# Patient Record
Sex: Male | Born: 1950
Health system: Southern US, Community
[De-identification: ages and names within clinical notes are randomized; demographics above are authoritative.]

## PROBLEM LIST (undated history)

## (undated) DIAGNOSIS — I639 Cerebral infarction, unspecified: Secondary | ICD-10-CM

## (undated) DIAGNOSIS — I4892 Unspecified atrial flutter: Secondary | ICD-10-CM

## (undated) DIAGNOSIS — I5042 Chronic combined systolic (congestive) and diastolic (congestive) heart failure: Secondary | ICD-10-CM

## (undated) DIAGNOSIS — I428 Other cardiomyopathies: Secondary | ICD-10-CM

## (undated) DIAGNOSIS — I10 Essential (primary) hypertension: Secondary | ICD-10-CM

## (undated) DIAGNOSIS — I480 Paroxysmal atrial fibrillation: Secondary | ICD-10-CM

## (undated) DIAGNOSIS — Z9114 Patient's other noncompliance with medication regimen: Secondary | ICD-10-CM

## (undated) DIAGNOSIS — Z91148 Patient's other noncompliance with medication regimen for other reason: Secondary | ICD-10-CM

## (undated) DIAGNOSIS — I272 Pulmonary hypertension, unspecified: Secondary | ICD-10-CM

## (undated) DIAGNOSIS — K922 Gastrointestinal hemorrhage, unspecified: Secondary | ICD-10-CM

## (undated) DIAGNOSIS — N183 Chronic kidney disease, stage 3 unspecified: Secondary | ICD-10-CM

## (undated) HISTORY — DX: Gastrointestinal hemorrhage, unspecified: K92.2

## (undated) HISTORY — PX: CORONARY ANGIOPLASTY: SHX604

## (undated) HISTORY — PX: CARDIAC CATHETERIZATION: SHX172

## (undated) HISTORY — DX: Chronic kidney disease, stage 3 unspecified: N18.30

---

## 2017-04-29 ENCOUNTER — Encounter: Payer: Self-pay | Admitting: Emergency Medicine

## 2017-04-29 ENCOUNTER — Inpatient Hospital Stay
Admission: EM | Admit: 2017-04-29 | Discharge: 2017-05-01 | DRG: 308 | Disposition: A | Discharge status: Home / Self Care | Payer: Medicare HMO | Attending: Internal Medicine | Admitting: Internal Medicine

## 2017-04-29 ENCOUNTER — Other Ambulatory Visit: Payer: Self-pay

## 2017-04-29 ENCOUNTER — Emergency Department: Payer: Medicare HMO

## 2017-04-29 DIAGNOSIS — R946 Abnormal results of thyroid function studies: Secondary | ICD-10-CM | POA: Diagnosis present

## 2017-04-29 DIAGNOSIS — R7989 Other specified abnormal findings of blood chemistry: Secondary | ICD-10-CM | POA: Diagnosis present

## 2017-04-29 DIAGNOSIS — I2510 Atherosclerotic heart disease of native coronary artery without angina pectoris: Secondary | ICD-10-CM | POA: Diagnosis present

## 2017-04-29 DIAGNOSIS — I110 Hypertensive heart disease with heart failure: Secondary | ICD-10-CM | POA: Diagnosis not present

## 2017-04-29 DIAGNOSIS — R7303 Prediabetes: Secondary | ICD-10-CM | POA: Diagnosis present

## 2017-04-29 DIAGNOSIS — I428 Other cardiomyopathies: Secondary | ICD-10-CM | POA: Diagnosis present

## 2017-04-29 DIAGNOSIS — I440 Atrioventricular block, first degree: Secondary | ICD-10-CM | POA: Diagnosis present

## 2017-04-29 DIAGNOSIS — R69 Illness, unspecified: Secondary | ICD-10-CM | POA: Diagnosis not present

## 2017-04-29 DIAGNOSIS — I4892 Unspecified atrial flutter: Principal | ICD-10-CM | POA: Diagnosis present

## 2017-04-29 DIAGNOSIS — I272 Pulmonary hypertension, unspecified: Secondary | ICD-10-CM | POA: Diagnosis not present

## 2017-04-29 DIAGNOSIS — Z87891 Personal history of nicotine dependence: Secondary | ICD-10-CM

## 2017-04-29 DIAGNOSIS — I420 Dilated cardiomyopathy: Secondary | ICD-10-CM | POA: Diagnosis not present

## 2017-04-29 DIAGNOSIS — R0602 Shortness of breath: Secondary | ICD-10-CM | POA: Diagnosis not present

## 2017-04-29 DIAGNOSIS — I5021 Acute systolic (congestive) heart failure: Secondary | ICD-10-CM | POA: Diagnosis present

## 2017-04-29 DIAGNOSIS — N179 Acute kidney failure, unspecified: Secondary | ICD-10-CM | POA: Diagnosis not present

## 2017-04-29 DIAGNOSIS — I361 Nonrheumatic tricuspid (valve) insufficiency: Secondary | ICD-10-CM | POA: Diagnosis not present

## 2017-04-29 DIAGNOSIS — I248 Other forms of acute ischemic heart disease: Secondary | ICD-10-CM | POA: Diagnosis present

## 2017-04-29 DIAGNOSIS — R778 Other specified abnormalities of plasma proteins: Secondary | ICD-10-CM | POA: Diagnosis present

## 2017-04-29 DIAGNOSIS — F17200 Nicotine dependence, unspecified, uncomplicated: Secondary | ICD-10-CM | POA: Diagnosis not present

## 2017-04-29 DIAGNOSIS — R748 Abnormal levels of other serum enzymes: Secondary | ICD-10-CM | POA: Diagnosis not present

## 2017-04-29 DIAGNOSIS — N189 Chronic kidney disease, unspecified: Secondary | ICD-10-CM | POA: Diagnosis not present

## 2017-04-29 DIAGNOSIS — R0603 Acute respiratory distress: Secondary | ICD-10-CM | POA: Diagnosis not present

## 2017-04-29 DIAGNOSIS — R079 Chest pain, unspecified: Secondary | ICD-10-CM | POA: Diagnosis not present

## 2017-04-29 DIAGNOSIS — Z720 Tobacco use: Secondary | ICD-10-CM | POA: Diagnosis not present

## 2017-04-29 HISTORY — DX: Unspecified atrial flutter: I48.92

## 2017-04-29 LAB — GLUCOSE, CAPILLARY: Glucose-Capillary: 158 mg/dL — ABNORMAL HIGH (ref 65–99)

## 2017-04-29 LAB — COMPREHENSIVE METABOLIC PANEL
ALBUMIN: 3.5 g/dL (ref 3.5–5.0)
ALT: 61 U/L (ref 17–63)
AST: 45 U/L — AB (ref 15–41)
Alkaline Phosphatase: 87 U/L (ref 38–126)
Anion gap: 12 (ref 5–15)
BUN: 28 mg/dL — AB (ref 6–20)
CHLORIDE: 106 mmol/L (ref 101–111)
CO2: 19 mmol/L — AB (ref 22–32)
Calcium: 9.2 mg/dL (ref 8.9–10.3)
Creatinine, Ser: 1.4 mg/dL — ABNORMAL HIGH (ref 0.61–1.24)
GFR calc Af Amer: 59 mL/min — ABNORMAL LOW (ref >60)
GFR calc non Af Amer: 51 mL/min — ABNORMAL LOW (ref >60)
Glucose, Bld: 107 mg/dL — ABNORMAL HIGH (ref 65–99)
POTASSIUM: 4.2 mmol/L (ref 3.5–5.1)
SODIUM: 137 mmol/L (ref 135–145)
Total Bilirubin: 0.8 mg/dL (ref 0.3–1.2)
Total Protein: 7.4 g/dL (ref 6.5–8.1)

## 2017-04-29 LAB — CBC WITH DIFFERENTIAL/PLATELET
BASOS PCT: 1 %
Basophils Absolute: 0.1 10*3/uL (ref 0–0.1)
EOS ABS: 0.1 10*3/uL (ref 0–0.7)
EOS PCT: 1 %
HCT: 46.3 % (ref 40.0–52.0)
Hemoglobin: 15.8 g/dL (ref 13.0–18.0)
LYMPHS ABS: 1.7 10*3/uL (ref 1.0–3.6)
Lymphocytes Relative: 35 %
MCH: 30.8 pg (ref 26.0–34.0)
MCHC: 34.1 g/dL (ref 32.0–36.0)
MCV: 90.4 fL (ref 80.0–100.0)
MONO ABS: 0.8 10*3/uL (ref 0.2–1.0)
MONOS PCT: 17 %
Neutro Abs: 2.3 10*3/uL (ref 1.4–6.5)
Neutrophils Relative %: 46 %
Platelets: 257 10*3/uL (ref 150–440)
RBC: 5.12 MIL/uL (ref 4.40–5.90)
RDW: 15.2 % — ABNORMAL HIGH (ref 11.5–14.5)
WBC: 4.9 10*3/uL (ref 3.8–10.6)

## 2017-04-29 LAB — BRAIN NATRIURETIC PEPTIDE: B NATRIURETIC PEPTIDE 5: 1558 pg/mL — AB (ref 0.0–100.0)

## 2017-04-29 LAB — TROPONIN I: TROPONIN I: 1.5 ng/mL — AB (ref <0.03)

## 2017-04-29 MED ORDER — DEXTROSE 5 % IV SOLN
5.0000 mg/h | INTRAVENOUS | Status: DC
  Administered 2017-04-30: 15 mg/h via INTRAVENOUS
  Filled 2017-04-29: qty 100

## 2017-04-29 MED ORDER — DILTIAZEM HCL 25 MG/5ML IV SOLN
5.0000 mg | Freq: Once | INTRAVENOUS | Status: AC
  Administered 2017-04-29: 5 mg via INTRAVENOUS

## 2017-04-29 MED ORDER — HEPARIN (PORCINE) IN NACL 100-0.45 UNIT/ML-% IJ SOLN
1200.0000 [IU]/h | INTRAMUSCULAR | Status: DC
  Administered 2017-04-30: 1200 [IU]/h via INTRAVENOUS
  Filled 2017-04-29 (×2): qty 250

## 2017-04-29 MED ORDER — ENOXAPARIN SODIUM 40 MG/0.4ML ~~LOC~~ SOLN: 40.0000 mg | SUBCUTANEOUS | Status: DC

## 2017-04-29 MED ORDER — ADENOSINE 6 MG/2ML IV SOLN
6.0000 mg | Freq: Once | INTRAVENOUS | Status: AC
  Administered 2017-04-29: 6 mg via INTRAVENOUS
  Filled 2017-04-29: qty 2

## 2017-04-29 MED ORDER — FUROSEMIDE 10 MG/ML IJ SOLN: 40.0000 mg | Freq: Once | INTRAMUSCULAR | Status: DC

## 2017-04-29 MED ORDER — AMIODARONE HCL IN DEXTROSE 360-4.14 MG/200ML-% IV SOLN
30.0000 mg/h | INTRAVENOUS | Status: DC
  Administered 2017-04-30: 30 mg/h via INTRAVENOUS

## 2017-04-29 MED ORDER — HEPARIN BOLUS VIA INFUSION
4000.0000 [IU] | Freq: Once | INTRAVENOUS | Status: DC
  Filled 2017-04-29: qty 4000

## 2017-04-29 MED ORDER — ACETAMINOPHEN 650 MG RE SUPP: 650.0000 mg | Freq: Four times a day (QID) | RECTAL | Status: DC | PRN

## 2017-04-29 MED ORDER — HEPARIN BOLUS VIA INFUSION
2000.0000 [IU] | Freq: Once | INTRAVENOUS | Status: DC
  Filled 2017-04-29: qty 2000

## 2017-04-29 MED ORDER — AMIODARONE HCL IN DEXTROSE 360-4.14 MG/200ML-% IV SOLN
60.0000 mg/h | INTRAVENOUS | Status: DC
  Administered 2017-04-29 – 2017-04-30 (×2): 60 mg/h via INTRAVENOUS
  Filled 2017-04-29 (×2): qty 200

## 2017-04-29 MED ORDER — AMIODARONE LOAD VIA INFUSION
150.0000 mg | Freq: Once | INTRAVENOUS | Status: AC
  Administered 2017-04-29: 150 mg via INTRAVENOUS
  Filled 2017-04-29: qty 83.34

## 2017-04-29 MED ORDER — ACETAMINOPHEN 325 MG PO TABS: 650.0000 mg | ORAL_TABLET | Freq: Four times a day (QID) | ORAL | Status: DC | PRN

## 2017-04-29 MED ORDER — ONDANSETRON HCL 4 MG/2ML IJ SOLN: 4.0000 mg | Freq: Four times a day (QID) | INTRAMUSCULAR | Status: DC | PRN

## 2017-04-29 MED ORDER — DILTIAZEM HCL 25 MG/5ML IV SOLN
INTRAVENOUS | Status: AC
  Filled 2017-04-29: qty 5

## 2017-04-29 MED ORDER — DILTIAZEM HCL 100 MG IV SOLR
5.0000 mg/h | Freq: Once | INTRAVENOUS | Status: AC
  Administered 2017-04-29: 5 mg/h via INTRAVENOUS
  Filled 2017-04-29: qty 100

## 2017-04-29 MED ORDER — ONDANSETRON HCL 4 MG PO TABS: 4.0000 mg | ORAL_TABLET | Freq: Four times a day (QID) | ORAL | Status: DC | PRN

## 2017-04-29 NOTE — ED Notes (Signed)
amiodarone given to floor RN to administer

## 2017-04-29 NOTE — ED Notes (Signed)
Date and time results received: 04/29/17 Test:Troponin Critical Value:1.50  Name of Provider Notified: Jannifer Franklin  Orders Received? Or Actions Taken?: MD notified

## 2017-04-29 NOTE — ED Notes (Addendum)
Lab called to state redraw necessary for troponin

## 2017-04-29 NOTE — ED Notes (Signed)
Lab contacted due to time delay regarding troponin resulting, lab states need another redraw, RN requested lab collect redraw

## 2017-04-29 NOTE — ED Notes (Signed)
MD requested increase of diltiazem to 15 mg/hr

## 2017-04-29 NOTE — Consult Note (Signed)
Name: Angelica Wix. MRN: [id number] DOB: 1950-09-07    ADMISSION DATE:  04/29/2017 CONSULTATION DATE: 04/29/2017  REFERRING MD : Dr. Jannifer Franklin   CHIEF COMPLAINT: Shortness of Breath   BRIEF PATIENT DESCRIPTION:  67 yo male admitted with elevated troponin and new onset atrial flutter requiring amiodarone, cardizem, and heparin gtts  SIGNIFICANT EVENTS  02/12-Pt admitted to stepdown unit   STUDIES:  None   HISTORY OF PRESENT ILLNESS:   This is a 67 yo male with PMH of ETOH Abuse (drinks 1-3 forty ounces of beer daily) and Former Smoker he stopped smoking 1 week prior to presentation to the ER.  According to pt he does not have a PCP, his last examination was during childhood.   He presented to [facility] ER 02/12 with c/o worsening shortness of breath with mild anterior chest soreness onset of symptoms 1 week ago.  Per ER notes the pt states while ambulating short distances he developed shortness of breath.  In the ER EKG revealed atrial flutter with hr 144 and slight left axis nonspecific ST-T wave changes, CXR revealed possible mild pulmonary venous congestion, and BNP 1,558. He received 5 mg iv diltiazem x3 doses and 6 mg adenosine x1 dose without improvement of heart rate, therefore cardizem gtt initiated.  However, despite interventions heart rate remained elevated and amiodarone gtt added. Lab results revealed creatinine 1.40, troponin 1.50, and BNP 1,558 therefore heparin gtt ordered. He was subsequently admitted to the stepdown unit by hospitalist team for further workup and treatment.    PAST MEDICAL HISTORY :   has a past medical history of Atrial flutter ([place]).  has no past surgical history on file. Prior to Admission medications   Not on File   No Known Allergies  FAMILY HISTORY:  family history is not on file. SOCIAL HISTORY:  reports that he has quit smoking. he has never used smokeless tobacco. He reports that he does not drink alcohol.  REVIEW OF SYSTEMS: Positives in BOLD   Constitutional: Negative for fever, chills, weight loss, malaise/fatigue and diaphoresis.  HENT: Negative for hearing loss, ear pain, nosebleeds, congestion, sore throat, neck pain, tinnitus and ear discharge.   Eyes: Negative for blurred vision, double vision, photophobia, pain, discharge and redness.  Respiratory: cough, hemoptysis, sputum production, exertional shortness of breath, wheezing and stridor.   Cardiovascular: intermittent chest pain, palpitations, orthopnea, claudication, leg swelling and PND.  Gastrointestinal: Negative for heartburn, nausea, vomiting, abdominal pain, diarrhea, constipation, blood in stool and melena.  Genitourinary: Negative for dysuria, urgency, frequency, hematuria and flank pain.  Musculoskeletal: Negative for myalgias, back pain, joint pain and falls.  Skin: Negative for itching and rash.  Neurological: dizziness, tingling, tremors, sensory change, speech change, focal weakness, seizures, loss of consciousness, weakness and headaches.  Endo/Heme/Allergies: Negative for environmental allergies and polydipsia. Does not bruise/bleed easily.  SUBJECTIVE:  No complaints at this time   VITAL SIGNS: Temp:  [97.8 F (36.6 C)] 97.8 F (36.6 C) (02/12 1814) Pulse Rate:  [131-144] 132 (02/12 2110) Resp:  [17-28] 17 (02/12 2140) BP: (138-171)/(104-149) 153/104 (02/12 2140) SpO2:  [95 %-99 %] 98 % (02/12 2110) Weight:  [106.6 kg (235 lb)] 106.6 kg (235 lb) (02/12 1812)  PHYSICAL EXAMINATION: General: well developed, well nourished male NAD  Neuro: alert and oriented, follows commands  HEENT: supple, no JVD  Cardiovascular: atrial flutter, no M/R/G Lungs: clear throughout, even, non labored  Abdomen: +BS x4, soft, non tender, non distended  Musculoskeletal: normal bulk and tone, no edema  Skin: intact no rashes or lesions   No results for input(s): NA, K, CL, CO2, BUN, CREATININE, GLUCOSE in the last 168 hours. Recent Labs  Lab 04/29/17 1837  HGB 15.8    HCT 46.3  WBC 4.9  PLT 257   Dg Chest Portable 1 View  Result Date: 04/29/2017 CLINICAL DATA:  Exertional shortness of breath with chest pain. EXAM: PORTABLE CHEST 1 VIEW COMPARISON:  None. FINDINGS: Multiple leads, wires, and external pacer/defibrillator projecting over the chest. Patient rotated minimally right. Midline trachea. Moderate cardiomegaly. No pleural effusion or pneumothorax. Low lung volumes with resultant pulmonary interstitial prominence. Suspect concurrent mild pulmonary venous congestion. No overt congestive failure or pulmonary consolidation. IMPRESSION: Cardiomegaly and low lung volumes. Suspect mild pulmonary venous congestion. Decreased sensitivity and specificity exam due to technique related factors, as described above. Electronically Signed   By: Abigail Miyamoto M.D.   On: 04/29/2017 18:51    ASSESSMENT / PLAN: New Onset Atrial Flutter  Elevated troponin's secondary to demand ischemia vs. NSTEMI  Acute respiratory failure likely in the setting of atrial flutter Acute renal failure  Hypertension  Hx: ETOH Abuse and Former Smoker  P: Supplemental O2 for hypoxia and/or dyspnea  Prn CXR  Prn bronchodilator therapy  Continuous telemetry monitoring  Trend troponin's Echo pending  Continue heparin, amiodarone, and cardizem gtts  Cardiology consulted appreciate input Prn hydralazine for bp management  Trend CBC  Monitor for s/sx of bleeding and transfuse for hgb <7 Trend BMP  Replace electrolytes as indicated  Monitor UOP CIWA protocol  Continue folic acid, mvi, and thiamine   Marda Stalker, [place] Pager [phone] (please enter 7 digits) PCCM Consult Pager [phone] (please enter 7 digits)

## 2017-04-29 NOTE — ED Triage Notes (Signed)
States he stopped smoking about 1 week ago  Since has had exertional SOB with occasional  chest pain  No fever or cough

## 2017-04-29 NOTE — ED Provider Notes (Signed)
[facility] Emergency Department Provider Note   ____________________________________________   First MD Initiated Contact with Patient 04/29/17 1831     (approximate)  I have reviewed the triage vital signs and the nursing notes.   HISTORY  Chief Complaint Shortness of Breath and Pleurisy    HPI Nathan [name]. is a 67 y.o. male Patient reports she stopped smoking a week ago and since then has been having increasing amounts of shortness of breath. He has occasional sore episodes one or 2 seconds worth of minimal chest discomfort in his chest anteriorly but that said. He says he can't walk very far without getting very short of breath however. He has no fever no cough no achiness   History reviewed. No pertinent past medical history.  There are no active problems to display for this patient.   History reviewed. No pertinent surgical history.  Prior to Admission medications   Not on File    Allergies Patient has no known allergies.  History reviewed. No pertinent family history.  Social History Social History   Tobacco Use  . Smoking status: Former Research scientist (life sciences)  . Smokeless tobacco: Never Used  Substance Use Topics  . Alcohol use: No    Frequency: Never  . Drug use: Not on file    Review of Systems  Constitutional: No fever/chills Eyes: No visual changes. ENT: No sore throat. Cardiovascular: Dsee history of present illness Respiratory: see history of present illness Gastrointestinal: No abdominal pain.  No nausea, no vomiting.  No diarrhea.  No constipation. Genitourinary: Negative for dysuria. Musculoskeletal: Negative for back pain. Skin: Negative for rash. Neurological: Negative for headaches, focal weakness  ____________________________________________   PHYSICAL EXAM:  VITAL SIGNS: ED Triage Vitals  Enc Vitals Group     BP 04/29/17 1814 (!) 156/109     Pulse Rate 04/29/17 1814 (!) 144     Resp 04/29/17 1814 20     Temp  04/29/17 1814 97.8 F (36.6 C)     Temp src --      SpO2 04/29/17 1814 98 %     Weight 04/29/17 1812 235 lb (106.6 kg)     Height 04/29/17 1812 6\' 5"  (1.956 m)     Head Circumference --      Peak Flow --      Pain Score --      Pain Loc --      Pain Edu? --      Excl. in [place]? --     Constitutional: Alert and oriented. Well appearing and in no acute distress. Eyes: Conjunctivae are normalI. Head: Atraumatic. Nose: No congestion/rhinnorhea. Mouth/Throat: Mucous membranes are moist.  Oropharynx non-erythematous. Neck: No stridor.   Cardiovascular: rapid rate, regular rhythm. Grossly normal heart sounds.  Good peripheral circulation. Respiratory: Normal respiratory effort.  No retractions. Lungs CTAB. Gastrointestinal: Soft and nontender. No distention. No abdominal bruits. No CVA tenderness. Musculoskeletal: No lower extremity tenderness nor edema.  No joint effusions. Neurologic:  Normal speech and language. No gross focal neurologic deficits are appreciated. No gait instability. Skin:  Skin is warm, dry and intact. No rash noted. Psychiatric: Mood and affect are normal. Speech and behavior are normal.  ____________________________________________   LABS (all labs ordered are listed, but only abnormal results are displayed)  Labs Reviewed  CBC WITH DIFFERENTIAL/PLATELET - Abnormal; Notable for the following components:      Result Value   RDW 15.2 (*)    All other components within normal limits  LACTIC  ACID, PLASMA  LACTIC ACID, PLASMA  BRAIN NATRIURETIC PEPTIDE   ____________________________________________  EKG  EKG read and interpreted by me shows what appears to be a flutter at a rate of 144 slight left axis nonspecific ST-T wave changes ____________________________________________  RADIOLOGY  ED MD interpretation: chest x-ray possible CHF   Official radiology report(s): Dg Chest Portable 1 View  Result Date: 04/29/2017 CLINICAL DATA:  Exertional shortness of  breath with chest pain. EXAM: PORTABLE CHEST 1 VIEW COMPARISON:  None. FINDINGS: Multiple leads, wires, and external pacer/defibrillator projecting over the chest. Patient rotated minimally right. Midline trachea. Moderate cardiomegaly. No pleural effusion or pneumothorax. Low lung volumes with resultant pulmonary interstitial prominence. Suspect concurrent mild pulmonary venous congestion. No overt congestive failure or pulmonary consolidation. IMPRESSION: Cardiomegaly and low lung volumes. Suspect mild pulmonary venous congestion. Decreased sensitivity and specificity exam due to technique related factors, as described above. Electronically Signed   By: Abigail Miyamoto M.D.   On: 04/29/2017 18:51    ____________________________________________   PROCEDURES  Procedure(s) performed:   Procedures  Critical Care performed:   ____________________________________________   INITIAL IMPRESSION / ASSESSMENT AND PLAN / ED COURSE   patient has had 4 doses of 5 mg diltiazem and rapid succession with really no marked changes heart rate. We'll put him on a drip. Lab work is still pending at this point.        ____________________________________________   FINAL CLINICAL IMPRESSION(S) / ED DIAGNOSES  Final diagnoses:  Atrial flutter, unspecified type [facility])     ED Discharge Orders    None       Note:  This document was prepared using Dragon voice recognition software and may include unintentional dictation errors.    Nena Polio, MD 04/29/17 2046

## 2017-04-29 NOTE — H&P (Signed)
[facility] [place] NAME: Nathan [name]    MR#:  [id number]  DATE OF BIRTH:  1950/12/25  DATE OF ADMISSION:  04/29/2017  PRIMARY CARE PHYSICIAN: Patient, [name]   REQUESTING/REFERRING PHYSICIAN: Cinda Quest, MD  CHIEF COMPLAINT:   Chief Complaint  Patient presents with  . Shortness of Breath  . Pleurisy    HISTORY OF PRESENT ILLNESS:  Nathan [name]  is a 67 y.o. male who presents with 1 week of dyspnea on exertion and palpitations.  Here in the ED he was found to be in a flutter with RVR.  He was given several doses of Cardizem and put on a Cardizem drip.  Hospitalist were called for admission  PAST MEDICAL HISTORY:   Past Medical History:  Diagnosis Date  . Atrial flutter ([place])     PAST SURGICAL HISTORY:  History reviewed. No pertinent surgical history.  SOCIAL HISTORY:   Social History   Tobacco Use  . Smoking status: Former Research scientist (life sciences)  . Smokeless tobacco: Never Used  Substance Use Topics  . Alcohol use: No    Frequency: Never    FAMILY HISTORY:  History reviewed. No pertinent family history.  DRUG ALLERGIES:  No Known Allergies  MEDICATIONS AT HOME:   Prior to Admission medications   Not on File    REVIEW OF SYSTEMS:  Review of Systems  Constitutional: Negative for chills, fever, malaise/fatigue and weight loss.  HENT: Negative for ear pain, hearing loss and tinnitus.   Eyes: Negative for blurred vision, double vision, pain and redness.  Respiratory: Positive for shortness of breath. Negative for cough and hemoptysis.   Cardiovascular: Positive for palpitations. Negative for chest pain, orthopnea and leg swelling.  Gastrointestinal: Negative for abdominal pain, constipation, diarrhea, nausea and vomiting.  Genitourinary: Negative for dysuria, frequency and hematuria.  Musculoskeletal: Negative for back pain, joint pain and neck pain.  Skin:       No acne, rash, or lesions  Neurological: Negative for  dizziness, tremors, focal weakness and weakness.  Endo/Heme/Allergies: Negative for polydipsia. Does not bruise/bleed easily.  Psychiatric/Behavioral: Negative for depression. The patient is not nervous/anxious and does not have insomnia.      VITAL SIGNS:   Vitals:   04/29/17 2020 04/29/17 2035 04/29/17 2040 04/29/17 2050  BP: (!) 148/119 (!) 171/128 (!) 164/132 (!) 165/120  Pulse: (!) 133     Resp: (!) 21 (!) 22 (!) 25 20  Temp:      SpO2: 99%     Weight:      Height:       Wt Readings from Last 3 Encounters:  04/29/17 106.6 kg (235 lb)    PHYSICAL EXAMINATION:  Physical Exam  Vitals reviewed. Constitutional: He is oriented to person, place, and time. He appears well-developed and well-nourished. No distress.  HENT:  Head: Normocephalic and atraumatic.  Mouth/Throat: Oropharynx is clear and moist.  Eyes: Conjunctivae and EOM are normal. Pupils are equal, round, and reactive to light. No scleral icterus.  Neck: Normal range of motion. Neck supple. No JVD present. No thyromegaly present.  Cardiovascular: Intact distal pulses. Exam reveals no gallop and no friction rub.  No murmur heard. Tachycardic, irregular  Respiratory: Effort normal and breath sounds normal. No respiratory distress. He has no wheezes. He has no rales.  GI: Soft. Bowel sounds are normal. He exhibits no distension. There is no tenderness.  Musculoskeletal: Normal range of motion. He exhibits no edema.  No arthritis, no  gout  Lymphadenopathy:    He has no cervical adenopathy.  Neurological: He is alert and oriented to person, place, and time. No cranial nerve deficit.  No dysarthria, no aphasia  Skin: Skin is warm and dry. No rash noted. No erythema.  Psychiatric: He has a normal mood and affect. His behavior is normal. Judgment and thought content normal.    LABORATORY PANEL:   CBC Recent Labs  Lab 04/29/17 1837  WBC 4.9  HGB 15.8  HCT 46.3  PLT 257    ------------------------------------------------------------------------------------------------------------------  Chemistries  No results for input(s): NA, K, CL, CO2, GLUCOSE, BUN, CREATININE, CALCIUM, MG, AST, ALT, ALKPHOS, BILITOT in the last 168 hours.  Invalid input(s): GFRCGP ------------------------------------------------------------------------------------------------------------------  Cardiac Enzymes No results for input(s): TROPONINI in the last 168 hours. ------------------------------------------------------------------------------------------------------------------  RADIOLOGY:  Dg Chest Portable 1 View  Result Date: 04/29/2017 CLINICAL DATA:  Exertional shortness of breath with chest pain. EXAM: PORTABLE CHEST 1 VIEW COMPARISON:  None. FINDINGS: Multiple leads, wires, and external pacer/defibrillator projecting over the chest. Patient rotated minimally right. Midline trachea. Moderate cardiomegaly. No pleural effusion or pneumothorax. Low lung volumes with resultant pulmonary interstitial prominence. Suspect concurrent mild pulmonary venous congestion. No overt congestive failure or pulmonary consolidation. IMPRESSION: Cardiomegaly and low lung volumes. Suspect mild pulmonary venous congestion. Decreased sensitivity and specificity exam due to technique related factors, as described above. Electronically Signed   By: Abigail Miyamoto M.D.   On: 04/29/2017 18:51    EKG:   Orders placed or performed during the hospital encounter of 04/29/17  . ED EKG  . ED EKG  . ED EKG  . ED EKG    IMPRESSION AND PLAN:  Principal Problem:   Atrial flutter with rapid ventricular response (HCC) -patient was given several doses of IV Cardizem, then started on a Cardizem drip.  His heart rate still did not control sufficiently, remaining consistently in the 130s on the highest dose of IV Cardizem.  Amiodarone drip was added.  We will also trend his cardiac enzymes, get cardiology consult and  echocardiogram Active Problems:   Elevated troponin -initial troponin was elevated greater than 1.  Is possibly due to persistent a flutter over an unknown number of days, however we will start him on a heparin drip tonight until we can trend his enzymes and get a cardiology consult and echocardiogram   AKI (acute kidney injury) ([place]) -unclear baseline, creatinine is elevated.  Patient reports no prior history of any significant medical problems.  Gentle IV fluids, avoid nephrotoxins, monitor for improvement.  All the records are reviewed and case discussed with ED provider. Management plans discussed with the patient and/or family.  DVT PROPHYLAXIS: SubQ lovenox  GI PROPHYLAXIS: None  ADMISSION STATUS: Inpatient  CODE STATUS: Full Code Status History    This patient does not have a recorded code status. Please follow your organizational policy for patients in this situation.      TOTAL TIME TAKING CARE OF THIS PATIENT: 45 minutes.   Nathan [name] 04/29/2017, 9:16 PM  [employer]  [phone]  CC: Primary care physician; Patient, [name]  Note:  This document was prepared using Dragon voice recognition software and may include unintentional dictation errors.

## 2017-04-29 NOTE — ED Notes (Signed)
Amiodarone given to floor RN to administer

## 2017-04-29 NOTE — ED Notes (Signed)
Attempted to call report, nurse not ready, states receiving RN will call back

## 2017-04-29 NOTE — ED Notes (Signed)
zole pads placed on pt

## 2017-04-30 ENCOUNTER — Other Ambulatory Visit: Payer: Self-pay

## 2017-04-30 ENCOUNTER — Inpatient Hospital Stay (HOSPITAL_COMMUNITY)
Admit: 2017-04-30 | Discharge: 2017-04-30 | Disposition: A | Discharge status: Home / Self Care | Payer: Medicare HMO | Attending: Physician Assistant | Admitting: Physician Assistant

## 2017-04-30 ENCOUNTER — Encounter: Payer: Self-pay | Admitting: Physician Assistant

## 2017-04-30 DIAGNOSIS — R748 Abnormal levels of other serum enzymes: Secondary | ICD-10-CM

## 2017-04-30 DIAGNOSIS — I248 Other forms of acute ischemic heart disease: Secondary | ICD-10-CM

## 2017-04-30 DIAGNOSIS — F17200 Nicotine dependence, unspecified, uncomplicated: Secondary | ICD-10-CM

## 2017-04-30 DIAGNOSIS — I361 Nonrheumatic tricuspid (valve) insufficiency: Secondary | ICD-10-CM

## 2017-04-30 DIAGNOSIS — R0603 Acute respiratory distress: Secondary | ICD-10-CM

## 2017-04-30 DIAGNOSIS — N179 Acute kidney failure, unspecified: Secondary | ICD-10-CM

## 2017-04-30 DIAGNOSIS — I4892 Unspecified atrial flutter: Principal | ICD-10-CM

## 2017-04-30 LAB — CBC
HCT: 42.1 % (ref 40.0–52.0)
HCT: 42.2 % (ref 40.0–52.0)
HEMOGLOBIN: 14.3 g/dL (ref 13.0–18.0)
HEMOGLOBIN: 14.5 g/dL (ref 13.0–18.0)
MCH: 31 pg (ref 26.0–34.0)
MCH: 31 pg (ref 26.0–34.0)
MCHC: 33.9 g/dL (ref 32.0–36.0)
MCHC: 34.3 g/dL (ref 32.0–36.0)
MCV: 90.3 fL (ref 80.0–100.0)
MCV: 91.3 fL (ref 80.0–100.0)
Platelets: 220 10*3/uL (ref 150–440)
Platelets: 232 10*3/uL (ref 150–440)
RBC: 4.63 MIL/uL (ref 4.40–5.90)
RBC: 4.67 MIL/uL (ref 4.40–5.90)
RDW: 15.5 % — AB (ref 11.5–14.5)
RDW: 15.5 % — ABNORMAL HIGH (ref 11.5–14.5)
WBC: 4.7 10*3/uL (ref 3.8–10.6)
WBC: 4.9 10*3/uL (ref 3.8–10.6)

## 2017-04-30 LAB — BASIC METABOLIC PANEL
ANION GAP: 9 (ref 5–15)
BUN: 25 mg/dL — ABNORMAL HIGH (ref 6–20)
CALCIUM: 9.2 mg/dL (ref 8.9–10.3)
CO2: 21 mmol/L — ABNORMAL LOW (ref 22–32)
Chloride: 110 mmol/L (ref 101–111)
Creatinine, Ser: 1.23 mg/dL (ref 0.61–1.24)
GFR, EST NON AFRICAN AMERICAN: 59 mL/min — AB (ref >60)
GLUCOSE: 105 mg/dL — AB (ref 65–99)
Potassium: 4.1 mmol/L (ref 3.5–5.1)
SODIUM: 140 mmol/L (ref 135–145)

## 2017-04-30 LAB — ECHOCARDIOGRAM COMPLETE
Height: 77 in
Weight: 3760.17 oz

## 2017-04-30 LAB — TROPONIN I
TROPONIN I: 1.22 ng/mL — AB (ref <0.03)
Troponin I: 1.12 ng/mL (ref <0.03)
Troponin I: 1.06 ng/mL (ref <0.03)

## 2017-04-30 LAB — APTT: aPTT: 28 seconds (ref 24–36)

## 2017-04-30 LAB — HEMOGLOBIN A1C
Hgb A1c MFr Bld: 5.7 % — ABNORMAL HIGH (ref 4.8–5.6)
MEAN PLASMA GLUCOSE: 116.89 mg/dL

## 2017-04-30 LAB — HEPARIN LEVEL (UNFRACTIONATED)
HEPARIN UNFRACTIONATED: 0.32 [IU]/mL (ref 0.30–0.70)
HEPARIN UNFRACTIONATED: 0.34 [IU]/mL (ref 0.30–0.70)

## 2017-04-30 LAB — TSH: TSH: 7.441 u[IU]/mL — AB (ref 0.350–4.500)

## 2017-04-30 LAB — PROTIME-INR
INR: 1.13
Prothrombin Time: 14.4 seconds (ref 11.4–15.2)

## 2017-04-30 LAB — CREATININE, SERUM
Creatinine, Ser: 1.26 mg/dL — ABNORMAL HIGH (ref 0.61–1.24)
GFR, EST NON AFRICAN AMERICAN: 58 mL/min — AB (ref >60)

## 2017-04-30 LAB — MRSA PCR SCREENING: MRSA BY PCR: NEGATIVE

## 2017-04-30 LAB — MAGNESIUM: Magnesium: 2 mg/dL (ref 1.7–2.4)

## 2017-04-30 MED ORDER — METOPROLOL TARTRATE 25 MG PO TABS: 25.0000 mg | ORAL_TABLET | Freq: Four times a day (QID) | ORAL | Status: DC

## 2017-04-30 MED ORDER — APIXABAN 5 MG PO TABS
5.0000 mg | ORAL_TABLET | Freq: Two times a day (BID) | ORAL | Status: DC
  Administered 2017-04-30 – 2017-05-01 (×3): 5 mg via ORAL
  Filled 2017-04-30 (×3): qty 1

## 2017-04-30 MED ORDER — ADULT MULTIVITAMIN W/MINERALS CH
1.0000 | ORAL_TABLET | Freq: Every day | ORAL | Status: DC
  Administered 2017-04-30 – 2017-05-01 (×2): 1 via ORAL
  Filled 2017-04-30 (×2): qty 1

## 2017-04-30 MED ORDER — LORAZEPAM 2 MG/ML IJ SOLN: 2.0000 mg | INTRAMUSCULAR | Status: DC | PRN

## 2017-04-30 MED ORDER — DIGOXIN 0.25 MG/ML IJ SOLN
0.5000 mg | Freq: Once | INTRAMUSCULAR | Status: AC
  Administered 2017-04-30: 0.5 mg via INTRAVENOUS
  Filled 2017-04-30: qty 2

## 2017-04-30 MED ORDER — HYDRALAZINE HCL 20 MG/ML IJ SOLN
10.0000 mg | INTRAMUSCULAR | Status: DC | PRN
  Administered 2017-04-30 (×3): 10 mg via INTRAVENOUS
  Filled 2017-04-30 (×2): qty 1

## 2017-04-30 MED ORDER — DILTIAZEM HCL 30 MG PO TABS
60.0000 mg | ORAL_TABLET | Freq: Three times a day (TID) | ORAL | Status: DC
  Administered 2017-04-30 – 2017-05-01 (×3): 60 mg via ORAL
  Filled 2017-04-30: qty 2
  Filled 2017-04-30: qty 1
  Filled 2017-04-30: qty 2

## 2017-04-30 MED ORDER — METOPROLOL TARTRATE 50 MG PO TABS
50.0000 mg | ORAL_TABLET | Freq: Four times a day (QID) | ORAL | Status: DC
  Administered 2017-04-30 – 2017-05-01 (×3): 50 mg via ORAL
  Filled 2017-04-30 (×4): qty 1

## 2017-04-30 MED ORDER — FOLIC ACID 1 MG PO TABS
1.0000 mg | ORAL_TABLET | Freq: Every day | ORAL | Status: DC
  Administered 2017-04-30 – 2017-05-01 (×2): 1 mg via ORAL
  Filled 2017-04-30 (×2): qty 1

## 2017-04-30 MED ORDER — IPRATROPIUM-ALBUTEROL 0.5-2.5 (3) MG/3ML IN SOLN: 3.0000 mL | Freq: Four times a day (QID) | RESPIRATORY_TRACT | Status: DC | PRN

## 2017-04-30 MED ORDER — HEPARIN BOLUS VIA INFUSION
4000.0000 [IU] | Freq: Once | INTRAVENOUS | Status: AC
  Administered 2017-04-30: 4000 [IU] via INTRAVENOUS
  Filled 2017-04-30: qty 4000

## 2017-04-30 MED ORDER — VITAMIN B-1 100 MG PO TABS
100.0000 mg | ORAL_TABLET | Freq: Every day | ORAL | Status: DC
  Administered 2017-04-30 – 2017-05-01 (×2): 100 mg via ORAL
  Filled 2017-04-30 (×2): qty 1

## 2017-04-30 NOTE — Progress Notes (Signed)
ANTICOAGULATION CONSULT NOTE - Initial Consult  Pharmacy Consult for eliquis  Indication: atrial flutter with rvr  No Known Allergies  Patient Measurements: Height: 6\' 5"  (195.6 cm) Weight: 235 lb 0.2 oz (106.6 kg) IBW/kg (Calculated) : 89.1 Heparin Dosing Weight:   Vital Signs: Temp: 98.2 F (36.8 C) (02/13 0800) Temp Source: Oral (02/13 0800) BP: 139/83 (02/13 1412) Pulse Rate: 80 (02/13 1412)  Labs: Recent Labs    04/29/17 1837 04/29/17 1941 04/30/17 0003 04/30/17 0503 04/30/17 1100  HGB 15.8  --  14.5 14.3  --   HCT 46.3  --  42.1 42.2  --   PLT 257  --  232 220  --   APTT  --   --  28  --   --   LABPROT  --   --  14.4  --   --   INR  --   --  1.13  --   --   HEPARINUNFRC  --   --   --  0.32 0.34  CREATININE  --  1.40* 1.26* 1.23  --   TROPONINI  --  1.50* 1.22* 1.06*  1.12*  --     Estimated Creatinine Clearance: 74.5 mL/min (by C-G formula based on SCr of 1.23 mg/dL).   Medical History: Past Medical History:  Diagnosis Date  . Atrial flutter (HCC)     Medications:  No medications prior to admission.   Scheduled:  . apixaban  5 mg Oral BID  . diltiazem  60 mg Oral B3P  . folic acid  1 mg Oral Daily  . metoprolol tartrate  50 mg Oral Q6H  . multivitamin with minerals  1 tablet Oral Daily  . thiamine  100 mg Oral Daily    Assessment: Pharmacy consulted to dose and monitor apixaban for this 67 year old male being treated for atrial flutter. Patient previously on heparin gtt however MD Bridgett Larsson would like to transition to Eliquis  Goal of Therapy:   Monitor platelets by anticoagulation protocol: Yes   Plan:  Will discontinue Heparin gtt and will start apixaban 5 mg PO BID.   Deona Novitski D 04/30/2017,3:24 PM

## 2017-04-30 NOTE — Progress Notes (Signed)
[place] Progress Note Patient Name: Nathan [name]. DOB: [birth year] MRN: [id number]   Date of Service  04/30/2017  HPI/Events of Note  58 M presenting to ED with progressive SOB and DOE.  Recently stopped smoking.  Found to be in AF/RVR.  Placed initially on dilt gtt but continued to have AF and placed on Amio gtt.  Cards has been consulted.  Initial trop elevated at 1.5 and BNP elevated with some vascular congestion on CXR.  Currently the paitent is alert on [place] O2.  His HR is 128 with BP of 142/113.  He is in no resp distress  eICU Interventions  Plan of care per primary admitting team Continue to cycle trop Cont with Dilt/Amio gtt Now on heparin Continue to monitor via [facility]     Intervention Category Evaluation Type: New Patient Evaluation  DETERDING,ELIZABETH 04/30/2017, 12:18 AM

## 2017-04-30 NOTE — Progress Notes (Signed)
ANTICOAGULATION CONSULT NOTE - Initial Consult  Pharmacy Consult for heparin Indication: atrial fibrillation/ACS  No Known Allergies  Patient Measurements: Height: 6\' 5"  (195.6 cm) Weight: 235 lb 0.2 oz (106.6 kg) IBW/kg (Calculated) : 89.1 Heparin Dosing Weight: 106 kg  Vital Signs: Temp: 97.5 F (36.4 C) (02/13 0430) Temp Source: Oral (02/13 0430) BP: 144/112 (02/13 0600) Pulse Rate: 122 (02/13 0600)  Labs: Recent Labs    04/29/17 1837 04/29/17 1941 04/30/17 0003 04/30/17 0503  HGB 15.8  --  14.5 14.3  HCT 46.3  --  42.1 42.2  PLT 257  --  232 220  APTT  --   --  28  --   LABPROT  --   --  14.4  --   INR  --   --  1.13  --   HEPARINUNFRC  --   --   --  0.32  CREATININE  --  1.40* 1.26* 1.23  TROPONINI  --  1.50* 1.22* 1.12*    Estimated Creatinine Clearance: 74.5 mL/min (by C-G formula based on SCr of 1.23 mg/dL).   Medical History: Past Medical History:  Diagnosis Date  . Atrial flutter (HCC)     Medications:  Scheduled:  . folic acid  1 mg Oral Daily  . multivitamin with minerals  1 tablet Oral Daily  . thiamine  100 mg Oral Daily    Assessment: Patient admitted w/ CP found trops up to 1.50 being started on heparin drip. No PTA anticoagul  Goal of Therapy:  Heparin level 0.3-0.7 units/ml Monitor platelets by anticoagulation protocol: Yes   Plan:  Will bolus w/ heparin 4000 units IV x 1 Will start rate at 1200 units/hr  Baseline labs drawn Will check HL w/ am labs. Will monitor daily CBC's and adjust per HL's  02/13 @ 0500 HL 0.32 therapeutic. Will continue current rate and will recheck HL @ 1100. CBC stable.  Tobie Lords, PharmD, BCPS Clinical Pharmacist 04/30/2017

## 2017-04-30 NOTE — Progress Notes (Signed)
*  PRELIMINARY RESULTS* Echocardiogram 2D Echocardiogram has been performed.  Sherrie Sport 04/30/2017, 3:14 PM

## 2017-04-30 NOTE — Progress Notes (Signed)
ANTICOAGULATION CONSULT NOTE - Initial Consult  Pharmacy Consult for heparin Indication: atrial fibrillation/ACS  No Known Allergies  Patient Measurements: Height: 6\' 5"  (195.6 cm) Weight: 235 lb 0.2 oz (106.6 kg) IBW/kg (Calculated) : 89.1 Heparin Dosing Weight: 106 kg  Vital Signs: Temp: 97.6 F (36.4 C) (02/12 2337) Temp Source: Oral (02/12 2337) BP: 141/116 (02/13 0000) Pulse Rate: 126 (02/13 0000)  Labs: Recent Labs    04/29/17 1837 04/29/17 1941  HGB 15.8  --   HCT 46.3  --   PLT 257  --   CREATININE  --  1.40*  TROPONINI  --  1.50*    Estimated Creatinine Clearance: 65.4 mL/min (A) (by C-G formula based on SCr of 1.4 mg/dL (H)).   Medical History: Past Medical History:  Diagnosis Date  . Atrial flutter (HCC)     Medications:  Scheduled:  . folic acid  1 mg Oral Daily  . heparin  4,000 Units Intravenous Once  . multivitamin with minerals  1 tablet Oral Daily  . thiamine  100 mg Oral Daily    Assessment: Patient admitted w/ CP found trops up to 1.50 being started on heparin drip. No PTA anticoagul  Goal of Therapy:  Heparin level 0.3-0.7 units/ml Monitor platelets by anticoagulation protocol: Yes   Plan:  Will bolus w/ heparin 4000 units IV x 1 Will start rate at 1200 units/hr  Baseline labs drawn Will check HL w/ am labs. Will monitor daily CBC's and adjust per HL's  Tobie Lords, PharmD, BCPS Clinical Pharmacist 04/30/2017

## 2017-04-30 NOTE — Progress Notes (Signed)
ANTICOAGULATION CONSULT NOTE - Initial Consult  Pharmacy Consult for heparin Indication: atrial fibrillation/ACS  No Known Allergies  Patient Measurements: Height: 6\' 5"  (195.6 cm) Weight: 235 lb 0.2 oz (106.6 kg) IBW/kg (Calculated) : 89.1 Heparin Dosing Weight: 106 kg  Vital Signs: Temp: 98.2 F (36.8 C) (02/13 0800) Temp Source: Oral (02/13 0800) BP: 126/87 (02/13 1100) Pulse Rate: 85 (02/13 1100)  Labs: Recent Labs    04/29/17 1837 04/29/17 1941 04/30/17 0003 04/30/17 0503 04/30/17 1100  HGB 15.8  --  14.5 14.3  --   HCT 46.3  --  42.1 42.2  --   PLT 257  --  232 220  --   APTT  --   --  28  --   --   LABPROT  --   --  14.4  --   --   INR  --   --  1.13  --   --   HEPARINUNFRC  --   --   --  0.32 0.34  CREATININE  --  1.40* 1.26* 1.23  --   TROPONINI  --  1.50* 1.22* 1.06*  1.12*  --     Estimated Creatinine Clearance: 74.5 mL/min (by C-G formula based on SCr of 1.23 mg/dL).   Medical History: Past Medical History:  Diagnosis Date  . Atrial flutter (HCC)     Medications:  Scheduled:  . diltiazem  60 mg Oral J5K  . folic acid  1 mg Oral Daily  . metoprolol tartrate  50 mg Oral Q6H  . multivitamin with minerals  1 tablet Oral Daily  . thiamine  100 mg Oral Daily    Assessment: Patient admitted w/ CP found trops up to 1.50 being started on heparin drip. No PTA anticoagul  Goal of Therapy:  Heparin level 0.3-0.7 units/ml Monitor platelets by anticoagulation protocol: Yes   Plan:  Will bolus w/ heparin 4000 units IV x 1 Will start rate at 1200 units/hr  Baseline labs drawn Will check HL w/ am labs. Will monitor daily CBC's and adjust per HL's  02/13 @ 0500 HL 0.32 therapeutic. Will continue current rate and will recheck HL @ 1100. CBC stable.  2/13: Heparin level 0.34 which is therapeutic. Will recheck in am.  Tobie Lords, PharmD, BCPS Clinical Pharmacist 04/30/2017

## 2017-04-30 NOTE — Progress Notes (Signed)
[facility] [place] NAME: Nathan [name]    MR#:  [id number]  DATE OF BIRTH:  March 30, 1950  SUBJECTIVE:  CHIEF COMPLAINT:   Chief Complaint  Patient presents with  . Shortness of Breath  . Pleurisy   Patient feels better.  No palpitation no shortness of breath.  Off Cardizem and amiodarone drip. REVIEW OF SYSTEMS:  Review of Systems  Constitutional: Negative for chills, fever and malaise/fatigue.  HENT: Negative for sore throat.   Eyes: Negative for blurred vision and double vision.  Respiratory: Negative for cough, hemoptysis, shortness of breath, wheezing and stridor.   Cardiovascular: Negative for chest pain, palpitations, orthopnea and leg swelling.  Gastrointestinal: Negative for abdominal pain, blood in stool, diarrhea, melena, nausea and vomiting.  Genitourinary: Negative for dysuria, flank pain and hematuria.  Musculoskeletal: Negative for back pain and joint pain.  Skin: Negative for rash.  Neurological: Negative for dizziness, sensory change, focal weakness, seizures, loss of consciousness, weakness and headaches.  Endo/Heme/Allergies: Negative for polydipsia.  Psychiatric/Behavioral: Negative for depression. The patient is not nervous/anxious.     DRUG ALLERGIES:  No Known Allergies VITALS:  Blood pressure 139/83, pulse 80, temperature 98.2 F (36.8 C), temperature source Oral, resp. rate 14, height 6\' 5"  (1.956 m), weight 235 lb 0.2 oz (106.6 kg), SpO2 92 %. PHYSICAL EXAMINATION:  Physical Exam  Constitutional: He is oriented to person, place, and time and well-developed, well-nourished, and in no distress.  HENT:  Head: Normocephalic.  Mouth/Throat: Oropharynx is clear and moist.  Eyes: Conjunctivae and EOM are normal. Pupils are equal, round, and reactive to light. No scleral icterus.  Neck: Normal range of motion. Neck supple. No JVD present. No tracheal deviation present.  Cardiovascular: Normal rate, regular rhythm  and normal heart sounds. Exam reveals no gallop.  No murmur heard. Pulmonary/Chest: Effort normal and breath sounds normal. No respiratory distress. He has no wheezes. He has no rales.  Abdominal: Soft. Bowel sounds are normal. He exhibits no distension. There is no tenderness. There is no rebound.  Musculoskeletal: Normal range of motion. He exhibits no edema or tenderness.  Neurological: He is alert and oriented to person, place, and time. No cranial nerve deficit.  Skin: No rash noted. No erythema.  Psychiatric: Affect normal.   LABORATORY PANEL:  Male CBC Recent Labs  Lab 04/30/17 0503  WBC 4.9  HGB 14.3  HCT 42.2  PLT 220   ------------------------------------------------------------------------------------------------------------------ Chemistries  Recent Labs  Lab 04/29/17 1941  04/30/17 0503  NA 137  --  140  K 4.2  --  4.1  CL 106  --  110  CO2 19*  --  21*  GLUCOSE 107*  --  105*  BUN 28*  --  25*  CREATININE 1.40*   < > 1.23  CALCIUM 9.2  --  9.2  MG  --   --  2.0  AST 45*  --   --   ALT 61  --   --   ALKPHOS 87  --   --   BILITOT 0.8  --   --    < > = values in this interval not displayed.   RADIOLOGY:  Dg Chest Portable 1 View  Result Date: 04/29/2017 CLINICAL DATA:  Exertional shortness of breath with chest pain. EXAM: PORTABLE CHEST 1 VIEW COMPARISON:  None. FINDINGS: Multiple leads, wires, and external pacer/defibrillator projecting over the chest. Patient rotated minimally right. Midline trachea. Moderate cardiomegaly. No pleural effusion or  pneumothorax. Low lung volumes with resultant pulmonary interstitial prominence. Suspect concurrent mild pulmonary venous congestion. No overt congestive failure or pulmonary consolidation. IMPRESSION: Cardiomegaly and low lung volumes. Suspect mild pulmonary venous congestion. Decreased sensitivity and specificity exam due to technique related factors, as described above. Electronically Signed   By: Abigail Miyamoto M.D.    On: 04/29/2017 18:51   ASSESSMENT AND PLAN:     Atrial flutter with rapid ventricular response ([place]) -patient was given several doses of IV Cardizem, then started on a Cardizem drip.   He is off Cardizem and miodarone drip.  Heart rate is controlled. Cardiologist started p.o. Cardizem and lopressor, on heparin drip. could give bolus of digoxin for rate control. Eliquis 5 twice daily, follow-up echocardiogram. Plan on DC cardioversion in 1 month if able to rate control this hospitalization per Dr. Rockey Situ.    Elevated troponin -initial troponin was elevated greater than 1.  Is possibly due to persistent a flutter over an unknown number of days. Lexiscan Myoview prior to discharge, am.    AKI (acute kidney injury) ([place]) -unclear baseline, creatinine is elevated.  Patient reports no prior history of any significant medical problems.   Improved with IV fluid support.  Pulmonary edema.  Likely due to a flutter with RVR. Continue Lasix daily.  Tobacco abuse.  Smoking cessation was counseled for 3-4 minutes.  Nicotine patch.  All the records are reviewed and case discussed with Care Management/Social Worker. Management plans discussed with the patient, family and they are in agreement.  CODE STATUS: Full Code  TOTAL TIME TAKING CARE OF THIS PATIENT: 35 minutes.   More than 50% of the time was spent in counseling/coordination of care: YES  POSSIBLE D/C IN 1-2 DAYS, DEPENDING ON CLINICAL CONDITION.   Demetrios Loll M.D on 04/30/2017 at 2:27 PM  Between 7am to 6pm - Pager - [phone]  After 6pm go to [email or web address] - [facility]

## 2017-04-30 NOTE — Consult Note (Signed)
Cardiology Consultation:   Patient ID: Nathan [name].; [id number]; 1950/10/13   Admit date: 04/29/2017 Date of Consult: 04/30/2017  Primary Care Provider: Patient, [name] Primary Cardiologist: new to [facility] - consult by [place]   Patient Profile:   Nathan [name]. is a 67 y.o. male with a hx of tobacco abuse quitting 04/28/17 who is being seen today for the evaluation of new onset atrial flutter with RVR, elevated troponin, and acute CHF at the request of Dr. Jannifer Franklin.  History of Present Illness:   Mr. Weatherholtz has no known previously known cardiac history. He has not seen an MD as an outpatient in many years. Over the past 7-10 days, he has noted increased fatigue with associated SOB. No chest pain or palpitations. No LE swelling. He was assuming this was related to him attempting to quit tobacco over this time span. However, due to his persistent fatigue and SOB he presented to [facility].   Upon the patient's arrival to [facility] they were found to have BP 156/109, HR 144 bpm, temp afebrile, oxygen saturation 98% on room air, weight 235 pounds. EKG showed atrial flutter with RVR as detailed below, CXR showed cardiomegaly with mild congestion. Labs showed troponin 1.50-->1.22-->1.12, BNP 1,558, SCr 1.40-->1.23, K+ 4.2-->4.1, glucose 107, WBC 4.9, HGB 15.8, PLT 257. In the ED he was given adenosine, amiodarone infusion with bolus, Cardizem injections totaling 15 mg followed by diltiazem gtt, and hydralazine. He was also started on a heparin infusion. He has remained in atrial flutter with RVR with heart rates in the 120s bpm. Cardiology was asked to evaluate.     Past Medical History:  Diagnosis Date  . Atrial flutter ([place])     History reviewed. No pertinent surgical history.   Home Meds: Prior to Admission medications   Not on File    Inpatient Medications: Scheduled Meds: . folic acid  1 mg Oral Daily  . multivitamin with minerals  1 tablet Oral Daily  . thiamine  100 mg Oral Daily    Continuous Infusions: . amiodarone 30 mg/hr (04/30/17 0511)  . diltiazem (CARDIZEM) infusion 15 mg/hr (04/30/17 0601)  . heparin 1,200 Units/hr (04/30/17 0009)   PRN Meds: acetaminophen **OR** acetaminophen, hydrALAZINE, ipratropium-albuterol, LORazepam, ondansetron **OR** ondansetron (ZOFRAN) IV  Allergies:  No Known Allergies  Social History:   Social History   Socioeconomic History  . Marital status: Single    Spouse name: Not on file  . Number of children: Not on file  . Years of education: Not on file  . Highest education level: Not on file  Social Needs  . Financial resource strain: Not on file  . Food insecurity - worry: Not on file  . Food insecurity - inability: Not on file  . Transportation needs - medical: Not on file  . Transportation needs - non-medical: Not on file  Occupational History  . Not on file  Tobacco Use  . Smoking status: Former Research scientist (life sciences)  . Smokeless tobacco: Never Used  Substance and Sexual Activity  . Alcohol use: No    Frequency: Never  . Drug use: Not on file  . Sexual activity: Not on file  Other Topics Concern  . Not on file  Social History Narrative  . Not on file     Family History:   Family History  Problem Relation Age of Onset  . Alzheimer's disease Mother   . Alzheimer's disease Father     ROS:  Review of Systems  Constitutional: Positive  for malaise/fatigue. Negative for chills, diaphoresis, fever and weight loss.  HENT: Negative for congestion.   Eyes: Negative for discharge and redness.  Respiratory: Positive for shortness of breath. Negative for cough, hemoptysis, sputum production and wheezing.   Cardiovascular: Negative for chest pain, palpitations, orthopnea, claudication, leg swelling and PND.  Gastrointestinal: Negative for abdominal pain, blood in stool, heartburn, melena, nausea and vomiting.  Genitourinary: Negative for hematuria.  Musculoskeletal: Negative for falls and myalgias.  Skin: Negative for rash.   Neurological: Positive for weakness. Negative for dizziness, tingling, tremors, sensory change, speech change, focal weakness and loss of consciousness.  Endo/Heme/Allergies: Does not bruise/bleed easily.  Psychiatric/Behavioral: Negative for substance abuse. The patient is not nervous/anxious.   All other systems reviewed and are negative.     Physical Exam/Data:   Vitals:   04/30/17 0530 04/30/17 0600 04/30/17 0630 04/30/17 0700  BP: (!) 138/107 (!) 144/112 (!) 141/106 (!) 147/109  Pulse: (!) 124 (!) 122 (!) 124 (!) 122  Resp: (!) 29 13 (!) 29 18  Temp:      TempSrc:      SpO2: 97% 95% 94% 94%  Weight:      Height:        Intake/Output Summary (Last 24 hours) at 04/30/2017 0804 Last data filed at 04/30/2017 0600 Gross per 24 hour  Intake 414.95 ml  Output 825 ml  Net -410.05 ml   Filed Weights   04/29/17 1812 04/29/17 2337  Weight: 235 lb (106.6 kg) 235 lb 0.2 oz (106.6 kg)   Body mass index is 27.87 kg/m.   Physical Exam: General: Well developed, well nourished, in no acute distress. Head: Normocephalic, atraumatic, sclera non-icteric, no xanthomas, nares without discharge. Neck: Negative for carotid bruits. JVD not elevated. Lungs: Clear bilaterally to auscultation without wheezes, rales, or rhonchi. Breathing is unlabored. Heart: Tachycardic with S1 S2. No murmurs, rubs, or gallops appreciated. Abdomen: Soft, non-tender, non-distended with normoactive bowel sounds. No hepatomegaly. No rebound/guarding. No obvious abdominal masses. Msk:  Strength and tone appear normal for age. Extremities: No clubbing or cyanosis. No edema. Distal pedal pulses are 2+ and equal bilaterally. Neuro: Alert and oriented X 3. No facial asymmetry. No focal deficit. Moves all extremities spontaneously. Psych:  Responds to questions appropriately with a normal affect.   EKG:  The EKG was personally reviewed and demonstrates: typical atrial flutter with RVR, 144 bpm, 2:1 AV block,  nonspecific st/t changes Telemetry:  Telemetry was personally reviewed and demonstrates: atrial flutter with RVR, 120s bpm, mostly 2:1 AV block with rare variable AV block   Weights: Filed Weights   04/29/17 1812 04/29/17 2337  Weight: 235 lb (106.6 kg) 235 lb 0.2 oz (106.6 kg)    Relevant CV Studies: TTE pending  Laboratory Data:  Chemistry Recent Labs  Lab 04/29/17 1941 04/30/17 0003 04/30/17 0503  NA 137  --  140  K 4.2  --  4.1  CL 106  --  110  CO2 19*  --  21*  GLUCOSE 107*  --  105*  BUN 28*  --  25*  CREATININE 1.40* 1.26* 1.23  CALCIUM 9.2  --  9.2  GFRNONAA 51* 58* 59*  GFRAA 59* >60 >60  ANIONGAP 12  --  9    Recent Labs  Lab 04/29/17 1941  PROT 7.4  ALBUMIN 3.5  AST 45*  ALT 61  ALKPHOS 87  BILITOT 0.8   Hematology Recent Labs  Lab 04/29/17 1837 04/30/17 0003 04/30/17 0503  WBC 4.9 4.7  4.9  RBC 5.12 4.67 4.63  HGB 15.8 14.5 14.3  HCT 46.3 42.1 42.2  MCV 90.4 90.3 91.3  MCH 30.8 31.0 31.0  MCHC 34.1 34.3 33.9  RDW 15.2* 15.5* 15.5*  PLT 257 232 220   Cardiac Enzymes Recent Labs  Lab 04/29/17 1941 04/30/17 0003 04/30/17 0503  TROPONINI 1.50* 1.22* 1.12*   No results for input(s): TROPIPOC in the last 168 hours.  BNP Recent Labs  Lab 04/29/17 2023  BNP 1,558.0*    DDimer No results for input(s): DDIMER in the last 168 hours.  Radiology/Studies:  Dg Chest Portable 1 View  Result Date: 04/29/2017 IMPRESSION: Cardiomegaly and low lung volumes. Suspect mild pulmonary venous congestion. Decreased sensitivity and specificity exam due to technique related factors, as described above. Electronically Signed   By: Abigail Miyamoto M.D.   On: 04/29/2017 18:51    Assessment and Plan:   1. New onset atrial flutter with RVR: -Of uncertain chronicity -Would ideally like to avoid/discontinue amiodarone given we do not know how long he has been in this rhythm. Stop amiodarone  -Will add metoprolol 50 mg q 6 hours for added rate control -Taper  diltiazem gtt -Start short acting diltiazem 60 mg q 8 hours with hold parameters  -Stop hydralazine to allow for more BP room to rate control -If needed, give a one-time IV digoxin load of 0.5 mg followed by 0.25 PO on 2/14 -Continue heparin gtt -If heart rates remain difficult to control he will require TEE/DCCV prior to discharge -If we can adequate control his heart rates, even with ambulation, he could possibly undergo outpatient DCCV after he has been adequately anticoagulated for at least 3 weeks without interruption  -Check TSH and magnesium with recommendation to replete magnesium to goal > 2.0 as indicated -Potassium at goal -CHADS2VASc at least 2 (CHF, age x 1) -Given his elevated CHADS2VASc, he will require long term, full-dose anticoagulation with DOAC  2. Elevated troponin: -Initial troponin 1.50, down trending -Heparin gtt -Echo pending -Possibly supply demand ischemia in the setting of atrial flutter with RVR -Will need ischemic evaluation once heart rate is better controlled, potentially 2/14 if ventricular rates are better controlled  -Lopressor as above  3. Acute CHF, type unknown: -CXR showed vascular congestion with BNP 1558 -IV Lasix 20 mg daily with KCl repletion -Possibly tachy-mediated in the setting of atrial flutter with RVR -Await echo, would ideally like to defer until ventricular rate is better controlled, if EF is reduced would likely plan for rate control followed by repeat limited echo in ~ 1 month to evaluate for improvement. If EF remains reduced at that time, he will need ischemic evaluation  -Add metoprolol as above -Escalate evidence-based heart failure medications as dictated by echo -CHF education -Daily weights, strict Is and Os  4. AKI: -Improved   5. Hyperglycemia: -Check A1c -Check lipid panel for further risk stratification  6. Tobacco abuse: -Patient reports quitting smoking on 2/11   For questions or updates, please contact [place] Please consult [email or web address] for contact info under Cardiology/STEMI.   Signed, Christell Faith, PA-C [place] Pager: [phone] 04/30/2017, 8:04 AM

## 2017-05-01 ENCOUNTER — Inpatient Hospital Stay: Payer: Medicare HMO | Admitting: Registered Nurse

## 2017-05-01 ENCOUNTER — Inpatient Hospital Stay (HOSPITAL_COMMUNITY)
Admit: 2017-05-01 | Discharge: 2017-05-01 | Disposition: A | Discharge status: Home / Self Care | Payer: Medicare HMO | Attending: Physician Assistant | Admitting: Physician Assistant

## 2017-05-01 ENCOUNTER — Encounter: Payer: Self-pay | Admitting: Anesthesiology

## 2017-05-01 ENCOUNTER — Encounter
Admission: EM | Disposition: A | Discharge status: Home / Self Care | Payer: Self-pay | Source: Home / Self Care | Attending: Internal Medicine

## 2017-05-01 ENCOUNTER — Telehealth: Payer: Self-pay | Admitting: Cardiovascular Disease

## 2017-05-01 ENCOUNTER — Other Ambulatory Visit: Payer: Medicare HMO

## 2017-05-01 DIAGNOSIS — I420 Dilated cardiomyopathy: Secondary | ICD-10-CM

## 2017-05-01 DIAGNOSIS — I4892 Unspecified atrial flutter: Secondary | ICD-10-CM

## 2017-05-01 HISTORY — PX: TEE WITHOUT CARDIOVERSION: SHX5443

## 2017-05-01 HISTORY — PX: CARDIOVERSION: EP1203

## 2017-05-01 LAB — CBC
HCT: 46 % (ref 40.0–52.0)
Hemoglobin: 15.2 g/dL (ref 13.0–18.0)
MCH: 30.5 pg (ref 26.0–34.0)
MCHC: 33 g/dL (ref 32.0–36.0)
MCV: 92.5 fL (ref 80.0–100.0)
PLATELETS: 233 10*3/uL (ref 150–440)
RBC: 4.98 MIL/uL (ref 4.40–5.90)
RDW: 15.7 % — AB (ref 11.5–14.5)
WBC: 4.9 10*3/uL (ref 3.8–10.6)

## 2017-05-01 LAB — LIPID PANEL
CHOLESTEROL: 157 mg/dL (ref 0–200)
HDL: 44 mg/dL (ref >40)
LDL Cholesterol: 85 mg/dL (ref 0–99)
TRIGLYCERIDES: 138 mg/dL (ref <150)
Total CHOL/HDL Ratio: 3.6 RATIO
VLDL: 28 mg/dL (ref 0–40)

## 2017-05-01 SURGERY — ECHOCARDIOGRAM, TRANSESOPHAGEAL
Anesthesia: Monitor Anesthesia Care

## 2017-05-01 MED ORDER — MIDAZOLAM HCL 2 MG/2ML IJ SOLN
INTRAMUSCULAR | Status: AC
  Filled 2017-05-01: qty 2

## 2017-05-01 MED ORDER — PROPOFOL 10 MG/ML IV BOLUS
INTRAVENOUS | Status: AC
  Filled 2017-05-01: qty 40

## 2017-05-01 MED ORDER — METOPROLOL SUCCINATE ER 50 MG PO TB24: 50.0000 mg | ORAL_TABLET | Freq: Every day | ORAL | 1 refills | Status: DC

## 2017-05-01 MED ORDER — PROPOFOL 500 MG/50ML IV EMUL
INTRAVENOUS | Status: AC
  Filled 2017-05-01: qty 50

## 2017-05-01 MED ORDER — LOSARTAN POTASSIUM 50 MG PO TABS: 50.0000 mg | ORAL_TABLET | Freq: Every day | ORAL | 1 refills | Status: DC

## 2017-05-01 MED ORDER — BUTAMBEN-TETRACAINE-BENZOCAINE 2-2-14 % EX AERO
INHALATION_SPRAY | CUTANEOUS | Status: AC
  Filled 2017-05-01: qty 5

## 2017-05-01 MED ORDER — POTASSIUM CHLORIDE CRYS ER 10 MEQ PO TBCR
10.0000 meq | EXTENDED_RELEASE_TABLET | Freq: Once | ORAL | Status: AC
  Administered 2017-05-01: 10 meq via ORAL
  Filled 2017-05-01: qty 1

## 2017-05-01 MED ORDER — METOPROLOL SUCCINATE ER 50 MG PO TB24: 50.0000 mg | ORAL_TABLET | Freq: Every day | ORAL | Status: DC

## 2017-05-01 MED ORDER — DIGOXIN 0.25 MG/ML IJ SOLN
0.5000 mg | Freq: Once | INTRAMUSCULAR | Status: DC
  Filled 2017-05-01: qty 2

## 2017-05-01 MED ORDER — SODIUM CHLORIDE 0.9 % IV SOLN: 250.0000 mL | INTRAVENOUS | Status: DC

## 2017-05-01 MED ORDER — FUROSEMIDE 40 MG PO TABS: 40.0000 mg | ORAL_TABLET | Freq: Every day | ORAL | 1 refills | Status: DC

## 2017-05-01 MED ORDER — LIDOCAINE VISCOUS 2 % MT SOLN
OROMUCOSAL | Status: DC
Start: 2017-05-01 — End: 2017-05-01
  Filled 2017-05-01: qty 15

## 2017-05-01 MED ORDER — FUROSEMIDE 40 MG PO TABS
40.0000 mg | ORAL_TABLET | Freq: Every day | ORAL | Status: DC
  Administered 2017-05-01: 40 mg via ORAL
  Filled 2017-05-01: qty 1

## 2017-05-01 MED ORDER — PROPOFOL 10 MG/ML IV BOLUS
INTRAVENOUS | Status: DC | PRN
  Administered 2017-05-01 (×2): 20 mg via INTRAVENOUS
  Administered 2017-05-01: 50 mg via INTRAVENOUS
  Administered 2017-05-01: 20 mg via INTRAVENOUS

## 2017-05-01 MED ORDER — SODIUM CHLORIDE 0.9% FLUSH: 3.0000 mL | INTRAVENOUS | Status: DC | PRN

## 2017-05-01 MED ORDER — RIVAROXABAN 20 MG PO TABS: 20.0000 mg | ORAL_TABLET | Freq: Every day | ORAL | Status: DC

## 2017-05-01 MED ORDER — ATORVASTATIN CALCIUM 40 MG PO TABS: 40.0000 mg | ORAL_TABLET | Freq: Every day | ORAL | 1 refills | Status: DC

## 2017-05-01 MED ORDER — MIDAZOLAM HCL 2 MG/2ML IJ SOLN
INTRAMUSCULAR | Status: DC | PRN
  Administered 2017-05-01: 1 mg via INTRAVENOUS

## 2017-05-01 MED ORDER — RIVAROXABAN 20 MG PO TABS: 20.0000 mg | ORAL_TABLET | Freq: Every day | ORAL | 0 refills | Status: DC

## 2017-05-01 MED ORDER — ATORVASTATIN CALCIUM 20 MG PO TABS: 40.0000 mg | ORAL_TABLET | Freq: Every day | ORAL | Status: DC

## 2017-05-01 MED ORDER — LOSARTAN POTASSIUM 50 MG PO TABS
50.0000 mg | ORAL_TABLET | Freq: Every day | ORAL | Status: DC
  Administered 2017-05-01: 50 mg via ORAL
  Filled 2017-05-01: qty 1

## 2017-05-01 MED ORDER — SODIUM CHLORIDE 0.9 % IV SOLN
INTRAVENOUS | Status: DC
  Administered 2017-05-01: 10:00:00 via INTRAVENOUS

## 2017-05-01 MED ORDER — SODIUM CHLORIDE 0.9% FLUSH
3.0000 mL | Freq: Two times a day (BID) | INTRAVENOUS | Status: DC
  Administered 2017-05-01: 3 mL via INTRAVENOUS

## 2017-05-01 NOTE — Procedures (Signed)
Transesophageal Echocardiogram :  Indication: atrial flutter, cardiomyopathy Requesting/ordering  physician:   Procedure: Benzocaine spray x2 and 2 mls x 2 of viscous lidocaine were given orally to provide local anesthesia to the oropharynx. The patient was positioned supine on the left side, bite block provided. The patient was moderately sedated with the doses of versed and fentanyl as detailed below.  Using digital technique an omniplane probe was advanced into the distal esophagus without incident.   Moderate sedation: Provided by anesthesia  I was face to face during this time  See report in EPIC  for complete details: In brief, transgastric imaging revealed severely depressed EF, EF <20% Global hypokinesis no mural apical thrombus. Spontaneous contrast noted in the left atrium, appendage and LV Right sided cardiac chambers were dilated with elevated pressures   Imaging of the septum showed no ASD or VSD 2D and color flow confirmed no PFO  The LA was well visualized in orthogonal views.  There wasspontaneous contrast and no thrombus in the LA and LA appendage   The descending thoracic aorta had  no evidence of aneurysmal dilation or disection There was moderate aortic athero in the arch and descending aorta  Ida Rogue 05/01/2017 10:56 AM

## 2017-05-01 NOTE — Anesthesia Postprocedure Evaluation (Signed)
Anesthesia Post Note  Patient: Nathan [name].  Procedure(s) Performed: TRANSESOPHAGEAL ECHOCARDIOGRAM (TEE) (N/A ) CARDIOVERSION (N/A )  Patient location during evaluation: Cath Lab Anesthesia Type: MAC Level of consciousness: awake and alert Pain management: pain level controlled Vital Signs Assessment: post-procedure vital signs reviewed and stable Respiratory status: spontaneous breathing, nonlabored ventilation, respiratory function stable and patient connected to nasal cannula oxygen Cardiovascular status: stable and blood pressure returned to baseline Postop Assessment: no apparent nausea or vomiting Anesthetic complications: no     Last Vitals:  Vitals:   05/01/17 1129 05/01/17 1139  BP: (!) 140/98 (!) 127/92  Pulse: (!) 59 62  Resp: 16 18  Temp:    SpO2: 100% 100%    Last Pain:  Vitals:   05/01/17 1000  TempSrc: Oral  PainSc: 0-No pain                 Alphonsus Sias

## 2017-05-01 NOTE — Progress Notes (Signed)
Patient discharged via wheelchair and private vehicle all dc instructions given and verbalized understanding. Daughter at bedside and verbalized she will be with the patient for the next 48 hrs. Stable at time of discharge no complaints or concerns expressed.

## 2017-05-01 NOTE — Progress Notes (Signed)
Provided patient with "Living Better with Heart Failure" packet. Briefly reviewed definition of heart failure and signs and symptoms of an exacerbation. Reviewed importance of and reason behind checking weight daily in the AM, after using the bathroom, but before getting dressed. Discussed when to call the Dr= weight gain of >2lb overnight of 5lb in a week,  Discussed yellow zone= call MD: weight gain of >2lb overnight of 5lb in a week, increased swelling, increased SOB when lying down, chest discomfort, dizziness, increased fatigue Red Zone= call 911: struggle to breath, fainting or near fainting, significant chest pain Reviewed low sodium diet <2g/day-provided handout of recommended and not recommended foods  Fluid restriction <2L/day Reviewed how to read nutrition label Reviewed medication changes: Explained briefly why pt is on the medications (either make you feel better, live longer or keep you out of the hospital) and discussed monitoring and side effects  Discussed tobacco cessation: Patient recently quit. Spoke to patient about risk of recidivism and spoke about resources.  Discussed exercise: Patient walks for exercise. Encouraged patient to continue as tolerate.   Ulice Dash, PharmD Clinical Pharmacist

## 2017-05-01 NOTE — Progress Notes (Signed)
*  PRELIMINARY RESULTS* Echocardiogram Echocardiogram Transesophageal has been performed.  Nathan [name] 05/01/2017, 10:51 AM

## 2017-05-01 NOTE — Progress Notes (Signed)
Progress Note  Patient Name: Nathan [name]. Date of Encounter: 05/01/2017  Primary Cardiologist: new to [facility] - consult by [place]  Subjective   Remains in atrial flutter with RVR with heart rates in the 120s bpm. Echo showed EF 20-25% (done while tachycardic), left atrium 54 mm. Asymptomatic. Reports he needs to go home today to "tend to business."  Inpatient Medications    Scheduled Meds: . apixaban  5 mg Oral BID  . digoxin  0.5 mg Intravenous Once  . diltiazem  60 mg Oral M0N  . folic acid  1 mg Oral Daily  . metoprolol tartrate  50 mg Oral Q6H  . multivitamin with minerals  1 tablet Oral Daily  . thiamine  100 mg Oral Daily   Continuous Infusions:  PRN Meds: acetaminophen **OR** acetaminophen, hydrALAZINE, ipratropium-albuterol, LORazepam, ondansetron **OR** ondansetron (ZOFRAN) IV   Vital Signs    Vitals:   04/30/17 1914 05/01/17 0327 05/01/17 0348 05/01/17 0716  BP: (!) 128/93 (!) 142/105  (!) 153/117  Pulse: 80 (!) 52 91 (!) 120  Resp: 17 17  16   Temp:  98.3 F (36.8 C)  98.2 F (36.8 C)  TempSrc:      SpO2: 100% 100%    Weight:      Height:        Intake/Output Summary (Last 24 hours) at 05/01/2017 0719 Last data filed at 05/01/2017 0300 Gross per 24 hour  Intake 671.02 ml  Output 1000 ml  Net -328.98 ml   Filed Weights   04/29/17 1812 04/29/17 2337  Weight: 235 lb (106.6 kg) 235 lb 0.2 oz (106.6 kg)    Telemetry    Atrial flutter with RVR, 120s bpm - Personally Reviewed  ECG    n/a - Personally Reviewed  Physical Exam   GEN: No acute distress.   Neck: No JVD. Cardiac: Tachycardic, no murmurs, rubs, or gallops.  Respiratory: Clear to auscultation bilaterally.  GI: Soft, nontender, non-distended.   MS: No edema; No deformity. Neuro:  Alert and oriented x 3; Nonfocal.  Psych: Normal affect.  Labs    Chemistry Recent Labs  Lab 04/29/17 1941 04/30/17 0003 04/30/17 0503  NA 137  --  140  K 4.2  --  4.1  CL 106  --  110  CO2  19*  --  21*  GLUCOSE 107*  --  105*  BUN 28*  --  25*  CREATININE 1.40* 1.26* 1.23  CALCIUM 9.2  --  9.2  PROT 7.4  --   --   ALBUMIN 3.5  --   --   AST 45*  --   --   ALT 61  --   --   ALKPHOS 87  --   --   BILITOT 0.8  --   --   GFRNONAA 51* 58* 59*  GFRAA 59* >60 >60  ANIONGAP 12  --  9     Hematology Recent Labs  Lab 04/30/17 0003 04/30/17 0503 05/01/17 0623  WBC 4.7 4.9 4.9  RBC 4.67 4.63 4.98  HGB 14.5 14.3 15.2  HCT 42.1 42.2 46.0  MCV 90.3 91.3 92.5  MCH 31.0 31.0 30.5  MCHC 34.3 33.9 33.0  RDW 15.5* 15.5* 15.7*  PLT 232 220 233    Cardiac Enzymes Recent Labs  Lab 04/29/17 1941 04/30/17 0003 04/30/17 0503  TROPONINI 1.50* 1.22* 1.06*  1.12*   No results for input(s): TROPIPOC in the last 168 hours.   BNP Recent Labs  Lab  04/29/17 2023  BNP 1,558.0*     DDimer No results for input(s): DDIMER in the last 168 hours.   Radiology    Dg Chest Portable 1 View  Result Date: 04/29/2017 IMPRESSION: Cardiomegaly and low lung volumes. Suspect mild pulmonary venous congestion. Decreased sensitivity and specificity exam due to technique related factors, as described above. Electronically Signed   By: Abigail Miyamoto M.D.   On: 04/29/2017 18:51    Cardiac Studies   TTE 04/30/17: Study Conclusions  - Left ventricle: The cavity size was normal. Systolic function was   severely reduced. The estimated ejection fraction was in the   range of 20% to 25%. Diffuse hypokinesis. Regional wall motion   abnormalities cannot be excluded. The study is not technically   sufficient to allow evaluation of LV diastolic function. - Left atrium: The atrium was moderately dilated. - Right ventricle: Systolic function was mildly reduced. - Pulmonary arteries: Systolic pressure was mildly elevated. PA   peak pressure: 42 mm Hg (S).  Impressions:  - Rhythm is atrial flutter.  Patient Profile     67 y.o. male with history of tobacco abuse quitting 04/28/17 who is being  seen today for the evaluation of new onset atrial flutter with RVR, elevated troponin, and acute combined CHF.  Assessment & Plan    1. New onset atrial flutter with RVR: -Remains in atrial flutter with RVR with 2:1 AV block with heart rates in the 120s bpm -Refused metoprolol overnight -Echo showed EF 20-25% (done while tachycardic), left atrium 54 mm -May need to consider adding amiodarone if TEE/DCCV is unsuccessful today -Will plan for TEE/DCCV today -Hold IV digoxin this morning in an effort to limit post DCCV bradycardia -Continue metoprolol and diltiazem, consolidation will be completed following DCCV, if successful -Eliquis 5 mg bid -CHADS2VASc at least 2 (CHF, age x 1)  2. Elevated troponin: -No chest pain -Initial troponin 1.50, down trending -Echo as above with EF 20-25% with diffuse HK -Will need ischemic evaluation given troponin bump, timing to to be determined on his rate control -Lopressor as above  3. Acute systolic CHF/pulmonary hypertension: -He does not appear grossly volume overloaded -Gentle diuresis as needed -Lopressor as above -Would look to stop diltiazem when able given his cardiomyopathy -Will need repeat limited echo once heart rate is better controlled to evaluate for improvement in EF, if EF remains reduced at that time, will need LHC -CHF education  4. Abnormal TSH: -Add on free T4, total T3  5. Prediabetes: -Needs outpatient follow up     For questions or updates, please contact [place] Please consult [email or web address] for contact info under Cardiology/STEMI.    Signed, Christell Faith, PA-C [facility] Pager: [phone] 05/01/2017, 7:19 AM

## 2017-05-01 NOTE — Anesthesia Procedure Notes (Signed)
Date/Time: 05/01/2017 10:30 AM Performed by: Allean Found, CRNA Pre-anesthesia Checklist: Patient identified, Emergency Drugs available, Suction available, Patient being monitored and Timeout performed Patient Re-evaluated:Patient Re-evaluated prior to induction Oxygen Delivery Method: Nasal cannula Placement Confirmation: positive ETCO2 Dental Injury: Teeth and Oropharynx as per pre-operative assessment

## 2017-05-01 NOTE — Anesthesia Post-op Follow-up Note (Signed)
Anesthesia QCDR form completed.        

## 2017-05-01 NOTE — Discharge Summary (Signed)
[place] [facility] [place] NAME: Nathan [name]    MR#:  [id number]  DATE OF BIRTH:  04-Nov-1950  DATE OF ADMISSION:  04/29/2017   ADMITTING PHYSICIAN: Lance Coon, MD  DATE OF DISCHARGE:  05/01/2017  PRIMARY CARE PHYSICIAN: Patient, [name]   ADMISSION DIAGNOSIS:  Atrial flutter, unspecified type ([place]) [I48.92] Atrial flutter with rapid ventricular response ([facility]) [I48.92] DISCHARGE DIAGNOSIS:  Principal Problem:   Atrial flutter with rapid ventricular response ([facility]) Active Problems:   Elevated troponin   AKI (acute kidney injury) ([place])  SECONDARY DIAGNOSIS:   Past Medical History:  Diagnosis Date  . Atrial flutter [facility])    HOSPITAL COURSE:   Atrial flutter with rapid ventricular response ([place]) -patient was given several doses of IV Cardizem, then started on a Cardizem drip.  He is off Cardizem and miodarone drip.  Heart rate is controlled. Cardiologist started p.o. Cardizem and lopressor, he was on heparin drip. Echocardiogram: Ejection fraction 40-98% with systolic dysfunction.. S/p successful TEE cardioversion today.  Xarelto 20 mg daily and would benefit from cardiac catheterization right and left heart in one month per Dr. Rockey Situ.  Elevated troponin -initial troponin was elevated greater than 1. Xarelto 20 mg daily and would benefit from cardiac catheterization right and left heart in one month per Dr. Rockey Situ.  AKI (acute kidney injury) ([place]) -unclear baseline, creatinine is elevated. Patient reports no prior history of any significant medical problems.  Improved with IV fluid support.  Acute systolic CHF/pulmonary hypertension: Continue Lasix 40 mg po daily. metoprolol succinate Losartan -CHF education Left heart catheterization in 1 month Per Dr. Rockey Situ.  Tobacco abuse.  Smoking cessation was counseled for 3-4 minutes.  Nicotine patch.  DISCHARGE CONDITIONS:  Stable, discharge to home today. CONSULTS OBTAINED:   Treatment Team:  [name], MD DRUG ALLERGIES:  No Known Allergies DISCHARGE MEDICATIONS:   Allergies as of 05/01/2017   No Known Allergies     Medication List    TAKE these medications   atorvastatin 40 MG tablet Commonly known as:  LIPITOR Take 1 tablet (40 mg total) by mouth daily at 6 PM.   furosemide 40 MG tablet Commonly known as:  LASIX Take 1 tablet (40 mg total) by mouth daily.   losartan 50 MG tablet Commonly known as:  COZAAR Take 1 tablet (50 mg total) by mouth daily.   metoprolol succinate 50 MG 24 hr tablet Commonly known as:  TOPROL-XL Take 1 tablet (50 mg total) by mouth daily. Take with or immediately following a meal.   rivaroxaban 20 MG Tabs tablet Commonly known as:  XARELTO Take 1 tablet (20 mg total) by mouth daily.        DISCHARGE INSTRUCTIONS:  See AVS.  If you experience worsening of your admission symptoms, develop shortness of breath, life threatening emergency, suicidal or homicidal thoughts you must seek medical attention immediately by calling 911 or calling your MD immediately  if symptoms less severe.  You Must read complete instructions/literature along with all the possible adverse reactions/side effects for all the Medicines you take and that have been prescribed to you. Take any new Medicines after you have completely understood and accpet all the possible adverse reactions/side effects.   Please note  You were cared for by a hospitalist during your hospital stay. If you have any questions about your discharge medications or the care you received while you were in the hospital after you are discharged, you can call  the unit and asked to speak with the hospitalist on call if the hospitalist that took care of you is not available. Once you are discharged, your primary care physician will handle any further medical issues. Please note that NO REFILLS for any discharge medications will be authorized once you are discharged, as it  is imperative that you return to your primary care physician (or establish a relationship with a primary care physician if you do not have one) for your aftercare needs so that they can reassess your need for medications and monitor your lab values.    On the day of Discharge:  VITAL SIGNS:  Blood pressure (!) 127/92, pulse 62, temperature 97.6 F (36.4 C), temperature source Oral, resp. rate 18, height 6\' 5"  (1.956 m), weight 235 lb (106.6 kg), SpO2 100 %. PHYSICAL EXAMINATION:  GENERAL:  67 y.o.-year-old patient lying in the bed with no acute distress.  EYES: Pupils equal, round, reactive to light and accommodation. No scleral icterus. Extraocular muscles intact.  HEENT: Head atraumatic, normocephalic. Oropharynx and nasopharynx clear.  NECK:  Supple, no jugular venous distention. No thyroid enlargement, no tenderness.  LUNGS: Normal breath sounds bilaterally, mild wheezing and rales. No use of accessory muscles of respiration.  CARDIOVASCULAR: S1, S2 normal. No murmurs, rubs, or gallops.  ABDOMEN: Soft, non-tender, non-distended. Bowel sounds present. No organomegaly or mass.  EXTREMITIES: No pedal edema, cyanosis, or clubbing.  NEUROLOGIC: Cranial nerves II through XII are intact. Muscle strength 5/5 in all extremities. Sensation intact. Gait not checked.  PSYCHIATRIC: The patient is alert and oriented x 3.  SKIN: No obvious rash, lesion, or ulcer.  DATA REVIEW:   CBC Recent Labs  Lab 05/01/17 0623  WBC 4.9  HGB 15.2  HCT 46.0  PLT 233    Chemistries  Recent Labs  Lab 04/29/17 1941  04/30/17 0503  NA 137  --  140  K 4.2  --  4.1  CL 106  --  110  CO2 19*  --  21*  GLUCOSE 107*  --  105*  BUN 28*  --  25*  CREATININE 1.40*   < > 1.23  CALCIUM 9.2  --  9.2  MG  --   --  2.0  AST 45*  --   --   ALT 61  --   --   ALKPHOS 87  --   --   BILITOT 0.8  --   --    < > = values in this interval not displayed.     Microbiology Results  Results for orders placed or  performed during the hospital encounter of 04/29/17  MRSA PCR Screening     Status: None   Collection Time: 04/29/17 11:36 PM  Result Value Ref Range Status   MRSA by PCR NEGATIVE NEGATIVE Final    Comment:        The GeneXpert MRSA Assay (FDA approved for NASAL specimens only), is one component of a comprehensive MRSA colonization surveillance program. It is not intended to diagnose MRSA infection nor to guide or monitor treatment for MRSA infections. Performed at [facility], [address], [place], [place] [address]     RADIOLOGY:  No results found.   Management plans discussed with the patient, his daughter and they are in agreement.  CODE STATUS: Full Code   TOTAL TIME TAKING CARE OF THIS PATIENT: 38 minutes.    Demetrios Loll M.D on 05/01/2017 at 2:03 PM  Between 7am to 6pm - Pager -  [phone]  After 6pm go to [email or web address] - Proofreader  [facility]  Office  [phone]  CC: Primary care physician; Patient, [name]   Note: This dictation was prepared with Dragon dictation along with smaller phrase technology. Any transcriptional errors that result from this process are unintentional.

## 2017-05-01 NOTE — Anesthesia Preprocedure Evaluation (Addendum)
Anesthesia Evaluation  Patient identified by MRN, date of birth, ID band Patient awake    Reviewed: Allergy & Precautions, H&P , NPO status , Patient's Chart, lab work & pertinent test results  History of Anesthesia Complications Negative for: history of anesthetic complications  Airway Mallampati: III  TM Distance: <3 FB Neck ROM: limited    Dental  (+) Chipped, Poor Dentition, Missing, Loose   Pulmonary neg shortness of breath, former smoker,           Cardiovascular Exercise Tolerance: Good (-) angina(-) DOE + dysrhythmias Atrial Fibrillation      Neuro/Psych negative neurological ROS  negative psych ROS   GI/Hepatic negative GI ROS, Neg liver ROS, neg GERD  ,  Endo/Other  negative endocrine ROS  Renal/GU CRFRenal disease  negative genitourinary   Musculoskeletal   Abdominal   Peds  Hematology negative hematology ROS (+)   Anesthesia Other Findings Signs and symptoms suggestive of sleep apnea    Past Medical History: No date: Atrial flutter ([place])  History reviewed. No pertinent surgical history.  BMI    Body Mass Index:  27.87 kg/m      Reproductive/Obstetrics negative OB ROS                            Anesthesia Physical Anesthesia Plan  ASA: III  Anesthesia Plan: General and MAC   Post-op Pain Management:    Induction: Intravenous  PONV Risk Score and Plan:   Airway Management Planned: Natural Airway and Nasal Cannula  Additional Equipment:   Intra-op Plan:   Post-operative Plan:   Informed Consent: I have reviewed the patients History and Physical, chart, labs and discussed the procedure including the risks, benefits and alternatives for the proposed anesthesia with the patient or authorized representative who has indicated his/her understanding and acceptance.   Dental Advisory Given  Plan Discussed with: Anesthesiologist, CRNA and Surgeon  Anesthesia  Plan Comments: (Patient consented for risks of anesthesia including but not limited to:  - adverse reactions to medications - risk of intubation if required - damage to teeth, lips or other oral mucosa - sore throat or hoarseness - Damage to heart, brain, lungs or loss of life  Patient voiced understanding.)        Anesthesia Quick Evaluation

## 2017-05-01 NOTE — Telephone Encounter (Signed)
TCM....  Patient is being discharged later today   They saw Dr Rockey Situ   They are scheduled to see Dr Rockey Situ  on 05/09/17   They were seen for cardioversion   They need to be seen within 1 weeks  Pt is not on wait list   Please call

## 2017-05-01 NOTE — Care Management (Signed)
Spoke with cardiology regarding need for zoll life vest for low EF and not pursuing a referral at present time.  Patient to discharge home on Xarelto.  Spoke with patient and found he does have medication coverage with his [employer].  All of patient's discharge meds with exception of  Xarelto is on the [employer] 4 dollar list. Provided patient with Xarelto 30 day trial coupon. There is discussion regarding whether patient is able to purchase scales.  CM will speak with patient's daughter when she arrives and if unable to purchase, CM will provide.  Had unit secretary to make patient an appointment with [facility] to get established with pcp.

## 2017-05-01 NOTE — Transfer of Care (Signed)
Immediate Anesthesia Transfer of Care Note  Patient: Nathan [name].  Procedure(s) Performed: TRANSESOPHAGEAL ECHOCARDIOGRAM (TEE) (N/A ) CARDIOVERSION (N/A )  Patient Location: PACU  Anesthesia Type:General  Level of Consciousness: sedated  Airway & Oxygen Therapy: Patient Spontanous Breathing and Patient connected to nasal cannula oxygen  Post-op Assessment: Report given to RN and Post -op Vital signs reviewed and stable  Post vital signs: Reviewed and stable  Last Vitals:  Vitals:   05/01/17 0716 05/01/17 1000  BP: (!) 153/117 (!) 149/115  Pulse: (!) 120 92  Resp: 16 18  Temp: 36.8 C 36.4 C  SpO2:  100%    Last Pain:  Vitals:   05/01/17 1000  TempSrc: Oral  PainSc: 0-No pain         Complications: No apparent anesthesia complications

## 2017-05-01 NOTE — Progress Notes (Signed)
Post cardioversion: SB/SR with occ. PAC's. Pt. Small yellow hoop earring put back in pt. Left ear now. Pt. Drowsy; responds appropriately.

## 2017-05-01 NOTE — CV Procedure (Signed)
Cardioversion procedure note For atrial flutter, typical, persistent  Procedure Details:  Consent: Risks of procedure as well as the alternatives and risks of each were explained to the (patient/caregiver). Consent for procedure obtained.  Time Out: Verified patient identification, verified procedure, site/side was marked, verified correct patient position, special equipment/implants available, medications/allergies/relevent history reviewed, required imaging and test results available. Performed  Patient placed on cardiac monitor, pulse oximetry, supplemental oxygen as necessary.  Sedation given: propofol IV, Dr. Lavone Neri Pacer pads placed anterior and posterior chest.   Cardioverted 1 time(s).  Cardioverted at  150J. Synchronized biphasic Converted to NSR   Evaluation: Findings: Post procedure EKG shows: NSR Complications: None Patient did tolerate procedure well.  Time Spent Directly with the Patient:  36 minutes   Esmond Plants, M.D., Ph.D.

## 2017-05-01 NOTE — Telephone Encounter (Signed)
Currently admitted at this time. 

## 2017-05-01 NOTE — Progress Notes (Signed)
Progress Note  Patient Name: Nathan [name]. Date of Encounter: 05/01/2017  Primary Cardiologist: New to [facility]  Subjective   Shortness of breath overnight, Amiodarone held, diltiazem infusion held, started on diltiazem and metoprolol pill Heart rate continues to be 120 bpm at rest Threatening to go home, "has dizziness a take care of" Echocardiogram results discussed with him in detail showing cardiomyopathy severely depressed global LV dysfunction Etiology of cardiomyopathy unclear  Inpatient Medications    Scheduled Meds: . [MAR Hold] apixaban  5 mg Oral BID  . butamben-tetracaine-benzocaine      . [MAR Hold] diltiazem  60 mg Oral Q8H  . [MAR Hold] folic acid  1 mg Oral Daily  . lidocaine      . [MAR Hold] metoprolol tartrate  50 mg Oral Q6H  . [MAR Hold] multivitamin with minerals  1 tablet Oral Daily  . sodium chloride flush  3 mL Intravenous Q12H  . [MAR Hold] thiamine  100 mg Oral Daily   Continuous Infusions: . sodium chloride    . sodium chloride     PRN Meds: [MAR Hold] acetaminophen **OR** [MAR Hold] acetaminophen, [MAR Hold] hydrALAZINE, [MAR Hold] ipratropium-albuterol, [MAR Hold] LORazepam, [MAR Hold] ondansetron **OR** [MAR Hold] ondansetron (ZOFRAN) IV, sodium chloride flush   Vital Signs    Vitals:   05/01/17 0327 05/01/17 0348 05/01/17 0716 05/01/17 1000  BP: (!) 142/105  (!) 153/117 (!) 149/115  Pulse: (!) 52 91 (!) 120 92  Resp: 17  16 18   Temp: 98.3 F (36.8 C)  98.2 F (36.8 C) 97.6 F (36.4 C)  TempSrc:    Oral  SpO2: 100%   100%  Weight:    235 lb (106.6 kg)  Height:    6\' 5"  (1.956 m)    Intake/Output Summary (Last 24 hours) at 05/01/2017 1102 Last data filed at 05/01/2017 1051 Gross per 24 hour  Intake 440 ml  Output 300 ml  Net 140 ml   Filed Weights   04/29/17 1812 04/29/17 2337 05/01/17 1000  Weight: 235 lb (106.6 kg) 235 lb 0.2 oz (106.6 kg) 235 lb (106.6 kg)    Telemetry    Atrial flutter with rate 120 bpm up to 130 bpm-  Personally Reviewed  ECG      Physical Exam   GEN: No acute distress.   Neck:  12+ JVP Cardiac:  regular, rapid no murmurs, rubs, or gallops.  Respiratory:  moderately decreased breath sounds, also the bases bilaterally GI: Soft, nontender, non-distended  MS: No edema; No deformity. Neuro:  Nonfocal  Psych: anxious to go home  Labs    Chemistry Recent Labs  Lab 04/29/17 1941 04/30/17 0003 04/30/17 0503  NA 137  --  140  K 4.2  --  4.1  CL 106  --  110  CO2 19*  --  21*  GLUCOSE 107*  --  105*  BUN 28*  --  25*  CREATININE 1.40* 1.26* 1.23  CALCIUM 9.2  --  9.2  PROT 7.4  --   --   ALBUMIN 3.5  --   --   AST 45*  --   --   ALT 61  --   --   ALKPHOS 87  --   --   BILITOT 0.8  --   --   GFRNONAA 51* 58* 59*  GFRAA 59* >60 >60  ANIONGAP 12  --  9     Hematology Recent Labs  Lab 04/30/17 0003 04/30/17 0503 05/01/17 0630  WBC 4.7 4.9 4.9  RBC 4.67 4.63 4.98  HGB 14.5 14.3 15.2  HCT 42.1 42.2 46.0  MCV 90.3 91.3 92.5  MCH 31.0 31.0 30.5  MCHC 34.3 33.9 33.0  RDW 15.5* 15.5* 15.7*  PLT 232 220 233    Cardiac Enzymes Recent Labs  Lab 04/29/17 1941 04/30/17 0003 04/30/17 0503  TROPONINI 1.50* 1.22* 1.06*  1.12*   No results for input(s): TROPIPOC in the last 168 hours.   BNP Recent Labs  Lab 04/29/17 2023  BNP 1,558.0*     DDimer No results for input(s): DDIMER in the last 168 hours.   Radiology    Dg Chest Portable 1 View  Result Date: 04/29/2017 CLINICAL DATA:  Exertional shortness of breath with chest pain. EXAM: PORTABLE CHEST 1 VIEW COMPARISON:  None. FINDINGS: Multiple leads, wires, and external pacer/defibrillator projecting over the chest. Patient rotated minimally right. Midline trachea. Moderate cardiomegaly. No pleural effusion or pneumothorax. Low lung volumes with resultant pulmonary interstitial prominence. Suspect concurrent mild pulmonary venous congestion. No overt congestive failure or pulmonary consolidation. IMPRESSION:  Cardiomegaly and low lung volumes. Suspect mild pulmonary venous congestion. Decreased sensitivity and specificity exam due to technique related factors, as described above. Electronically Signed   By: Abigail Miyamoto M.D.   On: 04/29/2017 18:51    Cardiac Studies   Echocardiogram performed yesterday showing severely depressed ejection fraction 20% global hypokinesis  Patient Profile      67 y.o. male with history of tobacco abuse quitting 2/11/19who is being seen today for the evaluation of new onset atrial flutter with RVR, elevated troponin, and acute combined CHF.    Assessment & Plan     1. New onset atrial flutter with RVR: -Remains in atrial flutter with RVR with 2:1 AV block with heart rates in the 120s bpm -Refused metoprolol overnight -Echo showed EF 20-25% (done while tachycardic), left atrium 54 mm -CHADS2VASc at least 2 (CHF, age x 1)-- We will plan on successful TEE cardioversion performed this morning Very important that he stays on anticoagulation at least for the next month if not indefinitely following cardioversion If compliance is an issue which I suspect it will be,   would consider Xarelto 20 mg daily Ideally would recommend he stay in the hospital but he is insisting on going home  2. Elevated troponin: Likely has underlying coronary artery disease Rate elevated in the setting of tachycardia, likely demand ischemia -Echo as above with EF 20-25% with diffuse HK Unable to exclude ischemic cardiomyopathy versus tachycardia mediated, alcohol mediated He would benefit from cardiac catheterization right and left heart in one month  3. Acute systolic CHF/pulmonary hypertension: Elevated pressures obtained through echocardiogram today, TEE Would send home on Lasix 40 daily metoprolol succinate Losartan -CHF education Left heart catheterization in 1 month  4. Abnormal TSH: -Add on free T4, total T3  5. Prediabetes: -Needs outpatient follow up   Total  encounter time more than 35 minutes  Greater than 50% was spent in counseling and coordination of care with the patient  For questions or updates, please contact [place] Please consult [email or web address] for contact info under Cardiology/STEMI.      Signed, Ida Rogue, MD  05/01/2017, 11:02 AM

## 2017-05-01 NOTE — Care Management (Signed)
Spoke with patient's daughter and says patient does not have financial resources to purchase scales. Provided

## 2017-05-02 ENCOUNTER — Encounter: Payer: Self-pay | Admitting: Cardiovascular Disease

## 2017-05-02 SURGERY — ECHOCARDIOGRAM, TRANSESOPHAGEAL
Anesthesia: General

## 2017-05-05 NOTE — Telephone Encounter (Signed)
Patient contacted regarding discharge from [facility] on 05/01/17.   Patient understands to follow up with provider ? On 05/09/17 at [place] [facility] [place].  Patient understands discharge instructions? Yes  Patient understands medications and regiment? Yes  Patient understands to bring all medications to this visit? Yes

## 2017-05-07 NOTE — Progress Notes (Signed)
Cardiology Office Note  Date:  05/09/2017   ID:  Nathan [name]., DOB [birth year], MRN [id number]  PCP:  Patient, [name]   Chief Complaint  Patient presents with  . Other    Hospital follow up. Patient c/o Atrovastation keeoing him up at night. Patient denies chest pain and SOB at this time. Meds reviewed verbally with patient.     HPI:  67 y.o.malewith history of  tobacco abuse quit 04/28/17 Admission to the hospital 04/2017 with new onset atrial flutter with RVR,  elevated troponin,  acutecombinedsystolic and diastolic CHF S/p TEE and cardioversion 05/01/2017 Who presents for follow up of his cardiomyopathy, atrial flutter after d/c  In the hospital  atrial flutter with RVR with 2:1 AV block with heart rates in the 120s bpm Despite advnacing meds He was refusing  metoprolol  -Echo showed EF 20-25% (done while tachycardic), left atrium 54 mm -CHADS2VASC at least least 2 (CHF, age x 1)  Successful TEE cardioversion  D/c on anticoagulation Wanted to leave AMA He did not want to stay in the hospital for ischemic workup  Elevated pressures obtained through echocardiogram, TEE D/c  on Lasix 40 daily  In follow-up today he reports that he feels well with no complaints Feels at his baseline The Lipitor makes him wide awake, does not like taking this in the evening Reports the metoprolol makes him tired, does not like taking this in the morning Denies significant leg swelling, no tachycardia or palpitations or chest pain  EKG personally reviewed by myself on todays visit Shows normal sinus rhythm with rate 70 bpm no significant ST or T wave changes     PMH:   has a past medical history of Atrial flutter ([place]).  PSH:    Past Surgical History:  Procedure Laterality Date  . CARDIOVERSION N/A 05/01/2017   Procedure: CARDIOVERSION;  Surgeon: [name], MD;  Location: [facility] ORS;  Service: Cardiovascular;  Laterality: N/A;  . TEE WITHOUT CARDIOVERSION N/A  05/01/2017   Procedure: TRANSESOPHAGEAL ECHOCARDIOGRAM (TEE);  Surgeon: [name], MD;  Location: [facility] ORS;  Service: Cardiovascular;  Laterality: N/A;    Current Outpatient Medications  Medication Sig Dispense Refill  . atorvastatin (LIPITOR) 40 MG tablet Take 1 tablet (40 mg total) by mouth daily at 6 PM. 30 tablet 1  . furosemide (LASIX) 40 MG tablet Take 1 tablet (40 mg total) by mouth daily. 30 tablet 1  . losartan (COZAAR) 50 MG tablet Take 1 tablet (50 mg total) by mouth daily. 30 tablet 1  . metoprolol succinate (TOPROL-XL) 50 MG 24 hr tablet Take 1 tablet (50 mg total) by mouth daily. Take with or immediately following a meal. 30 tablet 1  . rivaroxaban (XARELTO) 20 MG TABS tablet Take 1 tablet (20 mg total) by mouth daily. 30 tablet 0   No current facility-administered medications for this visit.      Allergies:   Patient has no known allergies.   Social History:  The patient  reports that he has quit smoking. he has never used smokeless tobacco. He reports that he does not drink alcohol.   Family History:   family history includes Alzheimer's disease in his father and mother.    Review of Systems: Review of Systems  Constitutional: Negative.   Respiratory: Negative.   Cardiovascular: Negative.   Gastrointestinal: Negative.   Musculoskeletal: Negative.   Neurological: Negative.   Psychiatric/Behavioral: Negative.   All other systems reviewed and are negative.    PHYSICAL  EXAM: VS:  BP (!) 150/78 (BP Location: Left Arm, Patient Position: Sitting, Cuff Size: Normal)   Pulse 70   Ht 6\' 5"  (1.956 m)   Wt 204 lb (92.5 kg)   BMI 24.19 kg/m  , BMI Body mass index is 24.19 kg/m. GEN: Well nourished, well developed, in no acute distress, obese  HEENT: normal  Neck: no JVD, carotid bruits, or masses Cardiac: RRR; no murmurs, rubs, or gallops,no edema  Respiratory:  clear to auscultation bilaterally, normal work of breathing GI: soft, nontender, nondistended, +  BS MS: no deformity or atrophy  Skin: warm and dry, no rash Neuro:  Strength and sensation are intact Psych: euthymic mood, full affect    Recent Labs: 04/29/2017: ALT 61; B Natriuretic Peptide 1,558.0 04/30/2017: BUN 25; Creatinine, Ser 1.23; Magnesium 2.0; Potassium 4.1; Sodium 140; TSH 7.441 05/01/2017: Hemoglobin 15.2; Platelets 233    Lipid Panel Lab Results  Component Value Date   CHOL 157 05/01/2017   HDL 44 05/01/2017   LDLCALC 85 05/01/2017   TRIG 138 05/01/2017      Wt Readings from Last 3 Encounters:  05/09/17 204 lb (92.5 kg)  05/01/17 235 lb (106.6 kg)       ASSESSMENT AND PLAN:  Atrial flutter with rapid ventricular response (HCC) - Plan: EKG 12-Lead Maintaining normal sinus rhythm Recommended he stay on his anticoagulation, Xarelto 1 a day Metoprolol for rate and rhythm control  Ischemic cardiomyopathy - Plan: EKG 12-Lead Limited echo for ejection fraction has been ordered If ejection fraction continues to run low now in normal sinus rhythm we will need ischemia workup.  Certainly possible he had tachycardia mediated cardiomyopathy  Pulmonary HTN ([place]) - Plan: EKG 12-Lead Suggested he stay on his Lasix 40 mg daily Repeat echocardiogram has been ordered  Acute on chronic combined systolic and diastolic CHF (congestive heart failure) ([place]) - Plan: EKG 12-Lead Improved shortness of breath, weight is dramatically improved since discharge Fluid retention previously secondary to atrial flutter, causing acute systolic and diastolic CHF   Total encounter time more than 45 minutes  Greater than 50% was spent in counseling and coordination of care with the patient   Disposition:   F/U  3 months   Orders Placed This Encounter  Procedures  . EKG 12-Lead     Signed, Esmond Plants, M.D., Ph.D. 05/09/2017  [place], [place]

## 2017-05-09 ENCOUNTER — Ambulatory Visit (INDEPENDENT_AMBULATORY_CARE_PROVIDER_SITE_OTHER): Payer: Medicare HMO | Admitting: Cardiovascular Disease

## 2017-05-09 ENCOUNTER — Encounter: Payer: Self-pay | Admitting: Cardiovascular Disease

## 2017-05-09 VITALS — BP 150/78 | HR 70 | Ht 77.0 in | Wt 204.0 lb

## 2017-05-09 DIAGNOSIS — N179 Acute kidney failure, unspecified: Secondary | ICD-10-CM

## 2017-05-09 DIAGNOSIS — I4892 Unspecified atrial flutter: Secondary | ICD-10-CM

## 2017-05-09 DIAGNOSIS — I5043 Acute on chronic combined systolic (congestive) and diastolic (congestive) heart failure: Secondary | ICD-10-CM

## 2017-05-09 DIAGNOSIS — I255 Ischemic cardiomyopathy: Secondary | ICD-10-CM | POA: Diagnosis not present

## 2017-05-09 DIAGNOSIS — I272 Pulmonary hypertension, unspecified: Secondary | ICD-10-CM

## 2017-05-09 NOTE — Patient Instructions (Addendum)
Medication Instructions:   Stay on the same meds  Labwork:  No new labs needed  Testing/Procedures:  We will order a limited echo for EF , Hx of cardiomyopathy, atrial flutter   Follow-Up: It was a pleasure seeing you in the office today. Please call us if you have new issues that need to be addressed before your next appt.  [phone]  Your physician wants you to follow-up in: 3 months.  You will receive a reminder letter in the mail two months in advance. If you don't receive a letter, please call our office to schedule the follow-up appointment.  If you need a refill on your cardiac medications before your next appointment, please call your pharmacy.  For educational health videos Log in to : [email or web address] Or : [email or web address], password : [email or web address] Echocardiogram An echocardiogram, or echocardiography, uses sound waves (ultrasound) to produce an image of your heart. The echocardiogram is simple, painless, obtained within a short period of time, and offers valuable information to your health care provider. The images from an echocardiogram can provide information such as:  Evidence of coronary artery disease (CAD).  Heart size.  Heart muscle function.  Heart valve function.  Aneurysm detection.  Evidence of a past heart attack.  Fluid buildup around the heart.  Heart muscle thickening.  Assess heart valve function.  Tell a health care provider about:  Any allergies you have.  All medicines you are taking, including vitamins, herbs, eye drops, creams, and over-the-counter medicines.  Any problems you or family members have had with anesthetic medicines.  Any blood disorders you have.  Any surgeries you have had.  Any medical conditions you have.  Whether you are pregnant or may be pregnant. What happens before the procedure? No special preparation is needed. Eat and drink normally. What happens during the procedure?  In order to produce an image of  your heart, gel will be applied to your chest and a wand-like tool (transducer) will be moved over your chest. The gel will help transmit the sound waves from the transducer. The sound waves will harmlessly bounce off your heart to allow the heart images to be captured in real-time motion. These images will then be recorded.  You may need an IV to receive a medicine that improves the quality of the pictures. What happens after the procedure? You may return to your normal schedule including diet, activities, and medicines, unless your health care provider tells you otherwise. This information is not intended to replace advice given to you by your health care provider. Make sure you discuss any questions you have with your health care provider. Document Released: 03/01/2000 Document Revised: 10/21/2015 Document Reviewed: 11/09/2012 Elsevier Interactive Patient Education  2017 [employer].

## 2017-05-13 ENCOUNTER — Other Ambulatory Visit: Payer: Self-pay | Admitting: Cardiovascular Disease

## 2017-05-13 DIAGNOSIS — I4892 Unspecified atrial flutter: Secondary | ICD-10-CM

## 2017-05-13 DIAGNOSIS — I255 Ischemic cardiomyopathy: Secondary | ICD-10-CM

## 2017-05-13 DIAGNOSIS — I272 Pulmonary hypertension, unspecified: Secondary | ICD-10-CM

## 2017-05-13 DIAGNOSIS — I5043 Acute on chronic combined systolic (congestive) and diastolic (congestive) heart failure: Secondary | ICD-10-CM

## 2017-05-21 ENCOUNTER — Ambulatory Visit (INDEPENDENT_AMBULATORY_CARE_PROVIDER_SITE_OTHER): Payer: Medicare HMO

## 2017-05-21 ENCOUNTER — Other Ambulatory Visit: Payer: Self-pay

## 2017-05-21 DIAGNOSIS — I255 Ischemic cardiomyopathy: Secondary | ICD-10-CM

## 2017-05-21 DIAGNOSIS — I4892 Unspecified atrial flutter: Secondary | ICD-10-CM

## 2017-05-21 DIAGNOSIS — I5043 Acute on chronic combined systolic (congestive) and diastolic (congestive) heart failure: Secondary | ICD-10-CM

## 2017-05-21 DIAGNOSIS — I272 Pulmonary hypertension, unspecified: Secondary | ICD-10-CM | POA: Diagnosis not present

## 2017-05-23 ENCOUNTER — Telehealth: Payer: Self-pay | Admitting: Cardiovascular Disease

## 2017-05-23 NOTE — Telephone Encounter (Signed)
Dr. Rockey Situ was made aware of my conversation with the patient.

## 2017-05-23 NOTE — Telephone Encounter (Signed)
Dr. Rockey Situ spoke with me directly about the patient's images- he reports that patient was recently [place] and as of his echo, he was back in a-trial flutter with a rate of 130 bpm. He advised the patient go to [facility] ER for further evaluation/ treatment as his EF is now 20-25%- if he refuses [facility], then [facility] ER for further evaluation. He will need an EP consult as well.  I called and spoke with the patient regarding his echo results. He is aware of Dr. Donivan Scull recommendations to go to the ER at [facility] in [place] so our EP team can evaluate him there. The patient declined stating that he cannot make it to [place]. I advised him to go to [facility] ER for further evaluation- he stated that he could not make it there today as he is at work- I advised him to go on [place] Sunday and he refused stating he has an appt with his PCP on Monday and that "I could go an a Tuesday/ Wednesday." I advised him if he will not go to the ER, then I am going to schedule him to come in to see Dr. [name] here on Tuesday 05/27/17 at 9:15 am- he states "I'll try to make that." I have made him aware, that if over the weekend, he becomes SOB/ or has edema/ swelling that develops, he should report immediately to the ER. He just said "ok." He currently does not appear to be in distress over the phone and states he cannot tell when he goes in/ out of rhythm.  Appt has been scheduled for 05/27/17 with Dr. [name].

## 2017-05-26 ENCOUNTER — Ambulatory Visit: Payer: Self-pay | Admitting: Family Medicine

## 2017-05-27 ENCOUNTER — Institutional Professional Consult (permissible substitution): Payer: Medicare HMO | Admitting: Internal Medicine

## 2017-06-02 ENCOUNTER — Other Ambulatory Visit: Payer: Self-pay

## 2017-06-02 ENCOUNTER — Telehealth: Payer: Self-pay | Admitting: Cardiovascular Disease

## 2017-06-02 MED ORDER — LOSARTAN POTASSIUM 50 MG PO TABS: 50.0000 mg | ORAL_TABLET | Freq: Every day | ORAL | 3 refills | Status: DC

## 2017-06-02 MED ORDER — ATORVASTATIN CALCIUM 40 MG PO TABS: 40.0000 mg | ORAL_TABLET | Freq: Every day | ORAL | 3 refills | Status: DC

## 2017-06-02 MED ORDER — LISINOPRIL 20 MG PO TABS: 20.0000 mg | ORAL_TABLET | Freq: Every day | ORAL | 3 refills | Status: DC

## 2017-06-02 MED ORDER — FUROSEMIDE 40 MG PO TABS: 40.0000 mg | ORAL_TABLET | Freq: Every day | ORAL | 3 refills | Status: DC

## 2017-06-02 NOTE — Telephone Encounter (Signed)
Spoke with patient and he states that they would not give him refill on his losartan due to recall. Reviewed information with patient and advised that I would send in lisinopril 20 mg once daily. Advised him to monitor blood pressures and to please give [place] a call if they are 140/90 or higher. He verbalized understanding with no further questions at this time.

## 2017-06-02 NOTE — Telephone Encounter (Signed)
*  STAT* If patient is at the pharmacy, call can be transferred to refill team.   1. Which medications need to be refilled? (please list name of each medication and dose if known) Atorvastatin 40 mg, Furosemide 40 mg, Losartan 50 mg,  2. Which pharmacy/location (including street and city if local pharmacy) is medication to be sent to? [place]  3. Do they need a 30 day or 90 day supply? 30 day

## 2017-06-02 NOTE — Telephone Encounter (Signed)
Pt calling stating he received a letter about a recall on Losartan   Would like to know what he needs to do about this  Please advise

## 2017-06-11 MED ORDER — RIVAROXABAN 20 MG PO TABS: 20.0000 mg | ORAL_TABLET | Freq: Every day | ORAL | 11 refills | Status: DC

## 2017-06-11 MED ORDER — METOPROLOL SUCCINATE ER 50 MG PO TB24: 50.0000 mg | ORAL_TABLET | Freq: Every day | ORAL | 11 refills | Status: DC

## 2017-06-11 NOTE — Telephone Encounter (Signed)
Pt calling stating the Lisinopril is not doing well for him  He states he was on the Losartan before and that was doing well for him But now that he is on Lisinopril he states in the mornings he will have dizzy spells along with weakness.  It's been like this the past few days  Would like advise on this  Please call back

## 2017-06-11 NOTE — Telephone Encounter (Signed)
Pt calling stating the xarelto we sent in is costing him $142 He states he can't afford this. He would like a call back with advise on how we may help him with this

## 2017-06-11 NOTE — Addendum Note (Signed)
Addended by: Valora Corporal on: 06/11/2017 09:23 AM   Modules accepted: Orders

## 2017-06-11 NOTE — Telephone Encounter (Signed)
Pt returning our call  ° °

## 2017-06-11 NOTE — Telephone Encounter (Addendum)
Spoke with patient and he reports that he has not been feeling well. Reviewed all medications and he does not have the metoprolol or xarelto. Refilled all prescriptions for him and advised to call back if his symptoms persist or worsen. He verbalized understanding with no further questions at this time.

## 2017-06-11 NOTE — Telephone Encounter (Signed)
Left voicemail message to call back  

## 2017-06-12 ENCOUNTER — Other Ambulatory Visit: Payer: Self-pay

## 2017-06-12 ENCOUNTER — Emergency Department: Payer: Medicare HMO

## 2017-06-12 ENCOUNTER — Inpatient Hospital Stay
Admission: EM | Admit: 2017-06-12 | Discharge: 2017-06-15 | DRG: 308 | Disposition: A | Discharge status: Other Acute Inpatient Hospital | Payer: Medicare HMO | Attending: Internal Medicine | Admitting: Internal Medicine

## 2017-06-12 DIAGNOSIS — N179 Acute kidney failure, unspecified: Secondary | ICD-10-CM | POA: Diagnosis present

## 2017-06-12 DIAGNOSIS — N289 Disorder of kidney and ureter, unspecified: Secondary | ICD-10-CM

## 2017-06-12 DIAGNOSIS — I5043 Acute on chronic combined systolic (congestive) and diastolic (congestive) heart failure: Secondary | ICD-10-CM | POA: Diagnosis present

## 2017-06-12 DIAGNOSIS — R0609 Other forms of dyspnea: Secondary | ICD-10-CM | POA: Diagnosis not present

## 2017-06-12 DIAGNOSIS — R0600 Dyspnea, unspecified: Secondary | ICD-10-CM | POA: Diagnosis not present

## 2017-06-12 DIAGNOSIS — R0602 Shortness of breath: Secondary | ICD-10-CM | POA: Diagnosis not present

## 2017-06-12 DIAGNOSIS — Z79899 Other long term (current) drug therapy: Secondary | ICD-10-CM

## 2017-06-12 DIAGNOSIS — I5023 Acute on chronic systolic (congestive) heart failure: Secondary | ICD-10-CM | POA: Diagnosis not present

## 2017-06-12 DIAGNOSIS — I272 Pulmonary hypertension, unspecified: Secondary | ICD-10-CM | POA: Diagnosis present

## 2017-06-12 DIAGNOSIS — R000 Tachycardia, unspecified: Secondary | ICD-10-CM

## 2017-06-12 DIAGNOSIS — Z87891 Personal history of nicotine dependence: Secondary | ICD-10-CM

## 2017-06-12 DIAGNOSIS — R778 Other specified abnormalities of plasma proteins: Secondary | ICD-10-CM

## 2017-06-12 DIAGNOSIS — R7989 Other specified abnormal findings of blood chemistry: Secondary | ICD-10-CM

## 2017-06-12 DIAGNOSIS — J9601 Acute respiratory failure with hypoxia: Secondary | ICD-10-CM | POA: Diagnosis present

## 2017-06-12 DIAGNOSIS — Z9114 Patient's other noncompliance with medication regimen: Secondary | ICD-10-CM | POA: Diagnosis not present

## 2017-06-12 DIAGNOSIS — I110 Hypertensive heart disease with heart failure: Secondary | ICD-10-CM | POA: Diagnosis present

## 2017-06-12 DIAGNOSIS — I248 Other forms of acute ischemic heart disease: Secondary | ICD-10-CM | POA: Diagnosis present

## 2017-06-12 DIAGNOSIS — Z9119 Patient's noncompliance with other medical treatment and regimen: Secondary | ICD-10-CM | POA: Diagnosis not present

## 2017-06-12 DIAGNOSIS — Z7901 Long term (current) use of anticoagulants: Secondary | ICD-10-CM | POA: Diagnosis not present

## 2017-06-12 DIAGNOSIS — I501 Left ventricular failure: Secondary | ICD-10-CM | POA: Diagnosis not present

## 2017-06-12 DIAGNOSIS — E876 Hypokalemia: Secondary | ICD-10-CM | POA: Diagnosis present

## 2017-06-12 DIAGNOSIS — I429 Cardiomyopathy, unspecified: Secondary | ICD-10-CM | POA: Diagnosis present

## 2017-06-12 DIAGNOSIS — I4892 Unspecified atrial flutter: Secondary | ICD-10-CM | POA: Diagnosis present

## 2017-06-12 DIAGNOSIS — T502X5A Adverse effect of carbonic-anhydrase inhibitors, benzothiadiazides and other diuretics, initial encounter: Secondary | ICD-10-CM | POA: Diagnosis present

## 2017-06-12 DIAGNOSIS — I5021 Acute systolic (congestive) heart failure: Secondary | ICD-10-CM | POA: Diagnosis not present

## 2017-06-12 DIAGNOSIS — I4891 Unspecified atrial fibrillation: Secondary | ICD-10-CM | POA: Diagnosis present

## 2017-06-12 DIAGNOSIS — R748 Abnormal levels of other serum enzymes: Secondary | ICD-10-CM | POA: Diagnosis not present

## 2017-06-12 HISTORY — DX: Pulmonary hypertension, unspecified: I27.20

## 2017-06-12 HISTORY — DX: Essential (primary) hypertension: I10

## 2017-06-12 HISTORY — DX: Chronic combined systolic (congestive) and diastolic (congestive) heart failure: I50.42

## 2017-06-12 LAB — BASIC METABOLIC PANEL
ANION GAP: 17 — AB (ref 5–15)
BUN: 29 mg/dL — ABNORMAL HIGH (ref 6–20)
CO2: 22 mmol/L (ref 22–32)
Calcium: 9.3 mg/dL (ref 8.9–10.3)
Chloride: 102 mmol/L (ref 101–111)
Creatinine, Ser: 1.78 mg/dL — ABNORMAL HIGH (ref 0.61–1.24)
GFR calc Af Amer: 44 mL/min — ABNORMAL LOW (ref >60)
GFR calc non Af Amer: 38 mL/min — ABNORMAL LOW (ref >60)
GLUCOSE: 129 mg/dL — AB (ref 65–99)
POTASSIUM: 3.4 mmol/L — AB (ref 3.5–5.1)
Sodium: 141 mmol/L (ref 135–145)

## 2017-06-12 LAB — CBC
HEMATOCRIT: 44 % (ref 40.0–52.0)
HEMOGLOBIN: 14.5 g/dL (ref 13.0–18.0)
MCH: 29.8 pg (ref 26.0–34.0)
MCHC: 33 g/dL (ref 32.0–36.0)
MCV: 90.3 fL (ref 80.0–100.0)
Platelets: 310 10*3/uL (ref 150–440)
RBC: 4.87 MIL/uL (ref 4.40–5.90)
RDW: 15.5 % — ABNORMAL HIGH (ref 11.5–14.5)
WBC: 6.7 10*3/uL (ref 3.8–10.6)

## 2017-06-12 LAB — PROTIME-INR
INR: 1.28
Prothrombin Time: 15.9 seconds — ABNORMAL HIGH (ref 11.4–15.2)

## 2017-06-12 LAB — BRAIN NATRIURETIC PEPTIDE: B NATRIURETIC PEPTIDE 5: 1839 pg/mL — AB (ref 0.0–100.0)

## 2017-06-12 LAB — TROPONIN I: Troponin I: 0.04 ng/mL (ref <0.03)

## 2017-06-12 LAB — APTT: aPTT: 28 seconds (ref 24–36)

## 2017-06-12 MED ORDER — SODIUM CHLORIDE 0.9 % IV SOLN: 250.0000 mL | INTRAVENOUS | Status: DC | PRN

## 2017-06-12 MED ORDER — ASPIRIN 81 MG PO CHEW: 324.0000 mg | CHEWABLE_TABLET | Freq: Once | ORAL | Status: DC

## 2017-06-12 MED ORDER — ATORVASTATIN CALCIUM 20 MG PO TABS
40.0000 mg | ORAL_TABLET | Freq: Every day | ORAL | Status: DC
  Administered 2017-06-13 – 2017-06-14 (×2): 40 mg via ORAL
  Filled 2017-06-12 (×2): qty 2

## 2017-06-12 MED ORDER — ALBUTEROL SULFATE (2.5 MG/3ML) 0.083% IN NEBU: 2.5000 mg | INHALATION_SOLUTION | RESPIRATORY_TRACT | Status: DC | PRN

## 2017-06-12 MED ORDER — HEPARIN (PORCINE) IN NACL 100-0.45 UNIT/ML-% IJ SOLN
1500.0000 [IU]/h | INTRAMUSCULAR | Status: DC
  Administered 2017-06-12: 1150 [IU]/h via INTRAVENOUS
  Administered 2017-06-13: 1300 [IU]/h via INTRAVENOUS
  Filled 2017-06-12 (×4): qty 250

## 2017-06-12 MED ORDER — FUROSEMIDE 10 MG/ML IJ SOLN
40.0000 mg | Freq: Two times a day (BID) | INTRAMUSCULAR | Status: DC
  Administered 2017-06-12 – 2017-06-15 (×6): 40 mg via INTRAVENOUS
  Filled 2017-06-12 (×6): qty 4

## 2017-06-12 MED ORDER — LISINOPRIL 20 MG PO TABS
20.0000 mg | ORAL_TABLET | Freq: Every day | ORAL | Status: DC
  Administered 2017-06-13: 20 mg via ORAL
  Filled 2017-06-12: qty 1

## 2017-06-12 MED ORDER — ACETAMINOPHEN 650 MG RE SUPP: 650.0000 mg | Freq: Four times a day (QID) | RECTAL | Status: DC | PRN

## 2017-06-12 MED ORDER — RIVAROXABAN 20 MG PO TABS: 20.0000 mg | ORAL_TABLET | Freq: Every day | ORAL | Status: DC

## 2017-06-12 MED ORDER — ONDANSETRON HCL 4 MG/2ML IJ SOLN: 4.0000 mg | Freq: Four times a day (QID) | INTRAMUSCULAR | Status: DC | PRN

## 2017-06-12 MED ORDER — SODIUM CHLORIDE 0.9% FLUSH
3.0000 mL | Freq: Two times a day (BID) | INTRAVENOUS | Status: DC
  Administered 2017-06-12 – 2017-06-15 (×5): 3 mL via INTRAVENOUS

## 2017-06-12 MED ORDER — SENNOSIDES-DOCUSATE SODIUM 8.6-50 MG PO TABS: 1.0000 | ORAL_TABLET | Freq: Every evening | ORAL | Status: DC | PRN

## 2017-06-12 MED ORDER — SODIUM CHLORIDE 0.9% FLUSH: 3.0000 mL | INTRAVENOUS | Status: DC | PRN

## 2017-06-12 MED ORDER — HYDROCODONE-ACETAMINOPHEN 5-325 MG PO TABS: 1.0000 | ORAL_TABLET | ORAL | Status: DC | PRN

## 2017-06-12 MED ORDER — METOPROLOL SUCCINATE ER 50 MG PO TB24
50.0000 mg | ORAL_TABLET | Freq: Every day | ORAL | Status: DC
  Administered 2017-06-13: 50 mg via ORAL
  Filled 2017-06-12: qty 1

## 2017-06-12 MED ORDER — HEPARIN BOLUS VIA INFUSION
4000.0000 [IU] | Freq: Once | INTRAVENOUS | Status: AC
  Administered 2017-06-12: 4000 [IU] via INTRAVENOUS
  Filled 2017-06-12: qty 4000

## 2017-06-12 MED ORDER — ONDANSETRON HCL 4 MG PO TABS: 4.0000 mg | ORAL_TABLET | Freq: Four times a day (QID) | ORAL | Status: DC | PRN

## 2017-06-12 MED ORDER — ACETAMINOPHEN 325 MG PO TABS: 650.0000 mg | ORAL_TABLET | Freq: Four times a day (QID) | ORAL | Status: DC | PRN

## 2017-06-12 MED ORDER — BISACODYL 5 MG PO TBEC: 5.0000 mg | DELAYED_RELEASE_TABLET | Freq: Every day | ORAL | Status: DC | PRN

## 2017-06-12 MED ORDER — METOPROLOL TARTRATE 5 MG/5ML IV SOLN
10.0000 mg | Freq: Once | INTRAVENOUS | Status: AC
  Administered 2017-06-12: 10 mg via INTRAVENOUS
  Filled 2017-06-12: qty 10

## 2017-06-12 NOTE — ED Provider Notes (Addendum)
[facility] Emergency Department Provider Note  ____________________________________________  Time seen: Approximately 7:46 PM  I have reviewed the triage vital signs and the nursing notes.   HISTORY  Chief Complaint Shortness of Breath    HPI Nathan [name]. is a 67 y.o. male with a history of atrial flutter presenting for exertional shortness of breath.  The patient reports that in February, there was a problem with his metoprolol, "it was recalled, and "so he got a new prescription for the same medication but feels that this medication does not work as well.  He states that since Monday, he has had exertional shortness of breath with decreased exercise tolerance.  Today, he developed a coughing spasm with associated central chest pain, which resolved when he stopped coughing.  He did not have any associated diaphoresis, lightheadedness or syncope.  He has not had any recent illness including cough or cold symptoms, nausea vomiting or diarrhea.  The patient was admitted to the hospital and discharged 05/01/17 after admission for a flutter with rapid ventricular response, positive troponin.  He was discharged on Xarelto and there were plans to undergo cardiac catheterization as an outpatient.  Past Medical History:  Diagnosis Date  . Atrial flutter [facility])     Patient Active Problem List   Diagnosis Date Noted  . Atrial flutter with rapid ventricular response ([place]) 04/29/2017  . Elevated troponin 04/29/2017  . AKI (acute kidney injury) ([place]) 04/29/2017    Past Surgical History:  Procedure Laterality Date  . CARDIOVERSION N/A 05/01/2017   Procedure: CARDIOVERSION;  Surgeon: [name], MD;  Location: [facility] ORS;  Service: Cardiovascular;  Laterality: N/A;  . TEE WITHOUT CARDIOVERSION N/A 05/01/2017   Procedure: TRANSESOPHAGEAL ECHOCARDIOGRAM (TEE);  Surgeon: [name], MD;  Location: [facility] ORS;  Service: Cardiovascular;  Laterality: N/A;     Current Outpatient Rx  . Order #: [id number] Class: Normal  . Order #: [id number] Class: Normal  . Order #: [id number] Class: Normal  . Order #: [id number] Class: Normal  . Order #: [id number] Class: Normal    Allergies Patient has no known allergies.  Family History  Problem Relation Age of Onset  . Alzheimer's disease Mother   . Alzheimer's disease Father     Social History Social History   Tobacco Use  . Smoking status: Former Research scientist (life sciences)  . Smokeless tobacco: Never Used  Substance Use Topics  . Alcohol use: Yes    Frequency: Never  . Drug use: Not on file    Review of Systems Constitutional: No fever/chills.  No lightheadedness or syncope. Eyes: No visual changes. ENT: No sore throat. No congestion or rhinorrhea. Cardiovascular: Denies chest pain. Denies palpitations. Respiratory: Positive exertional shortness of breath.  No cough.  Positive decreased exercise tolerance. Gastrointestinal: No abdominal pain.  No nausea, no vomiting.  No diarrhea.  No constipation. Genitourinary: Negative for dysuria. Musculoskeletal: Negative for back pain.  No lower extremity swelling or calf pain. Skin: Negative for rash. Neurological: Negative for headaches. No focal numbness, tingling or weakness.     ____________________________________________   PHYSICAL EXAM:  VITAL SIGNS: ED Triage Vitals  Enc Vitals Group     BP 06/12/17 1831 (!) 162/118     Pulse Rate 06/12/17 1831 87     Resp 06/12/17 1831 18     Temp 06/12/17 1831 97.6 F (36.4 C)     Temp Source 06/12/17 1831 Oral     SpO2 06/12/17 1831 94 %     Weight 06/12/17 1833 210 lb (95.3  kg)     Height 06/12/17 1833 6\' 5"  (1.956 m)     Head Circumference --      Peak Flow --      Pain Score 06/12/17 1833 0     Pain Loc --      Pain Edu? --      Excl. in [place]? --     Constitutional: Alert and oriented. Well appearing and in no acute distress. Answers questions appropriately. Eyes: Conjunctivae are normal.  EOMI. No  scleral icterus. Head: Atraumatic. Nose: No congestion/rhinnorhea. Mouth/Throat: Mucous membranes are moist.  Neck: No stridor.  Supple.  JVD.  No meningismus. Cardiovascular: Fast rate, regular rhythm. No murmurs, rubs or gallops.  She is hypertensive on my examination. Respiratory: Normal respiratory effort.  No accessory muscle use or retractions. Lungs CTAB.  No wheezes, rales or ronchi. Gastrointestinal: Soft, nontender and nondistended.  No guarding or rebound.  No peritoneal signs. Musculoskeletal: No LE edema. No ttp in the calves or palpable cords.  Negative Homan's sign. Neurologic:  A&Ox3.  Speech is clear.  Face and smile are symmetric.  EOMI.  Moves all extremities well. Skin:  Skin is warm, dry and intact. No rash noted. Psychiatric: Mood and affect are normal. Speech and behavior are normal.  Normal judgement.  ____________________________________________   LABS (all labs ordered are listed, but only abnormal results are displayed)  Labs Reviewed  BASIC METABOLIC PANEL - Abnormal; Notable for the following components:      Result Value   Potassium 3.4 (*)    Glucose, Bld 129 (*)    BUN 29 (*)    Creatinine, Ser 1.78 (*)    GFR calc non Af Amer 38 (*)    GFR calc Af Amer 44 (*)    Anion gap 17 (*)    All other components within normal limits  CBC - Abnormal; Notable for the following components:   RDW 15.5 (*)    All other components within normal limits  TROPONIN I - Abnormal; Notable for the following components:   Troponin I 0.04 (*)    All other components within normal limits  PROTIME-INR  APTT  BRAIN NATRIURETIC PEPTIDE   ____________________________________________  EKG  ED ECG REPORT I, Eula Listen, the attending physician, personally viewed and interpreted this ECG.   Date: 06/12/2017  EKG Time: 1833  Rate: 175  Rhythm: sinus tachycardia  Axis: normal  Intervals:none  ST&T Change: no  STEMI  ____________________________________________  RADIOLOGY  Dg Chest 2 View  Result Date: 06/12/2017 CLINICAL DATA:  Shortness of breath and fatigue EXAM: CHEST - 2 VIEW COMPARISON:  April 29, 2017 FINDINGS: There is no appreciable edema or consolidation. There is central interstitial prominence. Heart is slightly enlarged with pulmonary vascularity within normal limits. No adenopathy. No bone lesions. IMPRESSION: Central bronchitis. No edema or consolidation. Mild cardiac enlargement. Electronically Signed   By: Lowella Grip III M.D.   On: 06/12/2017 19:05    ____________________________________________   PROCEDURES  Procedure(s) performed: None  Procedures  Critical Care performed: No ____________________________________________   INITIAL IMPRESSION / ASSESSMENT AND PLAN / ED COURSE  Pertinent labs & imaging results that were available during my care of the patient were reviewed by me and considered in my medical decision making (see chart for details).  67 y.o. male with a history of a flutter who has had a recent change in his metoprolol and is presenting with exertional shortness of breath and decreased exercise tolerance.  On arrival to  the emergency department, he has a heart rate between 150s and 170s.  EKG shows a regular rhythm which is tachycardic, and I will treat him with IV metoprolol for sinus tachycardia versus atrial flutter with rapid ventricular rate.  The patient does have a troponin of 0.04 which will need to be trended and may be due to heart strain rather than ACS or MI.  The patient will be admitted to the hospital for further evaluation and treatment.  ________________________  FINAL CLINICAL IMPRESSION(S) / ED DIAGNOSES  Final diagnoses:  Elevated troponin  Sinus tachycardia  Exertional dyspnea         NEW MEDICATIONS STARTED DURING THIS VISIT:  New Prescriptions   No medications on file      Eula Listen, MD 06/12/17  1955    Eula Listen, MD 06/12/17 Lona Kettle    Eula Listen, MD 06/12/17 2001

## 2017-06-12 NOTE — Telephone Encounter (Signed)
Called patient back. He started saying he did not think "this new medication was working." He said, "I can't even get up and walk across the room without getting short of breath." He said he was planning right now to call 911. I advised patient that is what he should go ahead and do and we will sort out his medications later. He was agreeable and "calling rescue squad" now.

## 2017-06-12 NOTE — Telephone Encounter (Signed)
Patient returning call.

## 2017-06-12 NOTE — H&P (Signed)
[place] [facility] [place] NAME: Nathan [name]    MR#:  [id number]  DATE OF BIRTH:  [birth year]  DATE OF ADMISSION:  06/12/2017  PRIMARY CARE PHYSICIAN: Patient, [name]   REQUESTING/REFERRING PHYSICIAN: Dr. Mariea Clonts.  CHIEF COMPLAINT:   Chief Complaint  Patient presents with  . Shortness of Breath   Shortness of breath for 3 days. HISTORY OF PRESENT ILLNESS:  Nathan [name]  is a 67 y.o. male with a known history of A flutter.  He came to the ED due to worsening exertional shortness of breath for the past 3 days.  He has a history of a flutter, given Lopressor in February, which works well.  But the Lopressor was really cold and he got new type of Lopressor, which he think does not work well.  He also has orthopnea and some coughing spasms associated with chest tightness.  He denies any fever or chills.  He was found A. fib with RVR at 170s in the ED.  He was given 1 dose of IV Lopressor.  BMP is elevated at 1,839. chest x-ray showed congestion.  His  echocardiogram this month show ejection fraction of only 25%.  He is supposed to take Xarelto but he cannot afford it.  PAST MEDICAL HISTORY:   Past Medical History:  Diagnosis Date  . Atrial flutter ([place])     PAST SURGICAL HISTORY:   Past Surgical History:  Procedure Laterality Date  . CARDIOVERSION N/A 05/01/2017   Procedure: CARDIOVERSION;  Surgeon: [name], MD;  Location: [facility] ORS;  Service: Cardiovascular;  Laterality: N/A;  . TEE WITHOUT CARDIOVERSION N/A 05/01/2017   Procedure: TRANSESOPHAGEAL ECHOCARDIOGRAM (TEE);  Surgeon: [name], MD;  Location: [facility] ORS;  Service: Cardiovascular;  Laterality: N/A;    SOCIAL HISTORY:   Social History   Tobacco Use  . Smoking status: Former Research scientist (life sciences)  . Smokeless tobacco: Never Used  Substance Use Topics  . Alcohol use: Yes    Frequency: Never    FAMILY HISTORY:   Family History  Problem Relation Age of Onset  . Alzheimer's  disease Mother   . Alzheimer's disease Father     DRUG ALLERGIES:  No Known Allergies  REVIEW OF SYSTEMS:   Review of Systems  Constitutional: Positive for malaise/fatigue. Negative for chills and fever.  HENT: Negative for sore throat.   Eyes: Negative for blurred vision and double vision.  Respiratory: Positive for cough and shortness of breath. Negative for hemoptysis, sputum production, wheezing and stridor.   Cardiovascular: Negative for chest pain, palpitations, orthopnea and leg swelling.  Gastrointestinal: Negative for abdominal pain, blood in stool, diarrhea, melena, nausea and vomiting.  Genitourinary: Negative for dysuria, flank pain and hematuria.  Musculoskeletal: Negative for back pain and joint pain.  Skin: Negative for rash.  Neurological: Negative for dizziness, sensory change, focal weakness, seizures, loss of consciousness, weakness and headaches.  Endo/Heme/Allergies: Negative for polydipsia.  Psychiatric/Behavioral: Negative for depression. The patient is not nervous/anxious.     MEDICATIONS AT HOME:   Prior to Admission medications   Medication Sig Start Date End Date Taking? Authorizing Provider  atorvastatin (LIPITOR) 40 MG tablet Take 1 tablet (40 mg total) by mouth daily at 6 PM. 06/02/17  Yes Gollan, Kathlene November, MD  furosemide (LASIX) 40 MG tablet Take 1 tablet (40 mg total) by mouth daily. 06/02/17  Yes Gollan, Kathlene November, MD  lisinopril (PRINIVIL,ZESTRIL) 20 MG tablet Take 1 tablet (20 mg total) by  mouth daily. 06/02/17 08/31/17 Yes Gollan, Kathlene November, MD  metoprolol succinate (TOPROL-XL) 50 MG 24 hr tablet Take 1 tablet (50 mg total) by mouth daily. Take with or immediately following a meal. 06/11/17  Yes Gollan, Kathlene November, MD  rivaroxaban (XARELTO) 20 MG TABS tablet Take 1 tablet (20 mg total) by mouth daily. Patient not taking: Reported on 06/12/2017 06/11/17   [name], MD      VITAL SIGNS:  Blood pressure (!) 152/134, pulse (!) 119, temperature  97.6 F (36.4 C), temperature source Oral, resp. rate (!) 35, height 6\' 5"  (1.956 m), weight 210 lb (95.3 kg), SpO2 (!) 80 %.  PHYSICAL EXAMINATION:  Physical Exam  GENERAL:  67 y.o.-year-old patient lying in the bed with no acute distress.  EYES: Pupils equal, round, reactive to light and accommodation. No scleral icterus. Extraocular muscles intact.  HEENT: Head atraumatic, normocephalic. Oropharynx and nasopharynx clear.  NECK:  Supple, no jugular venous distention. No thyroid enlargement, no tenderness.  LUNGS: Normal breath sounds bilaterally, no wheezing, mild basilar rales, no rhonchi or crepitation. No use of accessory muscles of respiration.  CARDIOVASCULAR: S1, S2 normal. No murmurs, rubs, or gallops.  ABDOMEN: Soft, nontender, nondistended. Bowel sounds present. No organomegaly or mass.  EXTREMITIES: No cyanosis, or clubbing.  Trace leg edema. NEUROLOGIC: Cranial nerves II through XII are intact. Muscle strength 5/5 in all extremities. Sensation intact. Gait not checked.  PSYCHIATRIC: The patient is alert and oriented x 3.  SKIN: No obvious rash, lesion, or ulcer.   LABORATORY PANEL:   CBC Recent Labs  Lab 06/12/17 1839  WBC 6.7  HGB 14.5  HCT 44.0  PLT 310   ------------------------------------------------------------------------------------------------------------------  Chemistries  Recent Labs  Lab 06/12/17 1839  NA 141  K 3.4*  CL 102  CO2 22  GLUCOSE 129*  BUN 29*  CREATININE 1.78*  CALCIUM 9.3   ------------------------------------------------------------------------------------------------------------------  Cardiac Enzymes Recent Labs  Lab 06/12/17 1839  TROPONINI 0.04*   ------------------------------------------------------------------------------------------------------------------  RADIOLOGY:  Dg Chest 2 View  Result Date: 06/12/2017 CLINICAL DATA:  Shortness of breath and fatigue EXAM: CHEST - 2 VIEW COMPARISON:  April 29, 2017  FINDINGS: There is no appreciable edema or consolidation. There is central interstitial prominence. Heart is slightly enlarged with pulmonary vascularity within normal limits. No adenopathy. No bone lesions. IMPRESSION: Central bronchitis. No edema or consolidation. Mild cardiac enlargement. Electronically Signed   By: Lowella Grip III M.D.   On: 06/12/2017 19:05      IMPRESSION AND PLAN:   A. fib with RVR. The patient will be admitted to telemetry floor. He was treated with IV Lopressor 1 dose, continue p.o. Lopressor and start-drip.  Follow-up cardiology consult.  Acute respiratory failure with hypoxia due to acute systolic CHF. Oxygen by nasal cannula, NEB PRN. Start Lasix IV every 12 hours, CHF protocol, continue lisinopril and Lopressor.  Elevated troponin due to demanding ischemia.  Follow-up troponin and continue heparin drip.  Acute renal failure due to above. Follow-up BMP while on Lasix.  Hypokalemia.  Give potassium supplement, follow-up potassium and magnesium.  All the records are reviewed and case discussed with ED provider. Management plans discussed with the patient, family and they are in agreement.  CODE STATUS: Full code.  TOTAL TIME TAKING CARE OF THIS PATIENT: 56 minutes.    Demetrios Loll M.D on 06/12/2017 at 9:59 PM  Between 7am to 6pm - Pager - [phone]  After 6pm go to [email or web address] - Patent attorney  Hospitalists  Office  [phone]  CC: Primary care physician; Patient, [name]   Note: This dictation was prepared with Dragon dictation along with smaller phrase technology. Any transcriptional errors that result from this process are unin

## 2017-06-12 NOTE — Progress Notes (Signed)
Advanced Care Plan.  Purpose of Encounter: CODE STATUS. Parties in Attendance: The patient and me. Patient's Decisional Capacity: Yes. Medical Story: Nathan [name]  is a 67 y.o. male with a known history of A flutter.  He came to the ED due to worsening exertional shortness of breath for the past 3 days.  He is found A. fib with RVR and acute respiratory failure with hypoxia due to systolic CHF with ejection fraction of only 25%.  I discussed the patient's condition, high risk for cardiac arrest due to low ejection fraction systolic CHF and A Fib, poor prognosis.  He voiced understanding and wants full code. Plan:  Code Status: Full code. Time spent discussing advance care planning: 18 minutes.

## 2017-06-12 NOTE — ED Notes (Signed)
Attempted IV x2, able to get flash, but unable to thread either IV at this time.

## 2017-06-12 NOTE — ED Triage Notes (Signed)
FIRST NURSE NOTE-here for difficulty breathing. No acute distress at check in time. sats 96% RA. Alert.

## 2017-06-12 NOTE — Telephone Encounter (Signed)
Pt is calling back regarding the status of his Xarelto. Please call to advise

## 2017-06-12 NOTE — Telephone Encounter (Signed)
No answer. Left message to call back.   

## 2017-06-12 NOTE — ED Triage Notes (Addendum)
Pt states last month doctor DC'd lasix and maybe lisinopril (pt doesn't know). Pt states he has been SOB. States walking to bathroom makes him SOB. Denies SOB when lying flat. Denies pain. Pt denies any weight gain or extremity swelling.

## 2017-06-12 NOTE — ED Notes (Signed)
Charge nurse notified of troponin 0.04; compared past results and will take pt to next available exam room

## 2017-06-12 NOTE — Progress Notes (Signed)
ANTICOAGULATION CONSULT NOTE - Initial Consult  Pharmacy Consult for heparin Indication: atrial fibrillation  No Known Allergies  Patient Measurements: Height: 6\' 5"  (195.6 cm) Weight: 210 lb (95.3 kg) IBW/kg (Calculated) : 89.1 Heparin Dosing Weight: 95.3 kg  Vital Signs: Temp: 97.6 F (36.4 C) (03/28 1831) Temp Source: Oral (03/28 1831) BP: 152/134 (03/28 2148) Pulse Rate: 119 (03/28 2148)  Labs: Recent Labs    06/12/17 1839 06/12/17 2002  HGB 14.5  --   HCT 44.0  --   PLT 310  --   APTT  --  28  LABPROT  --  15.9*  INR  --  1.28  CREATININE 1.78*  --   TROPONINI 0.04*  --     Estimated Creatinine Clearance: 51.4 mL/min (A) (by C-G formula based on SCr of 1.78 mg/dL (H)).   Medical History: Past Medical History:  Diagnosis Date  . Atrial flutter ([facility])     Medications:  Scheduled:  . [START ON 06/13/2017] atorvastatin  40 mg Oral q1800  . furosemide  40 mg Intravenous Q12H  . heparin  4,000 Units Intravenous Once  . [START ON 06/13/2017] lisinopril  20 mg Oral Daily  . [START ON 06/13/2017] metoprolol succinate  50 mg Oral Daily  . sodium chloride flush  3 mL Intravenous Q12H    Assessment: Patient admitted for SOB w/ h/o aflutter; is suppose to be anticoagulated w/ xarelto PTA; however, patient cannot afford medication and has not been taking it. Baseline labs confirm patient is not anticoagulated, will be started on heparin drip in-house for anticoagulation for afib.  Goal of Therapy:  Heparin level 0.3-0.7 units/ml Monitor platelets by anticoagulation protocol: Yes   Plan:  Will bolus w/ heparin 4000 units IV x 1 Will start heparin drip @ 1150 units/hr  Will draw heparin level w/ am labs 6 hours post-start Baseline labs WNL Will monitor daily CBC's and adjust per anti-Xa's  Tobie Lords, PharmD, BCPS Clinical Pharmacist 06/12/2017

## 2017-06-13 ENCOUNTER — Encounter: Payer: Self-pay | Admitting: Physician Assistant

## 2017-06-13 DIAGNOSIS — R0609 Other forms of dyspnea: Secondary | ICD-10-CM

## 2017-06-13 DIAGNOSIS — N289 Disorder of kidney and ureter, unspecified: Secondary | ICD-10-CM

## 2017-06-13 DIAGNOSIS — I501 Left ventricular failure: Secondary | ICD-10-CM

## 2017-06-13 DIAGNOSIS — I4892 Unspecified atrial flutter: Secondary | ICD-10-CM

## 2017-06-13 DIAGNOSIS — R748 Abnormal levels of other serum enzymes: Secondary | ICD-10-CM

## 2017-06-13 LAB — BASIC METABOLIC PANEL
ANION GAP: 17 — AB (ref 5–15)
BUN: 34 mg/dL — ABNORMAL HIGH (ref 6–20)
CHLORIDE: 103 mmol/L (ref 101–111)
CO2: 20 mmol/L — AB (ref 22–32)
Calcium: 9.1 mg/dL (ref 8.9–10.3)
Creatinine, Ser: 1.83 mg/dL — ABNORMAL HIGH (ref 0.61–1.24)
GFR calc non Af Amer: 37 mL/min — ABNORMAL LOW (ref >60)
GFR, EST AFRICAN AMERICAN: 43 mL/min — AB (ref >60)
Glucose, Bld: 124 mg/dL — ABNORMAL HIGH (ref 65–99)
POTASSIUM: 3.9 mmol/L (ref 3.5–5.1)
Sodium: 140 mmol/L (ref 135–145)

## 2017-06-13 LAB — CBC
HCT: 42.9 % (ref 40.0–52.0)
HEMOGLOBIN: 13.9 g/dL (ref 13.0–18.0)
MCH: 29.5 pg (ref 26.0–34.0)
MCHC: 32.4 g/dL (ref 32.0–36.0)
MCV: 90.9 fL (ref 80.0–100.0)
Platelets: 307 10*3/uL (ref 150–440)
RBC: 4.71 MIL/uL (ref 4.40–5.90)
RDW: 15.7 % — ABNORMAL HIGH (ref 11.5–14.5)
WBC: 7.6 10*3/uL (ref 3.8–10.6)

## 2017-06-13 LAB — MAGNESIUM: MAGNESIUM: 2 mg/dL (ref 1.7–2.4)

## 2017-06-13 LAB — HEPARIN LEVEL (UNFRACTIONATED)
HEPARIN UNFRACTIONATED: 0.43 [IU]/mL (ref 0.30–0.70)
HEPARIN UNFRACTIONATED: 0.41 [IU]/mL (ref 0.30–0.70)
Heparin Unfractionated: 0.23 IU/mL — ABNORMAL LOW (ref 0.30–0.70)

## 2017-06-13 LAB — TROPONIN I: Troponin I: 0.06 ng/mL (ref <0.03)

## 2017-06-13 MED ORDER — HEPARIN BOLUS VIA INFUSION
1400.0000 [IU] | Freq: Once | INTRAVENOUS | Status: AC
  Administered 2017-06-13: 1400 [IU] via INTRAVENOUS
  Filled 2017-06-13: qty 1400

## 2017-06-13 MED ORDER — METOPROLOL TARTRATE 25 MG PO TABS
25.0000 mg | ORAL_TABLET | Freq: Four times a day (QID) | ORAL | Status: DC
  Administered 2017-06-13 – 2017-06-14 (×5): 25 mg via ORAL
  Filled 2017-06-13 (×5): qty 1

## 2017-06-13 MED ORDER — METOPROLOL TARTRATE 5 MG/5ML IV SOLN
5.0000 mg | Freq: Four times a day (QID) | INTRAVENOUS | Status: DC | PRN
  Administered 2017-06-13: 5 mg via INTRAVENOUS
  Filled 2017-06-13: qty 5

## 2017-06-13 MED ORDER — DILTIAZEM HCL 30 MG PO TABS
30.0000 mg | ORAL_TABLET | Freq: Three times a day (TID) | ORAL | Status: DC
  Administered 2017-06-13 – 2017-06-14 (×3): 30 mg via ORAL
  Filled 2017-06-13 (×3): qty 1

## 2017-06-13 MED ORDER — DILTIAZEM HCL 30 MG PO TABS
30.0000 mg | ORAL_TABLET | Freq: Four times a day (QID) | ORAL | Status: DC
  Administered 2017-06-13: 30 mg via ORAL
  Filled 2017-06-13: qty 1

## 2017-06-13 NOTE — Progress Notes (Signed)
ANTICOAGULATION CONSULT NOTE - Initial Consult  Pharmacy Consult for heparin Indication: atrial fibrillation  No Known Allergies  Patient Measurements: Height: 6\' 5"  (195.6 cm) Weight: 237 lb (107.5 kg) IBW/kg (Calculated) : 89.1 Heparin Dosing Weight: 95.3 kg  Vital Signs: Temp: 97.6 F (36.4 C) (03/29 0420) Temp Source: Oral (03/28 1831) BP: 144/108 (03/29 0420) Pulse Rate: 128 (03/29 0420)  Labs: Recent Labs    06/12/17 1839 06/12/17 2002 06/12/17 2223 06/13/17 0542  HGB 14.5  --   --  13.9  HCT 44.0  --   --  42.9  PLT 310  --   --  307  APTT  --  28  --   --   LABPROT  --  15.9*  --   --   INR  --  1.28  --   --   HEPARINUNFRC  --   --   --  0.23*  CREATININE 1.78*  --   --  1.83*  TROPONINI 0.04*  --  0.06*  --     Estimated Creatinine Clearance: 54.2 mL/min (A) (by C-G formula based on SCr of 1.83 mg/dL (H)).   Medical History: Past Medical History:  Diagnosis Date  . Atrial flutter ([facility])     Medications:  Scheduled:  . atorvastatin  40 mg Oral q1800  . furosemide  40 mg Intravenous Q12H  . heparin  1,400 Units Intravenous Once  . lisinopril  20 mg Oral Daily  . metoprolol succinate  50 mg Oral Daily  . sodium chloride flush  3 mL Intravenous Q12H    Assessment: Patient admitted for SOB w/ h/o aflutter; is suppose to be anticoagulated w/ xarelto PTA; however, patient cannot afford medication and has not been taking it. Baseline labs confirm patient is not anticoagulated, will be started on heparin drip in-house for anticoagulation for afib.  Goal of Therapy:  Heparin level 0.3-0.7 units/ml Monitor platelets by anticoagulation protocol: Yes   Plan:  Will bolus w/ heparin 4000 units IV x 1 Will start heparin drip @ 1150 units/hr  Will draw heparin level w/ am labs 6 hours post-start Baseline labs WNL Will monitor daily CBC's and adjust per anti-Xa's  03/29 @ 0600 HL 0.23 subtherapeutic. Will rebolus w/ heparin 1400 units IV x 1 and increase  rate to 1300 units/hr and will recheck anti-Xa @ 1200, CBC stable.  Tobie Lords, PharmD, BCPS Clinical Pharmacist 06/13/2017

## 2017-06-13 NOTE — Progress Notes (Signed)
[facility] at [facility]                                                                                                                                                                                  Patient Demographics   Nathan [name], is a 67 y.o. male, DOB - 01/02/1951, LKT:[id number]  Admit date - 06/12/2017   Admitting Physician Demetrios Loll, MD  Outpatient Primary MD for the patient is Patient, [name]   LOS - 1  Subjective: Patient seen and evaluated by me today Has shortness of breath on exertion Currently on oxygen via nasal cannula No complaints of any chest pain Has palpitations on and off  Review of Systems:   CONSTITUTIONAL: No documented fever. No fatigue, weakness. No weight gain, no weight loss.  EYES: No blurry or double vision.  ENT: No tinnitus. No postnasal drip. No redness of the oropharynx.  RESPIRATORY: No cough, no wheeze, no hemoptysis. No dyspnea.  CARDIOVASCULAR: No chest pain. Has orthopnea. Has palpitations. No syncope.  GASTROINTESTINAL: No nausea, no vomiting or diarrhea. No abdominal pain. No melena or hematochezia.  GENITOURINARY: No dysuria or hematuria.  ENDOCRINE: No polyuria or nocturia. No heat or cold intolerance.  HEMATOLOGY: No anemia. No bruising. No bleeding.  INTEGUMENTARY: No rashes. No lesions.  MUSCULOSKELETAL: No arthritis. No swelling. No gout.  NEUROLOGIC: No numbness, tingling, or ataxia. No seizure-type activity.  PSYCHIATRIC: No anxiety. No insomnia. No ADD.    Vitals:   Vitals:   06/13/17 0951 06/13/17 1137 06/13/17 1140 06/13/17 1241  BP: (!) 136/104 (!) 141/122 (!) 126/112 (!) 138/116  Pulse: (!) 124 (!) 125 (!) 123 (!) 125  Resp:      Temp:      TempSrc:      SpO2:    100%  Weight:      Height:        Wt Readings from Last 3 Encounters:  06/13/17 107.5 kg (237 lb)  05/09/17 92.5 kg (204 lb)  05/01/17 106.6 kg (235 lb)     Intake/Output Summary (Last 24 hours) at 06/13/2017  1316 Last data filed at 06/13/2017 1221 Gross per 24 hour  Intake 240 ml  Output 375 ml  Net -135 ml    Physical Exam:   GENERAL: Pleasant-appearing in no apparent distress.  HEAD, EYES, EARS, NOSE AND THROAT: Atraumatic, normocephalic. Extraocular muscles are intact. Pupils equal and reactive to light. Sclerae anicteric. No conjunctival injection. No oro-pharyngeal erythema.  NECK: Supple. There is no jugular venous distention. No bruits, no lymphadenopathy, no thyromegaly.  HEART: s1s2 irregular,. No murmurs, no rubs, no clicks.  LUNGS: Bilateral decreased airflow, bibasilar crepitations heard.  No wheezes.  ABDOMEN: Soft, flat, nontender, nondistended. Has good bowel sounds. No hepatosplenomegaly appreciated.  EXTREMITIES: No evidence of any cyanosis, clubbing, or peripheral edema.  +2 pedal and radial pulses bilaterally.  Has 1+ pedal edema NEUROLOGIC: The patient is alert, awake, and oriented x3 with no focal motor or sensory deficits appreciated bilaterally.  SKIN: Moist and warm with no rashes appreciated.  Psych: Not anxious, depressed LN: No inguinal LN enlargement    Antibiotics   Anti-infectives (From admission, onward)   None      Medications   Scheduled Meds: . atorvastatin  40 mg Oral q1800  . diltiazem  30 mg Oral Q8H  . furosemide  40 mg Intravenous Q12H  . metoprolol tartrate  25 mg Oral Q6H  . sodium chloride flush  3 mL Intravenous Q12H   Continuous Infusions: . sodium chloride    . heparin 1,300 Units/hr (06/13/17 0652)   PRN Meds:.sodium chloride, acetaminophen **OR** acetaminophen, albuterol, bisacodyl, HYDROcodone-acetaminophen, metoprolol tartrate, ondansetron **OR** ondansetron (ZOFRAN) IV, senna-docusate, sodium chloride flush   Data Review:   Micro Results No results found for this or any previous visit (from the past 240 hour(s)).  Radiology Reports Dg Chest 2 View  Result Date: 06/12/2017 CLINICAL DATA:  Shortness of breath and fatigue  EXAM: CHEST - 2 VIEW COMPARISON:  April 29, 2017 FINDINGS: There is no appreciable edema or consolidation. There is central interstitial prominence. Heart is slightly enlarged with pulmonary vascularity within normal limits. No adenopathy. No bone lesions. IMPRESSION: Central bronchitis. No edema or consolidation. Mild cardiac enlargement. Electronically Signed   By: Lowella Grip III M.D.   On: 06/12/2017 19:05     CBC Recent Labs  Lab 06/12/17 1839 06/13/17 0542  WBC 6.7 7.6  HGB 14.5 13.9  HCT 44.0 42.9  PLT 310 307  MCV 90.3 90.9  MCH 29.8 29.5  MCHC 33.0 32.4  RDW 15.5* 15.7*    Chemistries  Recent Labs  Lab 06/12/17 1839 06/12/17 2223 06/13/17 0542  NA 141  --  140  K 3.4*  --  3.9  CL 102  --  103  CO2 22  --  20*  GLUCOSE 129*  --  124*  BUN 29*  --  34*  CREATININE 1.78*  --  1.83*  CALCIUM 9.3  --  9.1  MG  --  2.0  --    ------------------------------------------------------------------------------------------------------------------ estimated creatinine clearance is 54.2 mL/min (A) (by C-G formula based on SCr of 1.83 mg/dL (H)). ------------------------------------------------------------------------------------------------------------------ No results for input(s): HGBA1C in the last 72 hours. ------------------------------------------------------------------------------------------------------------------ No results for input(s): CHOL, HDL, LDLCALC, TRIG, CHOLHDL, LDLDIRECT in the last 72 hours. ------------------------------------------------------------------------------------------------------------------ No results for input(s): TSH, T4TOTAL, T3FREE, THYROIDAB in the last 72 hours.  Invalid input(s): FREET3 ------------------------------------------------------------------------------------------------------------------ No results for input(s): VITAMINB12, FOLATE, FERRITIN, TIBC, IRON, RETICCTPCT in the last 72 hours.  Coagulation  profile Recent Labs  Lab 06/12/17 2002  INR 1.28    No results for input(s): DDIMER in the last 72 hours.  Cardiac Enzymes Recent Labs  Lab 06/12/17 1839 06/12/17 2223  TROPONINI 0.04* 0.06*   ------------------------------------------------------------------------------------------------------------------ Invalid input(s): POCBNP    Assessment & Plan  67 year old male patient with history of atrial flutter currently under hospitalist service for shortness of breath and atrial fibrillation with rapid rate.  1.  Atrial fibrillation with rapid rate Oral Cardizem will be started for rate control PRN IV metoprolol Discussed case with the cardiology attending Dr. Rockey Situ EP studies recommended by cardiology in the past  but patient was noncompliant.  2.  Acute respiratory distress with hypoxia Continue oxygen via nasal cannula  3.  Systolic heart failure Continue Lasix for diuresis Further recommendations by cardiology  4.  Elevated troponin secondary to demand ischemia  5.  DVT prophylaxis Currently on anticoagulation with IV heparin drip      Code Status Orders  (From admission, onward)        Start     Ordered   06/12/17 2143  Full code  Continuous     06/12/17 2142    Code Status History    Date Active Date Inactive Code Status Order ID Comments User Context   04/29/2017 2333 05/01/2017 1920 Full Code [id number]  Lance Coon, MD Inpatient      Time Spent in minutes   35  Greater than 50% of time spent in care coordination and counseling patient regarding the condition and plan of care.   Saundra Shelling M.D on 06/13/2017 at 1:16 PM  Between 7am to 6pm - Pager - [phone]  After 6pm go to [email or web address] - Proofreader  [employer]   Office  [phone]

## 2017-06-13 NOTE — Progress Notes (Addendum)
67 year old male who presented to the ED with worsening SOB x 3 days. Patient has a known hx of aflutter.  He came to the ED due to worsening exertional shortness of breath for the past 3 days. Patient also c/o  orthopnea and some coughing spasms associated with chest tightness.  In the ED he was found to have A. fib with RVR at 170s and was given 1 dose of IV Lopressor.  BNP 1,839 and chest x-ray showed congestion.  Recent echo revealed EF of  25%.   Current active problem list:  1. A-fib with RVR 2. Acute respiratory distress with hypoxia 3. Systolic heart failure 4. Elevated troponin secondary to demand ischemia  CHF Education:  Educational session with patient completed.?? Provided patient with "Living Better with Heart Failure" packet. Briefly reviewed definition of heart failure and signs and symptoms of an exacerbation. Discussed the meaning of EF with patient.  Explained to patient that HF is a chronic illness that must be self assessed / self managed along with help from physician.    *Reviewed importance of and reason behind checking weight daily in the AM, after using the bathroom, but before getting dressed. Patient has scales that we supplied to him during previous admission.  Patient reporting scales do not work, but he has not investigated as to why that is.  As a result he has not been weighing himself. This RN informed patient that scales come on once you step on them.  Patient assured this RN that he would check this out when he returned home.    Reviewed the following information with patient:  *Discussed when to call the Dr= weight gain of >2lb overnight of 5lb in a week,  *Discussed yellow zone= call MD: weight gain of >2lb overnight of 5lb in a week, increased swelling, increased SOB when lying down, chest discomfort, dizziness, increased fatigue. ? *Red Zone= call 911: struggle to breath, fainting or near fainting, significant chest pain.   *Reviewed low sodium diet-provided  handout of recommended and not recommended foods. Reviewed reading labels with patient. Discussed fluid intake with patient as well. Patient not currently on a fluid restriction, but advised no more than 8-8 ounces glass of fluids per day.?Patient works at [employer] -  a [employer].  Patient has worked there for years.  Patient works on Mondays; off Tues, [place], and Thursday; works Fridays 7 a.m. - 11 a.m. And works all day on Saturdays.  Dietitian Consultation for Diet Education entered.    *Instructed patient to take medications as prescribed for heart failure. Explained briefly why pt is on the medications (either make you feel better, live longer or keep you out of the hospital) and discussed monitoring and side effects.   *Discussed exercise. Patient informed this RN that he is active and does not just sit around, as he cannot just sit.  Instructed patient to be as active as possible.   ? *Smoking Cessation -  patient former smoker.  Patient reports that he quit smoking in January 2019.   ? *[facility] patient appointment scheduled for patient in the [facility] for Tuesday, June 24, 2017 at 11 a.m. Patient reported that he uses [facility] for transportation.  This RN contacted [facility] at [phone].  [facility] informed this RN that hey have never transported this patient.   Note:  Patient needs assistance with transportation to medical appointments.    Upon returning to patient's room and informing patient that  [facility] indicated that they have never transported him to a medical appointment, patient admitted that he had contacted them at one point, but they had seemed "wishy washy".  Patient also stated that he did not understand what he needed to do.  This RN contacted [facility] and was informed that patient must be over 19 years of age in order to use [facility] and that [facility] was for transportation to medical appointments only.  Patient qualifies for [employer]ide which costs $3.00 each way  - $6 round trip.  Sharyn Lull at [facility] will fax application to [facility] @ [phone] today or Monday morning.  This RN will follow-up with Sharyn Lull @ [facility] on Monday morning.  If patient is discharged by that time, then application will have to be mailed to patient's home address.  Home address and emergency contact provided to [facility] [place].     Roanna Epley, RN, BSN, [facility]? [facility]  Cardiovascular &?Pulmonary Nurse Navigator  Direct Line: [phone]  Department Phone #: [phone] Fax: [phone]? Email Address: [email or web address]

## 2017-06-13 NOTE — Progress Notes (Addendum)
  0850:Patient's bp was 149/125  administered metoprolol and lisinopril dose.    0950: bp 136/104. HR 124. Dr. Estanislado Pandy on the floor and made aware. Orders for diltiazem 30 mg. Will continue to monitor.

## 2017-06-13 NOTE — Progress Notes (Signed)
Patient's heart rate remains elevated in the 120s bpm. Will add back diltiazem 30 mg q 8 hours. Would look to taper off diltiazem when able given his CM.

## 2017-06-13 NOTE — Consult Note (Signed)
Cardiology Consultation:   Patient ID: Nathan [name].; [id number]; November 10, 1950   Admit date: 06/12/2017 Date of Consult: 06/13/2017  Primary Care Provider: Patient, [name] Primary Cardiologist: Rockey Situ   Patient Profile:   Nathan [name]. is a 67 y.o. male with a hx of chronic combined systolic and diastolic CHF diagnosed in 04/2017, atrial flutter s/p TEE/DCCV in 04/2017 with redevelopment of atrial flutter by 05/21/2017, pulmonary hypertension, and tobacco abuse who is being seen today for the evaluation of acute on chronic systolic CHF and Aflutter with RVR at the request of Dr. Bridgett Larsson.  History of Present Illness:   [name] was recently admitted to [facility] in 04/2017 with new onset atrial flutter with RVR and acute combined CHF with pulmonary HTN. Echo on 04/30/17 showed an EF of 202-5%, diffuse hypokinesis, moderately dilated left atrium, mildly reduced RV systolic function, PASP 42 mmHg. He was adequately diuresed. He underwent TEE/DCCV on 05/01/17 that was successful. He was placed on evidence-based heart failure medications. In follow up on 2/22 he was noted to have a weight 204 pounds and was maintaining sinus rhythm. He underwent repeat echo on 3/6 to evaluate for improvement in his EF following restoration of sinus rhythm that showed he had redeveloped atrial flutter with RVR with an EF of 20-25%, diffuse hypokinesis. He was referred to EP with an appointment scheduled on 3/12 at 9:15 AM. He was a no-show for that appointment. He has since ran out of his Xarelto and has not taken any for the past 2 weeks. His losartan was changed to lisinopril due to the [facility] recalls.   Over the past several days to weeks he has noticed increased SOB and palpitations. No chest pain. He reports compliance with medications outside of Xarelto as he reports he could not afford it. Due to his continued SOB he presented to [facility] where he was noted to be in atrial flutter with RVR with heart rates in the 170s bpm.  CXR showed mild cardiomegaly without edema or consolidation. BNP 1839, troponin 0.04-->0.06. SCr 1.78-->1.83, K+ 3.4-->3.9. Weight 237 pounds. BP into the 283T to 517O systolic. He was placed on supplemental oxygen via nasal cannula. He has been given IV Lasix 40 mg x 2 and metoprolol Documented UOP of 375 mL for the past 24 hours. Remains in atrial flutter with RVR this morning with heart rates in the 120s bpm.    Past Medical History:  Diagnosis Date  . Atrial flutter ([place])    a. s/p TEE/DCCV 04/2017; b. CHADS2VASc => 3 (CHF, HTN, age x 1); c. not compliant with Xarelto  . Chronic combined systolic and diastolic CHF (congestive heart failure) ([place])    a. TTE 2/19: EF 20-25%, diffuse HK, mod dilated LA, mildly reduced RVSF, PASP 42; b. TTE 3/19: EF 20-25%, diffuse HK, RVSF nl, atrial flutter with RVR  . Essential hypertension   . Pulmonary hypertension ([place])     Past Surgical History:  Procedure Laterality Date  . CARDIOVERSION N/A 05/01/2017   Procedure: CARDIOVERSION;  Surgeon: [name], MD;  Location: [facility] ORS;  Service: Cardiovascular;  Laterality: N/A;  . TEE WITHOUT CARDIOVERSION N/A 05/01/2017   Procedure: TRANSESOPHAGEAL ECHOCARDIOGRAM (TEE);  Surgeon: [name], MD;  Location: [facility] ORS;  Service: Cardiovascular;  Laterality: N/A;     Home Meds: Prior to Admission medications   Medication Sig Start Date End Date Taking? Authorizing Provider  atorvastatin (LIPITOR) 40 MG tablet Take 1 tablet (40 mg total) by  mouth daily at 6 PM. 06/02/17  Yes Gollan, Kathlene November, MD  furosemide (LASIX) 40 MG tablet Take 1 tablet (40 mg total) by mouth daily. 06/02/17  Yes Gollan, Kathlene November, MD  lisinopril (PRINIVIL,ZESTRIL) 20 MG tablet Take 1 tablet (20 mg total) by mouth daily. 06/02/17 08/31/17 Yes Gollan, Kathlene November, MD  metoprolol succinate (TOPROL-XL) 50 MG 24 hr tablet Take 1 tablet (50 mg total) by mouth daily. Take with or immediately following a meal. 06/11/17  Yes Gollan, Kathlene November, MD  rivaroxaban (XARELTO) 20 MG TABS tablet Take 1 tablet (20 mg total) by mouth daily. Patient not taking: Reported on 06/12/2017 06/11/17   [name], MD    Inpatient Medications: Scheduled Meds: . atorvastatin  40 mg Oral q1800  . diltiazem  30 mg Oral Q6H  . furosemide  40 mg Intravenous Q12H  . lisinopril  20 mg Oral Daily  . metoprolol succinate  50 mg Oral Daily  . sodium chloride flush  3 mL Intravenous Q12H   Continuous Infusions: . sodium chloride    . heparin 1,300 Units/hr (06/13/17 7793)   PRN Meds: sodium chloride, acetaminophen **OR** acetaminophen, albuterol, bisacodyl, HYDROcodone-acetaminophen, metoprolol tartrate, ondansetron **OR** ondansetron (ZOFRAN) IV, senna-docusate, sodium chloride flush  Allergies:  No Known Allergies  Social History:   Social History   Socioeconomic History  . Marital status: Single    Spouse name: Not on file  . Number of children: Not on file  . Years of education: Not on file  . Highest education level: Not on file  Occupational History  . Not on file  Social Needs  . Financial resource strain: Not on file  . Food insecurity:    Worry: Not on file    Inability: Not on file  . Transportation needs:    Medical: Not on file    Non-medical: Not on file  Tobacco Use  . Smoking status: Former Research scientist (life sciences)  . Smokeless tobacco: Never Used  Substance and Sexual Activity  . Alcohol use: Yes    Frequency: Never  . Drug use: Not on file  . Sexual activity: Not on file  Lifestyle  . Physical activity:    Days per week: Not on file    Minutes per session: Not on file  . Stress: Not on file  Relationships  . Social connections:    Talks on phone: Not on file    Gets together: Not on file    Attends religious service: Not on file    Active member of club or organization: Not on file    Attends meetings of clubs or organizations: Not on file    Relationship status: Not on file  . Intimate partner violence:    Fear of  current or ex partner: Not on file    Emotionally abused: Not on file    Physically abused: Not on file    Forced sexual activity: Not on file  Other Topics Concern  . Not on file  Social History Narrative  . Not on file     Family History:   Family History  Problem Relation Age of Onset  . Alzheimer's disease Mother   . Alzheimer's disease Father     ROS:  Review of Systems  Constitutional: Positive for malaise/fatigue. Negative for chills, diaphoresis, fever and weight loss.  HENT: Negative for congestion.   Eyes: Negative for discharge and redness.  Respiratory: Positive for cough and shortness of breath. Negative for hemoptysis, sputum production and wheezing.  Cardiovascular: Positive for palpitations, orthopnea and leg swelling. Negative for chest pain, claudication and PND.  Gastrointestinal: Negative for abdominal pain, blood in stool, heartburn, melena, nausea and vomiting.  Genitourinary: Negative for hematuria.  Musculoskeletal: Negative for falls and myalgias.  Skin: Negative for rash.  Neurological: Positive for weakness. Negative for dizziness, tingling, tremors, sensory change, speech change, focal weakness and loss of consciousness.  Endo/Heme/Allergies: Does not bruise/bleed easily.  Psychiatric/Behavioral: Negative for substance abuse. The patient is not nervous/anxious.   All other systems reviewed and are negative.     Physical Exam/Data:   Vitals:   06/13/17 0455 06/13/17 0831 06/13/17 0855 06/13/17 0951  BP:  (!) 155/127 (!) 149/125 (!) 136/104  Pulse:  (!) 126 (!) 127 (!) 124  Resp:  20 18   Temp:  (!) 97.4 F (36.3 C) 97.7 F (36.5 C)   TempSrc:  Oral Oral   SpO2:  100% 100%   Weight: 237 lb (107.5 kg)     Height:        Intake/Output Summary (Last 24 hours) at 06/13/2017 1001 Last data filed at 06/13/2017 0124 Gross per 24 hour  Intake -  Output 375 ml  Net -375 ml   Filed Weights   06/12/17 1833 06/13/17 0455  Weight: 210 lb (95.3  kg) 237 lb (107.5 kg)   Body mass index is 28.1 kg/m.   Physical Exam: General: Well developed, well nourished, in no acute distress. Head: Normocephalic, atraumatic, sclera non-icteric, no xanthomas, nares without discharge.  Neck: Negative for carotid bruits. JVD elevated ~ 10 cm. Lungs: Diminished breath sounds bilaterally with bibasilar crackles. Breathing is unlabored. Heart: Tachycardic, irregular with S1 S2. No murmurs, rubs, or gallops appreciated. Abdomen: Soft, non-tender, non-distended with normoactive bowel sounds. No hepatomegaly. No rebound/guarding. No obvious abdominal masses. Msk:  Strength and tone appear normal for age. Extremities: No clubbing or cyanosis. No edema. Distal pedal pulses are 2+ and equal bilaterally. Neuro: Alert and oriented X 3. No facial asymmetry. No focal deficit. Moves all extremities spontaneously. Psych:  Responds to questions appropriately with a normal affect.   EKG:  The EKG was personally reviewed and demonstrates: Atrial flutter with RVR, 175 bpm, nonspecific inferolateral st/t changes Telemetry:  Telemetry was personally reviewed and demonstrates: Atrial flutter/fib with RVR with heart rates ranging from the 170s to 120s bpm   Weights: Filed Weights   06/12/17 1833 06/13/17 0455  Weight: 210 lb (95.3 kg) 237 lb (107.5 kg)    Relevant CV Studies: TTE 05/21/17: Study Conclusions  - Left ventricle: The cavity size was mildly dilated. Systolic   function was severely reduced. The estimated ejection fraction   was <20% Diffuse hypokinesis. Regional wall motion abnormalities   cannot be excluded. The study is not technically sufficient to   allow evaluation of LV diastolic function. - Mitral valve: There was mild regurgitation. - Left atrium: The atrium was mildly dilated. - Right ventricle: Poorly visualized. Systolic function was normal. - Pulmonary arteries: Systolic pressure could not be accurately    estimated.  Impressions:  - Rhythm concerning for atrial flutter, rate 130 bpm s/p   cardioversion 05/01/2017, now back in atrial flutter.  Laboratory Data:  Chemistry Recent Labs  Lab 06/12/17 1839 06/13/17 0542  NA 141 140  K 3.4* 3.9  CL 102 103  CO2 22 20*  GLUCOSE 129* 124*  BUN 29* 34*  CREATININE 1.78* 1.83*  CALCIUM 9.3 9.1  GFRNONAA 38* 37*  GFRAA 44* 43*  ANIONGAP 17* 17*  No results for input(s): PROT, ALBUMIN, AST, ALT, ALKPHOS, BILITOT in the last 168 hours. Hematology Recent Labs  Lab 06/12/17 1839 06/13/17 0542  WBC 6.7 7.6  RBC 4.87 4.71  HGB 14.5 13.9  HCT 44.0 42.9  MCV 90.3 90.9  MCH 29.8 29.5  MCHC 33.0 32.4  RDW 15.5* 15.7*  PLT 310 307   Cardiac Enzymes Recent Labs  Lab 06/12/17 1839 06/12/17 2223  TROPONINI 0.04* 0.06*   No results for input(s): TROPIPOC in the last 168 hours.  BNP Recent Labs  Lab 06/12/17 1839  BNP 1,839.0*    DDimer No results for input(s): DDIMER in the last 168 hours.  Radiology/Studies:  Dg Chest 2 View  Result Date: 06/12/2017 IMPRESSION: Central bronchitis. No edema or consolidation. Mild cardiac enlargement. Electronically Signed   By: Lowella Grip III M.D.   On: 06/12/2017 19:05    Assessment and Plan:   1. Atrial flutter/fib with RVR: -Ventricular rates remain poorly controlled -Change Toprol XL to Lopressor 25 mg q 6 hours for now for added rate control -Not a good candidate for digoxin given his AKI -May need to use amiodarone infusion for rate control -Would try to avoid Cardizem given his cardiomyopathy -Heparin gtt, will need case manager assistance regarding affordable DOAC -If his ventricular rates remain difficult to control following diuresis he will require repeat TEE/DCCV -Will benefit from EP evaluation for Aflutter ablation   2. Acute on chronic combined CHF/pulmonary HTN: -Weight is up ~ 30-33 pounds from office visit on 2/22 -Renal function has initially worsened with  IV Lasix -Continue IV diuresis with close monitoring of SCr -Recent echo as above, no need to repeat at this time -Hold lisinopril given AKI -Will need to transition from Lopressor back to Toprol given his cardiomyopathy once his ventricular rates are better controlled -Not on spironolactone at this time given his AKI -Consider Entresto once his AKI is improved, will need a washout from ACEi -Needs R/LHC given his cardiomyopathy  3. Elevated troponin: -No chest pain -Likely supply demand ischemia in the setting of AKI, Aflutter with RVR and volume overload -Minimally elevated and flat trending -Heparin gtt as above  4. AKI: -Monitor with diuresis -Possible cardiorenal syndrome with low output -If renal function continues to worsen with diuresis he may require transfer to ICU with inotropic support to augment diuresis   5. HTN: -Escalate Lopressor as above  6. Hypokalemia: -Improved   7. Abnormal TSH: -Per IM   For questions or updates, please contact [place] Please consult [email or web address] for contact info under Cardiology/STEMI.   Signed, Christell Faith, PA-C [place] Pager: [phone] 06/13/2017, 10:01 AM

## 2017-06-13 NOTE — Progress Notes (Signed)
ANTICOAGULATION CONSULT NOTE - Initial Consult  Pharmacy Consult for heparin Indication: atrial fibrillation  No Known Allergies  Patient Measurements: Height: 6\' 5"  (195.6 cm) Weight: 237 lb (107.5 kg) IBW/kg (Calculated) : 89.1 Heparin Dosing Weight: 95.3 kg  Vital Signs: Temp: 97.6 F (36.4 C) (03/29 1432) Temp Source: Oral (03/29 1432) BP: 134/108 (03/29 1750) Pulse Rate: 125 (03/29 1750)  Labs: Recent Labs    06/12/17 1839 06/12/17 2002 06/12/17 2223 06/13/17 0542 06/13/17 1204 06/13/17 1813  HGB 14.5  --   --  13.9  --   --   HCT 44.0  --   --  42.9  --   --   PLT 310  --   --  307  --   --   APTT  --  28  --   --   --   --   LABPROT  --  15.9*  --   --   --   --   INR  --  1.28  --   --   --   --   HEPARINUNFRC  --   --   --  0.23* 0.43 0.41  CREATININE 1.78*  --   --  1.83*  --   --   TROPONINI 0.04*  --  0.06*  --   --   --     Estimated Creatinine Clearance: 54.2 mL/min (A) (by C-G formula based on SCr of 1.83 mg/dL (H)).   Medical History: Past Medical History:  Diagnosis Date  . Atrial flutter ([place])    a. s/p TEE/DCCV 04/2017; b. CHADS2VASc => 3 (CHF, HTN, age x 1); c. not compliant with Xarelto  . Chronic combined systolic and diastolic CHF (congestive heart failure) ([place])    a. TTE 2/19: EF 20-25%, diffuse HK, mod dilated LA, mildly reduced RVSF, PASP 42; b. TTE 3/19: EF 20-25%, diffuse HK, RVSF nl, atrial flutter with RVR  . Essential hypertension   . Pulmonary hypertension ([facility])     Medications:  Scheduled:  . atorvastatin  40 mg Oral q1800  . diltiazem  30 mg Oral Q8H  . furosemide  40 mg Intravenous Q12H  . metoprolol tartrate  25 mg Oral Q6H  . sodium chloride flush  3 mL Intravenous Q12H    Assessment: Patient admitted for SOB w/ h/o aflutter; is suppose to be anticoagulated w/ xarelto PTA; however, patient cannot afford medication and has not been taking it. Baseline labs confirm patient is not anticoagulated, will be started on  heparin drip in-house for anticoagulation for afib.  Goal of Therapy:  Heparin level 0.3-0.7 units/ml Monitor platelets by anticoagulation protocol: Yes   Plan:  Will bolus w/ heparin 4000 units IV x 1 Will start heparin drip @ 1150 units/hr  Will draw heparin level w/ am labs 6 hours post-start Baseline labs WNL Will monitor daily CBC's and adjust per anti-Xa's  03/29 @ 0600 HL 0.23 subtherapeutic. Will rebolus w/ heparin 1400 units IV x 1 and increase rate to 1300 units/hr and will recheck anti-Xa @ 1200, CBC stable.  3/29@1830  HL 0.41, was 0.43 at noon. Two therapeutic levels, will recheck with AM labs.    Thomasenia Sales, PharmD, MBA, [facility]    06/13/2017

## 2017-06-13 NOTE — Care Management Important Message (Signed)
Important Message  Patient Details  Name: Nathan [name]. MRN: [id number] Date of Birth: 08/12/1950   Medicare Important Message Given:  Yes  Signed IM notice given   Katrina Stack, RN 06/13/2017, 3:48 PM

## 2017-06-13 NOTE — Progress Notes (Signed)
ANTICOAGULATION CONSULT NOTE - Initial Consult  Pharmacy Consult for heparin Indication: atrial fibrillation  No Known Allergies  Patient Measurements: Height: 6\' 5"  (195.6 cm) Weight: 237 lb (107.5 kg) IBW/kg (Calculated) : 89.1 Heparin Dosing Weight: 95 kg  Vital Signs: Temp: 97.6 F (36.4 C) (03/29 1432) Temp Source: Oral (03/29 1432) BP: 131/94 (03/29 1433) Pulse Rate: 122 (03/29 1433)  Labs: Recent Labs    06/12/17 1839 06/12/17 2002 06/12/17 2223 06/13/17 0542 06/13/17 1204  HGB 14.5  --   --  13.9  --   HCT 44.0  --   --  42.9  --   PLT 310  --   --  307  --   APTT  --  28  --   --   --   LABPROT  --  15.9*  --   --   --   INR  --  1.28  --   --   --   HEPARINUNFRC  --   --   --  0.23* 0.43  CREATININE 1.78*  --   --  1.83*  --   TROPONINI 0.04*  --  0.06*  --   --     Estimated Creatinine Clearance: 54.2 mL/min (A) (by C-G formula based on SCr of 1.83 mg/dL (H)).   Medical History: Past Medical History:  Diagnosis Date  . Atrial flutter ([place])    a. s/p TEE/DCCV 04/2017; b. CHADS2VASc => 3 (CHF, HTN, age x 1); c. not compliant with Xarelto  . Chronic combined systolic and diastolic CHF (congestive heart failure) ([place])    a. TTE 2/19: EF 20-25%, diffuse HK, mod dilated LA, mildly reduced RVSF, PASP 42; b. TTE 3/19: EF 20-25%, diffuse HK, RVSF nl, atrial flutter with RVR  . Essential hypertension   . Pulmonary hypertension ([facility])     Medications:  Scheduled:  . atorvastatin  40 mg Oral q1800  . diltiazem  30 mg Oral Q8H  . furosemide  40 mg Intravenous Q12H  . metoprolol tartrate  25 mg Oral Q6H  . sodium chloride flush  3 mL Intravenous Q12H    Assessment: Patient admitted for SOB w/ h/o aflutter; is suppose to be anticoagulated w/ xarelto PTA; however, patient cannot afford medication and has not been taking it. Baseline labs confirm patient is not anticoagulated, will be started on heparin drip in-house for anticoagulation for afib.  Goal of  Therapy:  Heparin level 0.3-0.7 units/ml Monitor platelets by anticoagulation protocol: Yes   Plan:  HL = 0.43 is therapeutic. Continue heparin infusion at 1300 units/hr and order confirmatory HL in 6 hours. CBC daily  Lenis Noon, PharmD, BCPS Clinical Pharmacist 06/13/2017

## 2017-06-13 NOTE — Progress Notes (Signed)
Patient's HR and DBP remain elevated HR 126, BP 138/116. Christell Faith, PA notified. Per Thurmond Butts will restart cardizem PO. Will administer and continue to monitor.

## 2017-06-13 NOTE — Care Management (Signed)
It is documented that patient is not taking his Xarelto because he can not afford it.  Per his drug plan, patient has a yearly deductible of 95 dollars that must be met.  His monthly co pay for Xarelto  is 47 dollars.  He was quoted a price of 142 because he has not paid his deducible for this year. Discussed with patient that even if he switch to Eliquis it would be the same issue.  Patient says that he has been told he is going to be placed on coumadin at discharge.  CM stressed that even though this medication costs less, there are additional costs for medical and lab follow up. Also stressed the importance of strict compliance.  CM [provided patient with application to see if he would qualify for additional assistance with medications through [email or web address].

## 2017-06-14 ENCOUNTER — Other Ambulatory Visit: Payer: Self-pay

## 2017-06-14 DIAGNOSIS — I5043 Acute on chronic combined systolic (congestive) and diastolic (congestive) heart failure: Secondary | ICD-10-CM

## 2017-06-14 LAB — CBC
HEMATOCRIT: 39.4 % — AB (ref 40.0–52.0)
Hemoglobin: 13.3 g/dL (ref 13.0–18.0)
MCH: 29.8 pg (ref 26.0–34.0)
MCHC: 33.6 g/dL (ref 32.0–36.0)
MCV: 88.8 fL (ref 80.0–100.0)
Platelets: 257 10*3/uL (ref 150–440)
RBC: 4.44 MIL/uL (ref 4.40–5.90)
RDW: 15.5 % — ABNORMAL HIGH (ref 11.5–14.5)
WBC: 6.2 10*3/uL (ref 3.8–10.6)

## 2017-06-14 LAB — BASIC METABOLIC PANEL
Anion gap: 11 (ref 5–15)
BUN: 33 mg/dL — ABNORMAL HIGH (ref 6–20)
CHLORIDE: 106 mmol/L (ref 101–111)
CO2: 24 mmol/L (ref 22–32)
CREATININE: 1.54 mg/dL — AB (ref 0.61–1.24)
Calcium: 8.7 mg/dL — ABNORMAL LOW (ref 8.9–10.3)
GFR calc Af Amer: 53 mL/min — ABNORMAL LOW (ref >60)
GFR calc non Af Amer: 45 mL/min — ABNORMAL LOW (ref >60)
GLUCOSE: 92 mg/dL (ref 65–99)
POTASSIUM: 3 mmol/L — AB (ref 3.5–5.1)
SODIUM: 141 mmol/L (ref 135–145)

## 2017-06-14 LAB — HEPARIN LEVEL (UNFRACTIONATED)
HEPARIN UNFRACTIONATED: 0.45 [IU]/mL (ref 0.30–0.70)
Heparin Unfractionated: 0.21 IU/mL — ABNORMAL LOW (ref 0.30–0.70)
Heparin Unfractionated: 0.39 IU/mL (ref 0.30–0.70)

## 2017-06-14 MED ORDER — METOPROLOL TARTRATE 50 MG PO TABS: 75.0000 mg | ORAL_TABLET | Freq: Two times a day (BID) | ORAL | Status: DC

## 2017-06-14 MED ORDER — WARFARIN - PHARMACIST DOSING INPATIENT
Freq: Every day | Status: DC
  Administered 2017-06-14: 18:00:00

## 2017-06-14 MED ORDER — DILTIAZEM HCL 30 MG PO TABS
60.0000 mg | ORAL_TABLET | Freq: Two times a day (BID) | ORAL | Status: DC
  Administered 2017-06-14 – 2017-06-15 (×2): 60 mg via ORAL
  Filled 2017-06-14 (×2): qty 2

## 2017-06-14 MED ORDER — METOPROLOL TARTRATE 50 MG PO TABS
50.0000 mg | ORAL_TABLET | Freq: Four times a day (QID) | ORAL | Status: DC
Start: 2017-06-14 — End: 2017-06-15
  Administered 2017-06-14 – 2017-06-15 (×5): 50 mg via ORAL
  Filled 2017-06-14 (×4): qty 1

## 2017-06-14 MED ORDER — DILTIAZEM HCL 30 MG PO TABS: 60.0000 mg | ORAL_TABLET | Freq: Three times a day (TID) | ORAL | Status: DC

## 2017-06-14 MED ORDER — WARFARIN SODIUM 7.5 MG PO TABS
7.5000 mg | ORAL_TABLET | Freq: Once | ORAL | Status: AC
  Administered 2017-06-14: 7.5 mg via ORAL
  Filled 2017-06-14: qty 1

## 2017-06-14 MED ORDER — HEPARIN BOLUS VIA INFUSION
1400.0000 [IU] | Freq: Once | INTRAVENOUS | Status: AC
  Administered 2017-06-14: 1400 [IU] via INTRAVENOUS
  Filled 2017-06-14: qty 1400

## 2017-06-14 NOTE — Progress Notes (Signed)
ANTICOAGULATION CONSULT NOTE - Initial Consult  Pharmacy Consult for heparin and warfarin Indication: atrial fibrillation  No Known Allergies  Patient Measurements: Height: 6\' 5"  (195.6 cm) Weight: 229 lb 1.6 oz (103.9 kg) IBW/kg (Calculated) : 89.1 Heparin Dosing Weight: 95.3 kg  Vital Signs: Temp: 97.7 F (36.5 C) (03/30 0727) Temp Source: Oral (03/30 0727) BP: 111/93 (03/30 0727) Pulse Rate: 124 (03/30 0727)  Labs: Recent Labs    06/12/17 1839 06/12/17 2002 06/12/17 2223 06/13/17 0542  06/13/17 1813 06/14/17 0512 06/14/17 1102  HGB 14.5  --   --  13.9  --   --  13.3  --   HCT 44.0  --   --  42.9  --   --  39.4*  --   PLT 310  --   --  307  --   --  257  --   APTT  --  28  --   --   --   --   --   --   LABPROT  --  15.9*  --   --   --   --   --   --   INR  --  1.28  --   --   --   --   --   --   HEPARINUNFRC  --   --   --  0.23*   < > 0.41 0.21* 0.39  CREATININE 1.78*  --   --  1.83*  --   --  1.54*  --   TROPONINI 0.04*  --  0.06*  --   --   --   --   --    < > = values in this interval not displayed.    Estimated Creatinine Clearance: 59.5 mL/min (A) (by C-G formula based on SCr of 1.54 mg/dL (H)).   Medical History: Past Medical History:  Diagnosis Date  . Atrial flutter ([place])    a. s/p TEE/DCCV 04/2017; b. CHADS2VASc => 3 (CHF, HTN, age x 1); c. not compliant with Xarelto  . Chronic combined systolic and diastolic CHF (congestive heart failure) ([place])    a. TTE 2/19: EF 20-25%, diffuse HK, mod dilated LA, mildly reduced RVSF, PASP 42; b. TTE 3/19: EF 20-25%, diffuse HK, RVSF nl, atrial flutter with RVR  . Essential hypertension   . Pulmonary hypertension ([facility])     Medications:  Scheduled:  . atorvastatin  40 mg Oral q1800  . diltiazem  60 mg Oral Q8H  . furosemide  40 mg Intravenous Q12H  . metoprolol tartrate  25 mg Oral Q6H  . sodium chloride flush  3 mL Intravenous Q12H  . warfarin  7.5 mg Oral ONCE-1800  . Warfarin - Pharmacist Dosing Inpatient    Does not apply q1800    Assessment: Patient admitted for SOB w/ h/o aflutter; is suppose to be anticoagulated w/ xarelto PTA; however, patient cannot afford medication and has not been taking it. Baseline labs confirm patient is not anticoagulated, will be started on heparin drip in-house for anticoagulation for afib.  Goal of Therapy:  Heparin level 0.3-0.7 units/ml Monitor platelets by anticoagulation protocol: Yes   Plan:  Heparin level therapeutic at 0.39. Will recheck level in 6 hours for confirmation   Ramond Dial, Pharm.D, BCPS Clinical Pharmacist   06/14/2017

## 2017-06-14 NOTE — Progress Notes (Signed)
[name] at [facility]                                                                                                                                                                                  Patient Demographics   Nathan [name], is a 67 y.o. male, DOB - 1951-02-02, FYB:[id number]  Admit date - 06/12/2017   Admitting Physician Demetrios Loll, MD  Outpatient Primary MD for the patient is Patient, [name]   LOS - 2  Subjective: Is much better today, no shortness of breath however still has uncontrolled heart rate.  Weight is down to 129 pounds from 134 pounds on admission.  Review of Systems:   CONSTITUTIONAL: No documented fever. No fatigue, weakness. No weight gain, no weight loss.  EYES: No blurry or double vision.  ENT: No tinnitus. No postnasal drip. No redness of the oropharynx.  RESPIRATORY: No cough, no wheeze, no hemoptysis. No dyspnea.  CARDIOVASCULAR: No chest pain. Has orthopnea. Has palpitations. No syncope.  GASTROINTESTINAL: No nausea, no vomiting or diarrhea. No abdominal pain. No melena or hematochezia.  GENITOURINARY: No dysuria or hematuria.  ENDOCRINE: No polyuria or nocturia. No heat or cold intolerance.  HEMATOLOGY: No anemia. No bruising. No bleeding.  INTEGUMENTARY: No rashes. No lesions.  MUSCULOSKELETAL: No arthritis. No swelling. No gout.  NEUROLOGIC: No numbness, tingling, or ataxia. No seizure-type activity.  PSYCHIATRIC: No anxiety. No insomnia. No ADD.    Vitals:   Vitals:   06/13/17 1750 06/13/17 1953 06/14/17 0527 06/14/17 0727  BP: (!) 134/108 (!) 115/102 (!) 115/93 (!) 111/93  Pulse: (!) 125 (!) 126 (!) 124 (!) 124  Resp: 18 19 18 18   Temp:   98.2 F (36.8 C) 97.7 F (36.5 C)  TempSrc:   Oral Oral  SpO2: 100% 100% 98% 100%  Weight:   103.9 kg (229 lb 1.6 oz)   Height:        Wt Readings from Last 3 Encounters:  06/14/17 103.9 kg (229 lb 1.6 oz)  05/09/17 92.5 kg (204 lb)  05/01/17 106.6 kg (235 lb)      Intake/Output Summary (Last 24 hours) at 06/14/2017 1050 Last data filed at 06/14/2017 1006 Gross per 24 hour  Intake 772 ml  Output 1200 ml  Net -428 ml    Physical Exam:   GENERAL: Pleasant-appearing in no apparent distress.  HEAD, EYES, EARS, NOSE AND THROAT: Atraumatic, normocephalic. Extraocular muscles are intact. Pupils equal and reactive to light. Sclerae anicteric. No conjunctival injection. No oro-pharyngeal erythema.  NECK: Supple. There is no jugular venous distention. No bruits, no lymphadenopathy, no thyromegaly.  HEART: s1s2 irregular,. No murmurs, no rubs, no  clicks.  LUNGS: Bilateral decreased airflow, bibasilar crepitations heard. No wheezes.  ABDOMEN: Soft, flat, nontender, nondistended. Has good bowel sounds. No hepatosplenomegaly appreciated.  EXTREMITIES: No evidence of any cyanosis, clubbing, or peripheral edema.  +2 pedal and radial pulses bilaterally.  Has 1+ pedal edema NEUROLOGIC: The patient is alert, awake, and oriented x3 with no focal motor or sensory deficits appreciated bilaterally.  SKIN: Moist and warm with no rashes appreciated.  Psych: Not anxious, depressed LN: No inguinal LN enlargement    Antibiotics   Anti-infectives (From admission, onward)   None      Medications   Scheduled Meds: . atorvastatin  40 mg Oral q1800  . diltiazem  30 mg Oral Q8H  . furosemide  40 mg Intravenous Q12H  . metoprolol tartrate  25 mg Oral Q6H  . sodium chloride flush  3 mL Intravenous Q12H   Continuous Infusions: . sodium chloride    . heparin 1,500 Units/hr (06/14/17 0631)   PRN Meds:.sodium chloride, acetaminophen **OR** acetaminophen, albuterol, bisacodyl, HYDROcodone-acetaminophen, metoprolol tartrate, ondansetron **OR** ondansetron (ZOFRAN) IV, senna-docusate, sodium chloride flush   Data Review:   Micro Results No results found for this or any previous visit (from the past 240 hour(s)).  Radiology Reports Dg Chest 2 View  Result Date:  06/12/2017 CLINICAL DATA:  Shortness of breath and fatigue EXAM: CHEST - 2 VIEW COMPARISON:  April 29, 2017 FINDINGS: There is no appreciable edema or consolidation. There is central interstitial prominence. Heart is slightly enlarged with pulmonary vascularity within normal limits. No adenopathy. No bone lesions. IMPRESSION: Central bronchitis. No edema or consolidation. Mild cardiac enlargement. Electronically Signed   By: Lowella Grip III M.D.   On: 06/12/2017 19:05     CBC Recent Labs  Lab 06/12/17 1839 06/13/17 0542 06/14/17 0512  WBC 6.7 7.6 6.2  HGB 14.5 13.9 13.3  HCT 44.0 42.9 39.4*  PLT 310 307 257  MCV 90.3 90.9 88.8  MCH 29.8 29.5 29.8  MCHC 33.0 32.4 33.6  RDW 15.5* 15.7* 15.5*    Chemistries  Recent Labs  Lab 06/12/17 1839 06/12/17 2223 06/13/17 0542 06/14/17 0512  NA 141  --  140 141  K 3.4*  --  3.9 3.0*  CL 102  --  103 106  CO2 22  --  20* 24  GLUCOSE 129*  --  124* 92  BUN 29*  --  34* 33*  CREATININE 1.78*  --  1.83* 1.54*  CALCIUM 9.3  --  9.1 8.7*  MG  --  2.0  --   --    ------------------------------------------------------------------------------------------------------------------ estimated creatinine clearance is 59.5 mL/min (A) (by C-G formula based on SCr of 1.54 mg/dL (H)). ------------------------------------------------------------------------------------------------------------------ No results for input(s): HGBA1C in the last 72 hours. ------------------------------------------------------------------------------------------------------------------ No results for input(s): CHOL, HDL, LDLCALC, TRIG, CHOLHDL, LDLDIRECT in the last 72 hours. ------------------------------------------------------------------------------------------------------------------ No results for input(s): TSH, T4TOTAL, T3FREE, THYROIDAB in the last 72 hours.  Invalid input(s):  FREET3 ------------------------------------------------------------------------------------------------------------------ No results for input(s): VITAMINB12, FOLATE, FERRITIN, TIBC, IRON, RETICCTPCT in the last 72 hours.  Coagulation profile Recent Labs  Lab 06/12/17 2002  INR 1.28    No results for input(s): DDIMER in the last 72 hours.  Cardiac Enzymes Recent Labs  Lab 06/12/17 1839 06/12/17 2223  TROPONINI 0.04* 0.06*   ------------------------------------------------------------------------------------------------------------------ Invalid input(s): POCBNP    Assessment & Plan  67 year old male patient with history of atrial flutter currently under hospitalist service for shortness of breath and atrial fibrillation with rapid rate.  1.  Atrial fibrillation with rapid rate Patient is on p.o. Cardizem, metoprolol still heart rate is around 125 bpm, appreciate cardiology following, possibly will increase the dose of Cardizem today.  Patient on heparin drip admission, patient supposed to be on Xarelto at home but could not take it due to financial reasons, discussed about Coumadin, frequent checkup for INR also mentioned, patient wants to be on Coumadin.  Start the Coumadin today. EP studies recommended by cardiology in the past but patient was noncompliant.  2.  Acute respiratory distress with hypoxia Continue oxygen via nasal cannula patient not on oxygen anymore, clinically feeling much better.  No shortness of breath.  3.  Systolic heart failure Continue Lasix for diuresis Further recommendations by cardiology  is on IV Lasix, continue for another 24 hours.  Patient wants to go home tomorrow.  Electrolytes, kidney function are acceptable. 4.  Elevated troponin secondary to demand ischemia  5.  DVT prophylaxis Currently on anticoagulation with IV heparin drip Discharge tomorrow home patient wants to go home tomorrow.     Code Status Orders  (From admission, onward)         Start     Ordered   06/12/17 2143  Full code  Continuous     06/12/17 2142    Code Status History    Date Active Date Inactive Code Status Order ID Comments User Context   04/29/2017 2333 05/01/2017 1920 Full Code [id number]  Lance Coon, MD Inpatient      Time Spent in minutes   35  Greater than 50% of time spent in care coordination and counseling patient regarding the condition and plan of care.   Epifanio Lesches M.D on 06/14/2017 at 10:50 AM  Between 7am to 6pm - Pager - [phone]  After 6pm go to [email or web address] - Proofreader  [employer]   Office  [phone]

## 2017-06-14 NOTE — Progress Notes (Addendum)
ANTICOAGULATION CONSULT NOTE - Initial Consult  Pharmacy Consult for heparin and warfarin Indication: atrial fibrillation  No Known Allergies  Patient Measurements: Height: 6\' 5"  (195.6 cm) Weight: 237 lb (107.5 kg) IBW/kg (Calculated) : 89.1 Heparin Dosing Weight: 95.3 kg  Vital Signs: Temp: 98.2 F (36.8 C) (03/30 0527) Temp Source: Oral (03/30 0527) BP: 115/93 (03/30 0527) Pulse Rate: 124 (03/30 0527)  Labs: Recent Labs    06/12/17 1839 06/12/17 2002 06/12/17 2223  06/13/17 0542 06/13/17 1204 06/13/17 1813 06/14/17 0512  HGB 14.5  --   --   --  13.9  --   --  13.3  HCT 44.0  --   --   --  42.9  --   --  39.4*  PLT 310  --   --   --  307  --   --  257  APTT  --  28  --   --   --   --   --   --   LABPROT  --  15.9*  --   --   --   --   --   --   INR  --  1.28  --   --   --   --   --   --   HEPARINUNFRC  --   --   --    < > 0.23* 0.43 0.41 0.21*  CREATININE 1.78*  --   --   --  1.83*  --   --  1.54*  TROPONINI 0.04*  --  0.06*  --   --   --   --   --    < > = values in this interval not displayed.    Estimated Creatinine Clearance: 64.4 mL/min (A) (by C-G formula based on SCr of 1.54 mg/dL (H)).   Medical History: Past Medical History:  Diagnosis Date  . Atrial flutter ([place])    a. s/p TEE/DCCV 04/2017; b. CHADS2VASc => 3 (CHF, HTN, age x 1); c. not compliant with Xarelto  . Chronic combined systolic and diastolic CHF (congestive heart failure) ([place])    a. TTE 2/19: EF 20-25%, diffuse HK, mod dilated LA, mildly reduced RVSF, PASP 42; b. TTE 3/19: EF 20-25%, diffuse HK, RVSF nl, atrial flutter with RVR  . Essential hypertension   . Pulmonary hypertension ([facility])     Medications:  Scheduled:  . atorvastatin  40 mg Oral q1800  . diltiazem  30 mg Oral Q8H  . furosemide  40 mg Intravenous Q12H  . heparin  1,400 Units Intravenous Once  . metoprolol tartrate  25 mg Oral Q6H  . sodium chloride flush  3 mL Intravenous Q12H    Assessment: Patient admitted for SOB  w/ h/o aflutter; is suppose to be anticoagulated w/ xarelto PTA; however, patient cannot afford medication and has not been taking it. Baseline labs confirm patient is not anticoagulated, will be started on heparin drip in-house for anticoagulation for afib.  Goal of Therapy:  Heparin level 0.3-0.7 units/ml Monitor platelets by anticoagulation protocol: Yes   Plan:  Will bolus w/ heparin 4000 units IV x 1 Will start heparin drip @ 1150 units/hr  Will draw heparin level w/ am labs 6 hours post-start Baseline labs WNL Will monitor daily CBC's and adjust per anti-Xa's  03/29 @ 0600 HL 0.23 subtherapeutic. Will rebolus w/ heparin 1400 units IV x 1 and increase rate to 1300 units/hr and will recheck anti-Xa @ 1200, CBC stable.  3/29@1830  HL 0.41, was 0.43  at noon. Two therapeutic levels, will recheck with AM labs.   03/30 @ 0500 HL 0.21 subtherapeutic. Will rebolus w/ heparin 1400 units IV x 1 and increase rate to 1500 units/hr and will recheck @ 1100, CBC stable. 3/30@1100 ; Pharmacy consulted for warfarin dosing in afib. INR goal 2-3. Warfarin 7.5mg  x 1 dose tonight, will follow with tomorrow INR per protocol. INR 1.38 on 3/28     Donna Christen Arieana Somoza, PharmD, MBA, [facility]    06/14/2017

## 2017-06-14 NOTE — Progress Notes (Signed)
ANTICOAGULATION CONSULT NOTE - Initial Consult  Pharmacy Consult for heparin and warfarin Indication: atrial fibrillation  No Known Allergies  Patient Measurements: Height: 6\' 5"  (195.6 cm) Weight: 229 lb 1.6 oz (103.9 kg) IBW/kg (Calculated) : 89.1 Heparin Dosing Weight: 95.3 kg  Vital Signs: Temp: 98 F (36.7 C) (03/30 1947) Temp Source: Oral (03/30 1947) BP: 122/95 (03/30 1947) Pulse Rate: 126 (03/30 1947)  Labs: Recent Labs    06/12/17 1839 06/12/17 2002 06/12/17 2223 06/13/17 0542  06/14/17 0512 06/14/17 1102 06/14/17 1702  HGB 14.5  --   --  13.9  --  13.3  --   --   HCT 44.0  --   --  42.9  --  39.4*  --   --   PLT 310  --   --  307  --  257  --   --   APTT  --  28  --   --   --   --   --   --   LABPROT  --  15.9*  --   --   --   --   --   --   INR  --  1.28  --   --   --   --   --   --   HEPARINUNFRC  --   --   --  0.23*   < > 0.21* 0.39 0.45  CREATININE 1.78*  --   --  1.83*  --  1.54*  --   --   TROPONINI 0.04*  --  0.06*  --   --   --   --   --    < > = values in this interval not displayed.    Estimated Creatinine Clearance: 59.5 mL/min (A) (by C-G formula based on SCr of 1.54 mg/dL (H)).   Medical History: Past Medical History:  Diagnosis Date  . Atrial flutter ([place])    a. s/p TEE/DCCV 04/2017; b. CHADS2VASc => 3 (CHF, HTN, age x 1); c. not compliant with Xarelto  . Chronic combined systolic and diastolic CHF (congestive heart failure) ([place])    a. TTE 2/19: EF 20-25%, diffuse HK, mod dilated LA, mildly reduced RVSF, PASP 42; b. TTE 3/19: EF 20-25%, diffuse HK, RVSF nl, atrial flutter with RVR  . Essential hypertension   . Pulmonary hypertension (HCC)     Medications:  Scheduled:  . atorvastatin  40 mg Oral q1800  . diltiazem  60 mg Oral Q12H  . furosemide  40 mg Intravenous Q12H  . metoprolol tartrate  50 mg Oral QID  . sodium chloride flush  3 mL Intravenous Q12H  . Warfarin - Pharmacist Dosing Inpatient   Does not apply q1800     Assessment: Patient admitted for SOB w/ h/o aflutter; is suppose to be anticoagulated w/ xarelto PTA; however, patient cannot afford medication and has not been taking it. Baseline labs confirm patient is not anticoagulated, will be started on heparin drip in-house for anticoagulation for afib.  Goal of Therapy:  Heparin level 0.3-0.7 units/ml Monitor platelets by anticoagulation protocol: Yes   Plan:  Heparin level therapeutic at 0.39. Will recheck level in 6 hours for confirmation 3/30@2030  HL 0.45, continue current rate will recheck with AM labs per protocol.    Thomasenia Sales, PharmD, MBA, [facility]    06/14/2017

## 2017-06-14 NOTE — Progress Notes (Addendum)
Progress Note  Patient Name: Nathan [name]. Date of Encounter: 06/14/2017  Primary Cardiologist: No primary care provider on file.   Subjective   Patient says his breathing is better   Denies palpitations   No CP     Says he has to be home by Monday   Can come back  Inpatient Medications    Scheduled Meds: . atorvastatin  40 mg Oral q1800  . diltiazem  30 mg Oral Q8H  . furosemide  40 mg Intravenous Q12H  . metoprolol tartrate  25 mg Oral Q6H  . sodium chloride flush  3 mL Intravenous Q12H   Continuous Infusions: . sodium chloride    . heparin 1,500 Units/hr (06/14/17 0631)   PRN Meds: sodium chloride, acetaminophen **OR** acetaminophen, albuterol, bisacodyl, HYDROcodone-acetaminophen, metoprolol tartrate, ondansetron **OR** ondansetron (ZOFRAN) IV, senna-docusate, sodium chloride flush   Vital Signs    Vitals:   06/13/17 1750 06/13/17 1953 06/14/17 0527 06/14/17 0727  BP: (!) 134/108 (!) 115/102 (!) 115/93 (!) 111/93  Pulse: (!) 125 (!) 126 (!) 124 (!) 124  Resp: 18 19 18 18   Temp:   98.2 F (36.8 C) 97.7 F (36.5 C)  TempSrc:   Oral Oral  SpO2: 100% 100% 98% 100%  Weight:   229 lb 1.6 oz (103.9 kg)   Height:        Intake/Output Summary (Last 24 hours) at 06/14/2017 1011 Last data filed at 06/14/2017 1006 Gross per 24 hour  Intake 772 ml  Output 1200 ml  Net -428 ml   Filed Weights   06/12/17 1833 06/13/17 0455 06/14/17 0527  Weight: 210 lb (95.3 kg) 237 lb (107.5 kg) 229 lb 1.6 oz (103.9 kg)    Telemetry    Atrial flutter with RVR  Average HR 120- Personally Reviewed  ECG    New one pending     Physical Exam   GEN: No acute distress.   Neck: No JVD Cardiac:Tachy  S1 S2   no murmurs, rubs, or gallops.  Respiratory: Mild rhonchi   Rales at bases   GI: Soft, nontender, non-distended  MS:  Triv  edema; No deformity. Neuro:  Nonfocal  Psych: Normal affect   Labs    Chemistry Recent Labs  Lab 06/12/17 1839 06/13/17 0542 06/14/17 0512    NA 141 140 141  K 3.4* 3.9 3.0*  CL 102 103 106  CO2 22 20* 24  GLUCOSE 129* 124* 92  BUN 29* 34* 33*  CREATININE 1.78* 1.83* 1.54*  CALCIUM 9.3 9.1 8.7*  GFRNONAA 38* 37* 45*  GFRAA 44* 43* 53*  ANIONGAP 17* 17* 11     Hematology Recent Labs  Lab 06/12/17 1839 06/13/17 0542 06/14/17 0512  WBC 6.7 7.6 6.2  RBC 4.87 4.71 4.44  HGB 14.5 13.9 13.3  HCT 44.0 42.9 39.4*  MCV 90.3 90.9 88.8  MCH 29.8 29.5 29.8  MCHC 33.0 32.4 33.6  RDW 15.5* 15.7* 15.5*  PLT 310 307 257    Cardiac Enzymes Recent Labs  Lab 06/12/17 1839 06/12/17 2223  TROPONINI 0.04* 0.06*   No results for input(s): TROPIPOC in the last 168 hours.   BNP Recent Labs  Lab 06/12/17 1839  BNP 1,839.0*     DDimer No results for input(s): DDIMER in the last 168 hours.   Radiology    Dg Chest 2 View  Result Date: 06/12/2017 CLINICAL DATA:  Shortness of breath and fatigue EXAM: CHEST - 2 VIEW COMPARISON:  April 29, 2017 FINDINGS: There  is no appreciable edema or consolidation. There is central interstitial prominence. Heart is slightly enlarged with pulmonary vascularity within normal limits. No adenopathy. No bone lesions. IMPRESSION: Central bronchitis. No edema or consolidation. Mild cardiac enlargement. Electronically Signed   By: Lowella Grip III M.D.   On: 06/12/2017 19:05    Cardiac Studies   EKG   Atrail flutter 2:1 AV conduction   LVH     Patient Profile     67 y.o. male  Hx of atrial flutter, HTN, CHF   Echo showed LVEF 20 to 25%    Cardioversion in Feb 2019  Hx of noncompliance with anticoagulation and no show for EP appt Presentwed with SOB and palpitations   Found to be in afib with RVR with CHF     Assessment & Plan    1  Atrail flutter   Rates are not controlled    Average is 120    I would up titrate b blocker   Try to use instead of dilt given severe LV dysfunction Note that he is getting coumadin and heparin    I have reviewed echo  Atria are large, esp right atrium    I  will review all of above with EP     2  Acute on chronic systolic / diastolic CHF  LVEF in Feb <20%  Probably tachycardic mediated CM   I have discussed this with the pt    He wants to leave     He failed electrical cardioversion earlier this year and then did not show up for EP evaluation.  Until rhythm/rate is controlled he will continue to have problems wih heart failure   Will revious with EP.     Keep on diuresis.  3  Elevated tropinin    Minimal  Prob due to rapid rates in setting of CHF    4  Thyroid  Pt's TSH was low in Feb   Check panel  May be exacerbating rhythm    For questions or updates, please contact [place] Please consult [email or web address] for contact info under Cardiology/STEMI.      Signed, Dorris Carnes, MD  06/14/2017, 10:11 AM

## 2017-06-15 ENCOUNTER — Inpatient Hospital Stay (HOSPITAL_COMMUNITY)
Admission: AD | Admit: 2017-06-15 | Discharge: 2017-06-17 | DRG: 273 | Disposition: A | Discharge status: Home / Self Care | Payer: Medicare HMO | Source: Ambulatory Visit | Attending: Internal Medicine | Admitting: Internal Medicine

## 2017-06-15 ENCOUNTER — Other Ambulatory Visit: Payer: Self-pay

## 2017-06-15 ENCOUNTER — Encounter (HOSPITAL_COMMUNITY): Payer: Self-pay | Admitting: Nurse Practitioner

## 2017-06-15 DIAGNOSIS — I483 Typical atrial flutter: Secondary | ICD-10-CM | POA: Diagnosis not present

## 2017-06-15 DIAGNOSIS — I5021 Acute systolic (congestive) heart failure: Secondary | ICD-10-CM | POA: Diagnosis not present

## 2017-06-15 DIAGNOSIS — R778 Other specified abnormalities of plasma proteins: Secondary | ICD-10-CM | POA: Diagnosis present

## 2017-06-15 DIAGNOSIS — I4892 Unspecified atrial flutter: Secondary | ICD-10-CM | POA: Diagnosis present

## 2017-06-15 DIAGNOSIS — I081 Rheumatic disorders of both mitral and tricuspid valves: Secondary | ICD-10-CM | POA: Diagnosis not present

## 2017-06-15 DIAGNOSIS — I272 Pulmonary hypertension, unspecified: Secondary | ICD-10-CM | POA: Diagnosis present

## 2017-06-15 DIAGNOSIS — E876 Hypokalemia: Secondary | ICD-10-CM | POA: Diagnosis not present

## 2017-06-15 DIAGNOSIS — I5023 Acute on chronic systolic (congestive) heart failure: Secondary | ICD-10-CM | POA: Diagnosis not present

## 2017-06-15 DIAGNOSIS — Z9119 Patient's noncompliance with other medical treatment and regimen: Secondary | ICD-10-CM

## 2017-06-15 DIAGNOSIS — Z87891 Personal history of nicotine dependence: Secondary | ICD-10-CM

## 2017-06-15 DIAGNOSIS — T502X5A Adverse effect of carbonic-anhydrase inhibitors, benzothiadiazides and other diuretics, initial encounter: Secondary | ICD-10-CM | POA: Diagnosis not present

## 2017-06-15 DIAGNOSIS — N179 Acute kidney failure, unspecified: Secondary | ICD-10-CM | POA: Diagnosis not present

## 2017-06-15 DIAGNOSIS — I4891 Unspecified atrial fibrillation: Secondary | ICD-10-CM | POA: Diagnosis present

## 2017-06-15 DIAGNOSIS — I248 Other forms of acute ischemic heart disease: Secondary | ICD-10-CM | POA: Diagnosis not present

## 2017-06-15 DIAGNOSIS — Z9114 Patient's other noncompliance with medication regimen: Secondary | ICD-10-CM | POA: Diagnosis not present

## 2017-06-15 DIAGNOSIS — R748 Abnormal levels of other serum enzymes: Secondary | ICD-10-CM | POA: Diagnosis not present

## 2017-06-15 DIAGNOSIS — I110 Hypertensive heart disease with heart failure: Secondary | ICD-10-CM | POA: Diagnosis present

## 2017-06-15 DIAGNOSIS — J9601 Acute respiratory failure with hypoxia: Secondary | ICD-10-CM | POA: Diagnosis not present

## 2017-06-15 DIAGNOSIS — I5043 Acute on chronic combined systolic (congestive) and diastolic (congestive) heart failure: Secondary | ICD-10-CM

## 2017-06-15 DIAGNOSIS — I361 Nonrheumatic tricuspid (valve) insufficiency: Secondary | ICD-10-CM | POA: Diagnosis not present

## 2017-06-15 DIAGNOSIS — R7989 Other specified abnormal findings of blood chemistry: Secondary | ICD-10-CM | POA: Diagnosis present

## 2017-06-15 LAB — PROTIME-INR
INR: 1.16
Prothrombin Time: 14.7 seconds (ref 11.4–15.2)

## 2017-06-15 LAB — BASIC METABOLIC PANEL
ANION GAP: 12 (ref 5–15)
BUN: 31 mg/dL — ABNORMAL HIGH (ref 6–20)
CALCIUM: 8.7 mg/dL — AB (ref 8.9–10.3)
CO2: 28 mmol/L (ref 22–32)
Chloride: 101 mmol/L (ref 101–111)
Creatinine, Ser: 1.44 mg/dL — ABNORMAL HIGH (ref 0.61–1.24)
GFR, EST AFRICAN AMERICAN: 57 mL/min — AB (ref >60)
GFR, EST NON AFRICAN AMERICAN: 49 mL/min — AB (ref >60)
GLUCOSE: 100 mg/dL — AB (ref 65–99)
POTASSIUM: 2.9 mmol/L — AB (ref 3.5–5.1)
Sodium: 141 mmol/L (ref 135–145)

## 2017-06-15 LAB — T4, FREE: FREE T4: 1.13 ng/dL — AB (ref 0.61–1.12)

## 2017-06-15 LAB — MAGNESIUM: MAGNESIUM: 1.6 mg/dL — AB (ref 1.7–2.4)

## 2017-06-15 LAB — TSH: TSH: 4.001 u[IU]/mL (ref 0.350–4.500)

## 2017-06-15 LAB — HEPARIN LEVEL (UNFRACTIONATED): HEPARIN UNFRACTIONATED: 0.47 [IU]/mL (ref 0.30–0.70)

## 2017-06-15 MED ORDER — POTASSIUM CHLORIDE CRYS ER 20 MEQ PO TBCR: 40.0000 meq | EXTENDED_RELEASE_TABLET | Freq: Three times a day (TID) | ORAL | Status: DC

## 2017-06-15 MED ORDER — WARFARIN SODIUM 7.5 MG PO TABS
7.5000 mg | ORAL_TABLET | Freq: Once | ORAL | Status: DC
  Filled 2017-06-15: qty 1

## 2017-06-15 MED ORDER — POTASSIUM CHLORIDE CRYS ER 20 MEQ PO TBCR
40.0000 meq | EXTENDED_RELEASE_TABLET | Freq: Once | ORAL | Status: AC
  Administered 2017-06-15: 40 meq via ORAL
  Filled 2017-06-15: qty 2

## 2017-06-15 MED ORDER — METOPROLOL SUCCINATE ER 50 MG PO TB24
50.0000 mg | ORAL_TABLET | Freq: Every day | ORAL | Status: DC
  Administered 2017-06-15 – 2017-06-17 (×3): 50 mg via ORAL
  Filled 2017-06-15 (×3): qty 1

## 2017-06-15 MED ORDER — POTASSIUM CHLORIDE CRYS ER 20 MEQ PO TBCR
40.0000 meq | EXTENDED_RELEASE_TABLET | Freq: Two times a day (BID) | ORAL | Status: DC
  Administered 2017-06-15: 40 meq via ORAL
  Filled 2017-06-15: qty 2

## 2017-06-15 MED ORDER — HEPARIN (PORCINE) IN NACL 100-0.45 UNIT/ML-% IJ SOLN
1500.0000 [IU]/h | INTRAMUSCULAR | Status: DC
  Administered 2017-06-15 – 2017-06-16 (×2): 1500 [IU]/h via INTRAVENOUS
  Filled 2017-06-15 (×2): qty 250

## 2017-06-15 MED ORDER — PREMIER PROTEIN SHAKE
11.0000 [oz_av] | Freq: Two times a day (BID) | ORAL | Status: DC
  Administered 2017-06-15: 11 [oz_av] via ORAL

## 2017-06-15 MED ORDER — POTASSIUM CHLORIDE CRYS ER 20 MEQ PO TBCR
20.0000 meq | EXTENDED_RELEASE_TABLET | Freq: Three times a day (TID) | ORAL | Status: DC
  Administered 2017-06-15 – 2017-06-17 (×5): 20 meq via ORAL
  Filled 2017-06-15 (×5): qty 1

## 2017-06-15 MED ORDER — POTASSIUM CHLORIDE 10 MEQ/100ML IV SOLN
10.0000 meq | INTRAVENOUS | Status: DC
  Administered 2017-06-15: 10 meq via INTRAVENOUS
  Filled 2017-06-15 (×4): qty 100

## 2017-06-15 MED ORDER — ONDANSETRON HCL 4 MG/2ML IJ SOLN: 4.0000 mg | Freq: Four times a day (QID) | INTRAMUSCULAR | Status: DC | PRN

## 2017-06-15 MED ORDER — FUROSEMIDE 10 MG/ML IJ SOLN
40.0000 mg | Freq: Two times a day (BID) | INTRAMUSCULAR | Status: DC
  Administered 2017-06-15 – 2017-06-16 (×3): 40 mg via INTRAVENOUS
  Filled 2017-06-15 (×4): qty 4

## 2017-06-15 MED ORDER — ATORVASTATIN CALCIUM 40 MG PO TABS
40.0000 mg | ORAL_TABLET | Freq: Every day | ORAL | Status: DC
  Administered 2017-06-15: 40 mg via ORAL
  Filled 2017-06-15: qty 1

## 2017-06-15 MED ORDER — ACETAMINOPHEN 325 MG PO TABS: 650.0000 mg | ORAL_TABLET | ORAL | Status: DC | PRN

## 2017-06-15 NOTE — Discharge Instructions (Signed)
Shortness of Breath, Adult Shortness of breath is when a person has trouble breathing enough air, or when a person feels like she or he is having trouble breathing in enough air. Shortness of breath could be a sign of medical problem. Follow these instructions at home: Pay attention to any changes in your symptoms. Take these actions to help with your condition:  Do not smoke. Smoking is a common cause of shortness of breath. If you smoke and you need help quitting, ask your health care provider.  Avoid things that can irritate your airways, such as: ? Mold. ? Dust. ? Air pollution. ? Chemical fumes. ? Things that can cause allergy symptoms (allergens), if you have allergies.  Keep your living space clean and free of mold and dust.  Rest as needed. Slowly return to your usual activities.  Take over-the-counter and prescription medicines, including oxygen and inhaled medicines, only as told by your health care provider.  Keep all follow-up visits as told by your health care provider. This is important.  Contact a health care provider if:  Your condition does not improve as soon as expected.  You have a hard time doing your normal activities, even after you rest.  You have new symptoms. Get help right away if:  Your shortness of breath gets worse.  You have shortness of breath when you are resting.  You feel light-headed or you faint.  You have a cough that is not controlled with medicines.  You cough up blood.  You have pain with breathing.  You have pain in your chest, arms, shoulders, or abdomen.  You have a fever.  You cannot walk up stairs or exercise the way that you normally do. This information is not intended to replace advice given to you by your health care provider. Make sure you discuss any questions you have with your health care provider. Document Released: 11/27/2000 Document Revised: 09/23/2015 Document Reviewed: 08/10/2015 Elsevier Interactive Patient  Education  2018 [employer]  

## 2017-06-15 NOTE — Progress Notes (Signed)
   Pt arrived from [facility] to room, Nathan [name]. He is getting settled into his room. Telemetry shows atrial flutter in the 90's. BP 11/80. Pt is still feeling "a little woozy", no chest discomfort or dyspnea. Admission orders placed and will be NPO after midnight for possible intervention tomorrow. IV heparin is infusing.   Daune Perch, AGNP-C [facility] 06/15/2017  4:44 PM Pager: [phone]

## 2017-06-15 NOTE — Progress Notes (Signed)
Progress Note  Patient Name: Nathan [name]. Date of Encounter: 06/15/2017  Primary Cardiologist: No primary care provider on file.   Subjective   No CP   Breathing is OK at rest  Inpatient Medications    Scheduled Meds: . atorvastatin  40 mg Oral q1800  . diltiazem  60 mg Oral Q12H  . furosemide  40 mg Intravenous Q12H  . metoprolol tartrate  50 mg Oral QID  . sodium chloride flush  3 mL Intravenous Q12H  . Warfarin - Pharmacist Dosing Inpatient   Does not apply q1800   Continuous Infusions: . sodium chloride    . heparin 1,500 Units/hr (06/14/17 0631)   PRN Meds:  Vital Signs    Vitals:   06/14/17 1947 06/15/17 0500 06/15/17 0640 06/15/17 0709  BP: (!) 122/95  (!) 120/98 (!) 131/102  Pulse: (!) 126  (!) 124 (!) 122  Resp: 18  17 18   Temp: 98 F (36.7 C)  97.6 F (36.4 C) 98.1 F (36.7 C)  TempSrc: Oral  Oral   SpO2: 97%  100% 96%  Weight: 227 lb 9.6 oz (103.2 kg) 227 lb 11.2 oz (103.3 kg)    Height:        Intake/Output Summary (Last 24 hours) at 06/15/2017 0906 Last data filed at 06/15/2017 0700 Gross per 24 hour  Intake 1340.97 ml  Output 2475 ml  Net -1134.03 ml   Filed Weights   06/14/17 0527 06/14/17 1947 06/15/17 0500  Weight: 229 lb 1.6 oz (103.9 kg) 227 lb 9.6 oz (103.2 kg) 227 lb 11.2 oz (103.3 kg)    Telemetry    Atrial flutter with RVR  Average HR 120- Personally Reviewed  ECG    New one pending     Physical Exam   GEN: No acute distress.   Neck:  JVP increased Cardiac:Tachy  S1 S2   no murmurs, rubs, or gallops.  Respiratory: Mild rhonchi   Rales at bases   GI: Soft, nontender, non-distended  MS:  Triv  edema; No deformity. Neuro:  Nonfocal  Psych: Normal affect   Labs    Chemistry Recent Labs  Lab 06/13/17 0542 06/14/17 0512 06/15/17 0511  NA 140 141 141  K 3.9 3.0* 2.9*  CL 103 106 101  CO2 20* 24 28  GLUCOSE 124* 92 100*  BUN 34* 33* 31*  CREATININE 1.83* 1.54* 1.44*  CALCIUM 9.1 8.7* 8.7*  GFRNONAA 37* 45*  49*  GFRAA 43* 53* 57*  ANIONGAP 17* 11 12     Hematology Recent Labs  Lab 06/12/17 1839 06/13/17 0542 06/14/17 0512  WBC 6.7 7.6 6.2  RBC 4.87 4.71 4.44  HGB 14.5 13.9 13.3  HCT 44.0 42.9 39.4*  MCV 90.3 90.9 88.8  MCH 29.8 29.5 29.8  MCHC 33.0 32.4 33.6  RDW 15.5* 15.7* 15.5*  PLT 310 307 257    Cardiac Enzymes Recent Labs  Lab 06/12/17 1839 06/12/17 2223  TROPONINI 0.04* 0.06*   No results for input(s): TROPIPOC in the last 168 hours.   BNP Recent Labs  Lab 06/12/17 1839  BNP 1,839.0*     DDimer No results for input(s): DDIMER in the last 168 hours.   Radiology    No results found.  Cardiac Studies   EKG   Atrail flutter 2:1 AV conduction   LVH     Patient Profile     67 y.o. male  Hx of atrial flutter, HTN, CHF   Echo showed LVEF 20 to 25%  Cardioversion in Feb 2019  Hx of noncompliance with anticoagulation and no show for EP appt Presentwed with SOB and palpitations   Found to be in afib with RVR with CHF     Assessment & Plan    1  Atrail flutter   I have reviewed EKGs with J Allred and G Taylor   Would recomm tx to [facility] for ablation on Monday as medical Rx has been unsuccessful Will plan for tx today   In AM will plan on TEE and the if neg EP study    Discussed with pt who agrees with plan    Keep on b blocker  Cut back on dilt   I don think rate affected much Keep on heparin   Hold coumadin.   I would be good for him to take NOAC if compliant to avoid up/down in ranges    2  Acute on chronic systolic / diastolic CHF  LVEF in Feb <20%  Probably tachycardic mediated CM    Pt continues to diurese some on IV lasix   Volume is not bad  Will need to replete KCL  Increase to q 8 hours    3  Elevated tropinin    Minimal  Prob due to rapid rates in setting of CHF    4  Thyroid    Check TSH, free T4, Free T3 today   For questions or updates, please contact [facility] Please consult [email or web address] for contact info under Cardiology/STEMI.       Signed, Dorris Carnes, MD  06/15/2017, 9:06 AM

## 2017-06-15 NOTE — Progress Notes (Signed)
[place] at [facility]                                                                                                                                                                                  Patient Demographics   Nathan [name], is a 67 y.o. male, DOB - [birth year], RSW:[id number]  Admit date - 06/12/2017   Admitting Physician Demetrios Loll, MD  Outpatient Primary MD for the patient is Patient, [name]   LOS - 3  Subjective: Patient is feeling better however has hypokalemia, getting burning with IV potassium so giving oral potassium supplements.  Tachycardic with heart rate up to 122 bpm.  Appreciate cardiology following the patient.  BP also high this morning around 131/102.  Review of Systems:   CONSTITUTIONAL: No documented fever. No fatigue, weakness. No weight gain, no weight loss.  EYES: No blurry or double vision.  ENT: No tinnitus. No postnasal drip. No redness of the oropharynx.  RESPIRATORY: No cough, no wheeze, no hemoptysis. No dyspnea.  CARDIOVASCULAR: No chest pain. Has orthopnea. Has palpitations. No syncope.  GASTROINTESTINAL: No nausea, no vomiting or diarrhea. No abdominal pain. No melena or hematochezia.  GENITOURINARY: No dysuria or hematuria.  ENDOCRINE: No polyuria or nocturia. No heat or cold intolerance.  HEMATOLOGY: No anemia. No bruising. No bleeding.  INTEGUMENTARY: No rashes. No lesions.  MUSCULOSKELETAL: No arthritis. No swelling. No gout.  NEUROLOGIC: No numbness, tingling, or ataxia. No seizure-type activity.  PSYCHIATRIC: No anxiety. No insomnia. No ADD.    Vitals:   Vitals:   06/14/17 1947 06/15/17 0500 06/15/17 0640 06/15/17 0709  BP: (!) 122/95  (!) 120/98 (!) 131/102  Pulse: (!) 126  (!) 124 (!) 122  Resp: 18  17 18   Temp: 98 F (36.7 C)  97.6 F (36.4 C) 98.1 F (36.7 C)  TempSrc: Oral  Oral   SpO2: 97%  100% 96%  Weight: 103.2 kg (227 lb 9.6 oz) 103.3 kg (227 lb 11.2 oz)    Height:        Wt  Readings from Last 3 Encounters:  06/15/17 103.3 kg (227 lb 11.2 oz)  05/09/17 92.5 kg (204 lb)  05/01/17 106.6 kg (235 lb)     Intake/Output Summary (Last 24 hours) at 06/15/2017 1115 Last data filed at 06/15/2017 0956 Gross per 24 hour  Intake 1340.97 ml  Output 1975 ml  Net -634.03 ml    Physical Exam:   GENERAL: Pleasant-appearing in no apparent distress.  HEAD, EYES, EARS, NOSE AND THROAT: Atraumatic, normocephalic. Extraocular muscles are intact. Pupils equal and reactive to light. Sclerae anicteric. No conjunctival injection. No oro-pharyngeal erythema.  NECK: Supple. There  is no jugular venous distention. No bruits, no lymphadenopathy, no thyromegaly.  HEART: s1s2 irregular,. No murmurs, no rubs, no clicks.  LUNGS: Bilateral decreased airflow, bibasilar crepitations heard. No wheezes.  ABDOMEN: Soft, flat, nontender, nondistended. Has good bowel sounds. No hepatosplenomegaly appreciated.  EXTREMITIES: No evidence of any cyanosis, clubbing, or peripheral edema.  +2 pedal and radial pulses bilaterally.  Has 1+ pedal edema NEUROLOGIC: The patient is alert, awake, and oriented x3 with no focal motor or sensory deficits appreciated bilaterally.  SKIN: Moist and warm with no rashes appreciated.  Psych: Not anxious, depressed LN: No inguinal LN enlargement    Antibiotics   Anti-infectives (From admission, onward)   None      Medications   Scheduled Meds: . atorvastatin  40 mg Oral q1800  . diltiazem  60 mg Oral Q12H  . furosemide  40 mg Intravenous Q12H  . metoprolol tartrate  50 mg Oral QID  . potassium chloride  40 mEq Oral BID  . sodium chloride flush  3 mL Intravenous Q12H  . warfarin  7.5 mg Oral ONCE-1800  . Warfarin - Pharmacist Dosing Inpatient   Does not apply q1800   Continuous Infusions: . sodium chloride    . heparin 1,500 Units/hr (06/14/17 0631)   PRN Meds:.sodium chloride, acetaminophen **OR** acetaminophen, albuterol, bisacodyl,  HYDROcodone-acetaminophen, metoprolol tartrate, ondansetron **OR** ondansetron (ZOFRAN) IV, senna-docusate, sodium chloride flush   Data Review:   Micro Results No results found for this or any previous visit (from the past 240 hour(s)).  Radiology Reports Dg Chest 2 View  Result Date: 06/12/2017 CLINICAL DATA:  Shortness of breath and fatigue EXAM: CHEST - 2 VIEW COMPARISON:  April 29, 2017 FINDINGS: There is no appreciable edema or consolidation. There is central interstitial prominence. Heart is slightly enlarged with pulmonary vascularity within normal limits. No adenopathy. No bone lesions. IMPRESSION: Central bronchitis. No edema or consolidation. Mild cardiac enlargement. Electronically Signed   By: Lowella Grip III M.D.   On: 06/12/2017 19:05     CBC Recent Labs  Lab 06/12/17 1839 06/13/17 0542 06/14/17 0512  WBC 6.7 7.6 6.2  HGB 14.5 13.9 13.3  HCT 44.0 42.9 39.4*  PLT 310 307 257  MCV 90.3 90.9 88.8  MCH 29.8 29.5 29.8  MCHC 33.0 32.4 33.6  RDW 15.5* 15.7* 15.5*    Chemistries  Recent Labs  Lab 06/12/17 1839 06/12/17 2223 06/13/17 0542 06/14/17 0512 06/15/17 0511  NA 141  --  140 141 141  K 3.4*  --  3.9 3.0* 2.9*  CL 102  --  103 106 101  CO2 22  --  20* 24 28  GLUCOSE 129*  --  124* 92 100*  BUN 29*  --  34* 33* 31*  CREATININE 1.78*  --  1.83* 1.54* 1.44*  CALCIUM 9.3  --  9.1 8.7* 8.7*  MG  --  2.0  --   --   --    ------------------------------------------------------------------------------------------------------------------ estimated creatinine clearance is 63.6 mL/min (A) (by C-G formula based on SCr of 1.44 mg/dL (H)). ------------------------------------------------------------------------------------------------------------------ No results for input(s): HGBA1C in the last 72 hours. ------------------------------------------------------------------------------------------------------------------ No results for input(s): CHOL, HDL,  LDLCALC, TRIG, CHOLHDL, LDLDIRECT in the last 72 hours. ------------------------------------------------------------------------------------------------------------------ No results for input(s): TSH, T4TOTAL, T3FREE, THYROIDAB in the last 72 hours.  Invalid input(s): FREET3 ------------------------------------------------------------------------------------------------------------------ No results for input(s): VITAMINB12, FOLATE, FERRITIN, TIBC, IRON, RETICCTPCT in the last 72 hours.  Coagulation profile Recent Labs  Lab 06/12/17 2002 06/15/17 0511  INR 1.28 1.16  No results for input(s): DDIMER in the last 72 hours.  Cardiac Enzymes Recent Labs  Lab 06/12/17 1839 06/12/17 2223  TROPONINI 0.04* 0.06*   ------------------------------------------------------------------------------------------------------------------ Invalid input(s): POCBNP    Assessment & Plan  67 year old male patient with history of atrial flutter currently under hospitalist service for shortness of breath and atrial fibrillation with rapid rate.  1.  Atrial fibrillation with rapid rate Patient is on p.o. Cardizem, metoprolol still heart rate is around 125 bpm, appreciate cardiology following, increased  dose of beta-blockers, started on Coumadin.  Patient is on heparin continue heparin until INR is at least more than 1.5. And is on metoprolol 50 mg 4 times daily, Cardizem 60 mg every 12 hours. 2.  Acute respiratory distress with hypoxia'secondary to CHF: Improving.  Patient is off oxygen. .  3.  Systolic heart failure Continue Lasix for diuresis, hypokalemia, hypomagnesemia: Replace the potassium, magnesium. Further recommendations by cardiology  is on IV Lasix, continue for another 24 hours.  Patient wants to go home tomorrow.  Electrolytes, kidney function are acceptable. 4.  Elevated troponin secondary to demand ischemia  5.  DVT prophylaxis Currently on anticoagulation with IV heparin  drip Discharge disposition depends upon cardiology follow-up today.     Code Status Orders  (From admission, onward)        Start     Ordered   06/12/17 2143  Full code  Continuous     06/12/17 2142    Code Status History    Date Active Date Inactive Code Status Order ID Comments User Context   04/29/2017 2333 05/01/2017 1920 Full Code [id number]  Lance Coon, MD Inpatient      Time Spent in minutes   35  Greater than 50% of time spent in care coordination and counseling patient regarding the condition and plan of care.   Epifanio Lesches M.D on 06/15/2017 at 11:15 AM  Between 7am to 6pm - Pager - [phone]  After 6pm go to [email or web address] - Proofreader  [employer]   Office  [phone]

## 2017-06-15 NOTE — Progress Notes (Signed)
ANTICOAGULATION CONSULT NOTE - Initial Consult  Pharmacy Consult for heparin Indication: atrial fibrillation  No Known Allergies  Patient Measurements: Height: 6\' 5"  (195.6 cm) Weight: 228 lb 14.4 oz (103.8 kg) IBW/kg (Calculated) : 89.1 Heparin Dosing Weight: 95.3 kg  Vital Signs: Temp: 97.5 F (36.4 C) (03/31 1527) Temp Source: Axillary (03/31 1527) BP: 111/80 (03/31 1527) Pulse Rate: 97 (03/31 1527)  Labs: Recent Labs    06/12/17 1839 06/12/17 2002 06/12/17 2223 06/13/17 0542  06/14/17 0512 06/14/17 1102 06/14/17 1702 06/15/17 0511  HGB 14.5  --   --  13.9  --  13.3  --   --   --   HCT 44.0  --   --  42.9  --  39.4*  --   --   --   PLT 310  --   --  307  --  257  --   --   --   APTT  --  28  --   --   --   --   --   --   --   LABPROT  --  15.9*  --   --   --   --   --   --  14.7  INR  --  1.28  --   --   --   --   --   --  1.16  HEPARINUNFRC  --   --   --  0.23*   < > 0.21* 0.39 0.45 0.47  CREATININE 1.78*  --   --  1.83*  --  1.54*  --   --  1.44*  TROPONINI 0.04*  --  0.06*  --   --   --   --   --   --    < > = values in this interval not displayed.    Estimated Creatinine Clearance: 63.6 mL/min (A) (by C-G formula based on SCr of 1.44 mg/dL (H)).   Medical History: Past Medical History:  Diagnosis Date  . Atrial flutter ([place])    a. s/p TEE/DCCV 04/2017; b. CHADS2VASc => 3 (CHF, HTN, age x 1); c. not compliant with Xarelto  . Chronic combined systolic and diastolic CHF (congestive heart failure) ([place])    a. TTE 2/19: EF 20-25%, diffuse HK, mod dilated LA, mildly reduced RVSF, PASP 42; b. TTE 3/19: EF 20-25%, diffuse HK, RVSF nl, atrial flutter with RVR  . Essential hypertension   . Pulmonary hypertension (HCC)     Medications:  Medications Prior to Admission  Medication Sig Dispense Refill Last Dose  . atorvastatin (LIPITOR) 40 MG tablet Take 1 tablet (40 mg total) by mouth daily at 6 PM. 30 tablet 3 Past Week at Unknown time  . furosemide (LASIX) 40  MG tablet Take 1 tablet (40 mg total) by mouth daily. 30 tablet 3 06/12/2017 at am  . lisinopril (PRINIVIL,ZESTRIL) 20 MG tablet Take 1 tablet (20 mg total) by mouth daily. 90 tablet 3 06/12/2017 at am  . metoprolol succinate (TOPROL-XL) 50 MG 24 hr tablet Take 1 tablet (50 mg total) by mouth daily. Take with or immediately following a meal. 30 tablet 11 06/12/2017 at 1000  . rivaroxaban (XARELTO) 20 MG TABS tablet Take 1 tablet (20 mg total) by mouth daily. (Patient not taking: Reported on 06/12/2017) 30 tablet 11 Not Taking at Unknown time    Assessment: Patient admitted to [facility] for SOB w/ h/o aflutter; is supposed to be anticoagulated w/ xarelto PTA; however, patient cannot afford medication  and has not been taking it. Now transferred to [facility]. Patient has been therapeutic on heparin 1500 units/hr. Heparin bag was running at above rate per RN.   Goal of Therapy:  Heparin level 0.3-0.7 units/ml Monitor platelets by anticoagulation protocol: Yes   Plan:  -Continue IV heparin at 1500 units/hr -Monitor daily HL, CBC and s/s of bleeding   Albertina Parr, PharmD., BCPS Clinical Pharmacist If after 3:30pm, please call main pharmacy at: [phone]

## 2017-06-15 NOTE — H&P (View-Only) (Signed)
   Pt arrived from [facility] to room, [place]. He is getting settled into his room. Telemetry shows atrial flutter in the 90's. BP 11/80. Pt is still feeling "a little woozy", no chest discomfort or dyspnea. Admission orders placed and will be NPO after midnight for possible intervention tomorrow. IV heparin is infusing.   Daune Perch, AGNP-C [facility] 06/15/2017  4:44 PM Pager: [phone]

## 2017-06-15 NOTE — Progress Notes (Signed)
ANTICOAGULATION CONSULT NOTE - Initial Consult  Pharmacy Consult for heparin and warfarin Indication: atrial fibrillation  No Known Allergies  Patient Measurements: Height: 6\' 5"  (195.6 cm) Weight: 227 lb 9.6 oz (103.2 kg) IBW/kg (Calculated) : 89.1 Heparin Dosing Weight: 95.3 kg  Vital Signs: Temp: 98 F (36.7 C) (03/30 1947) Temp Source: Oral (03/30 1947) BP: 122/95 (03/30 1947) Pulse Rate: 126 (03/30 1947)  Labs: Recent Labs    06/12/17 1839 06/12/17 2002 06/12/17 2223 06/13/17 0542  06/14/17 0512 06/14/17 1102 06/14/17 1702 06/15/17 0511  HGB 14.5  --   --  13.9  --  13.3  --   --   --   HCT 44.0  --   --  42.9  --  39.4*  --   --   --   PLT 310  --   --  307  --  257  --   --   --   APTT  --  28  --   --   --   --   --   --   --   LABPROT  --  15.9*  --   --   --   --   --   --  14.7  INR  --  1.28  --   --   --   --   --   --  1.16  HEPARINUNFRC  --   --   --  0.23*   < > 0.21* 0.39 0.45 0.47  CREATININE 1.78*  --   --  1.83*  --  1.54*  --   --  1.44*  TROPONINI 0.04*  --  0.06*  --   --   --   --   --   --    < > = values in this interval not displayed.    Estimated Creatinine Clearance: 63.6 mL/min (A) (by C-G formula based on SCr of 1.44 mg/dL (H)).   Medical History: Past Medical History:  Diagnosis Date  . Atrial flutter ([place])    a. s/p TEE/DCCV 04/2017; b. CHADS2VASc => 3 (CHF, HTN, age x 1); c. not compliant with Xarelto  . Chronic combined systolic and diastolic CHF (congestive heart failure) ([place])    a. TTE 2/19: EF 20-25%, diffuse HK, mod dilated LA, mildly reduced RVSF, PASP 42; b. TTE 3/19: EF 20-25%, diffuse HK, RVSF nl, atrial flutter with RVR  . Essential hypertension   . Pulmonary hypertension (HCC)     Medications:  Scheduled:  . atorvastatin  40 mg Oral q1800  . diltiazem  60 mg Oral Q12H  . furosemide  40 mg Intravenous Q12H  . metoprolol tartrate  50 mg Oral QID  . sodium chloride flush  3 mL Intravenous Q12H  . Warfarin -  Pharmacist Dosing Inpatient   Does not apply q1800    Assessment: Patient admitted for SOB w/ h/o aflutter; is suppose to be anticoagulated w/ xarelto PTA; however, patient cannot afford medication and has not been taking it. Baseline labs confirm patient is not anticoagulated, will be started on heparin drip in-house for anticoagulation for afib.  Goal of Therapy:  Heparin level 0.3-0.7 units/ml Monitor platelets by anticoagulation protocol: Yes   Plan:  Heparin level therapeutic at 0.39. Will recheck level in 6 hours for confirmation 3/30@2030  HL 0.45, continue current rate will recheck with AM labs per protocol.   03/31 @ 0500 HL 0.47 therapeutic. Will continue current rate and will recheck w/ am labs.  Shanon Brow  Estil Daft, PharmD, BCPS Clinical Pharmacist 06/15/2017

## 2017-06-15 NOTE — Progress Notes (Signed)
Nutrition Education Note  RD consulted for nutrition education regarding new onset CHF.  RD provided "Low Sodium Nutrition Therapy" handout from the Academy of [employer]. Reviewed patient's dietary recall. Provided examples on ways to decrease sodium intake in diet. Discouraged intake of processed foods and use of salt shaker. Encouraged fresh fruits and vegetables as well as whole grain sources of carbohydrates to maximize fiber intake.   RD discussed why it is important for patient to adhere to diet recommendations, and emphasized the role of fluids, foods to avoid, and importance of weighing self daily. Teach back method used.  Expect good compliance.  Body mass index is 27 kg/m. Pt meets criteria for overweight based on current BMI.  Current diet order is HH, patient is consuming approximately 100% of meals at this time. Labs and medications reviewed. No further nutrition interventions warranted at this time. RD contact information provided. If additional nutrition issues arise, please re-consult RD.   Nathan Distance MS, RD, LDN Pager #- [phone] After Hours Pager: [phone]

## 2017-06-15 NOTE — Progress Notes (Signed)
ANTICOAGULATION CONSULT NOTE - Initial Consult  Pharmacy Consult for warfarin Indication: atrial fibrillation  No Known Allergies  Patient Measurements: Height: 6\' 5"  (195.6 cm) Weight: 227 lb 11.2 oz (103.3 kg) IBW/kg (Calculated) : 89.1 Heparin Dosing Weight: 95.3 kg  Vital Signs: Temp: 98.1 F (36.7 C) (03/31 0709) Temp Source: Oral (03/31 0640) BP: 131/102 (03/31 0709) Pulse Rate: 122 (03/31 0709)  Labs: Recent Labs    06/12/17 1839 06/12/17 2002 06/12/17 2223 06/13/17 0542  06/14/17 0512 06/14/17 1102 06/14/17 1702 06/15/17 0511  HGB 14.5  --   --  13.9  --  13.3  --   --   --   HCT 44.0  --   --  42.9  --  39.4*  --   --   --   PLT 310  --   --  307  --  257  --   --   --   APTT  --  28  --   --   --   --   --   --   --   LABPROT  --  15.9*  --   --   --   --   --   --  14.7  INR  --  1.28  --   --   --   --   --   --  1.16  HEPARINUNFRC  --   --   --  0.23*   < > 0.21* 0.39 0.45 0.47  CREATININE 1.78*  --   --  1.83*  --  1.54*  --   --  1.44*  TROPONINI 0.04*  --  0.06*  --   --   --   --   --   --    < > = values in this interval not displayed.    Estimated Creatinine Clearance: 63.6 mL/min (A) (by C-G formula based on SCr of 1.44 mg/dL (H)).   Medical History: Past Medical History:  Diagnosis Date  . Atrial flutter ([place])    a. s/p TEE/DCCV 04/2017; b. CHADS2VASc => 3 (CHF, HTN, age x 1); c. not compliant with Xarelto  . Chronic combined systolic and diastolic CHF (congestive heart failure) ([place])    a. TTE 2/19: EF 20-25%, diffuse HK, mod dilated LA, mildly reduced RVSF, PASP 42; b. TTE 3/19: EF 20-25%, diffuse HK, RVSF nl, atrial flutter with RVR  . Essential hypertension   . Pulmonary hypertension (HCC)     Medications:  Scheduled:  . atorvastatin  40 mg Oral q1800  . diltiazem  60 mg Oral Q12H  . furosemide  40 mg Intravenous Q12H  . metoprolol tartrate  50 mg Oral QID  . potassium chloride  40 mEq Oral BID  . sodium chloride flush  3 mL  Intravenous Q12H  . warfarin  7.5 mg Oral ONCE-1800  . Warfarin - Pharmacist Dosing Inpatient   Does not apply q1800    Assessment: Patient admitted for SOB w/ h/o aflutter; is suppose to be anticoagulated w/ xarelto PTA; however, patient cannot afford medication and has not been taking it. Baseline labs confirm patient is not anticoagulated, will be started on heparin drip in-house for anticoagulation for afib.  Goal of Therapy:  Heparin level 0.3-0.7 units/ml Monitor platelets by anticoagulation protocol: Yes   Plan:  INR 1.16 after first dose of warfarin given last night. Will give another dose of 7.5mg  tonight. Follow up INR in the AM  Abdulwahab Demelo D Tarnesha Ulloa, Pharm.D, BCPS Clinical Pharmacist  06/15/2017  

## 2017-06-15 NOTE — Progress Notes (Signed)
Completed as much as possible on the EMTALA form at this time. Patient informed of transfer. Called and gave report to [facility] at this time. Patient to transfer in the hospital gown. NT to help collect belongings. Will leave w/ IV in and a Heparin drip infusing. No report to [facility]. needed, since patient will be direct-admitted there. Will continue to monitor up and through transfer. Wenda Low [facility]

## 2017-06-16 ENCOUNTER — Encounter (HOSPITAL_COMMUNITY)
Admission: AD | Disposition: A | Discharge status: Home / Self Care | Payer: Self-pay | Source: Ambulatory Visit | Attending: Internal Medicine

## 2017-06-16 ENCOUNTER — Encounter (HOSPITAL_COMMUNITY): Payer: Self-pay | Admitting: *Deleted

## 2017-06-16 ENCOUNTER — Inpatient Hospital Stay (HOSPITAL_COMMUNITY): Payer: Medicare HMO

## 2017-06-16 DIAGNOSIS — I4892 Unspecified atrial flutter: Principal | ICD-10-CM

## 2017-06-16 DIAGNOSIS — I483 Typical atrial flutter: Secondary | ICD-10-CM

## 2017-06-16 DIAGNOSIS — I361 Nonrheumatic tricuspid (valve) insufficiency: Secondary | ICD-10-CM

## 2017-06-16 HISTORY — PX: A-FLUTTER ABLATION: EP1230

## 2017-06-16 HISTORY — PX: TEE WITHOUT CARDIOVERSION: SHX5443

## 2017-06-16 LAB — BASIC METABOLIC PANEL
ANION GAP: 13 (ref 5–15)
BUN: 35 mg/dL — ABNORMAL HIGH (ref 6–20)
CALCIUM: 9.3 mg/dL (ref 8.9–10.3)
CHLORIDE: 103 mmol/L (ref 101–111)
CO2: 26 mmol/L (ref 22–32)
Creatinine, Ser: 1.67 mg/dL — ABNORMAL HIGH (ref 0.61–1.24)
GFR calc non Af Amer: 41 mL/min — ABNORMAL LOW (ref >60)
GFR, EST AFRICAN AMERICAN: 48 mL/min — AB (ref >60)
Glucose, Bld: 85 mg/dL (ref 65–99)
Potassium: 3.4 mmol/L — ABNORMAL LOW (ref 3.5–5.1)
SODIUM: 142 mmol/L (ref 135–145)

## 2017-06-16 LAB — CBC
HCT: 41.3 % (ref 39.0–52.0)
Hemoglobin: 13.8 g/dL (ref 13.0–17.0)
MCH: 29.8 pg (ref 26.0–34.0)
MCHC: 33.4 g/dL (ref 30.0–36.0)
MCV: 89.2 fL (ref 78.0–100.0)
Platelets: 311 10*3/uL (ref 150–400)
RBC: 4.63 MIL/uL (ref 4.22–5.81)
RDW: 15.6 % — AB (ref 11.5–15.5)
WBC: 5.7 10*3/uL (ref 4.0–10.5)

## 2017-06-16 LAB — HEPARIN LEVEL (UNFRACTIONATED): Heparin Unfractionated: <0.1 IU/mL — ABNORMAL LOW (ref 0.30–0.70)

## 2017-06-16 SURGERY — A-FLUTTER ABLATION

## 2017-06-16 SURGERY — ECHOCARDIOGRAM, TRANSESOPHAGEAL
Anesthesia: Moderate Sedation

## 2017-06-16 MED ORDER — METOPROLOL TARTRATE 5 MG/5ML IV SOLN
5.0000 mg | Freq: Once | INTRAVENOUS | Status: AC
  Administered 2017-06-16: 5 mg via INTRAVENOUS
  Filled 2017-06-16: qty 5

## 2017-06-16 MED ORDER — HYDRALAZINE HCL 20 MG/ML IJ SOLN
INTRAMUSCULAR | Status: AC
  Filled 2017-06-16: qty 1

## 2017-06-16 MED ORDER — METOPROLOL TARTRATE 5 MG/5ML IV SOLN
INTRAVENOUS | Status: AC
  Filled 2017-06-16: qty 5

## 2017-06-16 MED ORDER — ONDANSETRON HCL 4 MG/2ML IJ SOLN: 4.0000 mg | Freq: Four times a day (QID) | INTRAMUSCULAR | Status: DC | PRN

## 2017-06-16 MED ORDER — METOPROLOL TARTRATE 5 MG/5ML IV SOLN
INTRAVENOUS | Status: DC | PRN
  Administered 2017-06-16: 5 mg via INTRAVENOUS

## 2017-06-16 MED ORDER — BUPIVACAINE HCL (PF) 0.25 % IJ SOLN
INTRAMUSCULAR | Status: DC | PRN
  Administered 2017-06-16: 45 mL

## 2017-06-16 MED ORDER — MIDAZOLAM HCL 5 MG/5ML IJ SOLN
INTRAMUSCULAR | Status: DC | PRN
  Administered 2017-06-16 (×5): 1 mg via INTRAVENOUS

## 2017-06-16 MED ORDER — MIDAZOLAM HCL 5 MG/5ML IJ SOLN
INTRAMUSCULAR | Status: AC
  Filled 2017-06-16: qty 5

## 2017-06-16 MED ORDER — FENTANYL CITRATE (PF) 100 MCG/2ML IJ SOLN
INTRAMUSCULAR | Status: AC
  Filled 2017-06-16: qty 2

## 2017-06-16 MED ORDER — MIDAZOLAM HCL 10 MG/2ML IJ SOLN
INTRAMUSCULAR | Status: DC | PRN
  Administered 2017-06-16: 1 mg via INTRAVENOUS
  Administered 2017-06-16 (×4): 2 mg via INTRAVENOUS

## 2017-06-16 MED ORDER — HYDRALAZINE HCL 20 MG/ML IJ SOLN
INTRAMUSCULAR | Status: DC | PRN
  Administered 2017-06-16: 10 mg via INTRAVENOUS

## 2017-06-16 MED ORDER — FENTANYL CITRATE (PF) 100 MCG/2ML IJ SOLN
INTRAMUSCULAR | Status: DC | PRN
  Administered 2017-06-16: 25 ug via INTRAVENOUS
  Administered 2017-06-16 (×3): 12.5 ug via INTRAVENOUS

## 2017-06-16 MED ORDER — HEPARIN (PORCINE) IN NACL 2-0.9 UNIT/ML-% IJ SOLN
INTRAMUSCULAR | Status: AC | PRN
  Administered 2017-06-16: 500 mL

## 2017-06-16 MED ORDER — SODIUM CHLORIDE 0.9 % IV SOLN
INTRAVENOUS | Status: DC
  Administered 2017-06-16: 13:00:00 via INTRAVENOUS

## 2017-06-16 MED ORDER — SODIUM CHLORIDE 0.9% FLUSH: 3.0000 mL | Freq: Two times a day (BID) | INTRAVENOUS | Status: DC

## 2017-06-16 MED ORDER — HEPARIN (PORCINE) IN NACL 2-0.9 UNIT/ML-% IJ SOLN
INTRAMUSCULAR | Status: AC
  Filled 2017-06-16: qty 500

## 2017-06-16 MED ORDER — FENTANYL CITRATE (PF) 100 MCG/2ML IJ SOLN
INTRAMUSCULAR | Status: DC | PRN
  Administered 2017-06-16 (×3): 25 ug via INTRAVENOUS

## 2017-06-16 MED ORDER — HEPARIN BOLUS VIA INFUSION
3000.0000 [IU] | Freq: Once | INTRAVENOUS | Status: AC
  Administered 2017-06-16: 3000 [IU] via INTRAVENOUS
  Filled 2017-06-16: qty 3000

## 2017-06-16 MED ORDER — SODIUM CHLORIDE 0.9% FLUSH: 3.0000 mL | INTRAVENOUS | Status: DC | PRN

## 2017-06-16 MED ORDER — BUTAMBEN-TETRACAINE-BENZOCAINE 2-2-14 % EX AERO
INHALATION_SPRAY | CUTANEOUS | Status: DC | PRN
  Administered 2017-06-16: 2 via TOPICAL

## 2017-06-16 MED ORDER — ACETAMINOPHEN 325 MG PO TABS: 650.0000 mg | ORAL_TABLET | ORAL | Status: DC | PRN

## 2017-06-16 MED ORDER — MIDAZOLAM HCL 5 MG/ML IJ SOLN
INTRAMUSCULAR | Status: AC
  Filled 2017-06-16: qty 2

## 2017-06-16 MED ORDER — RIVAROXABAN 20 MG PO TABS
20.0000 mg | ORAL_TABLET | Freq: Every day | ORAL | Status: DC
  Administered 2017-06-16: 20 mg via ORAL
  Filled 2017-06-16: qty 1

## 2017-06-16 MED ORDER — BUPIVACAINE HCL (PF) 0.25 % IJ SOLN
INTRAMUSCULAR | Status: AC
  Filled 2017-06-16: qty 60

## 2017-06-16 MED ORDER — METOPROLOL SUCCINATE ER 50 MG PO TB24: 50.0000 mg | ORAL_TABLET | Freq: Every day | ORAL | 0 refills | Status: DC

## 2017-06-16 MED ORDER — SODIUM CHLORIDE 0.9 % IV SOLN: 250.0000 mL | INTRAVENOUS | Status: DC | PRN

## 2017-06-16 SURGICAL SUPPLY — 10 items
BAG SNAP BAND KOVER 36X36 (MISCELLANEOUS) ×2 IMPLANT
CATH BLAZERPRIME XP (ABLATOR) ×2 IMPLANT
CATH JOSEPHSON QUAD-ALLRED 6FR (CATHETERS) ×2 IMPLANT
CATH POLARIS X 2.5/5/2.5 DECAP (CATHETERS) ×2 IMPLANT
PACK EP LATEX FREE (CUSTOM PROCEDURE TRAY) ×1
PACK EP LF (CUSTOM PROCEDURE TRAY) ×1 IMPLANT
PAD DEFIB LIFELINK (PAD) ×2 IMPLANT
SHEATH AVANTI 11CM 6FR (SHEATH) ×2 IMPLANT
SHEATH AVANTI 11CM 8FR (SHEATH) ×4 IMPLANT
SHIELD RADPAD SCOOP 12X17 (MISCELLANEOUS) ×2 IMPLANT

## 2017-06-16 NOTE — Progress Notes (Signed)
Spoke with Dr Kenton Kingfisher on call for Cardiology in regards to IV Heparin clarification post AFib ablation this evening. Orders received to DC IV Heparin and restart home Dose Xarelto at 2100.

## 2017-06-16 NOTE — Progress Notes (Signed)
ANTICOAGULATION CONSULT NOTE - Follow Up Consult  Pharmacy Consult for Heparin Indication: atrial fibrillation  No Known Allergies  Patient Measurements: Height: 6\' 5"  (195.6 cm) Weight: 226 lb 4.8 oz (102.6 kg) IBW/kg (Calculated) : 89.1 Heparin Dosing Weight: 95 kg  Vital Signs: Temp: 97.7 F (36.5 C) (04/01 0453) Temp Source: Oral (04/01 0453) BP: 116/92 (04/01 0453) Pulse Rate: 123 (04/01 0453)  Labs: Recent Labs    06/14/17 0512  06/14/17 1702 06/15/17 0511 06/16/17 0441  HGB 13.3  --   --   --  13.8  HCT 39.4*  --   --   --  41.3  PLT 257  --   --   --  311  LABPROT  --   --   --  14.7  --   INR  --   --   --  1.16  --   HEPARINUNFRC 0.21*   < > 0.45 0.47 <0.10*  CREATININE 1.54*  --   --  1.44* 1.67*   < > = values in this interval not displayed.    Estimated Creatinine Clearance: 54.8 mL/min (A) (by C-G formula based on SCr of 1.67 mg/dL (H)).  Assessment:  67 yr old male on IV heparin for atrial fibrillation.  Transferred from [facility] on 3/31 for TEE then ablation later today.  Was supposed to be on Xarelto prior to admission, but had not been taking due to cost.  Coumadin was begun on 3/30 then dc'd on 3/31.  Received Coumadin 7.5 mg x 1 on 3/30.  Noted preference for NOAC if able.     Heparin levels had been therapeutic on 1500 units/hr, then undetectable (<0.1) this morning. Lost IV access overnight, thought exact timing unknown. Heparin level drawn at 441am, so off at that time. IV access re-established ~6am and drip resumed.  Goal of Therapy:  Heparin level 0.3-0.7 units/ml Monitor platelets by anticoagulation protocol: Yes   Plan:   Heparin 3000 units IV x 1 bolus given ~9am.  Continue heparin drip at 1500 units/hr.  Daily heparin level and CBC while on heparin.  Heparin to be held for procedures later today.  Will follow up post-procedures for anticoagulation plans.  Arty Baumgartner, [place] Pager: [phone] 06/16/2017,9:53 AM

## 2017-06-16 NOTE — Progress Notes (Signed)
Patient consented for TEE and EP Study/Ablation.  Bilateral groins clipped.

## 2017-06-16 NOTE — Progress Notes (Signed)
Provider on call paged via amion for Heparin clarification post ablation. Jessie Foot, RN

## 2017-06-16 NOTE — Interval H&P Note (Signed)
History and Physical Interval Note:  06/16/2017 11:49 AM  Nathan [name].  has presented today for surgery, with the diagnosis of a flutter, pre ablation  The various methods of treatment have been discussed with the patient and family. After consideration of risks, benefits and other options for treatment, the patient has consented to  Procedure(s): TRANSESOPHAGEAL ECHOCARDIOGRAM (TEE) (N/A) as a surgical intervention .  The patient's history has been reviewed, patient examined, no change in status, stable for surgery.  I have reviewed the patient's chart and labs.  Questions were answered to the patient's satisfaction.     Ena Dawley

## 2017-06-16 NOTE — CV Procedure (Signed)
   Transesophageal Echocardiogram Note  Nathan [name] [id number] [birth year]  Procedure: Transesophageal Echocardiogram Indications: atrial flutter with RVR  Procedure Details Consent: Obtained Time Out: Verified patient identification, verified procedure, site/side was marked, verified correct patient position, special equipment/implants available, Radiology Safety Procedures followed,  medications/allergies/relevent history reviewed, required imaging and test results available.  Performed  Medications: During this procedure the patient is administered a total of Versed 9 mg and Fentanyl 75 mcg to achieve and maintain moderate conscious sedation.  The patient's heart rate, blood pressure, and oxygen saturation are monitored continuously during the procedure. The period of conscious sedation is 30 minutes, of which I was present face-to-face 100% of this time.  LVEF 20-25% with diffuse hypokinesis, trivial MR, moderately decreased RV systolic function, moderate to severe TR, RVSP 45 mmHg, large left atrium, smoke but no thrombus in the left atrium or left atrial appendage  Complications: No apparent complications Patient did tolerate procedure well.  Ena Dawley, MD, [facility] 06/16/2017, 2:05 PM

## 2017-06-16 NOTE — Progress Notes (Addendum)
Site area: RFV x 3 Site Prior to Removal:  Level 0 Pressure Applied For:15 min Manual:  yes  Patient Status During Pull:  stable Post Pull Site:  Level 0 Post Pull Instructions Given:  yes Post Pull Pulses Present:  Dressing Applied:  tegaderm Bedrest begins @ 9050 till 1145 Comments:

## 2017-06-16 NOTE — Progress Notes (Signed)
Dr Kenton Kingfisher notified pt having burst of SVT  Non sustained w/rates up to 150's-160's then back to NSR 60's. No new orders at this time will continue to monitor. Jessie Foot, RN

## 2017-06-16 NOTE — Plan of Care (Signed)
Pt sleeping comfortably.

## 2017-06-16 NOTE — Discharge Summary (Signed)
Nathan [name]., is a 67 y.o. male  DOB Jul 19, 1950  MRN [id number].  Admission date:  06/15/2017  Admitting Physician  Fay Records, MD  Discharge Date:  06/16/2017   Primary MD  Patient, [name]  Recommendations for primary care physician for things to follow:  Being transferred to [facility] foratrial ablation for uncontrolled atrial flutter   Admission Diagnosis  SOB   Discharge Diagnosis  SOB    Principal Problem:   Atrial flutter with rapid ventricular response ([place]) Active Problems:   Elevated troponin   AKI (acute kidney injury) ([place])   Atrial flutter ([place])   Acute on chronic systolic heart failure ([place])      Past Medical History:  Diagnosis Date  . Atrial flutter ([place])    a. s/p TEE/DCCV 04/2017; b. CHADS2VASc => 3 (CHF, HTN, age x 1); c. not compliant with Xarelto  . Chronic combined systolic and diastolic CHF (congestive heart failure) ([place])    a. TTE 2/19: EF 20-25%, diffuse HK, mod dilated LA, mildly reduced RVSF, PASP 42; b. TTE 3/19: EF 20-25%, diffuse HK, RVSF nl, atrial flutter with RVR  . Essential hypertension   . Pulmonary hypertension ([place])     Past Surgical History:  Procedure Laterality Date  . CARDIOVERSION N/A 05/01/2017   Procedure: CARDIOVERSION;  Surgeon: [name], MD;  Location: [facility] ORS;  Service: Cardiovascular;  Laterality: N/A;  . TEE WITHOUT CARDIOVERSION N/A 05/01/2017   Procedure: TRANSESOPHAGEAL ECHOCARDIOGRAM (TEE);  Surgeon: [name], MD;  Location: [facility] ORS;  Service: Cardiovascular;  Laterality: N/A;       History of present illness and  Hospital Course:     Kindly see H&P for history of present illness and admission details, please review complete Labs, Consult reports and Test reports for all details in brief  HPI  from the history and physical  done on the day of admission 67 year old male patient with history of atrial flutter, hypertension, systolic heart failure with EF of 20-25%, history of cardioversion February 2019, noncompliance with anticoagulation, noncompliant with appointments with PCP physician came in because of shortness of breath, palpitation and found to have atrial fibrillation with RVR, congestive heart failure flareup.   Hospital Course  #1 acute on chronic systolic heart failure with EF of 20%, patient admitted to telemetry, continue to receive IV Lasix, patient shortness of breath improving, because of uncontrolled atrial flutter patient is being transferred to [facility] today ablation. 2.  Severe hypokalemia secondary to diuretics: Potassium supplements. 3.  Atrial flutter with uncontrolled heart rate: Patient is being transferred to [facility] for ablation on Monday.  Patient is on heparin drip.  In the hospital.  Patient received a high-dose beta-blocker, cardiac calcium blocker but because of persistent tachycardia with heart rate up to 120 bpm patient being transferred to [facility] for atrial ablation.     Discharge Condition: Stable   Follow UP      Discharge Instructions  and  Discharge Medications      Allergies as of 06/16/2017   No Known Allergies     Medication List    STOP taking these medications   atorvastatin 40 MG tablet Commonly known as:  LIPITOR   furosemide 40 MG tablet Commonly known as:  LASIX   lisinopril 20 MG tablet Commonly known as:  PRINIVIL,ZESTRIL   rivaroxaban 20 MG Tabs tablet Commonly known as:  XARELTO     TAKE these medications   metoprolol succinate 50  MG 24 hr tablet Commonly known as:  TOPROL-XL Take 1 tablet (50 mg total) by mouth daily. Take with or immediately following a meal.         Continue Lasix as per [facility] and continue heparin drip as per MAR   diet and Activity recommendation: See Discharge Instructions above   Consults obtained  -cardiology   Major procedures and Radiology Reports - PLEASE review detailed and final reports for all details, in brief -      Dg Chest 2 View  Result Date: 06/12/2017 CLINICAL DATA:  Shortness of breath and fatigue EXAM: CHEST - 2 VIEW COMPARISON:  April 29, 2017 FINDINGS: There is no appreciable edema or consolidation. There is central interstitial prominence. Heart is slightly enlarged with pulmonary vascularity within normal limits. No adenopathy. No bone lesions. IMPRESSION: Central bronchitis. No edema or consolidation. Mild cardiac enlargement. Electronically Signed   By: Lowella Grip III M.D.   On: 06/12/2017 19:05    Micro Results     No results found for this or any previous visit (from the past 240 hour(s)).     Today   Subjective:   [name] today for transfer to [facility].  Objective:   Blood pressure (!) 116/92, pulse (!) 123, temperature 97.7 F (36.5 C), temperature source Oral, resp. rate 16, height 6\' 5"  (1.956 m), weight 102.6 kg (226 lb 4.8 oz), SpO2 94 %.   Intake/Output Summary (Last 24 hours) at 06/16/2017 0905 Last data filed at 06/16/2017 0811 Gross per 24 hour  Intake 12.75 ml  Output 850 ml  Net -837.25 ml    Exam Awake Alert, Oriented x 3, No new F.N deficits, Normal affect [place].AT,PERRAL Supple Neck,No JVD, No cervical lymphadenopathy appriciated.  Symmetrical Chest wall movement, Good air movement bilaterally, CTAB RRR,No Gallops,Rubs or new Murmurs, No Parasternal Heave +ve B.Sounds, Abd Soft, Non tender, No organomegaly appriciated, No rebound -guarding or rigidity. No Cyanosis, Clubbing or edema, No new Rash or bruise  Data Review   CBC w Diff:  Lab Results  Component Value Date   WBC 5.7 06/16/2017   HGB 13.8 06/16/2017   HCT 41.3 06/16/2017   PLT 311 06/16/2017   LYMPHOPCT 35 04/29/2017   MONOPCT 17 04/29/2017   EOSPCT 1 04/29/2017   BASOPCT 1 04/29/2017    CMP:  Lab Results  Component Value Date   NA 142  06/16/2017   K 3.4 (L) 06/16/2017   CL 103 06/16/2017   CO2 26 06/16/2017   BUN 35 (H) 06/16/2017   CREATININE 1.67 (H) 06/16/2017   PROT 7.4 04/29/2017   ALBUMIN 3.5 04/29/2017   BILITOT 0.8 04/29/2017   ALKPHOS 87 04/29/2017   AST 45 (H) 04/29/2017   ALT 61 04/29/2017  .   Total Time in preparing paper work, data evaluation and todays exam - 35 minutes  Epifanio Lesches M.D on 06/15/2017 at 9:05 AM    Note: This dictation was prepared with Dragon dictation along with smaller phrase technology. Any transcriptional errors that result from this process are unintentional.

## 2017-06-16 NOTE — Consult Note (Addendum)
Cardiology Consultation:   Patient ID: Nathan [name].; [id number]; 1950-09-30   Admit date: 06/15/2017 Date of Consult: 06/16/2017  Primary Care Provider: Patient, [name] Primary Cardiologist: Dr. Rockey Situ    Patient Profile:   Nathan Westbrooks. is a 67 y.o. male with a hx of fairly new finding of CHF (mixed systolic/diastolic) in Feb 8295 and AFlutter at the same hospitalization, HTN, and p.HTN, + smoker who is being seen today for the evaluation of AFlutter at the request of Dr. Harrington Challenger.  History of Present Illness:   Mr. Grandison was first consulted by cardiology in Feb during a hospital stay with new on set AFlutter/RVR without prior cardiac history known for him.  He was started on a/c and rate control, treated for acute CHF/diuresis, echo noted LVEF 20-25% (done while in rapid flutter), had TEE/DCCV >> SR.  It was felt he would need [facility] though given post DCCV planned after a month of uninterrupted a/c.  Noted during the stay the patient decllined medicines on occasion and persistently wanted to leave the hospital prior to recommended discharge timing.  At his f/u out patient with Dr. Karl Bales only a week or 2 afterwards remained in SR, referred to EP though was a no-show to that appt.  He was re-admitted to [facility] 06/13/17, with increasing DOE/SOB and palpitations, was found again in AFlutter RVR.  He reported taking all meds EXCEPT the xarelto that was too expensive.  He was admitted, started on IV lasix for fluid OL, heparin gtt, and BB adjusted, in effort to avoid CCB given CHF.  He was transferred to [facility] last night for EP evaluation, TEE/possible EPS/ablation of his AFlutter.  BP has been stable.  He is feeling well currently, no rest SOB, no CP, is aware of fast heart rate.  No reports of syncope or near syncope.  He tells me he was in-fact taking xarelto, though unable to get refill of any of his medicines for about 5 days prior to coming in  LABS: K+ 3.4 (being replaced) BUN/Creat  35/1.67 WBC 5.7 H/H 13/41 Plts 311 TSH 4.001    Past Medical History:  Diagnosis Date  . Atrial flutter ([place])    a. s/p TEE/DCCV 04/2017; b. CHADS2VASc => 3 (CHF, HTN, age x 1); c. not compliant with Xarelto  . Chronic combined systolic and diastolic CHF (congestive heart failure) ([place])    a. TTE 2/19: EF 20-25%, diffuse HK, mod dilated LA, mildly reduced RVSF, PASP 42; b. TTE 3/19: EF 20-25%, diffuse HK, RVSF nl, atrial flutter with RVR  . Essential hypertension   . Pulmonary hypertension ([place])     Past Surgical History:  Procedure Laterality Date  . CARDIOVERSION N/A 05/01/2017   Procedure: CARDIOVERSION;  Surgeon: [name], MD;  Location: [facility] ORS;  Service: Cardiovascular;  Laterality: N/A;  . TEE WITHOUT CARDIOVERSION N/A 05/01/2017   Procedure: TRANSESOPHAGEAL ECHOCARDIOGRAM (TEE);  Surgeon: [name], MD;  Location: [facility] ORS;  Service: Cardiovascular;  Laterality: N/A;       Inpatient Medications: Scheduled Meds: . atorvastatin  40 mg Oral q1800  . furosemide  40 mg Intravenous Q12H  . metoprolol succinate  50 mg Oral Daily  . potassium chloride  20 mEq Oral TID   Continuous Infusions: . sodium chloride    . heparin 1,500 Units/hr (06/16/17 0855)   PRN Meds: acetaminophen, ondansetron (ZOFRAN) IV  Allergies:   No Known Allergies  Social History:   Social History   Socioeconomic History  .  Marital status: Single    Spouse name: Not on file  . Number of children: 1  . Years of education: Not on file  . Highest education level: Not on file  Occupational History  . Occupation: [employer]    Comment: Theatre manager  Social Needs  . Financial resource strain: Not hard at all  . Food insecurity:    Worry: Never true    Inability: Never true  . Transportation needs:    Medical: Yes    Non-medical: Yes  Tobacco Use  . Smoking status: Former Research scientist (life sciences)  . Smokeless tobacco: Never Used  Substance and Sexual Activity  . Alcohol use: Yes     Alcohol/week: 1.8 oz    Types: 3 Cans of beer per week    Frequency: Never    Comment: Drink a half of a 40oz beer every day, drank heavily in the past  . Drug use: Never  . Sexual activity: Yes    Partners: Female  Lifestyle  . Physical activity:    Days per week: 7 days    Minutes per session: 20 min  . Stress: Not at all  Relationships  . Social connections:    Talks on phone: More than three times a week    Gets together: Once a week    Attends religious service: 1 to 4 times per year    Active member of club or organization: No    Attends meetings of clubs or organizations: Never    Relationship status: Divorced  . Intimate partner violence:    Fear of current or ex partner: No    Emotionally abused: No    Physically abused: No    Forced sexual activity: No  Other Topics Concern  . Not on file  Social History Narrative  . Not on file    Family History:    Family History  Problem Relation Age of Onset  . Alzheimer's disease Mother   . Alzheimer's disease Father      ROS:  Please see the history of present illness.  All other ROS reviewed and negative.     Physical Exam/Data:   Vitals:   06/15/17 1527 06/15/17 1956 06/16/17 0453  BP: 111/80 (!) 120/97 (!) 116/92  Pulse: 97 (!) 114 (!) 123  Resp: 18 16 16   Temp: (!) 97.5 F (36.4 C) (!) 97.5 F (36.4 C) 97.7 F (36.5 C)  TempSrc: Axillary Oral Oral  SpO2: 100% 100% 94%  Weight: 228 lb 14.4 oz (103.8 kg)  226 lb 4.8 oz (102.6 kg)  Height: 6\' 5"  (1.956 m)      Intake/Output Summary (Last 24 hours) at 06/16/2017 0910 Last data filed at 06/16/2017 0811 Gross per 24 hour  Intake 12.75 ml  Output 850 ml  Net -837.25 ml   Filed Weights   06/15/17 1527 06/16/17 0453  Weight: 228 lb 14.4 oz (103.8 kg) 226 lb 4.8 oz (102.6 kg)   Body mass index is 26.84 kg/m.  General:  Well nourished, well developed, in no acute distress HEENT: normal Lymph: no adenopathy Neck: no JVD Endocrine:  No  thryomegaly Vascular: No carotid bruits  Cardiac:  RRR/tachycardic; no murmurs, gallops or rubs Lungs:  CTA b/l, no wheezing, rhonchi or rales  Abd: soft, nontender  Ext: no edema Musculoskeletal:  No deformities, BUE and BLE strength normal and equal Skin: warm and dry  Neuro: no gross focal abnormalities noted Psych:  Normal affect   EKG:  The EKG was personally reviewed  and demonstrates:   #1 has an alternating pattern, unclear underlying rhythm, 175bpm #2 is AFlutter 2:1, 126bpm 05/01/17: SB 57bpm, PR is 12oms, QRS 40ms, QTc 424ms Telemetry:  Telemetry was personally reviewed and demonstrates:   AFlutter 120's  Relevant CV Studies:  05/21/17: TTE Study Conclusions - Left ventricle: The cavity size was mildly dilated. Systolic   function was severely reduced. The estimated ejection fraction   was <20% Diffuse hypokinesis. Regional wall motion abnormalities   cannot be excluded. The study is not technically sufficient to   allow evaluation of LV diastolic function. - Mitral valve: There was mild regurgitation. - Left atrium: The atrium was mildly dilated. - Right ventricle: Poorly visualized. Systolic function was normal. - Pulmonary arteries: Systolic pressure could not be accurately   estimated. Impressions: - Rhythm concerning for atrial flutter, rate 130 bpm s/p   cardioversion 05/01/2017, now back in atrial flutter.  TTE 04/30/17: Study Conclusions - Left ventricle: The cavity size was normal. Systolic function was severely reduced. The estimated ejection fraction was in the range of 20% to 25%. Diffuse hypokinesis. Regional wall motion abnormalities cannot be excluded. The study is not technically sufficient to allow evaluation of LV diastolic function. - Left atrium: The atrium was moderately dilated. - Right ventricle: Systolic function was mildly reduced. - Pulmonary arteries: Systolic pressure was mildly elevated. PA peak pressure: 42 mm Hg  (S). Impressions - Rhythm is atrial flutter.    Laboratory Data:  Chemistry Recent Labs  Lab 06/14/17 0512 06/15/17 0511 06/16/17 0441  NA 141 141 142  K 3.0* 2.9* 3.4*  CL 106 101 103  CO2 24 28 26   GLUCOSE 92 100* 85  BUN 33* 31* 35*  CREATININE 1.54* 1.44* 1.67*  CALCIUM 8.7* 8.7* 9.3  GFRNONAA 45* 49* 41*  GFRAA 53* 57* 48*  ANIONGAP 11 12 13     No results for input(s): PROT, ALBUMIN, AST, ALT, ALKPHOS, BILITOT in the last 168 hours. Hematology Recent Labs  Lab 06/13/17 0542 06/14/17 0512 06/16/17 0441  WBC 7.6 6.2 5.7  RBC 4.71 4.44 4.63  HGB 13.9 13.3 13.8  HCT 42.9 39.4* 41.3  MCV 90.9 88.8 89.2  MCH 29.5 29.8 29.8  MCHC 32.4 33.6 33.4  RDW 15.7* 15.5* 15.6*  PLT 307 257 311   Cardiac Enzymes Recent Labs  Lab 06/12/17 1839 06/12/17 2223  TROPONINI 0.04* 0.06*   No results for input(s): TROPIPOC in the last 168 hours.  BNP Recent Labs  Lab 06/12/17 1839  BNP 1,839.0*    DDimer No results for input(s): DDIMER in the last 168 hours.  Radiology/Studies:   Dg Chest 2 View Result Date: 06/12/2017 CLINICAL DATA:  Shortness of breath and fatigue EXAM: CHEST - 2 VIEW COMPARISON:  April 29, 2017 FINDINGS: There is no appreciable edema or consolidation. There is central interstitial prominence. Heart is slightly enlarged with pulmonary vascularity within normal limits. No adenopathy. No bone lesions. IMPRESSION: Central bronchitis. No edema or consolidation. Mild cardiac enlargement. Electronically Signed   By: Lowella Grip III M.D.   On: 06/12/2017 19:05    Assessment and Plan:   1. AFlutter w/RVR  Dr. Lovena Le will see later this morning Planned for TEE and possible EPS/ablation this afternoon, discussed with the patient procedure, risks and benefits, he reports Dr. Harrington Challenger discussed TEE with him, no f/u questions regarding this procedure (has had before), he would like to proceed with both  2. Acute/chronic CHF     Feeling much better  Weight is down 11lbs  3. Mild Trop elevation     Felt by cardiology team to be demand w/RVR and AKI     No anginal sounding complaints    For questions or updates, please contact [facility] Please consult [email or web address] for contact info under Cardiology/STEMI.   Signed, Baldwin Jamaica, PA-C  06/16/2017 9:10 AM  EP attending  Patient seen and examined.  Agree with the findings as noted above.  The patient is a 67 year old man with a history of chronic as well as acute systolic heart failure who presented to the hospital decompensated, and underwent TEE demonstrating no left atrial appendage thrombus.  The patient also has had atrial fibrillation.  His atrial flutter appears to be dominant at this time.  I discussed the treatment options with the patient.  Because he has had persistent atrial flutter, I have recommended proceeding with catheter ablation.  He may also require additional antiarrhythmic therapy.  The risks, goals, benefits, and expectations of catheter ablation were reviewed with the patient and he wishes to proceed.  Crissie Sickles, MD

## 2017-06-16 NOTE — Progress Notes (Signed)
To Cath Lab via bed.  Heparin stopped OC and disconnected from the patient.  IV flushed with NS and clamped.

## 2017-06-17 ENCOUNTER — Other Ambulatory Visit: Payer: Self-pay | Admitting: Physician Assistant

## 2017-06-17 ENCOUNTER — Encounter (HOSPITAL_COMMUNITY): Payer: Self-pay | Admitting: Internal Medicine

## 2017-06-17 ENCOUNTER — Telehealth: Payer: Self-pay

## 2017-06-17 DIAGNOSIS — I5043 Acute on chronic combined systolic (congestive) and diastolic (congestive) heart failure: Secondary | ICD-10-CM

## 2017-06-17 DIAGNOSIS — Z79899 Other long term (current) drug therapy: Secondary | ICD-10-CM

## 2017-06-17 LAB — CBC
HCT: 45.5 % (ref 39.0–52.0)
HEMOGLOBIN: 14.9 g/dL (ref 13.0–17.0)
MCH: 29.4 pg (ref 26.0–34.0)
MCHC: 32.7 g/dL (ref 30.0–36.0)
MCV: 89.9 fL (ref 78.0–100.0)
PLATELETS: 289 10*3/uL (ref 150–400)
RBC: 5.06 MIL/uL (ref 4.22–5.81)
RDW: 15.7 % — ABNORMAL HIGH (ref 11.5–15.5)
WBC: 6.9 10*3/uL (ref 4.0–10.5)

## 2017-06-17 LAB — T3, FREE: T3 FREE: 2.6 pg/mL (ref 2.0–4.4)

## 2017-06-17 MED ORDER — RIVAROXABAN 20 MG PO TABS: 20.0000 mg | ORAL_TABLET | Freq: Every day | ORAL | 6 refills | Status: DC

## 2017-06-17 MED ORDER — LISINOPRIL 20 MG PO TABS: 20.0000 mg | ORAL_TABLET | Freq: Every day | ORAL | 6 refills | Status: DC

## 2017-06-17 MED ORDER — FUROSEMIDE 40 MG PO TABS: 40.0000 mg | ORAL_TABLET | Freq: Every day | ORAL | 5 refills | Status: DC

## 2017-06-17 MED ORDER — FUROSEMIDE 40 MG PO TABS: 40.0000 mg | ORAL_TABLET | Freq: Every day | ORAL | 11 refills | Status: DC

## 2017-06-17 MED ORDER — RIVAROXABAN 20 MG PO TABS: 20.0000 mg | ORAL_TABLET | Freq: Every day | ORAL | Status: DC

## 2017-06-17 MED ORDER — METOPROLOL SUCCINATE ER 50 MG PO TB24: 50.0000 mg | ORAL_TABLET | Freq: Every day | ORAL | 6 refills | Status: DC

## 2017-06-17 MED ORDER — AMIODARONE HCL 200 MG PO TABS: 200.0000 mg | ORAL_TABLET | Freq: Every day | ORAL | 6 refills | Status: DC

## 2017-06-17 MED ORDER — ATORVASTATIN CALCIUM 40 MG PO TABS: 40.0000 mg | ORAL_TABLET | Freq: Every day | ORAL | 6 refills | Status: DC

## 2017-06-17 MED FILL — Midazolam HCl Inj 5 MG/5ML (Base Equivalent): INTRAMUSCULAR | Qty: 5 | Status: AC

## 2017-06-17 NOTE — Telephone Encounter (Signed)
Per Tommye Standard, PA-c this pt is being released from the hospital today and that he needs financial help with his Xarelto. I have advised Renee that Arlester Marker, RN and myself  make most all of the TCM calls from this office so we will be contact him anyway and that we can discuss at that time.  Renee verbalized understanding and thanked me for my help with this matter.

## 2017-06-17 NOTE — Clinical Social Work Note (Signed)
Clinical Social Work Assessment  Patient Details  Name: Nathan [name]. MRN: [id number] Date of Birth: [birth year]  Date of referral:  06/17/17               Reason for consult:  Transportation                Permission sought to share information with:    Permission granted to share information::     Name::        Agency::     Relationship::     Contact Information:     Housing/Transportation Living arrangements for the past 2 months:  Single Family Home Source of Information:  Patient Patient Interpreter Needed:  None Criminal Activity/Legal Involvement Pertinent to Current Situation/Hospitalization:  No - Comment as needed Significant Relationships:  Adult Children Lives with:  Self Do you feel safe going back to the place where you live?  Yes Need for family participation in patient care:  No (Coment)  Care giving concerns: Patient from home independently with fiance in [place]. Transferred to [employer] [facility] from [facility]. CSW consulted for transportation.   Social Worker assessment / plan: Patient is discharged today and does not have transportation home. CSW met with patient at bedside. Patient reports he lives with his fiance who does not drive. Patient and fiance do not have money to pay for a cab back home. Patient does not have any friends or family who can pick him up. CSW supervisor approved cab voucher for patient back home to [place]. CSW provided voucher and informed RN. Signing off as no additional needs identified.  Employment status:  Retired Astronomer) PT Recommendations:  Not assessed at this time Information / Referral to community resources:  Other (Comment Required)(taxi)  Patient/Family's Response to care: Not discussed.  Patient/Family's Understanding of and Emotional Response to Diagnosis, Current Treatment, and Prognosis: Not discussed.  Emotional Assessment Appearance:  Appears stated age Attitude/Demeanor/Rapport:   Engaged Affect (typically observed):  Calm, Pleasant Orientation:  Oriented to Self, Oriented to Place, Oriented to  Time, Oriented to Situation Alcohol / Substance use:  Not Applicable Psych involvement (Current and /or in the community):  No (Comment)  Discharge Needs  Concerns to be addressed:  Discharge Planning Concerns Readmission within the last 30 days:  No Current discharge risk:  Other(no transportation) Barriers to Discharge:  No Barriers Identified   Estanislado Emms, LCSW 06/17/2017, 11:23 AM

## 2017-06-17 NOTE — Discharge Instructions (Signed)
Post procedure care instructions No driving for 4 days. No lifting over 5 lbs for 1 week. No vigorous or sexual activity for 1 week. You may return to work on 06/23/17. Keep procedure site clean & dry. If you notice increased pain, swelling, bleeding or pus, call/return!  You may shower, but no soaking baths/hot tubs/pools for 1 week.   Call Cardiology Office around April 20th to see if you can get samples of Xarelto (Blood Thinner).  [phone]

## 2017-06-17 NOTE — Care Management Note (Addendum)
Case Management Note  Patient Details  Name: Nathan [name]. MRN: [id number] Date of Birth: [birth year]  Subjective/Objective:  Pt presented as a transfer from [facility]. Pt is on Xarelto and co pay came back at $142.00. CM did call [employer] to provide them with the 30 day free card- pt has utilized in the past and will not be able to use again. CM will make pt aware that she will not be able to assist with Medications at this time. Pharmacy called wife with cost of medications.                  Action/Plan: CSW did provide pt with transportation home via CAB. No further needs needs from CM at this time.   Expected Discharge Date:  06/17/17               Expected Discharge Plan:  Home/Self Care  In-House Referral:  Clinical Social Work  Discharge planning Services  CM Consult, Medication Assistance  Post Acute Care Choice:  NA Choice offered to:  NA  DME Arranged:  N/A DME Agency:  NA  HH Arranged:  NA HH Agency:  NA  Status of Service:  Completed, signed off  If discussed at [employer] of Stay Meetings, dates discussed:    Additional Comments: 1226 06-17-17 Jacqlyn Krauss, RN,BSN [phone]  CM did speak to PA- plan for home on Xarelto- PA to get samples of medication from office and bring to office. Office to assist with Patient Assistance after hospitalization. No further needs from CM at this time.  Bethena Roys, RN 06/17/2017, 11:44 AM

## 2017-06-17 NOTE — Telephone Encounter (Signed)
**Note De-Identified Kahla Risdon Obfuscation** I have done a Xarelto PA through covermymeds. Awaiting response.

## 2017-06-17 NOTE — Discharge Summary (Addendum)
DISCHARGE SUMMARY    Patient ID: Nathan [name].,  MRN: [id number], DOB/AGE: 10/13/1950 67 y.o.  Admit date: 06/15/2017 Discharge date: 06/17/2017  Primary Care Physician: Patient, [name]  Primary Cardiologist: Dr. Rockey Situ Electrophysiologist: new this admission to Dr. Lovena Le  Primary Discharge Diagnosis:  1. AFlutter w/RVR 2. Acute/chronic CHF  Secondary Discharge Diagnosis:  1. CM (mixed systolic/diastolic) 2. HTN 3. Smoker      counseled 4. Medical non-compliance     counseled  No Known Allergies   Procedures This Admission:  1. 06/16/17, TEE, Dr. Meda Coffee 2. 06/16/17: EPS/ablation, Dr. Lovena Le      CONCLUSIONS:  1. Isthmus-dependent right atrial flutter upon presentation.  2. Successful radiofrequency ablation of atrial flutter along the cavotricuspid isthmus with complete bidirectional isthmus block achieved.  3. No inducible arrhythmias following ablation except for spontaneous non-sustained atrial tachycardia..  4. No early apparent complications.   Brief HPI: Nathan Swindell. is a 67 y.o. male was initially admitted to [facility] with palpitations, SOB, found in AFlutter w/RVR, and acute/chronic CHF.  Hospital Course:  The patient was admitted 06/13/17, with increasing DOE/SOB and palpitations, was found again in AFlutter RVR.  He reported taking all meds EXCEPT the xarelto that was too expensive.  He was admitted, started on IV lasix for fluid OL, heparin gtt, and BB adjusted, in effort to avoid CCB given CHF.  He was transferred to [facility] for EP evaluation, TEE/possible EPS/ablation of his AFlutter.  He had hx of prior to this admission, his first consulted by cardiology in Feb during a hospital stay with new on set AFlutter/RVR without prior cardiac history known for him.  He was started on a/c and rate control, treated for acute CHF/diuresis, echo noted LVEF 20-25% (done while in rapid flutter), had TEE/DCCV >> SR.  It was felt he would need [facility] though given post DCCV  planned after a month of uninterrupted a/c.  Noted during the stay the patient decllined medicines on occasion and persistently wanted to leave the hospital prior to recommended discharge timing.  At his f/u out patient with Dr. Karl Bales only a week or 2 afterwards remained in SR, referred to EP though was a no-show to that appt.  He underwent 06/16/17 TEE that noted LVEF 20-25%, diffuse hypokinesis, mod-severe TR, RVSP 45, no LA/LAA thrombus, followed by EPS/ablation procedure.  He was monitored on telemetry noting SR 60's with intermittent PATs of short/self limited duration.  He will be started on amiodarone.  We discussed at length the importance of his medicines and taking them as prescribed.  We have asked case management to aid with Xarelto to get him through this 1st month at least, he will be contacted after discharge by one of our staff members for any financial assistance options for him as well.  We will resume his home meds., plan BMET in one week.  The patient diuresed well, 11 pounds, the day of discharge he is without any ongoing symptoms, SOB/DOE.  No CP, no site discomfort.  We will resume his BB/ACE, prior home meds.  Post procedure follow up has been arranged.  Ste care and activity instructions were reviewed with the patient.   The patient was examined by Dr. Lovena Le and considered stable for discharge to home.   Physical Exam: Vitals:   06/16/17 1840 06/16/17 1845 06/16/17 2001 06/17/17 0626  BP: 116/75 114/82 112/82 112/86  Pulse: (!) 58 63    Resp: (!) 25 14    Temp:  97.8 F (36.6 C) 97.6 F (36.4 C)  TempSrc:   Oral Oral  SpO2: 100% 99% 99% 97%  Weight:    224 lb 4.8 oz (101.7 kg)  Height:        GEN- The patient is well appearing, alert and oriented x 3 today.   HEENT: normocephalic, atraumatic; sclera clear, conjunctiva pink; hearing intact; oropharynx clear R IJ site is stable, no bleeding or hematoma Lungs- CTA b/l, normal work of breathing.  No wheezes, rales,  rhonchi Heart- RRR, no murmurs, rubs or gallops, PMI not laterally displaced GI- soft, non-tender, non-distended Extremities- no clubbing, cyanosis, or edema; DP/PTpulses 2+ bilaterally, R groin site is stable without bleeding or hematoma MS- no significant deformity or atrophy Skin- warm and dry, no rash or lesion Psych- euthymic mood, full affect Neuro- no gross defecits  Labs:   Lab Results  Component Value Date   WBC 6.9 06/17/2017   HGB 14.9 06/17/2017   HCT 45.5 06/17/2017   MCV 89.9 06/17/2017   PLT 289 06/17/2017    Recent Labs  Lab 06/16/17 0441  NA 142  K 3.4*  CL 103  CO2 26  BUN 35*  CREATININE 1.67*  CALCIUM 9.3  GLUCOSE 85    Discharge Medications:  Allergies as of 06/17/2017   No Known Allergies     Medication List    TAKE these medications   amiodarone 200 MG tablet Commonly known as:  PACERONE Take 1 tablet (200 mg total) by mouth daily.   atorvastatin 40 MG tablet Commonly known as:  LIPITOR Take 1 tablet (40 mg total) by mouth daily at 6 PM.   furosemide 40 MG tablet Commonly known as:  LASIX Take 1 tablet (40 mg total) by mouth daily.   lisinopril 20 MG tablet Commonly known as:  PRINIVIL,ZESTRIL Take 1 tablet (20 mg total) by mouth daily.   metoprolol succinate 50 MG 24 hr tablet Commonly known as:  TOPROL-XL Take 1 tablet (50 mg total) by mouth daily. Take with or immediately following a meal.   rivaroxaban 20 MG Tabs tablet Commonly known as:  XARELTO Take 1 tablet (20 mg total) by mouth daily with supper. What changed:  when to take this       Disposition:  Discharge Instructions    Diet - low sodium heart healthy   Complete by:  As directed    Increase activity slowly   Complete by:  As directed      Follow-up Information    [name], MD Follow up on 07/14/2017.   Specialty:  Cardiology Why:  4:30PM Contact information: [address] [place] [address] [phone]        [name], MD Follow up on 08/26/2017.   Specialty:  Cardiology Why:  3:40PM Contact information: [place] [address] [phone]        [place] Follow up on 06/24/2017.   Specialty:  Cardiology Why:  9:00AM Contact information: [address] [place] [phone]          Duration of Discharge Encounter: Greater than 30 minutes including physician time.  Venetia Night, PA-C 06/17/2017 12:48 PM  EP Attending  Patient seen and examined. Agree with the findings as noted above. The patient is doing well after EP study and ablation of atrial flutter. He still has some non--sustained atrial tachy which is asymptomatic. I have recommended he be  discharged home on low dose amiodarone. His thyroid function has recently been stable but will need to be followed. I would anticipate about 3 months of amiodarone and then stopping it. He will need to be discharged on appropriate CHF medical therapy as above.  Mikle Bosworth.D.

## 2017-06-23 NOTE — Telephone Encounter (Addendum)
I received a denial letter on the pts Xarelto tier exception (I did a PA but a PA was not needed so it reverted to a tier exception). Reason: Under medicare Part D brand name Xarelto is already at the lowest possible co-pay level for a brand name drug, Therefore, we cannot cover your medication at a lower cost.  I called the pts pharmacy and was advised that the pt still owes $95 towards his deductible. His Xarelto will cost him $142 ($95 for remaining deductible and $47 for RX) one time only for the year then will reduce to $47 for a 30 day supply.   I called the pt and he states that even if he pays the deductible he still cannot afford $47 a month for Xarelto. He is advised that I am going to appeal this denial and that it is unlikely that we will get an approval.   He is advised that I am sending this message to Dr Hettie Holstein, PA-c and their nurses for advisement.  Pt request that he is called back after 3 pm as he is working.

## 2017-06-24 ENCOUNTER — Other Ambulatory Visit: Payer: Medicare HMO

## 2017-06-24 ENCOUNTER — Ambulatory Visit: Payer: Medicare HMO | Admitting: Family

## 2017-06-24 NOTE — Telephone Encounter (Signed)
Can we provide him with Xarelto 20 mg samples Need to get him through at least one month  he just had ablation

## 2017-06-24 NOTE — Telephone Encounter (Signed)
We received a denial on this appeal. Will forward to Dr Lovena Le, Tommye Standard, PA-c and their nurses for advisement as the pt states that he cannot afford Xarelto.

## 2017-06-25 ENCOUNTER — Telehealth: Payer: Self-pay | Admitting: Internal Medicine

## 2017-06-25 NOTE — Telephone Encounter (Signed)
[facility]

## 2017-06-25 NOTE — Telephone Encounter (Signed)
Attempted to reach patient to discuss one month of samples that I have for him to pick up and also regarding orders for lab work to be done which he wants to be done at [facility]. Left voicemail message for patient to call back so that we can discuss this information.  Medication Samples have been provided to the patient.  Drug name: Xarelto       Strength: 20 mg        Qty: 4 bottles  LOT: [id number]  Exp.Date: 3/21  Samples placed in bag and up front for patient to pick up at his convenience. Order also entered for patient to have labs done at [facility] Entrance of the hospital as directed by provider.

## 2017-06-25 NOTE — Telephone Encounter (Signed)
See other telephone note regarding samples and lab testing.

## 2017-06-26 NOTE — Telephone Encounter (Signed)
I called and spoke with the patient. He is aware that Dr. Donivan Scull nurse has pulled Xarelto samples for him to pick up at our front desk.  He is aware of his follow up appointments with Darylene Price, NP in the CHF clinic. He had an appointment with Dr. Lovena Le on 4/29, but does not have transportation to [place]. He is aware I have rescheduled his EP follow up to Dr. [name] in the [place] office on 07/10/17 at 11:30am. He has to arrange transportation through [place]. He will try to call them tomorrow to confirm.  He is aware he needs to start Xarelto ASAP. He states he will try to come by on Monday or Tuesday next week. He is aware we will recheck his BMP at his follow up with Dr. [name]. He is agreeable.

## 2017-07-01 ENCOUNTER — Telehealth: Payer: Self-pay | Admitting: Cardiovascular Disease

## 2017-07-01 NOTE — Telephone Encounter (Signed)
° °  Pt c/o swelling: STAT is pt has developed SOB within 24 hours  1) How much weight have you gained and in what time span? unknown  2) If swelling, where is the swelling located? Right Leg below knee to ankle Intermittent   3) Are you currently taking a fluid pill? Unknown   4) Are you currently SOB? Denies   5) Do you have a log of your daily weights (if so, list)? Unknown   6) Have you gained 3 pounds in a day or 5 pounds in a week? umknown  7) Have you traveled recently? No   Patient had cath x 2 weeks ago denies other symptoms

## 2017-07-01 NOTE — Telephone Encounter (Signed)
Called patient. He's been having intermittent swelling in his right leg from the knee to ankle. It gets better at night with elevation. Denies redness, warmth, or pain. Denies any weight gain saying he weighs 230 lb today which is his normal. States he takes all his medications everyday as prescribed including furosemide 40 mg daily. Advised patient to elevate and wear compression socks and he verbalized understanding.  Next appointment is 4/24 with Darylene Price and 4/25 with Dr [name]. Routing to Dr Rockey Situ to review.

## 2017-07-01 NOTE — Telephone Encounter (Signed)
Error

## 2017-07-01 NOTE — Discharge Summary (Signed)
Ahmod Gillespie., is a 67 y.o. male  DOB August 21, 1950  MRN [id number].  Admission date:  06/12/2017  Admitting Physician  Demetrios Loll, MD  Discharge Date:  07/01/2017   Primary MD  Patient, [name]  Recommendations for primary care physician for things to follow:  Being transferred to [facility] for[facility]rial ablation for uncontrolled atrial flutter   Admission Diagnosis  Sinus tachycardia [R00.0] Exertional dyspnea [R06.09] Acute renal insufficiency [N28.9] Elevated troponin [R74.8]   Discharge Diagnosis  Sinus tachycardia [R00.0] Exertional dyspnea [R06.09] Acute renal insufficiency [N28.9] Elevated troponin [R74.8]    Active Problems:   A-fib (HCC)   Atrial flutter (HCC)      Past Medical History:  Diagnosis Date  . Atrial flutter ([place])    a. s/p TEE/DCCV 04/2017; b. CHADS2VASc => 3 (CHF, HTN, age x 1); c. not compliant with Xarelto  . Chronic combined systolic and diastolic CHF (congestive heart failure) ([place])    a. TTE 2/19: EF 20-25%, diffuse HK, mod dilated LA, mildly reduced RVSF, PASP 42; b. TTE 3/19: EF 20-25%, diffuse HK, RVSF nl, atrial flutter with RVR  . Essential hypertension   . Pulmonary hypertension ([place])     Past Surgical History:  Procedure Laterality Date  . A-FLUTTER ABLATION N/A 06/16/2017   Procedure: A-FLUTTER ABLATION;  Surgeon: [name], MD;  Location: [facility];  Service: Cardiovascular;  Laterality: N/A;  . CARDIOVERSION N/A 05/01/2017   Procedure: CARDIOVERSION;  Surgeon: [name], MD;  Location: [facility] ORS;  Service: Cardiovascular;  Laterality: N/A;  . TEE WITHOUT CARDIOVERSION N/A 05/01/2017   Procedure: TRANSESOPHAGEAL ECHOCARDIOGRAM (TEE);  Surgeon: [name], MD;  Location: [facility] ORS;  Service: Cardiovascular;  Laterality: N/A;  . TEE WITHOUT CARDIOVERSION  N/A 06/16/2017   Procedure: TRANSESOPHAGEAL ECHOCARDIOGRAM (TEE);  Surgeon: [name], MD;  Location: [facility] ENDOSCOPY;  Service: Cardiovascular;  Laterality: N/A;       History of present illness and  Hospital Course:     Kindly see H&P for history of present illness and admission details, please review complete Labs, Consult reports and Test reports for all details in brief  HPI  from the history and physical done on the day of admission 67 year old male patient with history of atrial flutter, hypertension, systolic heart failure with EF of 20-25%, history of cardioversion February 2019, noncompliance with anticoagulation, noncompliant with appointments with PCP physician came in because of shortness of breath, palpitation and found to have atrial fibrillation with RVR, congestive heart failure flareup.   Hospital Course  #1 acute on chronic systolic heart failure with EF of 20%, patient admitted to telemetry, continue to receive IV Lasix, patient shortness of breath improving, because of uncontrolled atrial flutter patient is being transferred to [facility] today ablation. 2.  Severe hypokalemia secondary to diuretics: Potassium supplements. 3.  Atrial flutter with uncontrolled heart rate: Patient is being transferred to [facility] for ablation on Monday.  Patient is on heparin drip.  In the hospital.  Patient received a high-dose beta-blocker, cardiac calcium blocker but because of persistent tachycardia with heart rate up to 120 bpm patient being transferred to [facility] for atrial ablation.     Discharge Condition: Stable   Follow UP      Discharge Instructions  and  Discharge Medications      Allergies as of 06/15/2017   No Known Allergies     Medication List    You have not been prescribed any medications.  Continue Lasix as per [facility] and continue heparin drip as per MAR   diet and Activity recommendation: See Discharge Instructions above   Consults  obtained -cardiology   Major procedures and Radiology Reports - PLEASE review detailed and final reports for all details, in brief -      Dg Chest 2 View  Result Date: 06/12/2017 CLINICAL DATA:  Shortness of breath and fatigue EXAM: CHEST - 2 VIEW COMPARISON:  April 29, 2017 FINDINGS: There is no appreciable edema or consolidation. There is central interstitial prominence. Heart is slightly enlarged with pulmonary vascularity within normal limits. No adenopathy. No bone lesions. IMPRESSION: Central bronchitis. No edema or consolidation. Mild cardiac enlargement. Electronically Signed   By: Lowella Grip III M.D.   On: 06/12/2017 19:05    Micro Results     No results found for this or any previous visit (from the past 240 hour(s)).     Today   Subjective:   Woody Kronberg today for transfer to [facility].  Objective:   Blood pressure (!) 131/102, pulse (!) 122, temperature 98.1 F (36.7 C), resp. rate 18, height 6\' 5"  (1.956 m), weight 103.3 kg (227 lb 11.2 oz), SpO2 96 %.  No intake or output data in the 24 hours ending 07/01/17 1925  Exam Awake Alert, Oriented x 3, No new F.N deficits, Normal affect [place].AT,PERRAL Supple Neck,No JVD, No cervical lymphadenopathy appriciated.  Symmetrical Chest wall movement, Good air movement bilaterally, CTAB RRR,No Gallops,Rubs or new Murmurs, No Parasternal Heave +ve B.Sounds, Abd Soft, Non tender, No organomegaly appriciated, No rebound -guarding or rigidity. No Cyanosis, Clubbing or edema, No new Rash or bruise  Data Review   CBC w Diff:  Lab Results  Component Value Date   WBC 6.9 06/17/2017   HGB 14.9 06/17/2017   HCT 45.5 06/17/2017   PLT 289 06/17/2017   LYMPHOPCT 35 04/29/2017   MONOPCT 17 04/29/2017   EOSPCT 1 04/29/2017   BASOPCT 1 04/29/2017    CMP:  Lab Results  Component Value Date   NA 142 06/16/2017   K 3.4 (L) 06/16/2017   CL 103 06/16/2017   CO2 26 06/16/2017   BUN 35 (H) 06/16/2017   CREATININE  1.67 (H) 06/16/2017   PROT 7.4 04/29/2017   ALBUMIN 3.5 04/29/2017   BILITOT 0.8 04/29/2017   ALKPHOS 87 04/29/2017   AST 45 (H) 04/29/2017   ALT 61 04/29/2017  .   Total Time in preparing paper work, data evaluation and todays exam - 35 minutes  Epifanio Lesches M.D on 06/15/2017 at 7:25 PM    Note: This dictation was prepared with Dragon dictation along with smaller phrase technology. Any transcriptional errors that result from this process are unintentional.

## 2017-07-02 NOTE — Telephone Encounter (Signed)
Patient has transportation issues and has to give the service plenty of notice for an appointment. Patient cannot come today or tomorrow. He is going to see if he can come on Friday, 4/19 at 11:40 am to see Dr Rockey Situ. He's going to call them and then call us back to let us know for sure.

## 2017-07-02 NOTE — Telephone Encounter (Signed)
S/w patient again. Due to transportation issues, patient cannot make appointment on Friday. He is already set up with transport for 07/09/17 with [facility]. Patient states there's no way he can get to an earlier appointment. He does not have anyone else who can bring him either. Will route to Otila Kluver to make her aware since patient will be seeing her.

## 2017-07-02 NOTE — Telephone Encounter (Signed)
Etiology unclear, He could come in for nurse visit, If significant swelling, might need venous doppler to rule out DVT If very mild, could be secondary to CHF in setting of atrial flutter If unable to come in today for nurse visit, would add on my schedule tomorrow, thursday

## 2017-07-02 NOTE — Telephone Encounter (Signed)
Patient says he cannot make this appt His transportation will be closed on 4/19 Will need to be rescheduled  Please advise

## 2017-07-04 ENCOUNTER — Ambulatory Visit: Payer: Medicare HMO | Admitting: Cardiovascular Disease

## 2017-07-07 ENCOUNTER — Telehealth: Payer: Self-pay | Admitting: Cardiovascular Disease

## 2017-07-07 NOTE — Telephone Encounter (Signed)
°*  STAT* If patient is at the pharmacy, call can be transferred to refill team.   1. Which medications need to be refilled? (please list name of each medication and dose if known) atorvastatin (LIPITOR)  2. Which pharmacy/location (including street and city if local pharmacy) is medication to be sent to? [facility] on [address]  3. Do they need a 30 day or 90 day supply? 30 day

## 2017-07-07 NOTE — Telephone Encounter (Signed)
Please advise if ok to refill Atorvastatin tablet.

## 2017-07-07 NOTE — Telephone Encounter (Signed)
Atorvastatin 40mg  QD #30 R: 6 sent to [place], [place] on 06/17/17 by Tommye Standard, PA-C. Notified patient that he has refills and should contact the pharmacy. Patient verbalized understanding.

## 2017-07-08 NOTE — Telephone Encounter (Signed)
I called and spoke with the pharmacy regarding the patient's lipitor RX.  Per the pharmacist, the patient's RX had a "hold" on it for some reason, but the hold has been taken off and the RX has been filled. Confirmed the patient can pick up this today.  I have notified the patient that his lipitor RX has been filled and is ready for pick up.  He states his daughter works at [employer] and will pick up his RX when her shift is over today.

## 2017-07-08 NOTE — Progress Notes (Signed)
Patient ID: Nathan Pelc., male    DOB: 1950-12-04, 67 y.o.   MRN: [id number]  HPI  Nathan [name] is a 67 y/o male with a history of HTN, current tobacco use and chronic heart failure.   Echo report from 06/16/17 reviewed and showed an EF of 15-20% along with mod-severe TR and an elevated PA pressure of 45 mm Hg.  Admitted 06/15/17 due to a. Flutter with RVR along with HF exacerbation. Had ablation done 06/16/17. Initially given IV lasix along with heparin drip and then transitioned to oral medications. Discharged after 2 days. Admitted 06/12/17 due to HF and a. Flutter with RVR. Given IV lasix and transferred to [facility] after 3 days.   He presents today for his initial visit with a chief complaint of minimal fatigue upon moderate exertion. Says that he's been tired for a few months and was feeling better but now feels like he's getting more tired again. Has associated shortness of breath and chest tightness along with this. He denies any difficulty sleeping, abdominal distention, edema, chest pain, palpitations, dizziness or weight gain. Says that he's taking 4 medications but doesn't know the names of the medicine. Called [facility] and they say that he has 5 active prescriptions with a hold on the metoprolol succinate.   Past Medical History:  Diagnosis Date  . Atrial flutter ([place])    a. s/p TEE/DCCV 04/2017; b. CHADS2VASc => 3 (CHF, HTN, age x 1); c. not compliant with Xarelto  . Chronic combined systolic and diastolic CHF (congestive heart failure) ([place])    a. TTE 2/19: EF 20-25%, diffuse HK, mod dilated LA, mildly reduced RVSF, PASP 42; b. TTE 3/19: EF 20-25%, diffuse HK, RVSF nl, atrial flutter with RVR  . Essential hypertension   . Pulmonary hypertension ([place])    Past Surgical History:  Procedure Laterality Date  . A-FLUTTER ABLATION N/A 06/16/2017   Procedure: A-FLUTTER ABLATION;  Surgeon: [name], MD;  Location: [place] [facility];  Service: Cardiovascular;  Laterality: N/A;   . CARDIOVERSION N/A 05/01/2017   Procedure: CARDIOVERSION;  Surgeon: [name], MD;  Location: [facility] ORS;  Service: Cardiovascular;  Laterality: N/A;  . TEE WITHOUT CARDIOVERSION N/A 05/01/2017   Procedure: TRANSESOPHAGEAL ECHOCARDIOGRAM (TEE);  Surgeon: [name], MD;  Location: [facility] ORS;  Service: Cardiovascular;  Laterality: N/A;  . TEE WITHOUT CARDIOVERSION N/A 06/16/2017   Procedure: TRANSESOPHAGEAL ECHOCARDIOGRAM (TEE);  Surgeon: [name], MD;  Location: [facility] ENDOSCOPY;  Service: Cardiovascular;  Laterality: N/A;   Family History  Problem Relation Age of Onset  . Alzheimer's disease Mother   . Alzheimer's disease Father    Social History   Tobacco Use  . Smoking status: Former Research scientist (life sciences)  . Smokeless tobacco: Never Used  Substance Use Topics  . Alcohol use: Yes    Alcohol/week: 1.8 oz    Types: 3 Cans of beer per week    Frequency: Never    Comment: Drink a half of a 40oz beer every day, drank heavily in the past   No Known Allergies Prior to Admission medications   Medication Sig Start Date End Date Taking? Authorizing Provider  amiodarone (PACERONE) 200 MG tablet Take 1 tablet (200 mg total) by mouth daily. 06/17/17  Yes Baldwin Jamaica, PA-C  atorvastatin (LIPITOR) 40 MG tablet Take 1 tablet (40 mg total) by mouth daily at 6 PM. 06/17/17  Yes Baldwin Jamaica, PA-C  furosemide (LASIX) 40 MG tablet Take 1 tablet (40 mg  total) by mouth daily. 06/17/17 12/14/17 Yes Baldwin Jamaica, PA-C  rivaroxaban (XARELTO) 20 MG TABS tablet Take 1 tablet (20 mg total) by mouth daily with supper. 06/17/17  Yes Baldwin Jamaica, PA-C  lisinopril (PRINIVIL,ZESTRIL) 20 MG tablet Take 1 tablet (20 mg total) by mouth daily. Patient not taking: Reported on 07/09/2017 06/17/17 12/14/17  Baldwin Jamaica, PA-C  metoprolol succinate (TOPROL-XL) 50 MG 24 hr tablet Take 1 tablet (50 mg total) by mouth daily. Take with or immediately following a meal. Patient not taking: Reported on 07/09/2017  06/17/17   Baldwin Jamaica, PA-C    Review of Systems  Constitutional: Positive for fatigue. Negative for appetite change.  HENT: Negative for congestion, postnasal drip and sore throat.   Eyes: Negative.   Respiratory: Positive for chest tightness and shortness of breath.   Cardiovascular: Negative for chest pain, palpitations and leg swelling.  Gastrointestinal: Negative for abdominal distention and abdominal pain.  Endocrine: Negative.   Genitourinary: Negative.   Musculoskeletal: Negative for back pain and neck pain.  Skin: Negative.   Allergic/Immunologic: Negative.   Neurological: Negative for dizziness and light-headedness.  Hematological: Negative for adenopathy. Does not bruise/bleed easily.  Psychiatric/Behavioral: Negative for dysphoric mood and sleep disturbance (sleeping on 2 pillows). The patient is not nervous/anxious.    Vitals:   07/09/17 0850  BP: (!) 162/128  Pulse: 97  Resp: 18  SpO2: 99%  Weight: 234 lb 6 oz (106.3 kg)  Height: 6\' 5"  (1.956 m)   Wt Readings from Last 3 Encounters:  07/09/17 234 lb 6 oz (106.3 kg)  06/17/17 224 lb 4.8 oz (101.7 kg)  06/15/17 227 lb 11.2 oz (103.3 kg)   Lab Results  Component Value Date   CREATININE 1.67 (H) 06/16/2017   CREATININE 1.44 (H) 06/15/2017   CREATININE 1.54 (H) 06/14/2017   Physical Exam  Constitutional: He is oriented to person, place, and time. He appears well-developed and well-nourished.  HENT:  Head: Normocephalic and atraumatic.  Neck: Normal range of motion. Neck supple. No JVD present.  Cardiovascular: An irregular rhythm present. Tachycardia present.  Pulmonary/Chest: Effort normal. No respiratory distress. He has wheezes in the right lower field and the left lower field. He has no rales.  Abdominal: Soft. He exhibits no distension. There is no tenderness.  Musculoskeletal: He exhibits no edema or tenderness.  Neurological: He is alert and oriented to person, place, and time.  Skin: Skin is warm  and dry.  Psychiatric: He has a normal mood and affect. His behavior is normal. Thought content normal.  Nursing note and vitals reviewed.   Assessment & Plan:  1: Chronic heart failure with reduced ejection fraction- - NYHA class II - euvolemic today - weighing daily and says that his weight has been stable. Discussed the importance of calling for any overnight weight gain of >2 pounds or a weekly weight gain of >5 pounds - not adding salt to his food and tries to eat low sodium foods. Reviewed the importance of closely following a 2000mg  sodium diet and written dietary information was given to him about this - continues to work 3 days a week at [employer] but says that he usually eats a salad or tomato sandwich - consider changing his lisinopril (if he's taking it) to entresto - consider titrating up his metoprolol succinate (if he's taking it) - saw cardiology Rockey Situ) 05/09/17 & returns 08/26/17 - BNP on 06/12/17 was 1839.0  2: HTN- - BP elevated today but he's  not sure what medications he's taking and what he's out of  - BMP from 06/16/17 reviewed and showed sodium 142, potassium 3.4 and GFR 48  3: Tobacco use-  - quit smoking 04/21/17 - continue cessation discussed for 3 minutes with him  4: Atrial flutter- - had an ablation done 06/16/17 - he thinks he's taking xarelto but isn't sure about amiodarone - sees EP [name]) 07/10/17  Patient did not bring his medications nor a list. Each medication was verbally reviewed with the patient and he was encouraged to bring the bottles to every visit to confirm accuracy of list.  Patient instructed to call me back when he gets home to read medication bottles to me so that we can adjust medications accordingly.  Return in 1 month or sooner for any questions/problems before then.

## 2017-07-08 NOTE — Telephone Encounter (Signed)
Patient calling back to say that the pharmacy says the medication is on hold He would like to speak with nurse  Please call to discuss

## 2017-07-09 ENCOUNTER — Ambulatory Visit: Payer: Medicare HMO | Attending: Family | Admitting: Family

## 2017-07-09 ENCOUNTER — Encounter: Payer: Self-pay | Admitting: Family

## 2017-07-09 ENCOUNTER — Telehealth: Payer: Self-pay | Admitting: Family

## 2017-07-09 ENCOUNTER — Other Ambulatory Visit: Payer: Self-pay | Admitting: Family

## 2017-07-09 VITALS — BP 162/128 | HR 97 | Resp 18 | Ht 77.0 in | Wt 234.4 lb

## 2017-07-09 DIAGNOSIS — Z7901 Long term (current) use of anticoagulants: Secondary | ICD-10-CM | POA: Diagnosis not present

## 2017-07-09 DIAGNOSIS — I110 Hypertensive heart disease with heart failure: Secondary | ICD-10-CM | POA: Diagnosis not present

## 2017-07-09 DIAGNOSIS — I272 Pulmonary hypertension, unspecified: Secondary | ICD-10-CM | POA: Diagnosis not present

## 2017-07-09 DIAGNOSIS — I5022 Chronic systolic (congestive) heart failure: Secondary | ICD-10-CM | POA: Diagnosis not present

## 2017-07-09 DIAGNOSIS — Z720 Tobacco use: Secondary | ICD-10-CM | POA: Insufficient documentation

## 2017-07-09 DIAGNOSIS — Z9889 Other specified postprocedural states: Secondary | ICD-10-CM | POA: Insufficient documentation

## 2017-07-09 DIAGNOSIS — I4892 Unspecified atrial flutter: Secondary | ICD-10-CM

## 2017-07-09 DIAGNOSIS — Z820 Family history of epilepsy and other diseases of the nervous system: Secondary | ICD-10-CM | POA: Insufficient documentation

## 2017-07-09 DIAGNOSIS — I10 Essential (primary) hypertension: Secondary | ICD-10-CM | POA: Insufficient documentation

## 2017-07-09 DIAGNOSIS — Z87891 Personal history of nicotine dependence: Secondary | ICD-10-CM | POA: Insufficient documentation

## 2017-07-09 DIAGNOSIS — Z79899 Other long term (current) drug therapy: Secondary | ICD-10-CM | POA: Diagnosis not present

## 2017-07-09 MED ORDER — METOPROLOL SUCCINATE ER 100 MG PO TB24: 100.0000 mg | ORAL_TABLET | Freq: Every day | ORAL | 3 refills | Status: DC

## 2017-07-09 NOTE — Patient Instructions (Addendum)
Continue weighing daily and call for an overnight weight gain of > 2 pounds or a weekly weight gain of >5 pounds.  Call me when you get home to let me know exactly what you are taking.

## 2017-07-09 NOTE — Telephone Encounter (Signed)
Spoke with patient about his medications. He says that he's currently taking atorvastatin, furosemide, lisinopril and xarelto.  Will add metoprolol succinate 100mg  daily. He sees Dr. [name] tomorrow so will defer the re-initiation of amiodarone to Dr. [name].

## 2017-07-10 ENCOUNTER — Ambulatory Visit: Payer: Medicare HMO | Admitting: Internal Medicine

## 2017-07-10 ENCOUNTER — Other Ambulatory Visit
Admission: RE | Admit: 2017-07-10 | Discharge: 2017-07-10 | Disposition: A | Discharge status: Home / Self Care | Payer: Medicare HMO | Source: Ambulatory Visit | Attending: Internal Medicine | Admitting: Internal Medicine

## 2017-07-10 ENCOUNTER — Encounter: Payer: Self-pay | Admitting: Internal Medicine

## 2017-07-10 VITALS — BP 140/100 | HR 174 | Ht 77.0 in | Wt 233.5 lb

## 2017-07-10 DIAGNOSIS — I272 Pulmonary hypertension, unspecified: Secondary | ICD-10-CM

## 2017-07-10 DIAGNOSIS — I5022 Chronic systolic (congestive) heart failure: Secondary | ICD-10-CM | POA: Diagnosis not present

## 2017-07-10 DIAGNOSIS — I255 Ischemic cardiomyopathy: Secondary | ICD-10-CM

## 2017-07-10 DIAGNOSIS — I4892 Unspecified atrial flutter: Secondary | ICD-10-CM

## 2017-07-10 DIAGNOSIS — Z79899 Other long term (current) drug therapy: Secondary | ICD-10-CM

## 2017-07-10 LAB — BASIC METABOLIC PANEL
Anion gap: 12 (ref 5–15)
BUN: 17 mg/dL (ref 6–20)
CHLORIDE: 103 mmol/L (ref 101–111)
CO2: 25 mmol/L (ref 22–32)
Calcium: 9 mg/dL (ref 8.9–10.3)
Creatinine, Ser: 1.26 mg/dL — ABNORMAL HIGH (ref 0.61–1.24)
GFR calc Af Amer: >60 mL/min (ref >60)
GFR calc non Af Amer: 58 mL/min — ABNORMAL LOW (ref >60)
GLUCOSE: 114 mg/dL — AB (ref 65–99)
Potassium: 3.2 mmol/L — ABNORMAL LOW (ref 3.5–5.1)
Sodium: 140 mmol/L (ref 135–145)

## 2017-07-10 LAB — MAGNESIUM: Magnesium: 1.6 mg/dL — ABNORMAL LOW (ref 1.7–2.4)

## 2017-07-10 MED ORDER — AMIODARONE HCL 200 MG PO TABS: ORAL_TABLET | ORAL | 0 refills | Status: DC

## 2017-07-10 MED ORDER — AMIODARONE HCL 200 MG PO TABS: 200.0000 mg | ORAL_TABLET | Freq: Every day | ORAL | 10 refills | Status: DC

## 2017-07-10 NOTE — Patient Instructions (Addendum)
Medication Instructions: - Your physician has recommended you make the following change in your medication:   1) START amiodarone 200 mg: - take 2 tablets (400 mg) by mouth TWICE daily x 2 weeks, then - take 2 tablets (400 mg) by mouth ONCE daily x 2 weeks, then - take 1 tablet (200 mg) by mouth ONCE daily  (please pick this up at the pharmacy today/ ASAP)  - take your metoprolol today  Labwork: - Your physician recommends that you have lab work today: BMP/ Magnesium  Procedures/Testing: - Your physician has recommended that you have a Cardioversion (DCCV). Electrical Cardioversion uses a jolt of electricity to your heart either through paddles or wired patches attached to your chest. This is a controlled, usually prescheduled, procedure. Defibrillation is done under light anesthesia in the hospital, and you usually go home the day of the procedure. This is done to get your heart back into a normal rhythm. You are not awake for the procedure.  - we will schedule this for Thursday 07/24/17- Dr. Olin Pia nurse, Nira Conn, will call you with the details of this procedure  Follow-Up: - Your physician recommends that you schedule a follow-up appointment in: 2 weeks- on Tuesday 07/22/17 at 11:40 am with Dr. Rockey Situ.   Any Additional Special Instructions Will Be Listed Below (If Applicable).     If you need a refill on your cardiac medications before your next appointment, please call your pharmacy.

## 2017-07-10 NOTE — Progress Notes (Signed)
Patient Care Team: Patient, [name] as PCP - General (General Practice)   HPI  Nathan [name]. is a 67 y.o. male Seen in follow-up for ablation for atrial flutter.  This was identified 3/19 when he presented with a rapid rate and congestive heart failure.  He was found to have an ejection fraction of 20-25%.  He underwent catheter ablation 4/19 (GT) and was discharged in sinus rhythm.  He comes back in today with increasing symptoms of shortness of breath and palpitations.  He is in atrial fibrillation with a rapid rate. He has had no edema    He is not on rate controlling meds   Date Cr K Mg  3//19 1.67 3.4 1.6  4/19 Today 1.26 3.2 1.6     Records and Results Reviewed   Past Medical History:  Diagnosis Date  . Atrial flutter ([place])    a. s/p TEE/DCCV 04/2017; b. CHADS2VASc => 3 (CHF, HTN, age x 1); c. not compliant with Xarelto  . Chronic combined systolic and diastolic CHF (congestive heart failure) ([place])    a. TTE 2/19: EF 20-25%, diffuse HK, mod dilated LA, mildly reduced RVSF, PASP 42; b. TTE 3/19: EF 20-25%, diffuse HK, RVSF nl, atrial flutter with RVR  . Essential hypertension   . Pulmonary hypertension ([place])     Past Surgical History:  Procedure Laterality Date  . A-FLUTTER ABLATION N/A 06/16/2017   Procedure: A-FLUTTER ABLATION;  Surgeon: [name], MD;  Location: [place] [facility];  Service: Cardiovascular;  Laterality: N/A;  . CARDIOVERSION N/A 05/01/2017   Procedure: CARDIOVERSION;  Surgeon: [name], MD;  Location: [facility] ORS;  Service: Cardiovascular;  Laterality: N/A;  . TEE WITHOUT CARDIOVERSION N/A 05/01/2017   Procedure: TRANSESOPHAGEAL ECHOCARDIOGRAM (TEE);  Surgeon: [name], MD;  Location: [facility] ORS;  Service: Cardiovascular;  Laterality: N/A;  . TEE WITHOUT CARDIOVERSION N/A 06/16/2017   Procedure: TRANSESOPHAGEAL ECHOCARDIOGRAM (TEE);  Surgeon: [name], MD;  Location: [facility] ENDOSCOPY;  Service: Cardiovascular;   Laterality: N/A;    Current Meds  Medication Sig  . atorvastatin (LIPITOR) 40 MG tablet Take 1 tablet (40 mg total) by mouth daily at 6 PM.  . furosemide (LASIX) 40 MG tablet Take 1 tablet (40 mg total) by mouth daily.  Marland Kitchen lisinopril (PRINIVIL,ZESTRIL) 20 MG tablet Take 1 tablet (20 mg total) by mouth daily.  . rivaroxaban (XARELTO) 20 MG TABS tablet Take 1 tablet (20 mg total) by mouth daily with supper.    No Known Allergies    Review of Systems negative except from HPI and PMH  Physical Exam BP (!) 140/100 (BP Location: Left Arm, Patient Position: Sitting, Cuff Size: Normal)   Pulse (!) 174   Ht 6\' 5"  (1.956 m)   Wt 233 lb 8 oz (105.9 kg)   BMI 27.69 kg/m  Well developed and nourished in no acute distress HENT normall Neck supple with JVP-8 Carotids brisk and full without bruits Clear  Irregularly irregular rate and rhythm with rapid ventricular response, no murmurs or gallops Abd-soft with active BS without hepatomegaly No Clubbing cyanosis tr edema Skin-warm and dry A & Oriented  Grossly normal sensory and motor function  ECG personally reviewed afib 176 17/09/30 degree  Assessment and  Plan  Atrial fib  NICM  CHF chronic systolic  Renal insuff Gr 3  Hypokalemia   The patient has persistent atrial fibrillation with a rapid rate.  In the context of his cardiomyopathy, the  latter is presumed to be rate related, control of rate and/or rhythm is essential.  We will resume his metoprolol 100 mg.  We will anticipate cardioversion in a couple of weeks.  Given the rapid reversion to atrial arrhythmia however, in the absence of antiarrhythmic therapy, we will use amiodarone concomitantly.  We will begin 400 mg twice daily for 2 weeks and then drop it down to 40 mg a day.  We will tentatively schedule cardioversion in about 2-1/2 weeks.     He is also been noted to be hypokalemic on a diuretic.  He is hypertensive.  We will recheck blood work today.  If hypokalemia  persists, will add Aldactone potentially beneficial also in the context of his cardiomyopathy.   I would have a low threshold for referring for catheter ablation.  We spent more than 50% of our >40 min visit in face to face counseling regarding the above    Current medicines are reviewed at length with the patient today .  The patient does not  have concerns regarding medicines.

## 2017-07-11 ENCOUNTER — Telehealth: Payer: Self-pay | Admitting: Cardiovascular Disease

## 2017-07-11 MED ORDER — SPIRONOLACTONE 25 MG PO TABS: 25.0000 mg | ORAL_TABLET | Freq: Every day | ORAL | 6 refills | Status: DC

## 2017-07-11 NOTE — Telephone Encounter (Signed)
I spoke with Dr. [name] regarding the patient's symptoms and lab results. Per Dr. [name], unlikely that the patient would have vomiting and diarrhea after 1-2 doses of amiodarone. The patient's renal function has improved, but his potassium is low at 3.2 and his magnesium is a little low at 1.6.   Per Dr. [name]-  - have the patient continue lasix for now - start aldactone 25 mg- 1 tablet once daily (since patient has had historically low potassium levels) - hold amiodarone over the weekend - if vomiting and diarrhea persists then he needs to go to the ER for further evaluation - if vomiting and diarrhea resolve, then will re-attempt amiodarone on Monday 4/29.   I spoke with the patient. He states that he left our office yesterday afternoon (around 1:45 or so) and ate lunch. He took metoprolol succ 100 mg x 1 dose around 4 pm yesterday. He started to have vomiting and diarrhea after that. He took amiodarone last night about 9 pm and had vomiting and diarrhea after that and during the night.   I advised the patient that his vomiting and diarrhea are probably not related to the two medications, but he is aware that per Dr. [name], he may: - hold amiodarone over the weekend - I have advised him that we need to try to slow his HR down and he does report that he felt like the metoprolol helped with that yesterday. I have asked that he take metoprolol succinate 100 mg- 1/2 tablet (50 mg) around lunch time today and see how he tolerates this. - he I aware of his lab results and that his potassium is low - he is agreeable with starting spironolactone 25 mg once daily - he is aware Dr. [name] will plan to stop lasix at some point, but would like to get spironolactone on board - he is aware to report to the ER if vomiting/ diarrhea worsens or persists or if he develops a fever along with this as he will need hydration  The patient is also aware that I have scheduled him for his DCCV on Thursday 07/24/17 at  7:30 am with Dr. Rockey Situ. He is aware he will need to arrive at 6:30 am. The patient will talk with [facility] about this and make sure they can bring him that early. He will call back if this is a problem. He is aware to keep his follow up on 07/22/17 with Dr. Rockey Situ.  Medication instructions were reviewed with the patient multiple times and he verbalized understanding.   I advised the patient we will call him back on Monday to follow up on how he is doing. He is agreeable.

## 2017-07-11 NOTE — Telephone Encounter (Signed)
Spoke with patient who advised me that he has spoken to a nurse this AM.  I see that Nira Conn has addressed the matter.

## 2017-07-11 NOTE — Telephone Encounter (Signed)
Message sent to Dr. [name] to please call me to discuss patient symptoms and review lab results.

## 2017-07-11 NOTE — Telephone Encounter (Signed)
Pt states  Amiodarone Has made him vomit and diarrhea. Please call.

## 2017-07-14 ENCOUNTER — Ambulatory Visit: Payer: Medicare HMO | Admitting: Internal Medicine

## 2017-07-14 DIAGNOSIS — I480 Paroxysmal atrial fibrillation: Secondary | ICD-10-CM | POA: Diagnosis not present

## 2017-07-14 DIAGNOSIS — I5023 Acute on chronic systolic (congestive) heart failure: Secondary | ICD-10-CM | POA: Diagnosis not present

## 2017-07-14 DIAGNOSIS — I110 Hypertensive heart disease with heart failure: Secondary | ICD-10-CM | POA: Diagnosis not present

## 2017-07-14 NOTE — Telephone Encounter (Signed)
I called and spoke with the patient to follow up on how he is feeling today. He states he is not feeling well overall.  He cannot tell if his HR's are still fast or not.  He did reduce metoprolol succ to 50 mg once daily over the weekend and did not have any more nausea/ vomiting/ diarrhea.  He states he is trying to get a ride to the ER for further evaluation. I asked if he felt like he needed to call 911 and he declined. He thinks the medicine is causing him to feel bad. I offered him an appointment in clinic tomorrow with Dr. [name] for follow up. He stated he is off work all week and just preferred to go to the ER for evaluation so he could be taken of and come out feeling better. I advised him I am in agreement with further evaluation in the ER since we cannot determine at this point if his HR's are still fast or not or if the medicine has slowed him down quite a bit.  He states he is working on a ride to get there tomorrow.  I advised him I will watch to see if he makes it to the ER. To Dr. [name] as an Juluis Rainier.

## 2017-07-15 ENCOUNTER — Emergency Department: Payer: Medicare HMO

## 2017-07-15 ENCOUNTER — Encounter: Payer: Self-pay | Admitting: Emergency Medicine

## 2017-07-15 ENCOUNTER — Inpatient Hospital Stay
Admission: EM | Admit: 2017-07-15 | Discharge: 2017-07-19 | DRG: 286 | Disposition: A | Discharge status: Home / Self Care | Payer: Medicare HMO | Attending: Internal Medicine | Admitting: Internal Medicine

## 2017-07-15 ENCOUNTER — Encounter: Payer: Self-pay | Admitting: Internal Medicine

## 2017-07-15 ENCOUNTER — Other Ambulatory Visit: Payer: Self-pay

## 2017-07-15 DIAGNOSIS — I130 Hypertensive heart and chronic kidney disease with heart failure and stage 1 through stage 4 chronic kidney disease, or unspecified chronic kidney disease: Secondary | ICD-10-CM | POA: Diagnosis not present

## 2017-07-15 DIAGNOSIS — N183 Chronic kidney disease, stage 3 (moderate): Secondary | ICD-10-CM | POA: Diagnosis present

## 2017-07-15 DIAGNOSIS — I480 Paroxysmal atrial fibrillation: Principal | ICD-10-CM | POA: Diagnosis present

## 2017-07-15 DIAGNOSIS — Z7901 Long term (current) use of anticoagulants: Secondary | ICD-10-CM

## 2017-07-15 DIAGNOSIS — I471 Supraventricular tachycardia: Secondary | ICD-10-CM | POA: Diagnosis not present

## 2017-07-15 DIAGNOSIS — I10 Essential (primary) hypertension: Secondary | ICD-10-CM | POA: Diagnosis not present

## 2017-07-15 DIAGNOSIS — E876 Hypokalemia: Secondary | ICD-10-CM | POA: Diagnosis present

## 2017-07-15 DIAGNOSIS — I481 Persistent atrial fibrillation: Secondary | ICD-10-CM | POA: Diagnosis not present

## 2017-07-15 DIAGNOSIS — I482 Chronic atrial fibrillation, unspecified: Secondary | ICD-10-CM | POA: Diagnosis present

## 2017-07-15 DIAGNOSIS — Z87891 Personal history of nicotine dependence: Secondary | ICD-10-CM

## 2017-07-15 DIAGNOSIS — R0602 Shortness of breath: Secondary | ICD-10-CM | POA: Diagnosis not present

## 2017-07-15 DIAGNOSIS — I429 Cardiomyopathy, unspecified: Secondary | ICD-10-CM | POA: Diagnosis present

## 2017-07-15 DIAGNOSIS — Z820 Family history of epilepsy and other diseases of the nervous system: Secondary | ICD-10-CM | POA: Diagnosis not present

## 2017-07-15 DIAGNOSIS — I5023 Acute on chronic systolic (congestive) heart failure: Secondary | ICD-10-CM | POA: Diagnosis not present

## 2017-07-15 DIAGNOSIS — I272 Pulmonary hypertension, unspecified: Secondary | ICD-10-CM | POA: Diagnosis present

## 2017-07-15 DIAGNOSIS — I4891 Unspecified atrial fibrillation: Secondary | ICD-10-CM | POA: Diagnosis not present

## 2017-07-15 DIAGNOSIS — I110 Hypertensive heart disease with heart failure: Secondary | ICD-10-CM | POA: Diagnosis not present

## 2017-07-15 DIAGNOSIS — I4892 Unspecified atrial flutter: Secondary | ICD-10-CM | POA: Diagnosis not present

## 2017-07-15 DIAGNOSIS — I420 Dilated cardiomyopathy: Secondary | ICD-10-CM | POA: Diagnosis not present

## 2017-07-15 DIAGNOSIS — I5043 Acute on chronic combined systolic (congestive) and diastolic (congestive) heart failure: Secondary | ICD-10-CM | POA: Diagnosis present

## 2017-07-15 DIAGNOSIS — I509 Heart failure, unspecified: Secondary | ICD-10-CM | POA: Diagnosis not present

## 2017-07-15 LAB — BASIC METABOLIC PANEL
Anion gap: 10 (ref 5–15)
BUN: 35 mg/dL — ABNORMAL HIGH (ref 6–20)
CO2: 24 mmol/L (ref 22–32)
CREATININE: 1.4 mg/dL — AB (ref 0.61–1.24)
Calcium: 8.7 mg/dL — ABNORMAL LOW (ref 8.9–10.3)
Chloride: 104 mmol/L (ref 101–111)
GFR calc non Af Amer: 51 mL/min — ABNORMAL LOW (ref >60)
GFR, EST AFRICAN AMERICAN: 59 mL/min — AB (ref >60)
Glucose, Bld: 132 mg/dL — ABNORMAL HIGH (ref 65–99)
Potassium: 3 mmol/L — ABNORMAL LOW (ref 3.5–5.1)
Sodium: 138 mmol/L (ref 135–145)

## 2017-07-15 LAB — CBC
HEMATOCRIT: 38.1 % — AB (ref 40.0–52.0)
Hemoglobin: 12.8 g/dL — ABNORMAL LOW (ref 13.0–18.0)
MCH: 29.9 pg (ref 26.0–34.0)
MCHC: 33.6 g/dL (ref 32.0–36.0)
MCV: 89 fL (ref 80.0–100.0)
PLATELETS: 373 10*3/uL (ref 150–440)
RBC: 4.28 MIL/uL — AB (ref 4.40–5.90)
RDW: 16.6 % — ABNORMAL HIGH (ref 11.5–14.5)
WBC: 4.7 10*3/uL (ref 3.8–10.6)

## 2017-07-15 LAB — PROTIME-INR
INR: 3.58
Prothrombin Time: 35.5 seconds — ABNORMAL HIGH (ref 11.4–15.2)

## 2017-07-15 LAB — TROPONIN I
Troponin I: <0.03 ng/mL (ref <0.03)
Troponin I: 0.03 ng/mL (ref <0.03)

## 2017-07-15 LAB — BRAIN NATRIURETIC PEPTIDE: B Natriuretic Peptide: 4283 pg/mL — ABNORMAL HIGH (ref 0.0–100.0)

## 2017-07-15 LAB — MAGNESIUM: MAGNESIUM: 2 mg/dL (ref 1.7–2.4)

## 2017-07-15 MED ORDER — DOCUSATE SODIUM 100 MG PO CAPS
100.0000 mg | ORAL_CAPSULE | Freq: Two times a day (BID) | ORAL | Status: DC
  Administered 2017-07-16 – 2017-07-17 (×2): 100 mg via ORAL
  Filled 2017-07-15 (×4): qty 1

## 2017-07-15 MED ORDER — SPIRONOLACTONE 25 MG PO TABS
25.0000 mg | ORAL_TABLET | Freq: Every day | ORAL | Status: DC
  Administered 2017-07-16 – 2017-07-19 (×4): 25 mg via ORAL
  Filled 2017-07-15 (×4): qty 1

## 2017-07-15 MED ORDER — POTASSIUM CHLORIDE 20 MEQ PO PACK
60.0000 meq | PACK | Freq: Once | ORAL | Status: AC
  Administered 2017-07-15: 60 meq via ORAL
  Filled 2017-07-15: qty 3

## 2017-07-15 MED ORDER — AMIODARONE LOAD VIA INFUSION
150.0000 mg | Freq: Once | INTRAVENOUS | Status: AC
  Administered 2017-07-15: 150 mg via INTRAVENOUS
  Filled 2017-07-15: qty 83.34

## 2017-07-15 MED ORDER — ONDANSETRON HCL 4 MG PO TABS: 4.0000 mg | ORAL_TABLET | Freq: Four times a day (QID) | ORAL | Status: DC | PRN

## 2017-07-15 MED ORDER — POLYETHYLENE GLYCOL 3350 17 G PO PACK: 17.0000 g | PACK | Freq: Every day | ORAL | Status: DC | PRN

## 2017-07-15 MED ORDER — LISINOPRIL 20 MG PO TABS
20.0000 mg | ORAL_TABLET | Freq: Every day | ORAL | Status: DC
  Administered 2017-07-16 – 2017-07-19 (×4): 20 mg via ORAL
  Filled 2017-07-15 (×4): qty 1

## 2017-07-15 MED ORDER — SODIUM CHLORIDE 0.9 % IV SOLN: 250.0000 mL | INTRAVENOUS | Status: DC | PRN

## 2017-07-15 MED ORDER — RIVAROXABAN 20 MG PO TABS: 20.0000 mg | ORAL_TABLET | Freq: Every day | ORAL | Status: DC

## 2017-07-15 MED ORDER — ATORVASTATIN CALCIUM 20 MG PO TABS
40.0000 mg | ORAL_TABLET | Freq: Every day | ORAL | Status: DC
  Administered 2017-07-16 – 2017-07-18 (×3): 40 mg via ORAL
  Filled 2017-07-15 (×3): qty 2

## 2017-07-15 MED ORDER — AMIODARONE HCL IN DEXTROSE 360-4.14 MG/200ML-% IV SOLN
60.0000 mg/h | INTRAVENOUS | Status: AC
  Administered 2017-07-15 (×2): 60 mg/h via INTRAVENOUS
  Filled 2017-07-15 (×2): qty 200

## 2017-07-15 MED ORDER — SODIUM CHLORIDE 0.9% FLUSH
3.0000 mL | Freq: Two times a day (BID) | INTRAVENOUS | Status: DC
  Administered 2017-07-15 – 2017-07-19 (×6): 3 mL via INTRAVENOUS

## 2017-07-15 MED ORDER — SODIUM CHLORIDE 0.9% FLUSH: 3.0000 mL | INTRAVENOUS | Status: DC | PRN

## 2017-07-15 MED ORDER — ACETAMINOPHEN 325 MG PO TABS: 650.0000 mg | ORAL_TABLET | Freq: Four times a day (QID) | ORAL | Status: DC | PRN

## 2017-07-15 MED ORDER — ONDANSETRON HCL 4 MG/2ML IJ SOLN
4.0000 mg | Freq: Four times a day (QID) | INTRAMUSCULAR | Status: DC | PRN
  Administered 2017-07-16: 4 mg via INTRAVENOUS
  Filled 2017-07-15: qty 2

## 2017-07-15 MED ORDER — HYDROCODONE-ACETAMINOPHEN 5-325 MG PO TABS: 1.0000 | ORAL_TABLET | ORAL | Status: DC | PRN

## 2017-07-15 MED ORDER — ACETAMINOPHEN 650 MG RE SUPP: 650.0000 mg | Freq: Four times a day (QID) | RECTAL | Status: DC | PRN

## 2017-07-15 MED ORDER — FUROSEMIDE 10 MG/ML IJ SOLN
20.0000 mg | Freq: Two times a day (BID) | INTRAMUSCULAR | Status: DC
  Administered 2017-07-15 – 2017-07-18 (×6): 20 mg via INTRAVENOUS
  Filled 2017-07-15 (×5): qty 2
  Filled 2017-07-15: qty 4

## 2017-07-15 MED ORDER — SODIUM CHLORIDE 0.9% FLUSH
3.0000 mL | Freq: Two times a day (BID) | INTRAVENOUS | Status: DC
  Administered 2017-07-18: 3 mL via INTRAVENOUS

## 2017-07-15 MED ORDER — AMIODARONE HCL IN DEXTROSE 360-4.14 MG/200ML-% IV SOLN
30.0000 mg/h | INTRAVENOUS | Status: DC
  Administered 2017-07-15 – 2017-07-16 (×3): 30 mg/h via INTRAVENOUS
  Filled 2017-07-15 (×2): qty 200

## 2017-07-15 NOTE — Progress Notes (Signed)
Called and spoke with the patient this morning. He is convinced that metoprolol succinate is intolerable.  Is noteworthy that he was on metoprolol tartrate in the hospital prior to discharge it would be worth trying this if he returns.  He is also convinced that amiodarone is responsible for his vomiting.  However, this occurred in the context of his metoprolol succinate and I have encouraged him strongly to try to take the amiodarone as the likelihood that we can maintain sinus rhythm without any antiarrhythmic drug is very low and that with his ejection fraction at 15-20% in the short-term, drug options are extremely limited.  He is agreeable to taking it.  He is quite short of breath.  He is thinking about coming to the hospital.  If he does, I suspect he will be in heart failure and should be admitted for ongoing amiodarone and then cardioversion.  He says he has been taking his anticoagulation daily as prescribed

## 2017-07-15 NOTE — ED Triage Notes (Signed)
Pt to ED from home c/o SOB x2 weeks, dyspnea with exertion, denies pain.  A&Ox4, speaking in complete and coherent sentences, chest rise even and unlabored, skin warm and dry.

## 2017-07-15 NOTE — ED Notes (Signed)
Unable to get IV and re draw of blue tube times two. Will ask another RN to try at this time.

## 2017-07-15 NOTE — H&P (Signed)
[facility] NAME: Javonnie Illescas    MR#:  [id number]  DATE OF BIRTH:  1950/07/25  DATE OF ADMISSION:  07/15/2017  PRIMARY CARE PHYSICIAN: Patient, [name]   REQUESTING/REFERRING PHYSICIAN:   CHIEF COMPLAINT:   Chief Complaint  Patient presents with  . Shortness of Breath    HISTORY OF PRESENT ILLNESS: Bracen Schum  is a 67 y.o. male with a known history per below which also includes paroxysmal A. fib/a flutter, status post ablation/cardioversions in the past, noted ejection fraction 15 to 20% on echo done earlier this month, on Xarelto, presenting to the emergency room for 2 3 months of worsening shortness of breath, dyspnea on exertion, in the emergency room patient was found to have heart rate ranging from the 60s to 130s range per ED attending, potassium of 3, BNP greater than 4000, chest x-ray noted for cardiomegaly/COPD, and discussion with ED attending-plan is for admission with possible cardioversion on tomorrow, amiodarone drip was started, patient evaluated in the emergency room, no apparent distress, resting comfortably in bed, patient is now being admitted for acute on chronic systolic congestive heart failure exacerbation due to paroxysmal A. Fib.  PAST MEDICAL HISTORY:   Past Medical History:  Diagnosis Date  . Atrial flutter ([place])    a. s/p TEE/DCCV 04/2017; b. CHADS2VASc => 3 (CHF, HTN, age x 1); c. not compliant with Xarelto  . Chronic combined systolic and diastolic CHF (congestive heart failure) ([place])    a. TTE 2/19: EF 20-25%, diffuse HK, mod dilated LA, mildly reduced RVSF, PASP 42; b. TTE 3/19: EF 20-25%, diffuse HK, RVSF nl, atrial flutter with RVR  . Essential hypertension   . Pulmonary hypertension ([place])     PAST SURGICAL HISTORY:  Past Surgical History:  Procedure Laterality Date  . A-FLUTTER ABLATION N/A 06/16/2017   Procedure: A-FLUTTER ABLATION;  Surgeon: [name], MD;  Location: [facility];   Service: Cardiovascular;  Laterality: N/A;  . CARDIOVERSION N/A 05/01/2017   Procedure: CARDIOVERSION;  Surgeon: [name], MD;  Location: [facility] ORS;  Service: Cardiovascular;  Laterality: N/A;  . TEE WITHOUT CARDIOVERSION N/A 05/01/2017   Procedure: TRANSESOPHAGEAL ECHOCARDIOGRAM (TEE);  Surgeon: [name], MD;  Location: [facility] ORS;  Service: Cardiovascular;  Laterality: N/A;  . TEE WITHOUT CARDIOVERSION N/A 06/16/2017   Procedure: TRANSESOPHAGEAL ECHOCARDIOGRAM (TEE);  Surgeon: [name], MD;  Location: [facility] ENDOSCOPY;  Service: Cardiovascular;  Laterality: N/A;    SOCIAL HISTORY:  Social History   Tobacco Use  . Smoking status: Former Smoker    Last attempt to quit: 04/21/2017    Years since quitting: 0.2  . Smokeless tobacco: Never Used  Substance Use Topics  . Alcohol use: Yes    Alcohol/week: 1.8 oz    Types: 3 Cans of beer per week    Frequency: Never    Comment: Drink a half of a 40oz beer every day, drank heavily in the past    FAMILY HISTORY:  Family History  Problem Relation Age of Onset  . Alzheimer's disease Mother   . Alzheimer's disease Father     DRUG ALLERGIES: No Known Allergies  REVIEW OF SYSTEMS:   CONSTITUTIONAL: No fever,+ fatigue , weakness.  EYES: No blurred or double vision.  EARS, NOSE, AND THROAT: No tinnitus or ear pain.  RESPIRATORY: No cough,+ shortness of breath, no wheezing or hemoptysis.  CARDIOVASCULAR: No chest pain, orthopnea, +edema.  GASTROINTESTINAL: No nausea, vomiting,  diarrhea or abdominal pain.  GENITOURINARY: No dysuria, hematuria.  ENDOCRINE: No polyuria, nocturia,  HEMATOLOGY: No anemia, easy bruising or bleeding SKIN: No rash or lesion. MUSCULOSKELETAL: No joint pain or arthritis.   NEUROLOGIC: No tingling, numbness, weakness.  PSYCHIATRY: No anxiety or depression.   MEDICATIONS AT HOME:  Prior to Admission medications   Medication Sig Start Date End Date Taking? Authorizing Provider  amiodarone  (PACERONE) 200 MG tablet Take 2 tablets (400 mg) by mouth TWICE daily x 2 weeks, then take 2 tablets (400 mg) by mouth ONCE daily x 2 weeks 07/10/17   Deboraha Sprang, MD  amiodarone (PACERONE) 200 MG tablet Take 1 tablet (200 mg total) by mouth daily. 07/10/17   Deboraha Sprang, MD  atorvastatin (LIPITOR) 40 MG tablet Take 1 tablet (40 mg total) by mouth daily at 6 PM. 06/17/17   Baldwin Jamaica, PA-C  furosemide (LASIX) 40 MG tablet Take 1 tablet (40 mg total) by mouth daily. 06/17/17 12/14/17  Baldwin Jamaica, PA-C  lisinopril (PRINIVIL,ZESTRIL) 20 MG tablet Take 1 tablet (20 mg total) by mouth daily. 06/17/17 12/14/17  Baldwin Jamaica, PA-C  rivaroxaban (XARELTO) 20 MG TABS tablet Take 1 tablet (20 mg total) by mouth daily with supper. 06/17/17   Baldwin Jamaica, PA-C  spironolactone (ALDACTONE) 25 MG tablet Take 1 tablet (25 mg total) by mouth daily. 07/11/17 10/09/17  Deboraha Sprang, MD      PHYSICAL EXAMINATION:   VITAL SIGNS: Blood pressure 134/84, pulse 67, temperature 98.6 F (37 C), temperature source Oral, resp. rate 16, height 6\' 5"  (1.956 m), weight 105.7 kg (233 lb), SpO2 99 %.  GENERAL:  67 y.o.-year-old patient lying in the bed with no acute distress.  EYES: Pupils equal, round, reactive to light and accommodation. No scleral icterus. Extraocular muscles intact.  HEENT: Head atraumatic, normocephalic. Oropharynx and nasopharynx clear.  NECK:  Supple, no jugular venous distention. No thyroid enlargement, no tenderness.  LUNGS: Normal breath sounds bilaterally, no wheezing, rales,rhonchi or crepitation. No use of accessory muscles of respiration.  CARDIOVASCULAR: S1, S2 normal. No murmurs, rubs, or gallops.  ABDOMEN: Soft, nontender, nondistended. Bowel sounds present. No organomegaly or mass.  EXTREMITIES: + edema, no cyanosis, or clubbing.  NEUROLOGIC: Cranial nerves II through XII are intact. MAES. Gait not checked.  PSYCHIATRIC: The patient is alert and oriented x 3.  SKIN: No  obvious rash, lesion, or ulcer.   LABORATORY PANEL:   CBC Recent Labs  Lab 07/15/17 1455  WBC 4.7  HGB 12.8*  HCT 38.1*  PLT 373  MCV 89.0  MCH 29.9  MCHC 33.6  RDW 16.6*   ------------------------------------------------------------------------------------------------------------------  Chemistries  Recent Labs  Lab 07/10/17 1330 07/15/17 1455  NA 140 138  K 3.2* 3.0*  CL 103 104  CO2 25 24  GLUCOSE 114* 132*  BUN 17 35*  CREATININE 1.26* 1.40*  CALCIUM 9.0 8.7*  MG 1.6*  --    ------------------------------------------------------------------------------------------------------------------ estimated creatinine clearance is 65.4 mL/min (A) (by C-G formula based on SCr of 1.4 mg/dL (H)). ------------------------------------------------------------------------------------------------------------------ No results for input(s): TSH, T4TOTAL, T3FREE, THYROIDAB in the last 72 hours.  Invalid input(s): FREET3   Coagulation profile No results for input(s): INR, PROTIME in the last 168 hours. ------------------------------------------------------------------------------------------------------------------- No results for input(s): DDIMER in the last 72 hours. -------------------------------------------------------------------------------------------------------------------  Cardiac Enzymes Recent Labs  Lab 07/15/17 1455  TROPONINI <0.03   ------------------------------------------------------------------------------------------------------------------ Invalid input(s): POCBNP  ---------------------------------------------------------------------------------------------------------------  Urinalysis No results found for: COLORURINE, APPEARANCEUR, [place], [place],  GLUCOSEU, HGBUR, BILIRUBINUR, KETONESUR, PROTEINUR, UROBILINOGEN, NITRITE, LEUKOCYTESUR   RADIOLOGY: Dg Chest 2 View  Result Date: 07/15/2017 CLINICAL DATA:  Shortness of breath, dyspnea for 2 weeks.  EXAM: CHEST - 2 VIEW COMPARISON:  Chest radiograph June 12, 2017 FINDINGS: Cardiac silhouette is mildly enlarged, mediastinal silhouette is normal. Mildly calcified aortic arch. No pleural effusions or focal consolidations. Similar bronchitic changes. Similar bibasilar strandy densities with mild hyperexpansion. Slight RIGHT lung base pleural thickening. Trachea projects midline and there is no pneumothorax. Soft tissue planes and included osseous structures are non-suspicious. IMPRESSION: Similar bronchitic changes and hyperinflation, possible COPD. Bibasilar atelectasis/scarring. Mild cardiomegaly. Aortic Atherosclerosis (ICD10-I70.0). Electronically Signed   By: Elon Alas M.D.   On: 07/15/2017 15:41    EKG: Orders placed or performed during the hospital encounter of 07/15/17  . EKG 12-Lead  . EKG 12-Lead  . EKG 12-Lead  . EKG 12-Lead  . ED EKG within 10 minutes  . ED EKG within 10 minutes    IMPRESSION AND PLAN: *Acute on chronic systolic congestive heart failure exacerbation, mild Secondary to A. fib with RVR Admit to telemetry floor bed, continue amiodarone drip, cardiology to see, IV Lasix twice daily, strict I&O monitoring, daily weights, potassium repletion, BMP in the morning, continue close medical monitoring TEE done earlier this month noted for ejection fraction 15-20%  *Acute A. fib with RVR Continue Xarelto, amiodarone drip, replete potassium, BMP in the morning, check magnesium level, cardiology to see-possible cardioversion on tomorrow  *Acute hypokalemia Plan of care as stated above  *Chronic benign essential hypertension Stable Lasix, lisinopril, spironolactone, IV hydralazine as needed systolic blood pressure greater than 160, vitals per routine, make changes as per necessary   All the records are reviewed and case discussed with ED provider. Management plans discussed with the patient, family and they are in agreement.  CODE STATUS:full Code Status  History    Date Active Date Inactive Code Status Order ID Comments User Context   06/16/2017 1915 06/17/2017 1850 Full Code [id number]  [name], MD Inpatient   06/15/2017 1702 06/16/2017 1915 Full Code [id number]  Daune Perch, NP Inpatient   06/12/2017 2142 06/15/2017 1523 Full Code [id number]  Demetrios Loll, MD Inpatient   04/29/2017 2333 05/01/2017 1920 Full Code [id number]  Lance Coon, MD Inpatient       TOTAL TIME TAKING CARE OF THIS PATIENT: 45 minutes.    Avel Peace Salary M.D on 07/15/2017   Between 7am to 6pm - Pager - [phone]  After 6pm go to [email or web address] - password [email or web address] [facility]  Office  [phone]  CC: Primary care physician; Patient, [name]   Note: This dictation was prepared with Dragon dictation along with smaller phrase technology. Any transcriptional errors that result from this process are unintentional.

## 2017-07-15 NOTE — ED Provider Notes (Addendum)
[facility] Emergency Department Provider Note  ____________________________________________  Time seen: Approximately 4:58 PM  I have reviewed the triage vital signs and the nursing notes.   HISTORY  Chief Complaint Shortness of Breath    HPI Nathan [name]. is a 67 y.o. male who complains of shortness of breath for the past 2-3 months, constant, waxing and waning. No aggravating or alleviating factors. He has seen cardiology for this, had an ablation but then subsequently had recurrence of his dysrhythmia, requiring electrical cardioversion. He is on Eliquis. His chronic atrial flutter is also associated with decompensation of systolic heart failure. The patient is taking Aldactone and Lasix daily, but reports he could not take metoprolol or amiodarone due to side effect intolerance. He called cardiology earlier today who advised that if he came to the ED today he would likely need to be hospitalized for IV amiodarone and cardioversion again.  The patient denies chest pain. he has dyspnea on exertion. Denies orthopnea or PND.     Past Medical History:  Diagnosis Date  . Atrial flutter ([place])    a. s/p TEE/DCCV 04/2017; b. CHADS2VASc => 3 (CHF, HTN, age x 1); c. not compliant with Xarelto  . Chronic combined systolic and diastolic CHF (congestive heart failure) ([place])    a. TTE 2/19: EF 20-25%, diffuse HK, mod dilated LA, mildly reduced RVSF, PASP 42; b. TTE 3/19: EF 20-25%, diffuse HK, RVSF nl, atrial flutter with RVR  . Essential hypertension   . Pulmonary hypertension [facility])      Patient Active Problem List   Diagnosis Date Noted  . Chronic systolic heart failure ([place]) 07/09/2017  . HTN (hypertension) 07/09/2017  . Tobacco use 07/09/2017  . Atrial flutter ([place]) 06/15/2017  . Acute on chronic systolic heart failure ([place]) 06/15/2017  . A-fib ([place]) 06/12/2017  . Atrial flutter with rapid ventricular response ([place]) 04/29/2017  . Elevated troponin  04/29/2017  . AKI (acute kidney injury) ([place]) 04/29/2017     Past Surgical History:  Procedure Laterality Date  . A-FLUTTER ABLATION N/A 06/16/2017   Procedure: A-FLUTTER ABLATION;  Surgeon: [name], MD;  Location: [facility];  Service: Cardiovascular;  Laterality: N/A;  . CARDIOVERSION N/A 05/01/2017   Procedure: CARDIOVERSION;  Surgeon: [name], MD;  Location: [facility] ORS;  Service: Cardiovascular;  Laterality: N/A;  . TEE WITHOUT CARDIOVERSION N/A 05/01/2017   Procedure: TRANSESOPHAGEAL ECHOCARDIOGRAM (TEE);  Surgeon: [name], MD;  Location: [facility] ORS;  Service: Cardiovascular;  Laterality: N/A;  . TEE WITHOUT CARDIOVERSION N/A 06/16/2017   Procedure: TRANSESOPHAGEAL ECHOCARDIOGRAM (TEE);  Surgeon: [name], MD;  Location: [facility] ENDOSCOPY;  Service: Cardiovascular;  Laterality: N/A;     Prior to Admission medications   Medication Sig Start Date End Date Taking? Authorizing Provider  amiodarone (PACERONE) 200 MG tablet Take 2 tablets (400 mg) by mouth TWICE daily x 2 weeks, then take 2 tablets (400 mg) by mouth ONCE daily x 2 weeks 07/10/17   Deboraha Sprang, MD  amiodarone (PACERONE) 200 MG tablet Take 1 tablet (200 mg total) by mouth daily. 07/10/17   Deboraha Sprang, MD  atorvastatin (LIPITOR) 40 MG tablet Take 1 tablet (40 mg total) by mouth daily at 6 PM. 06/17/17   Baldwin Jamaica, PA-C  furosemide (LASIX) 40 MG tablet Take 1 tablet (40 mg total) by mouth daily. 06/17/17 12/14/17  Baldwin Jamaica, PA-C  lisinopril (PRINIVIL,ZESTRIL) 20 MG tablet Take 1 tablet (20 mg total) by mouth  daily. 06/17/17 12/14/17  Baldwin Jamaica, PA-C  rivaroxaban (XARELTO) 20 MG TABS tablet Take 1 tablet (20 mg total) by mouth daily with supper. 06/17/17   Baldwin Jamaica, PA-C  spironolactone (ALDACTONE) 25 MG tablet Take 1 tablet (25 mg total) by mouth daily. 07/11/17 10/09/17  Deboraha Sprang, MD     Allergies Patient has no known allergies.   Family History   Problem Relation Age of Onset  . Alzheimer's disease Mother   . Alzheimer's disease Father     Social History Social History   Tobacco Use  . Smoking status: Former Smoker    Last attempt to quit: 04/21/2017    Years since quitting: 0.2  . Smokeless tobacco: Never Used  Substance Use Topics  . Alcohol use: Yes    Alcohol/week: 1.8 oz    Types: 3 Cans of beer per week    Frequency: Never    Comment: Drink a half of a 40oz beer every day, drank heavily in the past  . Drug use: Never    Review of Systems  Constitutional:   No fever or chills.  ENT:   No sore throat. No rhinorrhea. Cardiovascular:   No chest pain or syncope. Respiratory:   positive as above shortness of breath without cough. Gastrointestinal:   Negative for abdominal pain, vomiting and diarrhea.  Musculoskeletal:   Negative for focal pain or swelling All other systems reviewed and are negative except as documented above in ROS and HPI.  ____________________________________________   PHYSICAL EXAM:  VITAL SIGNS: ED Triage Vitals [07/15/17 1453]  Enc Vitals Group     BP 134/84     Pulse Rate 67     Resp 16     Temp 98.6 F (37 C)     Temp Source Oral     SpO2 99 %     Weight 233 lb (105.7 kg)     Height 6\' 5"  (1.956 m)     Head Circumference      Peak Flow      Pain Score 0     Pain Loc      Pain Edu?      Excl. in [place]?     Vital signs reviewed, nursing assessments reviewed.   Constitutional:   Alert and oriented. Well appearing and in no distress. Eyes:   Conjunctivae are normal. EOMI. PERRL. ENT      Head:   Normocephalic and atraumatic.      Nose:   No congestion/rhinnorhea.       Mouth/Throat:   MMM, no pharyngeal erythema. No peritonsillar mass.       Neck:   No meningismus. Full ROM. Hematological/Lymphatic/Immunilogical:   No cervical lymphadenopathy. Cardiovascular:   irregularly irregular rhythm, tachycardia heart rate 110. Symmetric bilateral radial and DP pulses.  No murmurs.   Respiratory:   Normal respiratory effort without tachypnea/retractions. Breath sounds are clear and equal bilaterally. No wheezes/rales/rhonchi. Gastrointestinal:   Soft and nontender. Non distended. There is no CVA tenderness.  No rebound, rigidity, or guarding. Genitourinary:   deferred Musculoskeletal:   Normal range of motion in all extremities. No joint effusions.  No lower extremity tenderness.  No edema. Neurologic:   Normal speech and language.  Motor grossly intact. No acute focal neurologic deficits are appreciated.  Skin:    Skin is warm, dry and intact. No rash noted.  No petechiae, purpura, or bullae.  ____________________________________________    LABS (pertinent positives/negatives) (all labs ordered are listed, but only  abnormal results are displayed) Labs Reviewed  BASIC METABOLIC PANEL - Abnormal; Notable for the following components:      Result Value   Potassium 3.0 (*)    Glucose, Bld 132 (*)    BUN 35 (*)    Creatinine, Ser 1.40 (*)    Calcium 8.7 (*)    GFR calc non Af Amer 51 (*)    GFR calc Af Amer 59 (*)    All other components within normal limits  CBC - Abnormal; Notable for the following components:   RBC 4.28 (*)    Hemoglobin 12.8 (*)    HCT 38.1 (*)    RDW 16.6 (*)    All other components within normal limits  BRAIN NATRIURETIC PEPTIDE - Abnormal; Notable for the following components:   B Natriuretic Peptide 4,283.0 (*)    All other components within normal limits  TROPONIN I  PROTIME-INR   ____________________________________________   EKG  interpreted by me Sinus rhythm rate of 67, normal axis and intervals. Normal QRS ST segments and T waves. 2 PVCs on the strip.  subsequent EKG 5 minutes after the first one shows atrial fibrillation with rapid ventricular response, rate 139. Normal axis intervals QRS ST segments and T waves. 5 PVCs on the strip.  ____________________________________________    RADIOLOGY  Dg Chest 2  View  Result Date: 07/15/2017 CLINICAL DATA:  Shortness of breath, dyspnea for 2 weeks. EXAM: CHEST - 2 VIEW COMPARISON:  Chest radiograph June 12, 2017 FINDINGS: Cardiac silhouette is mildly enlarged, mediastinal silhouette is normal. Mildly calcified aortic arch. No pleural effusions or focal consolidations. Similar bronchitic changes. Similar bibasilar strandy densities with mild hyperexpansion. Slight RIGHT lung base pleural thickening. Trachea projects midline and there is no pneumothorax. Soft tissue planes and included osseous structures are non-suspicious. IMPRESSION: Similar bronchitic changes and hyperinflation, possible COPD. Bibasilar atelectasis/scarring. Mild cardiomegaly. Aortic Atherosclerosis (ICD10-I70.0). Electronically Signed   By: Elon Alas M.D.   On: 07/15/2017 15:41    ____________________________________________   PROCEDURES .Critical Care Performed by: Carrie Mew, MD Authorized by: Carrie Mew, MD   Critical care provider statement:    Critical care time (minutes):  30   Critical care time was exclusive of:  Separately billable procedures and treating other patients   Critical care was necessary to treat or prevent imminent or life-threatening deterioration of the following conditions:  Cardiac failure   Critical care was time spent personally by me on the following activities:  Development of treatment plan with patient or surrogate, discussions with consultants, evaluation of patient's response to treatment, examination of patient, obtaining history from patient or surrogate, ordering and performing treatments and interventions, ordering and review of laboratory studies, ordering and review of radiographic studies, pulse oximetry, re-evaluation of patient's condition and review of old charts    ____________________________________________  DIFFERENTIAL DIAGNOSIS   Paroxysmal atrial fibrillation, atrial flutter, decompensated systolic heart  failure  CLINICAL IMPRESSION / ASSESSMENT AND PLAN / ED COURSE  Pertinent labs & imaging results that were available during my care of the patient were reviewed by me and considered in my medical decision making (see chart for details).    patient presents with acute on chronic shortness of breath related to his systolic heart failure and A. fib/flutter. This may be exacerbated by noncompliance with metoprolol and amiodarone due to side effect intolerance. Low suspicion for ACS PE dissection. He reports compliance with Eliquis. Discussed with cardiology doctor End who agrees that per Dr. Olin Pia note , would  hospitalize at this time, IV amiodarone, further cardiology evaluation for likely cardioversion.      ____________________________________________   FINAL CLINICAL IMPRESSION(S) / ED DIAGNOSES    Final diagnoses:  Paroxysmal atrial fibrillation ([place])  Acute on chronic systolic heart failure [facility])     ED Discharge Orders    None      Portions of this note were generated with dragon dictation software. Dictation errors may occur despite best attempts at proofreading.    Carrie Mew, MD 07/15/17 1707    Carrie Mew, MD 07/22/17 [phone]

## 2017-07-16 DIAGNOSIS — N183 Chronic kidney disease, stage 3 (moderate): Secondary | ICD-10-CM

## 2017-07-16 DIAGNOSIS — I5043 Acute on chronic combined systolic (congestive) and diastolic (congestive) heart failure: Secondary | ICD-10-CM

## 2017-07-16 DIAGNOSIS — I481 Persistent atrial fibrillation: Secondary | ICD-10-CM

## 2017-07-16 LAB — BASIC METABOLIC PANEL
Anion gap: 12 (ref 5–15)
BUN: 34 mg/dL — ABNORMAL HIGH (ref 6–20)
CALCIUM: 9 mg/dL (ref 8.9–10.3)
CO2: 24 mmol/L (ref 22–32)
CREATININE: 1.69 mg/dL — AB (ref 0.61–1.24)
Chloride: 103 mmol/L (ref 101–111)
GFR calc Af Amer: 47 mL/min — ABNORMAL LOW (ref >60)
GFR calc non Af Amer: 41 mL/min — ABNORMAL LOW (ref >60)
Glucose, Bld: 97 mg/dL (ref 65–99)
Potassium: 3.6 mmol/L (ref 3.5–5.1)
SODIUM: 139 mmol/L (ref 135–145)

## 2017-07-16 LAB — LACTIC ACID, PLASMA: Lactic Acid, Venous: 4.9 mmol/L (ref 0.5–1.9)

## 2017-07-16 LAB — PROTIME-INR
INR: 2.77
Prothrombin Time: 29 seconds — ABNORMAL HIGH (ref 11.4–15.2)

## 2017-07-16 LAB — HEPARIN LEVEL (UNFRACTIONATED): Heparin Unfractionated: 3.18 IU/mL — ABNORMAL HIGH (ref 0.30–0.70)

## 2017-07-16 LAB — TROPONIN I
TROPONIN I: 0.04 ng/mL — AB (ref <0.03)
Troponin I: 0.03 ng/mL (ref <0.03)

## 2017-07-16 LAB — APTT: aPTT: 43 seconds — ABNORMAL HIGH (ref 24–36)

## 2017-07-16 MED ORDER — HEPARIN (PORCINE) IN NACL 100-0.45 UNIT/ML-% IJ SOLN
1150.0000 [IU]/h | INTRAMUSCULAR | Status: DC
  Administered 2017-07-16 – 2017-07-17 (×2): 1450 [IU]/h via INTRAVENOUS
  Filled 2017-07-16 (×2): qty 250

## 2017-07-16 MED ORDER — METOPROLOL SUCCINATE ER 25 MG PO TB24
12.5000 mg | ORAL_TABLET | Freq: Every day | ORAL | Status: DC
  Administered 2017-07-16 – 2017-07-19 (×3): 12.5 mg via ORAL
  Filled 2017-07-16 (×5): qty 1

## 2017-07-16 MED ORDER — HEPARIN BOLUS VIA INFUSION: 5200.0000 [IU] | Freq: Once | INTRAVENOUS | Status: DC

## 2017-07-16 NOTE — Progress Notes (Addendum)
[facility] [place] NAME: Nathan [name]    MR#:  [id number]  DATE OF BIRTH:  [birth year]  SUBJECTIVE: Admitted with A. fib with RVR.  Patient is intolerant of amiodarone, admitted for shortness of breath.  Found to have acute on chronic heart failure, A. fib with RVR.  Now on amiodarone drip.  CHIEF COMPLAINT:   Chief Complaint  Patient presents with  . Shortness of Breath    REVIEW OF SYSTEMS:    Review of Systems  Constitutional: Negative for chills and fever.  HENT: Negative for hearing loss.   Eyes: Negative for blurred vision, double vision and photophobia.  Respiratory: Negative for cough, hemoptysis and shortness of breath.   Cardiovascular: Negative for palpitations, orthopnea and leg swelling.  Gastrointestinal: Positive for nausea. Negative for abdominal pain, diarrhea and vomiting.  Genitourinary: Negative for dysuria and urgency.  Musculoskeletal: Negative for myalgias and neck pain.  Skin: Negative for rash.  Neurological: Negative for dizziness, focal weakness, seizures, weakness and headaches.  Psychiatric/Behavioral: Negative for memory loss. The patient does not have insomnia.     Nutrition:  Tolerating Diet: Tolerating PT:      DRUG ALLERGIES:  No Known Allergies  VITALS:  Blood pressure (!) 128/94, pulse 68, temperature 98.7 F (37.1 C), resp. rate 18, height 6\' 5"  (1.956 m), weight 103.6 kg (228 lb 6.4 oz), SpO2 100 %.  PHYSICAL EXAMINATION:   Physical Exam  GENERAL:  67 y.o.-year-old patient lying in the bed with no acute distress.  EYES: Pupils equal, round, reactive to light and accommodation. No scleral icterus. Extraocular muscles intact.  HEENT: Head atraumatic, normocephalic. Oropharynx and nasopharynx clear.  NECK:  Supple, no jugular venous distention. No thyroid enlargement, no tenderness.  LUNGS: Normal breath sounds bilaterally, no wheezing, rales,rhonchi or crepitation. No use of  accessory muscles of respiration.  CARDIOVASCULAR: S1, S2 irregularly irregular.  The quality is collected to have a high. No murmurs, rubs, or gallops.  ABDOMEN: Soft, nontender, nondistended. Bowel sounds present. No organomegaly or mass.  EXTREMITIES:decreased leg edema.  Marland Kitchen  NEUROLOGIC: Cranial nerves II through XII are intact. Muscle strength 5/5 in all extremities. Sensation intact. Gait not checked.  PSYCHIATRIC: The patient is alert and oriented x 3.  SKIN: No obvious rash, lesion, or ulcer.    LABORATORY PANEL:   CBC Recent Labs  Lab 07/15/17 1455  WBC 4.7  HGB 12.8*  HCT 38.1*  PLT 373   ------------------------------------------------------------------------------------------------------------------  Chemistries  Recent Labs  Lab 07/15/17 2254 07/16/17 0628  NA  --  139  K  --  3.6  CL  --  103  CO2  --  24  GLUCOSE  --  97  BUN  --  34*  CREATININE  --  1.69*  CALCIUM  --  9.0  MG 2.0  --    ------------------------------------------------------------------------------------------------------------------  Cardiac Enzymes Recent Labs  Lab 07/16/17 1052  TROPONINI 0.03*   ------------------------------------------------------------------------------------------------------------------  RADIOLOGY:  Dg Chest 2 View  Result Date: 07/15/2017 CLINICAL DATA:  Shortness of breath, dyspnea for 2 weeks. EXAM: CHEST - 2 VIEW COMPARISON:  Chest radiograph June 12, 2017 FINDINGS: Cardiac silhouette is mildly enlarged, mediastinal silhouette is normal. Mildly calcified aortic arch. No pleural effusions or focal consolidations. Similar bronchitic changes. Similar bibasilar strandy densities with mild hyperexpansion. Slight RIGHT lung base pleural thickening. Trachea projects midline and there is no pneumothorax. Soft tissue planes and included osseous structures are non-suspicious. IMPRESSION: Similar bronchitic changes  and hyperinflation, possible COPD. Bibasilar  atelectasis/scarring. Mild cardiomegaly. Aortic Atherosclerosis (ICD10-I70.0). Electronically Signed   By: Elon Alas M.D.   On: 07/15/2017 15:41     ASSESSMENT AND PLAN:   Active Problems:   Chronic a-fib ([place])  #1.  Acute on chronic systolic and diastolic heart failure in setting of atrial fibrillation with RVR: Patient is on amiodarone drip IV.  Did not tolerate amiodarone tablets at home due to nausea and GI side effects.  Continue amiodarone drip at this time, cardiology of from [facility] is following the patient, on beta-blockers at lower dose.  Continue ACE inhibitors, ARB's. 2.  Regarding heart failure patient is on IV Lasix, lactic acid level ordered marker of perfusion by cardiology. 3.  Slightly elevated troponins, patient may need cardiac cath sometime this hospitalization 4.  Atrial fibrillation with RVR, patient on heparin drip, amiodarone drip, patient will be needing DCCV in 1 to 2 days as per cardiology note.  Hold Xarelto at this time. EP consultation with Dr. [name] regarding cardioversion. All the records are reviewed and case discussed with Care Management/Social Workerr. Management plans discussed with the patient, family and they are in agreement.  CODE STATUS: full code  TOTAL TIME TAKING CARE OF THIS PATIENT: 35 minutes.   POSSIBLE D/C IN 3-4 DAYS, DEPENDING ON CLINICAL CONDITION.   Epifanio Lesches M.D on 07/16/2017 at 4:27 PM  Between 7am to 6pm - Pager - [phone]  After 6pm go to [email or web address] - password [email or web address] [facility]  Office  [phone]  CC: Primary care physician; Patient, [name]

## 2017-07-16 NOTE — Plan of Care (Signed)
  Problem: Activity: Goal: Risk for activity intolerance will decrease Outcome: Progressing   Problem: Pain Managment: Goal: General experience of comfort will improve Outcome: Progressing   Problem: Activity: Goal: Ability to tolerate increased activity will improve Outcome: Progressing   Problem: Cardiac: Goal: Ability to achieve and maintain adequate cardiopulmonary perfusion will improve Outcome: Progressing   Pt admitted from ED with afib RVR & CHF. Pt on amio gtt- but now converted to sinus rhythm. Up with standby assist.

## 2017-07-16 NOTE — Progress Notes (Signed)
ANTICOAGULATION CONSULT NOTE - Initial Consult  Pharmacy Consult for heparin Indication: Xarelto PTA but acute on chronic renal insufficiency superimposed on CKD   No Known Allergies  Patient Measurements: Height: 6\' 5"  (195.6 cm) Weight: 228 lb 6.4 oz (103.6 kg) IBW/kg (Calculated) : 89.1 Heparin Dosing Weight:   Vital Signs: Temp: 98.7 F (37.1 C) (05/01 0745) BP: 128/94 (05/01 0745) Pulse Rate: 68 (05/01 0745)  Labs: Recent Labs    07/15/17 1455 07/15/17 1724 07/15/17 2254 07/16/17 0628 07/16/17 1052  HGB 12.8*  --   --   --   --   HCT 38.1*  --   --   --   --   PLT 373  --   --   --   --   LABPROT  --  35.5*  --   --   --   INR  --  3.58  --   --   --   CREATININE 1.40*  --   --  1.69*  --   TROPONINI <0.03  --  0.03* 0.04* 0.03*    Estimated Creatinine Clearance: 54.2 mL/min (A) (by C-G formula based on SCr of 1.69 mg/dL (H)).   Medical History: Past Medical History:  Diagnosis Date  . Atrial flutter ([place])    a. s/p TEE/DCCV 04/2017; b. CHADS2VASc => 3 (CHF, HTN, age x 1); c. not compliant with Xarelto  . Chronic combined systolic and diastolic CHF (congestive heart failure) ([place])    a. TTE 2/19: EF 20-25%, diffuse HK, mod dilated LA, mildly reduced RVSF, PASP 42; b. TTE 3/19: EF 20-25%, diffuse HK, RVSF nl, atrial flutter with RVR  . Essential hypertension   . Pulmonary hypertension ([facility])     Medications:  Infusions:  . sodium chloride    . amiodarone 30 mg/hr (07/16/17 0756)  . heparin      Assessment: 67 yom cc SOB with PMH paroxysmal AF/flutter on Xarelto PTA, s/p ablation and cardioversion, EF 15 to 20% on echo from April 2019. ED finds HR 60s to 130s, BNP 4000, CXR with cardiomegaly/COPD. Plan is for possible cardioversion, amiodarone infusion. Admitted for AF/RVR in acute on chronic mixed heart failure. SCr noted to increase and now pharmacy consulted to transition from Xarelto to heparin infusion due to worsening renal insufficiency in  CKD.  Goal of Therapy:  Heparin level 0.3-0.7 units/ml aPTT 68 to 109 seconds Monitor platelets by anticoagulation protocol: Yes   Plan:  Give 5200 units bolus x 1 Start heparin infusion at 1450 units/hr Check baseline aPTT, INR, and HL; aPTT level in 6 hours and daily while on heparin until aPTT and HL correlate, per protocol Continue to monitor H&H and platelets  Laural Benes, Pharm.D., BCPS Clinical Pharmacist 07/16/2017,3:02 PM

## 2017-07-16 NOTE — Progress Notes (Signed)
Lactic Acid 4.9 Dr End notified no orders received.

## 2017-07-16 NOTE — Consult Note (Signed)
Cardiology Consultation:   Patient ID: Nathan [name].; [id number]; 08/30/1950   Admit date: 07/15/2017 Date of Consult: 07/16/2017  Primary Care Provider: Patient, [name] Primary Cardiologist: Rockey Situ Primary Electrophysiologist:  [name]   Patient Profile:   Nathan [name]. is a 67 y.o. male with a hx of chronic combined systolic and diastolic CHF diagnosed in 04/2017, atrial flutter s/p TEE/DCCV in 04/2017 with redevelopment of atrial flutter by 05/21/2017 s/p atrial flutter ablation on 06/16/2017 on Xarelto, pulmonary hypertension, CKD stage III, and tobacco abuse who is being seen today for the evaluation of acute on chronic systolic CHF and Afib with RVR at the request of Dr. Jerelyn Charles.  History of Present Illness:   Nathan [name] was previously admitted to [facility] in 04/2017 with new onset atrial flutter with RVR and acute combined CHF with pulmonary HTN. Echo on 04/30/17 showed an EF of 202-5%, diffuse hypokinesis, moderately dilated left atrium, mildly reduced RV systolic function, PASP 42 mmHg. He was adequately diuresed. He underwent TEE/DCCV on 05/01/17 that was successful. He was placed on evidence-based heart failure medications. In follow up on 2/22 he was noted to have a weight 204 pounds and was maintaining sinus rhythm. He underwent repeat echo on 3/6 to evaluate for improvement in his EF following restoration of sinus rhythm that showed he had redeveloped atrial flutter with RVR with an EF of 20-25%, diffuse hypokinesis. He was referred to EP, though did not show for his initial appointment and subsequently ran out of medications, including Xarelto. He was readmitted in late March, 2019 with acute on chronic combined CHF and atrial flutter with RVR. He was diuresed and transferred to [facility] where he underwent atrial flutter ablation on 06/16/2017 following TEE that showed an EF of 15-20% with diffuse HK, and no evidence of thrombus. Discharge weight of 224 pounds. He was seen in the office by  EP on 4/25 with increasing SOB. He was noted to be in Afib with RVR with heart rates in the 170s bpm. Weight os 233 pounds. His metoprolol was resumed and he was started on amiodarone with plans for repeat DCCV with possible Afib ablation if symptoms/rhythm persisted. He called our office on 4/26 noting vomiting and diarrhea. His amiodarone was held at that time. On 4/29 he continued to feel weak, though his vomiting and diarrhea had stopped. He noted increased fatigue and SOB with minimal activities. Stable lower extremity swelling. No chest pain. He was offered an appointment, though he preferred to go to the ED. He was admitted on 4/30 with volume overload and Afib with RVR. It is unclear what his initial weight was as there has been some discrepancy with his weights. Upon his admission, he was noted to be in Afib with RVR with heart rates in the 130s bpm. He was started on IV amiodarone with plans for possible DCCV after he has been loaded. Troponin has peaked at 0.04. Potassium 3.0-->3.6, SCr 1.40-->1.69, Mg++ 2.0, BNP 4283, WBC 4.7, HGB 12.8. CXR stable. He remains in Afib with well controlled ventriclar rates. He is tolerating IV amiodarone without issues.    Past Medical History:  Diagnosis Date  . Atrial flutter ([place])    a. s/p TEE/DCCV 04/2017; b. CHADS2VASc => 3 (CHF, HTN, age x 1); c. not compliant with Xarelto  . Chronic combined systolic and diastolic CHF (congestive heart failure) ([place])    a. TTE 2/19: EF 20-25%, diffuse HK, mod dilated LA, mildly reduced RVSF, PASP 42; b.  TTE 3/19: EF 20-25%, diffuse HK, RVSF nl, atrial flutter with RVR  . Essential hypertension   . Pulmonary hypertension ([place])     Past Surgical History:  Procedure Laterality Date  . A-FLUTTER ABLATION N/A 06/16/2017   Procedure: A-FLUTTER ABLATION;  Surgeon: [name], MD;  Location: [place] CV LAB;  Service: Cardiovascular;  Laterality: N/A;  . CARDIOVERSION N/A 05/01/2017   Procedure: CARDIOVERSION;   Surgeon: [name], MD;  Location: [facility] ORS;  Service: Cardiovascular;  Laterality: N/A;  . TEE WITHOUT CARDIOVERSION N/A 05/01/2017   Procedure: TRANSESOPHAGEAL ECHOCARDIOGRAM (TEE);  Surgeon: [name], MD;  Location: [facility] ORS;  Service: Cardiovascular;  Laterality: N/A;  . TEE WITHOUT CARDIOVERSION N/A 06/16/2017   Procedure: TRANSESOPHAGEAL ECHOCARDIOGRAM (TEE);  Surgeon: [name], MD;  Location: [facility] ENDOSCOPY;  Service: Cardiovascular;  Laterality: N/A;     Home Meds: Prior to Admission medications   Medication Sig Start Date End Date Taking? Authorizing Provider  atorvastatin (LIPITOR) 40 MG tablet Take 1 tablet (40 mg total) by mouth daily at 6 PM. 06/17/17  Yes Baldwin Jamaica, PA-C  furosemide (LASIX) 40 MG tablet Take 1 tablet (40 mg total) by mouth daily. 06/17/17 12/14/17 Yes Baldwin Jamaica, PA-C  lisinopril (PRINIVIL,ZESTRIL) 20 MG tablet Take 1 tablet (20 mg total) by mouth daily. 06/17/17 12/14/17 Yes Baldwin Jamaica, PA-C  rivaroxaban (XARELTO) 20 MG TABS tablet Take 1 tablet (20 mg total) by mouth daily with supper. 06/17/17  Yes Baldwin Jamaica, PA-C  spironolactone (ALDACTONE) 25 MG tablet Take 1 tablet (25 mg total) by mouth daily. 07/11/17 10/09/17 Yes Deboraha Sprang, MD  amiodarone (PACERONE) 200 MG tablet Take 2 tablets (400 mg) by mouth TWICE daily x 2 weeks, then take 2 tablets (400 mg) by mouth ONCE daily x 2 weeks Patient not taking: Reported on 07/15/2017 07/10/17   Deboraha Sprang, MD  amiodarone (PACERONE) 200 MG tablet Take 1 tablet (200 mg total) by mouth daily. Patient not taking: Reported on 07/15/2017 07/10/17   Deboraha Sprang, MD    Inpatient Medications: Scheduled Meds: . atorvastatin  40 mg Oral q1800  . docusate sodium  100 mg Oral BID  . furosemide  20 mg Intravenous BID  . lisinopril  20 mg Oral Daily  . rivaroxaban  20 mg Oral Q supper  . sodium chloride flush  3 mL Intravenous Q12H  . sodium chloride flush  3 mL Intravenous Q12H   . spironolactone  25 mg Oral Daily   Continuous Infusions: . sodium chloride    . amiodarone 30 mg/hr (07/15/17 2356)   PRN Meds: sodium chloride, acetaminophen **OR** acetaminophen, HYDROcodone-acetaminophen, ondansetron **OR** ondansetron (ZOFRAN) IV, polyethylene glycol, sodium chloride flush  Allergies:  No Known Allergies  Social History:   Social History   Socioeconomic History  . Marital status: Single    Spouse name: Not on file  . Number of children: 1  . Years of education: Not on file  . Highest education level: Not on file  Occupational History  . Occupation: [employer]    Comment: Theatre manager  Social Needs  . Financial resource strain: Not hard at all  . Food insecurity:    Worry: Never true    Inability: Never true  . Transportation needs:    Medical: Yes    Non-medical: Yes  Tobacco Use  . Smoking status: Former Smoker    Last attempt to quit: 04/21/2017    Years since quitting: 0.2  .  Smokeless tobacco: Never Used  Substance and Sexual Activity  . Alcohol use: Yes    Alcohol/week: 1.8 oz    Types: 3 Cans of beer per week    Frequency: Never    Comment: Drink a half of a 40oz beer every day, drank heavily in the past  . Drug use: Never  . Sexual activity: Yes    Partners: Female  Lifestyle  . Physical activity:    Days per week: 7 days    Minutes per session: 20 min  . Stress: Not at all  Relationships  . Social connections:    Talks on phone: More than three times a week    Gets together: Once a week    Attends religious service: 1 to 4 times per year    Active member of club or organization: No    Attends meetings of clubs or organizations: Never    Relationship status: Divorced  . Intimate partner violence:    Fear of current or ex partner: No    Emotionally abused: No    Physically abused: No    Forced sexual activity: No  Other Topics Concern  . Not on file  Social History Narrative  . Not on file     Family History:   Family  History  Problem Relation Age of Onset  . Alzheimer's disease Mother   . Alzheimer's disease Father     ROS:  Review of Systems  Constitutional: Positive for malaise/fatigue. Negative for chills, diaphoresis, fever and weight loss.  HENT: Negative for congestion.   Eyes: Negative for discharge and redness.  Respiratory: Positive for shortness of breath. Negative for cough, hemoptysis, sputum production and wheezing.   Cardiovascular: Positive for leg swelling. Negative for chest pain, palpitations, orthopnea, claudication and PND.  Gastrointestinal: Positive for diarrhea and nausea. Negative for abdominal pain, blood in stool, constipation, heartburn, melena and vomiting.  Genitourinary: Negative for hematuria.  Musculoskeletal: Negative for falls and myalgias.  Skin: Negative for rash.  Neurological: Positive for weakness. Negative for dizziness, tingling, tremors, sensory change, speech change, focal weakness and loss of consciousness.  Endo/Heme/Allergies: Does not bruise/bleed easily.  Psychiatric/Behavioral: Negative for substance abuse. The patient is not nervous/anxious.   All other systems reviewed and are negative.     Physical Exam/Data:   Vitals:   07/15/17 1925 07/15/17 1950 07/16/17 0208 07/16/17 0401  BP:  (!) 127/93 (!) 122/103 (!) 148/94  Pulse: 70 73 63 61  Resp:  (!) 22 20 18   Temp:    98.3 F (36.8 C)  TempSrc:      SpO2: 100% 100% 100% 100%  Weight:  227 lb 1.6 oz (103 kg)  228 lb 6.4 oz (103.6 kg)  Height:  6\' 5"  (1.956 m)      Intake/Output Summary (Last 24 hours) at 07/16/2017 0730 Last data filed at 07/16/2017 0212 Gross per 24 hour  Intake 520.13 ml  Output 0 ml  Net 520.13 ml   Filed Weights   07/15/17 1453 07/15/17 1950 07/16/17 0401  Weight: 233 lb (105.7 kg) 227 lb 1.6 oz (103 kg) 228 lb 6.4 oz (103.6 kg)   Body mass index is 27.08 kg/m.   Physical Exam: General: Well developed, well nourished, in no acute distress. Head: Normocephalic,  atraumatic, sclera non-icteric, no xanthomas, nares without discharge.  Neck: Negative for carotid bruits. JVD mildly elevated. Lungs: Clear bilaterally to auscultation without wheezes, rales, or rhonchi. Breathing is unlabored. Heart: Irregularly irregular with S1 S2. No murmurs,  rubs, or gallops appreciated. Abdomen: Soft, non-tender, non-distended with normoactive bowel sounds. No hepatomegaly. No rebound/guarding. No obvious abdominal masses. Msk:  Strength and tone appear normal for age. Extremities: No clubbing or cyanosis. Trace lower extremity edema. Distal pedal pulses are 2+ and equal bilaterally. Neuro: Alert and oriented X 3. No facial asymmetry. No focal deficit. Moves all extremities spontaneously. Psych:  Responds to questions appropriately with a normal affect.   EKG:  The EKG was personally reviewed and demonstrates: Afib with RVR, 139 bpm, occasional PVCs, nonspecific st/t changes Telemetry:  Telemetry was personally reviewed and demonstrates: Afib, 80s bpm  Weights: Filed Weights   07/15/17 1453 07/15/17 1950 07/16/17 0401  Weight: 233 lb (105.7 kg) 227 lb 1.6 oz (103 kg) 228 lb 6.4 oz (103.6 kg)    Relevant CV Studies: Atrial flutter ablation 2017/07/05: Conclusion   CONCLUSIONS:  1. Isthmus-dependent right atrial flutter upon presentation.  2. Successful radiofrequency ablation of atrial flutter along the cavotricuspid isthmus with complete bidirectional isthmus block achieved.  3. No inducible arrhythmias following ablation except for spontaneous non-sustained atrial tachycardia..  4. No early apparent complications.     TEE 2017/07/05: Study Conclusions  - Left ventricle: The cavity size was mildly dilated. Systolic   function was normal. The estimated ejection fraction was in the   range of 15% to 20%. Diffuse hypokinesis. - Left atrium: The atrium was severely dilated. No evidence of   thrombus in the atrial cavity or appendage. No evidence of   thrombus  in the atrial cavity or appendage. No evidence of   thrombus in the appendage. - Right atrium: The atrium was severely dilated. No evidence of   thrombus in the atrial cavity or appendage. - Tricuspid valve: There was moderate-severe regurgitation. - Pulmonary arteries: PA peak pressure: 45 mm Hg (S).  Impressions:  - No cardiac source of emboli was indentified.  Laboratory Data:  Chemistry Recent Labs  Lab 07/10/17 1330 07/15/17 1455  NA 140 138  K 3.2* 3.0*  CL 103 104  CO2 25 24  GLUCOSE 114* 132*  BUN 17 35*  CREATININE 1.26* 1.40*  CALCIUM 9.0 8.7*  GFRNONAA 58* 51*  GFRAA >60 59*  ANIONGAP 12 10    No results for input(s): PROT, ALBUMIN, AST, ALT, ALKPHOS, BILITOT in the last 168 hours. Hematology Recent Labs  Lab 07/15/17 1455  WBC 4.7  RBC 4.28*  HGB 12.8*  HCT 38.1*  MCV 89.0  MCH 29.9  MCHC 33.6  RDW 16.6*  PLT 373   Cardiac Enzymes Recent Labs  Lab 07/15/17 1455 07/15/17 2254  TROPONINI <0.03 0.03*   No results for input(s): TROPIPOC in the last 168 hours.  BNP Recent Labs  Lab 07/15/17 1456  BNP 4,283.0*    DDimer No results for input(s): DDIMER in the last 168 hours.  Radiology/Studies:  Dg Chest 2 View  Result Date: 07/15/2017 IMPRESSION: Similar bronchitic changes and hyperinflation, possible COPD. Bibasilar atelectasis/scarring. Mild cardiomegaly. Aortic Atherosclerosis (ICD10-I70.0). Electronically Signed   By: Elon Alas M.D.   On: 07/15/2017 15:41    Assessment and Plan:   1. Afib with RVR: -Ventricular rates are well controlled currently -Continue loading with IV amiodarone for at least another 24-48 hours  -Plan for DCCV prior to discharge after he has been loaded with amiodarone -Restart home Toprol XL at a lower dose of 12.5 mg daily (unclear of his home compliance) -Continue Xarelto 20 mg q dinner (CrCl 75.92)  2. Acute on chronic systolic and  diastolic CHF due to presumed NICM/pulmonary hypertension: -He  continues to appear volume up -Continue gentle IV diuresis with Lasix 20 mg bid -Continue Toprol, lisinopril, and spironolactone  -CHF education -Daily weights, strict Is and Os  3. Atrial flutter: -Status post ablation 06/16/2017 -Medications as above  4. CKD stage III: -At his ~ baseline -Monitor with diuresis   5. Elevated troponin: -Troponin minimally elevated at 0.03 -Likely supply demand ischemia in the setting of volume overload, Afib with RVR, and CKD -Continue to cycle until trend or plateau noted  6. Hypokalemia: -Replete to goal > 4.0   For questions or updates, please contact [place] Please consult [email or web address] for contact info under Cardiology/STEMI.   Signed, Christell Faith, PA-C [facility] Pager: [phone] 07/16/2017, 7:30 AM

## 2017-07-17 DIAGNOSIS — I5023 Acute on chronic systolic (congestive) heart failure: Secondary | ICD-10-CM

## 2017-07-17 LAB — BASIC METABOLIC PANEL
Anion gap: 10 (ref 5–15)
BUN: 32 mg/dL — AB (ref 6–20)
CHLORIDE: 101 mmol/L (ref 101–111)
CO2: 27 mmol/L (ref 22–32)
CREATININE: 1.35 mg/dL — AB (ref 0.61–1.24)
Calcium: 9.2 mg/dL (ref 8.9–10.3)
GFR calc Af Amer: >60 mL/min (ref >60)
GFR calc non Af Amer: 53 mL/min — ABNORMAL LOW (ref >60)
Glucose, Bld: 115 mg/dL — ABNORMAL HIGH (ref 65–99)
Potassium: 3.6 mmol/L (ref 3.5–5.1)
Sodium: 138 mmol/L (ref 135–145)

## 2017-07-17 LAB — CBC
HCT: 42.1 % (ref 40.0–52.0)
HEMOGLOBIN: 13.9 g/dL (ref 13.0–18.0)
MCH: 29.5 pg (ref 26.0–34.0)
MCHC: 32.9 g/dL (ref 32.0–36.0)
MCV: 89.5 fL (ref 80.0–100.0)
PLATELETS: 346 10*3/uL (ref 150–440)
RBC: 4.71 MIL/uL (ref 4.40–5.90)
RDW: 16.7 % — ABNORMAL HIGH (ref 11.5–14.5)
WBC: 4.3 10*3/uL (ref 3.8–10.6)

## 2017-07-17 LAB — LACTIC ACID, PLASMA
LACTIC ACID, VENOUS: 2.6 mmol/L — AB (ref 0.5–1.9)
LACTIC ACID, VENOUS: 3.1 mmol/L — AB (ref 0.5–1.9)
Lactic Acid, Venous: 3.1 mmol/L (ref 0.5–1.9)

## 2017-07-17 LAB — HEPARIN LEVEL (UNFRACTIONATED)
Heparin Unfractionated: 1.73 IU/mL — ABNORMAL HIGH (ref 0.30–0.70)
Heparin Unfractionated: 0.95 IU/mL — ABNORMAL HIGH (ref 0.30–0.70)

## 2017-07-17 LAB — APTT
aPTT: 106 seconds — ABNORMAL HIGH (ref 24–36)
aPTT: 81 seconds — ABNORMAL HIGH (ref 24–36)

## 2017-07-17 MED ORDER — POTASSIUM CHLORIDE IN NACL 20-0.9 MEQ/L-% IV SOLN
INTRAVENOUS | Status: DC
  Filled 2017-07-17 (×2): qty 1000

## 2017-07-17 MED ORDER — HEPARIN (PORCINE) IN NACL 100-0.45 UNIT/ML-% IJ SOLN: 1150.0000 [IU]/h | INTRAMUSCULAR | Status: DC

## 2017-07-17 MED ORDER — POTASSIUM CHLORIDE 2 MEQ/ML IV SOLN: INTRAVENOUS | Status: DC

## 2017-07-17 MED ORDER — HEPARIN (PORCINE) IN NACL 100-0.45 UNIT/ML-% IJ SOLN
1450.0000 [IU]/h | INTRAMUSCULAR | Status: DC
  Administered 2017-07-18: 1450 [IU]/h via INTRAVENOUS
  Filled 2017-07-17: qty 250

## 2017-07-17 NOTE — H&P (View-Only) (Signed)
Progress Note  Patient Name: Nathan [name]. Date of Encounter: 07/17/2017  Primary Cardiologist: Virl Axe, MD  Subjective   Breathing much improved.  He says UO has been good, though net + I/O recorded.  Inpatient Medications    Scheduled Meds: . atorvastatin  40 mg Oral q1800  . docusate sodium  100 mg Oral BID  . furosemide  20 mg Intravenous BID  . lisinopril  20 mg Oral Daily  . metoprolol succinate  12.5 mg Oral Daily  . sodium chloride flush  3 mL Intravenous Q12H  . sodium chloride flush  3 mL Intravenous Q12H  . spironolactone  25 mg Oral Daily   Continuous Infusions: . sodium chloride    . amiodarone Stopped (07/17/17 0300)  . heparin 1,450 Units/hr (07/17/17 1141)   PRN Meds: sodium chloride, acetaminophen **OR** acetaminophen, HYDROcodone-acetaminophen, ondansetron **OR** ondansetron (ZOFRAN) IV, polyethylene glycol, sodium chloride flush   Vital Signs    Vitals:   07/16/17 1630 07/16/17 1947 07/17/17 0539 07/17/17 0825  BP: (!) 134/101 (!) 147/96 132/90 (!) 150/89  Pulse: 67 63 72 (!) 58  Resp: 18 20 17 14   Temp:  (!) 97.5 F (36.4 C) 98.6 F (37 C) (!) 97.5 F (36.4 C)  TempSrc:  Oral Oral Oral  SpO2: 100% 100% 100% 100%  Weight:   218 lb (98.9 kg)   Height:        Intake/Output Summary (Last 24 hours) at 07/17/2017 1328 Last data filed at 07/17/2017 1012 Gross per 24 hour  Intake 1035.31 ml  Output 1025 ml  Net 10.31 ml   Filed Weights   07/15/17 1950 07/16/17 0401 07/17/17 0539  Weight: 227 lb 1.6 oz (103 kg) 228 lb 6.4 oz (103.6 kg) 218 lb (98.9 kg)    Physical Exam   GEN: Well nourished, well developed, in no acute distress.  HEENT: Grossly normal.  Neck: Supple, no JVD, carotid bruits, or masses. Cardiac: RRR, no murmurs, rubs, or gallops. No clubbing, cyanosis, trace ankle edema bilat.  Radials/DP/PT 2+ and equal bilaterally.  Respiratory:  Respirations regular and unlabored, bibasilar crackles. GI: firm, protuberant, nontender,  BS + x 4. MS: no deformity or atrophy. Skin: warm and dry, no rash. Neuro:  Strength and sensation are intact. Psych: AAOx3.  Normal affect.  Labs    Chemistry Recent Labs  Lab 07/15/17 1455 07/16/17 0628 07/17/17 0912  NA 138 139 138  K 3.0* 3.6 3.6  CL 104 103 101  CO2 24 24 27   GLUCOSE 132* 97 115*  BUN 35* 34* 32*  CREATININE 1.40* 1.69* 1.35*  CALCIUM 8.7* 9.0 9.2  GFRNONAA 51* 41* 53*  GFRAA 59* 47* >60  ANIONGAP 10 12 10      Hematology Recent Labs  Lab 07/15/17 1455 07/17/17 0912  WBC 4.7 4.3  RBC 4.28* 4.71  HGB 12.8* 13.9  HCT 38.1* 42.1  MCV 89.0 89.5  MCH 29.9 29.5  MCHC 33.6 32.9  RDW 16.6* 16.7*  PLT 373 346    Cardiac Enzymes Recent Labs  Lab 07/15/17 1455 07/15/17 2254 07/16/17 0628 07/16/17 1052  TROPONINI <0.03 0.03* 0.04* 0.03*    BNP Recent Labs  Lab 07/15/17 1456  BNP 4,283.0*      Radiology    Dg Chest 2 View  Result Date: 07/15/2017 CLINICAL DATA:  Shortness of breath, dyspnea for 2 weeks. EXAM: CHEST - 2 VIEW COMPARISON:  Chest radiograph June 12, 2017 FINDINGS: Cardiac silhouette is mildly enlarged, mediastinal silhouette is normal. Mildly  calcified aortic arch. No pleural effusions or focal consolidations. Similar bronchitic changes. Similar bibasilar strandy densities with mild hyperexpansion. Slight RIGHT lung base pleural thickening. Trachea projects midline and there is no pneumothorax. Soft tissue planes and included osseous structures are non-suspicious. IMPRESSION: Similar bronchitic changes and hyperinflation, possible COPD. Bibasilar atelectasis/scarring. Mild cardiomegaly. Aortic Atherosclerosis (ICD10-I70.0). Electronically Signed   By: Elon Alas M.D.   On: 07/15/2017 15:41    Telemetry    Predominantly sinus rhythm with brief runs Afib/atach, freq PACs - Personally Reviewed  ECG    Repeat pending  Cardiac Studies   Transesophageal Echocardiogram 4.1.2019  Study Conclusions   - Left  ventricle: The cavity size was mildly dilated. Systolic   function was normal. The estimated ejection fraction was in the   range of 15% to 20%. Diffuse hypokinesis. - Left atrium: The atrium was severely dilated. No evidence of   thrombus in the atrial cavity or appendage. No evidence of   thrombus in the atrial cavity or appendage. No evidence of   thrombus in the appendage. - Right atrium: The atrium was severely dilated. No evidence of   thrombus in the atrial cavity or appendage. - Tricuspid valve: There was moderate-severe regurgitation. - Pulmonary arteries: PA peak pressure: 45 mm Hg (S).   Impressions:   - No cardiac source of emboli was indentified. _____________   Patient Profile     67 y.o. male w/ a h/o combined CHF, Aflutter s/p DCCV 04/2017, PAH, CKD III, and tobacco abuse, who was admitted 4/30 with worsening dyspnea, CHF, and ongoing Afib.  Assessment & Plan    1.  Acute on chronic combined syst/diastolic CHF/Cardiomyopathy (? ICM vs NICM): Pt found to have LV dysfxn in the setting of flutter w/ RVR in 04/2017.  EF 15-20% by TEE in 06/2017.  Admitted 4/30 with worsening dyspnea and CHF in setting of Afib RVR.  Was on IV amio and appears to have been in sinus on tele since yesterday.  Occas runs of slightly higher rates and PAF.  He reports good UO but I/O's indicate  +870 overnight, +1130 for admission, though wt is recorded as being down 10 lbs since 5/1.  He says he slept great last night and hasn't felt this good in some time.  Renal fxn stable.  I will f/u a 12 lead as he is regular on exam currently.  Volume looks pretty good but still w/ basilar crackles, some abd fullness, and trace lower ext edema.  No significant JVD.  Cont lasix IV today.  Cont  blocker, acei, spiro.  If renal fxn remains stable, we may wish to consider right and left heart cath tomorrow.  Last dose of xarelto was 4/29 or 30.  Will d/w Dr. Fletcher Anon.  2.  Persistent Afib w/ RVR: Converted to sinus on IV  amio yesterday with periods of PAF and wandering atrial pacemaker since.  F/u ECG today to confirm.  Feeling much better.  IV amio d/c'd in setting of slower rates.  Unfortunately, he did not tolerate oral amio and developed nausea and diarrhea after one dose.  Difficult situation.  We may wish to retrial. Will d/w Dr. Fletcher Anon.  3. Aflutter: s/p RFCA 06/16/2017.  4.  CKD III: stable.  Follow.  5.  Elevated troponin:  Flat trend.  Likely represents demand ischemia in the setting of rapid AFib and CHF.  Signed, Murray Hodgkins, NP  07/17/2017, 1:28 PM    For questions or updates, please contact  Please consult [email or web address] for contact info under Cardiology/STEMI.

## 2017-07-17 NOTE — Progress Notes (Signed)
Pt not started on ordered IV fluids after speaking to Dr Jerelyn Charles, verbal orders to discontinue IVF d/t pt EF 15-20%, and [id number].

## 2017-07-17 NOTE — Progress Notes (Signed)
Pt refusing bed alarm and chair alarm, educated pt. About risk of bleeding with heparin gtt, he agrees that he will call for assistance before getting up, however he is not comfortable having the alarms on.

## 2017-07-17 NOTE — Progress Notes (Addendum)
ANTICOAGULATION CONSULT NOTE - Initial Consult  Pharmacy Consult for heparin Indication: Xarelto PTA but acute on chronic renal insufficiency superimposed on CKD   No Known Allergies  Patient Measurements: Height: 6\' 5"  (195.6 cm) Weight: 218 lb (98.9 kg) IBW/kg (Calculated) : 89.1 Heparin Dosing Weight:   Vital Signs: Temp: 97.5 F (36.4 C) (05/02 0825) Temp Source: Oral (05/02 0825) BP: 150/89 (05/02 0825) Pulse Rate: 58 (05/02 0825)  Labs: Recent Labs    07/15/17 1455 07/15/17 1724 07/15/17 2254 07/16/17 0628 07/16/17 1052 07/16/17 1554 07/17/17 0912  HGB 12.8*  --   --   --   --   --  13.9  HCT 38.1*  --   --   --   --   --  42.1  PLT 373  --   --   --   --   --  346  APTT  --   --   --   --   --  43* 106*  LABPROT  --  35.5*  --   --   --  29.0*  --   INR  --  3.58  --   --   --  2.77  --   HEPARINUNFRC  --   --   --   --   --  3.18* 1.73*  CREATININE 1.40*  --   --  1.69*  --   --  1.35*  TROPONINI <0.03  --  0.03* 0.04* 0.03*  --   --     Estimated Creatinine Clearance: 67.8 mL/min (A) (by C-G formula based on SCr of 1.35 mg/dL (H)).   Medical History: Past Medical History:  Diagnosis Date  . Atrial flutter ([place])    a. s/p TEE/DCCV 04/2017; b. CHADS2VASc => 3 (CHF, HTN, age x 1); c. not compliant with Xarelto  . Chronic combined systolic and diastolic CHF (congestive heart failure) ([place])    a. TTE 2/19: EF 20-25%, diffuse HK, mod dilated LA, mildly reduced RVSF, PASP 42; b. TTE 3/19: EF 20-25%, diffuse HK, RVSF nl, atrial flutter with RVR  . Essential hypertension   . Pulmonary hypertension ([facility])     Medications:  Infusions:  . sodium chloride    . amiodarone Stopped (07/17/17 0300)  . heparin 1,450 Units/hr (07/17/17 1141)    Assessment: 67 yom cc SOB with PMH paroxysmal AF/flutter on Xarelto PTA, s/p ablation and cardioversion, EF 15 to 20% on echo from April 2019. ED finds HR 60s to 130s, BNP 4000, CXR with cardiomegaly/COPD. Plan is for  possible cardioversion, amiodarone infusion. Admitted for AF/RVR in acute on chronic mixed heart failure. SCr noted to increase and now pharmacy consulted to transition from Xarelto to heparin infusion due to worsening renal insufficiency in CKD.  Goal of Therapy:  Heparin level 0.3-0.7 units/ml aPTT 68 to 109 seconds Monitor platelets by anticoagulation protocol: Yes   Plan:  Give 5200 units bolus x 1 Start heparin infusion at 1450 units/hr Check baseline aPTT, INR, and HL; aPTT level in 6 hours and daily while on heparin until aPTT and HL correlate, per protocol Continue to monitor H&H and platelets  HL 1.73, aptt therapeutic at 106, HL not correlating due to xarelto use, will continue heparin iv 1450 units/hr. Check again in 6 hours. Patient has been refusing blood draws, Dr Jerelyn Charles going to investigate [facility] plan.   Vasily Fedewa, Pharm.D., BCGP Clinical Pharmacist 07/17/2017,1:36 PM

## 2017-07-17 NOTE — Progress Notes (Signed)
Progress Note  Patient Name: Nathan [name]. Date of Encounter: 07/17/2017  Primary Cardiologist: Virl Axe, MD  Subjective   Breathing much improved.  He says UO has been good, though net + I/O recorded.  Inpatient Medications    Scheduled Meds: . atorvastatin  40 mg Oral q1800  . docusate sodium  100 mg Oral BID  . furosemide  20 mg Intravenous BID  . lisinopril  20 mg Oral Daily  . metoprolol succinate  12.5 mg Oral Daily  . sodium chloride flush  3 mL Intravenous Q12H  . sodium chloride flush  3 mL Intravenous Q12H  . spironolactone  25 mg Oral Daily   Continuous Infusions: . sodium chloride    . amiodarone Stopped (07/17/17 0300)  . heparin 1,450 Units/hr (07/17/17 1141)   PRN Meds: sodium chloride, acetaminophen **OR** acetaminophen, HYDROcodone-acetaminophen, ondansetron **OR** ondansetron (ZOFRAN) IV, polyethylene glycol, sodium chloride flush   Vital Signs    Vitals:   07/16/17 1630 07/16/17 1947 07/17/17 0539 07/17/17 0825  BP: (!) 134/101 (!) 147/96 132/90 (!) 150/89  Pulse: 67 63 72 (!) 58  Resp: 18 20 17 14   Temp:  (!) 97.5 F (36.4 C) 98.6 F (37 C) (!) 97.5 F (36.4 C)  TempSrc:  Oral Oral Oral  SpO2: 100% 100% 100% 100%  Weight:   218 lb (98.9 kg)   Height:        Intake/Output Summary (Last 24 hours) at 07/17/2017 1328 Last data filed at 07/17/2017 1012 Gross per 24 hour  Intake 1035.31 ml  Output 1025 ml  Net 10.31 ml   Filed Weights   07/15/17 1950 07/16/17 0401 07/17/17 0539  Weight: 227 lb 1.6 oz (103 kg) 228 lb 6.4 oz (103.6 kg) 218 lb (98.9 kg)    Physical Exam   GEN: Well nourished, well developed, in no acute distress.  HEENT: Grossly normal.  Neck: Supple, no JVD, carotid bruits, or masses. Cardiac: RRR, no murmurs, rubs, or gallops. No clubbing, cyanosis, trace ankle edema bilat.  Radials/DP/PT 2+ and equal bilaterally.  Respiratory:  Respirations regular and unlabored, bibasilar crackles. GI: firm, protuberant, nontender,  BS + x 4. MS: no deformity or atrophy. Skin: warm and dry, no rash. Neuro:  Strength and sensation are intact. Psych: AAOx3.  Normal affect.  Labs    Chemistry Recent Labs  Lab 07/15/17 1455 07/16/17 0628 07/17/17 0912  NA 138 139 138  K 3.0* 3.6 3.6  CL 104 103 101  CO2 24 24 27   GLUCOSE 132* 97 115*  BUN 35* 34* 32*  CREATININE 1.40* 1.69* 1.35*  CALCIUM 8.7* 9.0 9.2  GFRNONAA 51* 41* 53*  GFRAA 59* 47* >60  ANIONGAP 10 12 10      Hematology Recent Labs  Lab 07/15/17 1455 07/17/17 0912  WBC 4.7 4.3  RBC 4.28* 4.71  HGB 12.8* 13.9  HCT 38.1* 42.1  MCV 89.0 89.5  MCH 29.9 29.5  MCHC 33.6 32.9  RDW 16.6* 16.7*  PLT 373 346    Cardiac Enzymes Recent Labs  Lab 07/15/17 1455 07/15/17 2254 07/16/17 0628 07/16/17 1052  TROPONINI <0.03 0.03* 0.04* 0.03*    BNP Recent Labs  Lab 07/15/17 1456  BNP 4,283.0*      Radiology    Dg Chest 2 View  Result Date: 07/15/2017 CLINICAL DATA:  Shortness of breath, dyspnea for 2 weeks. EXAM: CHEST - 2 VIEW COMPARISON:  Chest radiograph June 12, 2017 FINDINGS: Cardiac silhouette is mildly enlarged, mediastinal silhouette is normal. Mildly  calcified aortic arch. No pleural effusions or focal consolidations. Similar bronchitic changes. Similar bibasilar strandy densities with mild hyperexpansion. Slight RIGHT lung base pleural thickening. Trachea projects midline and there is no pneumothorax. Soft tissue planes and included osseous structures are non-suspicious. IMPRESSION: Similar bronchitic changes and hyperinflation, possible COPD. Bibasilar atelectasis/scarring. Mild cardiomegaly. Aortic Atherosclerosis (ICD10-I70.0). Electronically Signed   By: Elon Alas M.D.   On: 07/15/2017 15:41    Telemetry    Predominantly sinus rhythm with brief runs Afib/atach, freq PACs - Personally Reviewed  ECG    Repeat pending  Cardiac Studies   Transesophageal Echocardiogram 4.1.2019  Study Conclusions   - Left  ventricle: The cavity size was mildly dilated. Systolic   function was normal. The estimated ejection fraction was in the   range of 15% to 20%. Diffuse hypokinesis. - Left atrium: The atrium was severely dilated. No evidence of   thrombus in the atrial cavity or appendage. No evidence of   thrombus in the atrial cavity or appendage. No evidence of   thrombus in the appendage. - Right atrium: The atrium was severely dilated. No evidence of   thrombus in the atrial cavity or appendage. - Tricuspid valve: There was moderate-severe regurgitation. - Pulmonary arteries: PA peak pressure: 45 mm Hg (S).   Impressions:   - No cardiac source of emboli was indentified. _____________   Patient Profile     67 y.o. male w/ a h/o combined CHF, Aflutter s/p DCCV 04/2017, PAH, CKD III, and tobacco abuse, who was admitted 4/30 with worsening dyspnea, CHF, and ongoing Afib.  Assessment & Plan    1.  Acute on chronic combined syst/diastolic CHF/Cardiomyopathy (? ICM vs NICM): Pt found to have LV dysfxn in the setting of flutter w/ RVR in 04/2017.  EF 15-20% by TEE in 06/2017.  Admitted 4/30 with worsening dyspnea and CHF in setting of Afib RVR.  Was on IV amio and appears to have been in sinus on tele since yesterday.  Occas runs of slightly higher rates and PAF.  He reports good UO but I/O's indicate  +870 overnight, +1130 for admission, though wt is recorded as being down 10 lbs since 5/1.  He says he slept great last night and hasn't felt this good in some time.  Renal fxn stable.  I will f/u a 12 lead as he is regular on exam currently.  Volume looks pretty good but still w/ basilar crackles, some abd fullness, and trace lower ext edema.  No significant JVD.  Cont lasix IV today.  Cont  blocker, acei, spiro.  If renal fxn remains stable, we may wish to consider right and left heart cath tomorrow.  Last dose of xarelto was 4/29 or 30.  Will d/w Dr. Fletcher Anon.  2.  Persistent Afib w/ RVR: Converted to sinus on IV  amio yesterday with periods of PAF and wandering atrial pacemaker since.  F/u ECG today to confirm.  Feeling much better.  IV amio d/c'd in setting of slower rates.  Unfortunately, he did not tolerate oral amio and developed nausea and diarrhea after one dose.  Difficult situation.  We may wish to retrial. Will d/w Dr. Fletcher Anon.  3. Aflutter: s/p RFCA 06/16/2017.  4.  CKD III: stable.  Follow.  5.  Elevated troponin:  Flat trend.  Likely represents demand ischemia in the setting of rapid AFib and CHF.  Signed, Murray Hodgkins, NP  07/17/2017, 1:28 PM    For questions or updates, please contact  Please consult [email or web address] for contact info under Cardiology/STEMI.

## 2017-07-17 NOTE — Progress Notes (Signed)
[facility] [place] NAME: Nathan [name]    MR#:  [id number]  DATE OF BIRTH:  1950-12-17  SUBJECTIVE:  CHIEF COMPLAINT:   Chief Complaint  Patient presents with  . Shortness of Breath  Patient feeling better, no complaints  REVIEW OF SYSTEMS:  CONSTITUTIONAL: No fever, fatigue or weakness.  EYES: No blurred or double vision.  EARS, NOSE, AND THROAT: No tinnitus or ear pain.  RESPIRATORY: No cough, shortness of breath, wheezing or hemoptysis.  CARDIOVASCULAR: No chest pain, orthopnea, edema.  GASTROINTESTINAL: No nausea, vomiting, diarrhea or abdominal pain.  GENITOURINARY: No dysuria, hematuria.  ENDOCRINE: No polyuria, nocturia,  HEMATOLOGY: No anemia, easy bruising or bleeding SKIN: No rash or lesion. MUSCULOSKELETAL: No joint pain or arthritis.   NEUROLOGIC: No tingling, numbness, weakness.  PSYCHIATRY: No anxiety or depression.   ROS  DRUG ALLERGIES:  No Known Allergies  VITALS:  Blood pressure (!) 150/89, pulse (!) 58, temperature (!) 97.5 F (36.4 C), temperature source Oral, resp. rate 14, height 6\' 5"  (1.956 m), weight 98.9 kg (218 lb), SpO2 100 %.  PHYSICAL EXAMINATION:  GENERAL:  67 y.o.-year-old patient lying in the bed with no acute distress.  EYES: Pupils equal, round, reactive to light and accommodation. No scleral icterus. Extraocular muscles intact.  HEENT: Head atraumatic, normocephalic. Oropharynx and nasopharynx clear.  NECK:  Supple, no jugular venous distention. No thyroid enlargement, no tenderness.  LUNGS: Normal breath sounds bilaterally, no wheezing, rales,rhonchi or crepitation. No use of accessory muscles of respiration.  CARDIOVASCULAR: S1, S2 normal. No murmurs, rubs, or gallops.  ABDOMEN: Soft, nontender, nondistended. Bowel sounds present. No organomegaly or mass.  EXTREMITIES: No pedal edema, cyanosis, or clubbing.  NEUROLOGIC: Cranial nerves II through XII are intact. Muscle strength 5/5 in all  extremities. Sensation intact. Gait not checked.  PSYCHIATRIC: The patient is alert and oriented x 3.  SKIN: No obvious rash, lesion, or ulcer.   Physical Exam LABORATORY PANEL:   CBC Recent Labs  Lab 07/17/17 0912  WBC 4.3  HGB 13.9  HCT 42.1  PLT 346   ------------------------------------------------------------------------------------------------------------------  Chemistries  Recent Labs  Lab 07/15/17 2254  07/17/17 0912  NA  --    < > 138  K  --    < > 3.6  CL  --    < > 101  CO2  --    < > 27  GLUCOSE  --    < > 115*  BUN  --    < > 32*  CREATININE  --    < > 1.35*  CALCIUM  --    < > 9.2  MG 2.0  --   --    < > = values in this interval not displayed.   ------------------------------------------------------------------------------------------------------------------  Cardiac Enzymes Recent Labs  Lab 07/16/17 0628 07/16/17 1052  TROPONINI 0.04* 0.03*   ------------------------------------------------------------------------------------------------------------------  RADIOLOGY:  Dg Chest 2 View  Result Date: 07/15/2017 CLINICAL DATA:  Shortness of breath, dyspnea for 2 weeks. EXAM: CHEST - 2 VIEW COMPARISON:  Chest radiograph June 12, 2017 FINDINGS: Cardiac silhouette is mildly enlarged, mediastinal silhouette is normal. Mildly calcified aortic arch. No pleural effusions or focal consolidations. Similar bronchitic changes. Similar bibasilar strandy densities with mild hyperexpansion. Slight RIGHT lung base pleural thickening. Trachea projects midline and there is no pneumothorax. Soft tissue planes and included osseous structures are non-suspicious. IMPRESSION: Similar bronchitic changes and hyperinflation, possible COPD. Bibasilar atelectasis/scarring. Mild cardiomegaly. Aortic Atherosclerosis (ICD10-I70.0). Electronically Signed  By: Elon Alas M.D.   On: 07/15/2017 15:41    ASSESSMENT AND PLAN:  * Acute on chronic systolic and diastolic heart  failure in setting of atrial fibrillation with RVR Resolved Continue amiodarone, Toprol-XL, statin therapy, Lasix, and await further cardiology recommendations  *Acute A. fib with RVR  Stable  Plan of care as stated above   *Acute hypokalemia Repleted  *Chronic benign essential hypertension Stable on current regiment   All the records are reviewed and case discussed with Care Management/Social Workerr. Management plans discussed with the patient, family and they are in agreement.  CODE STATUS:full  TOTAL TIME TAKING CARE OF THIS PATIENT: 45 minutes.     POSSIBLE D/C IN 1-3 DAYS, DEPENDING ON CLINICAL CONDITION.   Avel Peace Casmira Cramer M.D on 07/17/2017   Between 7am to 6pm - Pager - [phone]  After 6pm go to [email or web address] - password [email or web address] [facility]  Office  [phone]  CC: Primary care physician; Patient, [name]  Note: This dictation was prepared with Dragon dictation along with smaller phrase technology. Any transcriptional errors that result from this process are unintentional.

## 2017-07-17 NOTE — Progress Notes (Signed)
Cardiovascular and Pulmonary Nurse Navigator Note  67 year old male admitted with acute on chronic systolic and diastolic HF in setting of atrial fibrillation/flutter with RVR.  S/p DCCV on 04/2017 and ablation for atrial flutter on 06/16/2017.   Patient with PAH, CKD III, history of tobacco abuse.    Patient currently on heparin gtt. Xarelto being held.    CHF Education / Re-Education: Provided patient with "Living Better with Heart Failure" packet. Briefly reviewed definition of heart failure and signs and symptoms of an exacerbation. Discussed the meaning of the EF and what his EF is compared to the normal value.  Explained to patient HF is a chronic illness which must be self assessed / self-managed along with the help of your Cardiologist and HF Clinic in order to keep you functioning in your home and to prevent ER visits.  We also discussed how a-fib / a flutter can affect heart failure.    *Reviewed importance of and reason behind checking weight daily in the AM, after using the bathroom, but before getting dressed. Patient informed this RN that she used to weigh herself every morning, but she just got out of the habit of doing this. Patient reports that he has scales.    *Discussed when to call the Dr= weight gain of >-2-3lb overnight or 5lb in a week,  *Discussed yellow zone= call MD: weight gain of >2-3lb overnight or 5lb in a week, increased swelling, increased SOB when lying down, chest discomfort, dizziness, increased fatigue *Red Zone= call 911: struggle to breath, fainting or near fainting, significant chest pain   *Reviewed low sodium diet-provided handout of recommended and not recommended foods. Reviewed reading labels with patient. Discussed fluid intake with patient as well.  Patient currently on  fluid restriction of 2000 ml of fluids per day.  Patient asked me about drinking beer.  This RN discouraged drinking alcoholic beverages and told the patient that he must count the volume in  his 2000 ml of fluids per 24 hours and he must also look at the sodium content of the beverage. Patient verbalized understanding.   Note:  Patient works at [employer] -  a [employer].  Patient has worked there for years.  Patient works on Mondays; off Tues, [place], and Thursday; works Fridays 7 a.m. - 11 a.m. And works all day on Saturdays.  *Instructed patient to take medications as prescribed for heart failure. Explained briefly why pt is on the medications (either make you feel better, live longer or keep you out of the hospital) and discussed monitoring and side effects.   *Smoking Cessation - Patient reported that he quit smoking four months ago.    *Exercise - Patient informed this RN that he is active and does not sit around.  Patient works on Mondays; off Tues, [place], and Thursday; works Fridays 7 a.m. - 11 a.m. And works all day on Saturdays.   Follow-ups: [facility] - Patient is followed in the [facility].  Next appointment is Aug 05, 2017.   *Patient has an appointment to follow-up with Dr. Rockey Situ on Jul 22, 2017.     Roanna Epley, RN, BSN, [facility]  Cardiovascular & Pulmonary Nurse Navigator  Direct Line: [phone]  Department Phone #: [phone] Fax: [phone]  Email Address: [email or web address]

## 2017-07-17 NOTE — Progress Notes (Signed)
CRITICAL VALUE ALERT  Critical Value:  3.1 Lactic acid   Date & Time Notied:  07/17/2017 13:50  Provider Notified: Salary  Orders Received/Actions taken: no new orders at this time

## 2017-07-18 ENCOUNTER — Encounter
Admission: EM | Disposition: A | Discharge status: Home / Self Care | Payer: Self-pay | Source: Home / Self Care | Attending: Family Medicine

## 2017-07-18 HISTORY — PX: RIGHT/LEFT HEART CATH AND CORONARY ANGIOGRAPHY: CATH118266

## 2017-07-18 LAB — BASIC METABOLIC PANEL
ANION GAP: 8 (ref 5–15)
BUN: 28 mg/dL — ABNORMAL HIGH (ref 6–20)
CHLORIDE: 100 mmol/L — AB (ref 101–111)
CO2: 29 mmol/L (ref 22–32)
Calcium: 8.7 mg/dL — ABNORMAL LOW (ref 8.9–10.3)
Creatinine, Ser: 1.45 mg/dL — ABNORMAL HIGH (ref 0.61–1.24)
GFR calc non Af Amer: 49 mL/min — ABNORMAL LOW (ref >60)
GFR, EST AFRICAN AMERICAN: 56 mL/min — AB (ref >60)
Glucose, Bld: 80 mg/dL (ref 65–99)
Potassium: 3.4 mmol/L — ABNORMAL LOW (ref 3.5–5.1)
SODIUM: 137 mmol/L (ref 135–145)

## 2017-07-18 LAB — CBC
HCT: 40.4 % (ref 40.0–52.0)
Hemoglobin: 13.3 g/dL (ref 13.0–18.0)
MCH: 29.5 pg (ref 26.0–34.0)
MCHC: 32.8 g/dL (ref 32.0–36.0)
MCV: 90.1 fL (ref 80.0–100.0)
PLATELETS: 325 10*3/uL (ref 150–440)
RBC: 4.49 MIL/uL (ref 4.40–5.90)
RDW: 17.6 % — AB (ref 11.5–14.5)
WBC: 4.5 10*3/uL (ref 3.8–10.6)

## 2017-07-18 LAB — APTT: APTT: 96 s — AB (ref 24–36)

## 2017-07-18 LAB — HEPARIN LEVEL (UNFRACTIONATED): HEPARIN UNFRACTIONATED: 0.93 [IU]/mL — AB (ref 0.30–0.70)

## 2017-07-18 SURGERY — RIGHT/LEFT HEART CATH AND CORONARY ANGIOGRAPHY
Anesthesia: Moderate Sedation

## 2017-07-18 MED ORDER — RIVAROXABAN 20 MG PO TABS: 20.0000 mg | ORAL_TABLET | Freq: Every day | ORAL | Status: DC

## 2017-07-18 MED ORDER — HEPARIN SODIUM (PORCINE) 1000 UNIT/ML IJ SOLN
INTRAMUSCULAR | Status: DC | PRN
  Administered 2017-07-18: 3000 [IU] via INTRAVENOUS

## 2017-07-18 MED ORDER — SODIUM CHLORIDE 0.9% FLUSH
3.0000 mL | Freq: Two times a day (BID) | INTRAVENOUS | Status: DC
  Administered 2017-07-18: 3 mL via INTRAVENOUS

## 2017-07-18 MED ORDER — MIDAZOLAM HCL 2 MG/2ML IJ SOLN
INTRAMUSCULAR | Status: DC | PRN
  Administered 2017-07-18: 1 mg via INTRAVENOUS

## 2017-07-18 MED ORDER — SODIUM CHLORIDE 0.9 % IV SOLN
250.0000 mL | INTRAVENOUS | Status: DC | PRN
  Administered 2017-07-18: 10 mL via INTRAVENOUS

## 2017-07-18 MED ORDER — ASPIRIN 81 MG PO CHEW: 81.0000 mg | CHEWABLE_TABLET | ORAL | Status: DC

## 2017-07-18 MED ORDER — FENTANYL CITRATE (PF) 100 MCG/2ML IJ SOLN
INTRAMUSCULAR | Status: AC
  Filled 2017-07-18: qty 2

## 2017-07-18 MED ORDER — ASPIRIN 81 MG PO CHEW
81.0000 mg | CHEWABLE_TABLET | ORAL | Status: AC
  Administered 2017-07-18: 81 mg via ORAL
  Filled 2017-07-18: qty 1

## 2017-07-18 MED ORDER — SODIUM CHLORIDE 0.9% FLUSH
3.0000 mL | Freq: Two times a day (BID) | INTRAVENOUS | Status: DC
  Administered 2017-07-18 – 2017-07-19 (×2): 3 mL via INTRAVENOUS

## 2017-07-18 MED ORDER — SODIUM CHLORIDE 0.9 % IV SOLN: INTRAVENOUS | Status: DC

## 2017-07-18 MED ORDER — SODIUM CHLORIDE 0.9 % IV SOLN: 250.0000 mL | INTRAVENOUS | Status: DC | PRN

## 2017-07-18 MED ORDER — HEPARIN (PORCINE) IN NACL 1000-0.9 UT/500ML-% IV SOLN
INTRAVENOUS | Status: AC
  Filled 2017-07-18: qty 1000

## 2017-07-18 MED ORDER — MIDAZOLAM HCL 2 MG/2ML IJ SOLN
INTRAMUSCULAR | Status: AC
  Filled 2017-07-18: qty 2

## 2017-07-18 MED ORDER — FUROSEMIDE 10 MG/ML IJ SOLN
40.0000 mg | Freq: Two times a day (BID) | INTRAMUSCULAR | Status: DC
  Administered 2017-07-18 – 2017-07-19 (×2): 40 mg via INTRAVENOUS
  Filled 2017-07-18 (×2): qty 4

## 2017-07-18 MED ORDER — SODIUM CHLORIDE 0.9% FLUSH: 3.0000 mL | INTRAVENOUS | Status: DC | PRN

## 2017-07-18 MED ORDER — VERAPAMIL HCL 2.5 MG/ML IV SOLN
INTRAVENOUS | Status: AC
  Filled 2017-07-18: qty 2

## 2017-07-18 MED ORDER — FENTANYL CITRATE (PF) 100 MCG/2ML IJ SOLN
INTRAMUSCULAR | Status: DC | PRN
  Administered 2017-07-18: 50 ug via INTRAVENOUS

## 2017-07-18 MED ORDER — LIDOCAINE HCL (PF) 1 % IJ SOLN
INTRAMUSCULAR | Status: AC
  Filled 2017-07-18: qty 30

## 2017-07-18 MED ORDER — HEPARIN SODIUM (PORCINE) 1000 UNIT/ML IJ SOLN
INTRAMUSCULAR | Status: AC
  Filled 2017-07-18: qty 1

## 2017-07-18 SURGICAL SUPPLY — 10 items
CATH OPTITORQUE JACKY 4.0 5F (CATHETERS) ×2 IMPLANT
CATH SWANZ 7F THERMO (CATHETERS) ×2 IMPLANT
DEVICE RAD COMP TR BAND LRG (VASCULAR PRODUCTS) ×2 IMPLANT
KIT MANI 3VAL PERCEP (MISCELLANEOUS) ×2 IMPLANT
KIT RIGHT HEART (MISCELLANEOUS) ×2 IMPLANT
NEEDLE PERC 21GX4CM (NEEDLE) ×2 IMPLANT
PACK CARDIAC CATH (CUSTOM PROCEDURE TRAY) ×2 IMPLANT
SHEATH RAIN 4/5FR (SHEATH) ×2 IMPLANT
SHEATH RAIN RADIAL 21G 6FR (SHEATH) ×2 IMPLANT
WIRE ROSEN-J .035X260CM (WIRE) ×2 IMPLANT

## 2017-07-18 NOTE — Care Management Important Message (Signed)
Copy of signed IM left in patient's room.    

## 2017-07-18 NOTE — Progress Notes (Signed)
ANTICOAGULATION CONSULT NOTE - Initial Consult  Pharmacy Consult for heparin Indication: Xarelto PTA but acute on chronic renal insufficiency superimposed on CKD   No Known Allergies  Patient Measurements: Height: 6\' 5"  (195.6 cm) Weight: 218 lb (98.9 kg) IBW/kg (Calculated) : 89.1 Heparin Dosing Weight:   Vital Signs: Temp: 97.4 F (36.3 C) (05/02 1929) Temp Source: Oral (05/02 1929) BP: 122/76 (05/02 1929) Pulse Rate: 58 (05/02 1929)  Labs: Recent Labs    07/15/17 1455 07/15/17 1724 07/15/17 2254 07/16/17 0628 07/16/17 1052 07/16/17 1554 07/17/17 0912 07/17/17 1952  HGB 12.8*  --   --   --   --   --  13.9  --   HCT 38.1*  --   --   --   --   --  42.1  --   PLT 373  --   --   --   --   --  346  --   APTT  --   --   --   --   --  43* 106* 81*  LABPROT  --  35.5*  --   --   --  29.0*  --   --   INR  --  3.58  --   --   --  2.77  --   --   HEPARINUNFRC  --   --   --   --   --  3.18* 1.73* 0.95*  CREATININE 1.40*  --   --  1.69*  --   --  1.35*  --   TROPONINI <0.03  --  0.03* 0.04* 0.03*  --   --   --     Estimated Creatinine Clearance: 67.8 mL/min (A) (by C-G formula based on SCr of 1.35 mg/dL (H)).   Medical History: Past Medical History:  Diagnosis Date  . Atrial flutter ([place])    a. s/p TEE/DCCV 04/2017; b. CHADS2VASc => 3 (CHF, HTN, age x 1); c. not compliant with Xarelto  . Chronic combined systolic and diastolic CHF (congestive heart failure) ([place])    a. TTE 2/19: EF 20-25%, diffuse HK, mod dilated LA, mildly reduced RVSF, PASP 42; b. TTE 3/19: EF 20-25%, diffuse HK, RVSF nl, atrial flutter with RVR  . Essential hypertension   . Pulmonary hypertension (HCC)     Medications:  Infusions:  . sodium chloride    . amiodarone Stopped (07/17/17 0300)  . heparin      Assessment: 67 yom cc SOB with PMH paroxysmal AF/flutter on Xarelto PTA, s/p ablation and cardioversion, EF 15 to 20% on echo from April 2019. ED finds HR 60s to 130s, BNP 4000, CXR with  cardiomegaly/COPD. Plan is for possible cardioversion, amiodarone infusion. Admitted for AF/RVR in acute on chronic mixed heart failure. SCr noted to increase and now pharmacy consulted to transition from Xarelto to heparin infusion due to worsening renal insufficiency in CKD.  Goal of Therapy:  Heparin level 0.3-0.7 units/ml aPTT 68 to 109 seconds Monitor platelets by anticoagulation protocol: Yes   Plan:  Give 5200 units bolus x 1 Start heparin infusion at 1450 units/hr Check baseline aPTT, INR, and HL; aPTT level in 6 hours and daily while on heparin until aPTT and HL correlate, per protocol Continue to monitor H&H and platelets  HL 1.73, aptt therapeutic at 106, HL not correlating due to xarelto use, will continue heparin iv 1450 units/hr. Check again in 6 hours. Patient has been refusing blood draws, Dr Jerelyn Charles going to investigate [facility] plan.  5/2 2000 aPTT 81, heparin level 0.95. Continue current regimen. Recheck heparin level, CBC, aPTT with AM labs.  Maleko Greulich S, Pharm.D., BCGP Clinical Pharmacist 07/18/2017,12:02 AM

## 2017-07-18 NOTE — Interval H&P Note (Signed)
History and Physical Interval Note:  07/18/2017 12:46 PM  Nathan [name].  has presented today for surgery, with the diagnosis of cardiomyopathy/chf  The various methods of treatment have been discussed with the patient and family. After consideration of risks, benefits and other options for treatment, the patient has consented to  Procedure(s): RIGHT/LEFT HEART CATH AND CORONARY ANGIOGRAPHY (N/A) as a surgical intervention .  The patient's history has been reviewed, patient examined, no change in status, stable for surgery.  I have reviewed the patient's chart and labs.  Questions were answered to the patient's satisfaction.     Kathlyn Sacramento

## 2017-07-18 NOTE — Plan of Care (Signed)
  Problem: Education: Goal: Knowledge of disease or condition will improve Outcome: Progressing   Problem: Education: Goal: Understanding of CV disease, CV risk reduction, and recovery process will improve Outcome: Progressing   Problem: Activity: Goal: Ability to return to baseline activity level will improve Outcome: Progressing   Problem: Cardiovascular: Goal: Vascular access site(s) Level 0-1 will be maintained Outcome: Progressing

## 2017-07-18 NOTE — Progress Notes (Signed)
TR band deflated, no bleeding nor hematoma at site. Denies complaints. Vitals stable, dressing applied to site.

## 2017-07-18 NOTE — Progress Notes (Signed)
Patient clinically stable post heart cath with Dr Fletcher Anon, right radial site without bleeding nor hematoma, 39ml/ air instilled. Denies complaints.sinus rhythm per monitor. Eating lunch.

## 2017-07-18 NOTE — Progress Notes (Signed)
[place] at [place] NAME: Nathan [name]    MR#:  [id number]  DATE OF BIRTH:  [birth year]  SUBJECTIVE:  CHIEF COMPLAINT:   Chief Complaint  Patient presents with  . Shortness of Breath  no complaints  REVIEW OF SYSTEMS:  CONSTITUTIONAL: No fever, fatigue or weakness.  EYES: No blurred or double vision.  EARS, NOSE, AND THROAT: No tinnitus or ear pain.  RESPIRATORY: No cough, shortness of breath, wheezing or hemoptysis.  CARDIOVASCULAR: No chest pain, orthopnea, edema.  GASTROINTESTINAL: No nausea, vomiting, diarrhea or abdominal pain.  GENITOURINARY: No dysuria, hematuria.  ENDOCRINE: No polyuria, nocturia,  HEMATOLOGY: No anemia, easy bruising or bleeding SKIN: No rash or lesion. MUSCULOSKELETAL: No joint pain or arthritis.   NEUROLOGIC: No tingling, numbness, weakness.  PSYCHIATRY: No anxiety or depression.   ROS  DRUG ALLERGIES:  No Known Allergies  VITALS:  Blood pressure (!) 144/77, pulse (!) 49, temperature 97.7 F (36.5 C), temperature source Oral, resp. rate 20, height 6\' 5"  (1.956 m), weight 102.9 kg (226 lb 14.4 oz), SpO2 97 %.  PHYSICAL EXAMINATION:  GENERAL:  67 y.o.-year-old patient lying in the bed with no acute distress.  EYES: Pupils equal, round, reactive to light and accommodation. No scleral icterus. Extraocular muscles intact.  HEENT: Head atraumatic, normocephalic. Oropharynx and nasopharynx clear.  NECK:  Supple, no jugular venous distention. No thyroid enlargement, no tenderness.  LUNGS: Normal breath sounds bilaterally, no wheezing, rales,rhonchi or crepitation. No use of accessory muscles of respiration.  CARDIOVASCULAR: S1, S2 normal. No murmurs, rubs, or gallops.  ABDOMEN: Soft, nontender, nondistended. Bowel sounds present. No organomegaly or mass.  EXTREMITIES: No pedal edema, cyanosis, or clubbing.  NEUROLOGIC: Cranial nerves II through XII are intact. Muscle strength 5/5 in all extremities. Sensation  intact. Gait not checked.  PSYCHIATRIC: The patient is alert and oriented x 3.  SKIN: No obvious rash, lesion, or ulcer.   Physical Exam LABORATORY PANEL:   CBC Recent Labs  Lab 07/18/17 1042  WBC 4.5  HGB 13.3  HCT 40.4  PLT 325   ------------------------------------------------------------------------------------------------------------------  Chemistries  Recent Labs  Lab 07/15/17 2254  07/18/17 1042  NA  --    < > 137  K  --    < > 3.4*  CL  --    < > 100*  CO2  --    < > 29  GLUCOSE  --    < > 80  BUN  --    < > 28*  CREATININE  --    < > 1.45*  CALCIUM  --    < > 8.7*  MG 2.0  --   --    < > = values in this interval not displayed.   ------------------------------------------------------------------------------------------------------------------  Cardiac Enzymes Recent Labs  Lab 07/16/17 0628 07/16/17 1052  TROPONINI 0.04* 0.03*   ------------------------------------------------------------------------------------------------------------------  RADIOLOGY:  No results found.  ASSESSMENT AND PLAN:  *Acute on chronic systolic and diastolic heart failure in setting of atrial fibrillation with RVR Resolved Continue amiodarone, Toprol-XL, statin therapy, Lasix, for cardiac cath today, and await further cardiology recommendations  *Acute A. fib with RVR  Stable  Plan of care as stated above   *Acute hypokalemia Repleted  *Chronic benign essential hypertension Stable on current regiment   All the records are reviewed and case discussed with Care Management/Social Workerr. Management plans discussed with the patient, family and they are in agreement.  CODE STATUS: full  TOTAL TIME TAKING CARE  OF THIS PATIENT: 35 minutes.     POSSIBLE D/C IN 35 DAYS, DEPENDING ON CLINICAL CONDITION.   Avel Peace Salary M.D on 07/18/2017   Between 7am to 6pm - Pager - [phone]  After 6pm go to [email or web address] - password [email or web address]  [facility]  Office  [phone]  CC: Primary care physician; Patient, [name]  Note: This dictation was prepared with Dragon dictation along with smaller phrase technology. Any transcriptional errors that result from this process are unintentional.

## 2017-07-18 NOTE — Progress Notes (Signed)
HR increases to 140's non-sustained then back down to 60-70's. Pt. Asymptomatic. Will continue to monitor pt.

## 2017-07-18 NOTE — Progress Notes (Signed)
ANTICOAGULATION CONSULT NOTE - Consult  Pharmacy Consult for heparin Indication: Xarelto PTA but acute on chronic renal insufficiency superimposed on CKD   No Known Allergies  Patient Measurements: Height: 6\' 5"  (195.6 cm) Weight: 226 lb 14.4 oz (102.9 kg) IBW/kg (Calculated) : 89.1 Heparin Dosing Weight:   Vital Signs: Temp: 97.7 F (36.5 C) (05/03 0745) Temp Source: Oral (05/03 1205) BP: 144/77 (05/03 1430) Pulse Rate: 49 (05/03 0745)  Labs: Recent Labs    07/15/17 1724 07/15/17 2254 07/16/17 0628 07/16/17 1052  07/16/17 1554 07/17/17 0912 07/17/17 1952 07/18/17 1042  HGB  --   --   --   --   --   --  13.9  --  13.3  HCT  --   --   --   --   --   --  42.1  --  40.4  PLT  --   --   --   --   --   --  346  --  325  APTT  --   --   --   --    < > 43* 106* 81* 96*  LABPROT 35.5*  --   --   --   --  29.0*  --   --   --   INR 3.58  --   --   --   --  2.77  --   --   --   HEPARINUNFRC  --   --   --   --    < > 3.18* 1.73* 0.95* 0.93*  CREATININE  --   --  1.69*  --   --   --  1.35*  --  1.45*  TROPONINI  --  0.03* 0.04* 0.03*  --   --   --   --   --    < > = values in this interval not displayed.    Estimated Creatinine Clearance: 63.2 mL/min (A) (by C-G formula based on SCr of 1.45 mg/dL (H)).   Medical History: Past Medical History:  Diagnosis Date  . Atrial flutter ([place])    a. s/p TEE/DCCV 04/2017; b. CHADS2VASc => 3 (CHF, HTN, age x 1); c. not compliant with Xarelto  . Chronic combined systolic and diastolic CHF (congestive heart failure) ([place])    a. TTE 2/19: EF 20-25%, diffuse HK, mod dilated LA, mildly reduced RVSF, PASP 42; b. TTE 3/19: EF 20-25%, diffuse HK, RVSF nl, atrial flutter with RVR  . Essential hypertension   . Pulmonary hypertension (HCC)     Medications:  Infusions:  . [MAR Hold] sodium chloride    . sodium chloride 10 mL (07/18/17 1215)  . [START ON 07/19/2017] sodium chloride    . amiodarone Stopped (07/17/17 0300)  . heparin Stopped  (07/18/17 1210)    Assessment: 67 yom cc SOB with PMH paroxysmal AF/flutter on Xarelto PTA, s/p ablation and cardioversion, EF 15 to 20% on echo from April 2019. ED finds HR 60s to 130s, BNP 4000, CXR with cardiomegaly/COPD. Plan is for possible cardioversion, amiodarone infusion. Admitted for AF/RVR in acute on chronic mixed heart failure. SCr noted to increase and now pharmacy consulted to transition from Xarelto to heparin infusion due to worsening renal insufficiency in CKD.  Goal of Therapy:  Heparin level 0.3-0.7 units/ml aPTT 68 to 109 seconds Monitor platelets by anticoagulation protocol: Yes   Plan:  Give 5200 units bolus x 1 Start heparin infusion at 1450 units/hr Check baseline aPTT, INR, and HL; aPTT level in  6 hours and daily while on heparin until aPTT and HL correlate, per protocol Continue to monitor H&H and platelets  HL 1.73, aptt therapeutic at 106, HL not correlating due to xarelto use, will continue heparin iv 1450 units/hr. Check again in 6 hours. Patient has been refusing blood draws, Dr Jerelyn Charles going to investigate [facility] plan.   5/2 2000 aPTT 81, heparin level 0.95. Continue current regimen. Recheck heparin level, CBC, aPTT with AM labs.  5/3 1040 aPTT 96, HL 0.93 - aPTT within goal range, pt apparently refusing labs this AM. Pt now in cath - follow up heparin plan after cath Will need to see if heparin resumed and order next level  Rayna Sexton, PharmD, BCPS Clinical Pharmacist 07/18/2017 3:32 PM

## 2017-07-19 DIAGNOSIS — I420 Dilated cardiomyopathy: Secondary | ICD-10-CM

## 2017-07-19 DIAGNOSIS — I480 Paroxysmal atrial fibrillation: Principal | ICD-10-CM

## 2017-07-19 DIAGNOSIS — I10 Essential (primary) hypertension: Secondary | ICD-10-CM

## 2017-07-19 LAB — POTASSIUM: Potassium: 3.3 mmol/L — ABNORMAL LOW (ref 3.5–5.1)

## 2017-07-19 LAB — BASIC METABOLIC PANEL
ANION GAP: 6 (ref 5–15)
BUN: 26 mg/dL — ABNORMAL HIGH (ref 6–20)
CALCIUM: 8.1 mg/dL — AB (ref 8.9–10.3)
CO2: 29 mmol/L (ref 22–32)
Chloride: 103 mmol/L (ref 101–111)
Creatinine, Ser: 1.16 mg/dL (ref 0.61–1.24)
GFR calc Af Amer: >60 mL/min (ref >60)
GLUCOSE: 122 mg/dL — AB (ref 65–99)
POTASSIUM: 3 mmol/L — AB (ref 3.5–5.1)
Sodium: 138 mmol/L (ref 135–145)

## 2017-07-19 LAB — CBC
HCT: 35.2 % — ABNORMAL LOW (ref 40.0–52.0)
HEMOGLOBIN: 11.9 g/dL — AB (ref 13.0–18.0)
MCH: 29.9 pg (ref 26.0–34.0)
MCHC: 33.9 g/dL (ref 32.0–36.0)
MCV: 88.4 fL (ref 80.0–100.0)
Platelets: 295 10*3/uL (ref 150–440)
RBC: 3.98 MIL/uL — AB (ref 4.40–5.90)
RDW: 17.1 % — ABNORMAL HIGH (ref 11.5–14.5)
WBC: 3.7 10*3/uL — ABNORMAL LOW (ref 3.8–10.6)

## 2017-07-19 LAB — MAGNESIUM
MAGNESIUM: 1.4 mg/dL — AB (ref 1.7–2.4)
MAGNESIUM: 1.8 mg/dL (ref 1.7–2.4)

## 2017-07-19 MED ORDER — METOPROLOL SUCCINATE ER 25 MG PO TB24: 25.0000 mg | ORAL_TABLET | Freq: Two times a day (BID) | ORAL | 0 refills | Status: DC

## 2017-07-19 MED ORDER — POTASSIUM CHLORIDE CRYS ER 20 MEQ PO TBCR
40.0000 meq | EXTENDED_RELEASE_TABLET | Freq: Once | ORAL | Status: AC
  Administered 2017-07-19: 40 meq via ORAL
  Filled 2017-07-19: qty 2

## 2017-07-19 MED ORDER — MAGNESIUM SULFATE 2 GM/50ML IV SOLN
2.0000 g | Freq: Once | INTRAVENOUS | Status: AC
  Administered 2017-07-19: 2 g via INTRAVENOUS
  Filled 2017-07-19: qty 50

## 2017-07-19 MED ORDER — FUROSEMIDE 40 MG PO TABS: 40.0000 mg | ORAL_TABLET | Freq: Two times a day (BID) | ORAL | 1 refills | Status: DC

## 2017-07-19 MED ORDER — METOPROLOL SUCCINATE ER 25 MG PO TB24: 12.5000 mg | ORAL_TABLET | Freq: Every day | ORAL | 1 refills | Status: DC

## 2017-07-19 MED ORDER — POTASSIUM CHLORIDE ER 20 MEQ PO TBCR: 20.0000 meq | EXTENDED_RELEASE_TABLET | Freq: Two times a day (BID) | ORAL | 1 refills | Status: DC

## 2017-07-19 MED ORDER — LOSARTAN POTASSIUM 100 MG PO TABS: 100.0000 mg | ORAL_TABLET | Freq: Every day | ORAL | 1 refills | Status: DC

## 2017-07-19 NOTE — Progress Notes (Signed)
Progress Note  Patient Name: Nathan [name]. Date of Encounter: 07/19/2017  Primary Cardiologist: Virl Axe, MD  Subjective   Insisting on going home today Has not ambulated very much  reports that his breathing feels much better  Reports compliance with his medications Trying to do better with his fluid intake, cut down on his beers  Telemetry reviewed showing frequent episodes of atrial tachycardia also periodic short episodes of nonsustained VT PVCs,  He is asymptomatic from his arrhythmia Cardiac catheterization results discussed with him In detail  Inpatient Medications    Scheduled Meds: . atorvastatin  40 mg Oral q1800  . docusate sodium  100 mg Oral BID  . furosemide  40 mg Intravenous BID  . metoprolol succinate  12.5 mg Oral Daily  . rivaroxaban  20 mg Oral Q supper  . sodium chloride flush  3 mL Intravenous Q12H  . sodium chloride flush  3 mL Intravenous Q12H  . sodium chloride flush  3 mL Intravenous Q12H  . spironolactone  25 mg Oral Daily   Continuous Infusions: . sodium chloride    . amiodarone Stopped (07/17/17 0300)   PRN Meds: sodium chloride, acetaminophen **OR** acetaminophen, HYDROcodone-acetaminophen, ondansetron **OR** ondansetron (ZOFRAN) IV, polyethylene glycol, sodium chloride flush, sodium chloride flush   Vital Signs    Vitals:   07/18/17 1935 07/19/17 0351 07/19/17 0802 07/19/17 1120  BP: 131/66 106/78 (!) 131/96 (!) 137/91  Pulse: 80 66 99 70  Resp: 18 16 16    Temp: 98 F (36.7 C) 98.3 F (36.8 C)    TempSrc: Oral Oral    SpO2: 95% 96% 100%   Weight:  225 lb 4.8 oz (102.2 kg)    Height:        Intake/Output Summary (Last 24 hours) at 07/19/2017 1456 Last data filed at 07/19/2017 1342 Gross per 24 hour  Intake 1316.04 ml  Output 3120 ml  Net -1803.96 ml   Filed Weights   07/17/17 0539 07/18/17 0349 07/19/17 0351  Weight: 218 lb (98.9 kg) 226 lb 14.4 oz (102.9 kg) 225 lb 4.8 oz (102.2 kg)    Physical Exam    Constitutional:  oriented to person, place, and time. No distress.  HENT:  Head: Normocephalic and atraumatic.  Eyes:  no discharge. No scleral icterus.  Neck: Normal range of motion. Neck supple. + 10+ JVD present.  Cardiovascular: Normal rate, regular rhythm, normal heart sounds and intact distal pulses. Exam reveals no gallop and no friction rub. No edema No murmur heard. Pulmonary/Chest: Effort normal and breath sounds normal. No stridor. No respiratory distress.  no wheezes.  no rales.  no tenderness.  Abdominal: Soft.  no distension.  no tenderness.  Musculoskeletal: Normal range of motion.  no  tenderness or deformity.  Neurological:  normal muscle tone. Coordination normal. No atrophy Skin: Skin is warm and dry. No rash noted. not diaphoretic.  Psychiatric:  normal mood and affect. behavior is normal. Thought content normal.      Labs    Chemistry Recent Labs  Lab 07/17/17 0912 07/18/17 1042 07/19/17 0530  NA 138 137 138  K 3.6 3.4* 3.0*  CL 101 100* 103  CO2 27 29 29   GLUCOSE 115* 80 122*  BUN 32* 28* 26*  CREATININE 1.35* 1.45* 1.16  CALCIUM 9.2 8.7* 8.1*  GFRNONAA 53* 49* >60  GFRAA >60 56* >60  ANIONGAP 10 8 6      Hematology Recent Labs  Lab 07/17/17 0912 07/18/17 1042 07/19/17 0530  WBC 4.3  4.5 3.7*  RBC 4.71 4.49 3.98*  HGB 13.9 13.3 11.9*  HCT 42.1 40.4 35.2*  MCV 89.5 90.1 88.4  MCH 29.5 29.5 29.9  MCHC 32.9 32.8 33.9  RDW 16.7* 17.6* 17.1*  PLT 346 325 295    Cardiac Enzymes Recent Labs  Lab 07/15/17 1455 07/15/17 2254 07/16/17 0628 07/16/17 1052  TROPONINI <0.03 0.03* 0.04* 0.03*    BNP Recent Labs  Lab 07/15/17 1456  BNP 4,283.0*      Radiology    No results found.  Telemetry    Predominantly sinus rhythm with brief runs Afib/atach, freq PACs - Personally Reviewed  ECG     Echocardiogram March 2019 Left ventricle: The cavity size was mildly dilated. Systolic   function was severely reduced. The estimated  ejection fraction   was <20% Diffuse hypokinesis. Regional wall motion abnormalities   cannot be excluded. The study is not technically sufficient to   allow evaluation of LV diastolic function. - Mitral valve: There was mild regurgitation. - Left atrium: The atrium was mildly dilated. - Right ventricle: Poorly visualized. Systolic function was normal. - Pulmonary arteries: Systolic pressure could not be accurately   estimated.  - Rhythm concerning for atrial flutter, rate 130 bpm s/p   cardioversion 05/01/2017, now back in atrial flutter.    Cardiac Studies   Transesophageal Echocardiogram 4.1.2019  Study Conclusions   - Left ventricle: The cavity size was mildly dilated. Systolic   function was normal. The estimated ejection fraction was in the   range of 15% to 20%. Diffuse hypokinesis. - Left atrium: The atrium was severely dilated. No evidence of   thrombus in the atrial cavity or appendage. No evidence of   thrombus in the atrial cavity or appendage. No evidence of   thrombus in the appendage. - Right atrium: The atrium was severely dilated. No evidence of   thrombus in the atrial cavity or appendage. - Tricuspid valve: There was moderate-severe regurgitation. - Pulmonary arteries: PA peak pressure: 45 mm Hg (S).   Impressions:   - No cardiac source of emboli was indentified. _____________   Patient Profile     67 y.o. male w/ a h/o combined CHF, Aflutter s/p DCCV 04/2017, PAH, CKD III, and tobacco abuse, who was admitted 4/30 with worsening dyspnea, CHF, and ongoing Afib.  Assessment & Plan    1.  Acute on chronic combined syst/diastolic CHF/Cardiomyopathy (NICM):  LV dysfxn in the setting of flutter w/ RVR in 04/2017.   EF 15-20% by TEE in 06/2017.   Admitted worsening dyspnea and CHF in setting of Afib RVR.   Converted to normal sinus rhythm on IV amiodarone Cont lasix IV today, though he prefers to go home this afternoon. --Increase metoprolol up to 25 twice  daily given significant tachycardia and nonsustained VT --Transition to losartan, continue Arlyce Harman.  --Lasix 40 twice daily with potassium 20 twice daily  2.  Persistent Afib w/ RVR:  Converted to sinus on IV amio  Frequent episodes of atrial tachycardia, nonsustained VT also noted  Increase metoprolol up to 25 twice daily, succinate Monitor for bradycardia  3. Aflutter: s/p RFCA 06/16/2017. Monitor as outpatient  4.  CKD III: stable.  Lasix 40 twice daily with potassium 20 twice daily, close outpatient follow-up for lab work  Long discussion with him about whether to stay today for further medical management, he is insisting on going home.  Stressed the importance of low fluid and salt intake  Total encounter time more than 35  minutes  Greater than 50% was spent in counseling and coordination of care with the patient   Signed, Ida Rogue, MD  07/19/2017, 2:56 PM    For questions or updates, please contact   Please consult [email or web address] for contact info under Cardiology/STEMI.

## 2017-07-19 NOTE — Progress Notes (Signed)
[name] was admitted to the Hospital on 07/15/2017 and Discharged  07/19/2017 and should be excused from work/school   for 6  days starting 07/15/2017  To 07/20/2017 , may return to work/school without any restrictions.  Call Saundra Shelling  MD with questions.  Saundra Shelling M.D on 07/19/2017,at 2:10 PM  [name]  [phone]

## 2017-07-19 NOTE — Progress Notes (Signed)
K+ 3.0 this A.M. Prime text result, awaiting response. Will inform day nurse.

## 2017-07-19 NOTE — Progress Notes (Signed)
Cardiology Office Note  Date:  07/22/2017   ID:  Nathan [name]., DOB 1951/02/14, MRN [id number]  PCP:  Patient, [name]   Chief Complaint  Patient presents with  . other    F/u heart rhythm/edema ankles; pt wants to discuss medications and clear what medications wants to discuss what he should be taking. Meds reviewed verbally with pt.    HPI:  67 y.o.malewith history of  tobacco abuse quit 04/28/17 Admission to the hospital 04/2017 with new onset atrial flutter with RVR,  elevated troponin,  acutecombinedsystolic and diastolic CHF S/p TEE and cardioversion 05/01/2017 Who presents for follow up of his cardiomyopathy, atrial flutter after d/c Recent hospitalization for atrial flutter, converted with IV amiodarone  Had cardiac catheterization showing nonobstructive disease Insisted on leaving the hospital 07/19/2017 despite markedly elevated left ventricular end-diastolic pressures Discharged in normal sinus rhythm Lasix up to 40 twice a day with potassium 20 twice a day On losartan and metoprolol succinate 25 twice a day  On today's visit he only has his Lasix spironolactone and Xarelto Reports the other medications were not given to him " Nurse messed up" Indicated to him that the prescriptions were likely sent electronically  he did not go to [facility] to pick them up.  He is complaining about lower extremity edema " need ibuprofen for gout", but does not have any pain .thinks the ankle swelling is from gout.    EKG reviewed with him in detail showing  Normal sinus rhythm rate 83 bpm with paroxysmal atrial tachycardia,  APCs   rate up to 100  Other past medical history reviewed  atrial flutter with RVR with 2:1 AV block with heart rates in the 120s bpm Despite advnacing meds He was refusing  metoprolol  -Echo showed EF 20-25% (done while tachycardic), left atrium 54 mm -CHADS2VASC at least least 2 (CHF, age x 1)  Successful TEE cardioversion  D/c on  anticoagulation Wanted to leave AMA He did not want to stay in the hospital for ischemic workup  Elevated pressures obtained through echocardiogram, TEE D/c  on Lasix 40 daily    PMH:   has a past medical history of Atrial flutter ([place]), Chronic combined systolic and diastolic CHF (congestive heart failure) ([place]), Essential hypertension, and Pulmonary hypertension ([place]).  PSH:    Past Surgical History:  Procedure Laterality Date  . A-FLUTTER ABLATION N/A 06/16/2017   Procedure: A-FLUTTER ABLATION;  Surgeon: [name], MD;  Location: [place] [facility];  Service: Cardiovascular;  Laterality: N/A;  . CARDIOVERSION N/A 05/01/2017   Procedure: CARDIOVERSION;  Surgeon: [name], MD;  Location: [facility];  Service: Cardiovascular;  Laterality: N/A;  . RIGHT/LEFT HEART CATH AND CORONARY ANGIOGRAPHY N/A 07/18/2017   Procedure: RIGHT/LEFT HEART CATH AND CORONARY ANGIOGRAPHY;  Surgeon: [name], MD;  Location: [place] [facility];  Service: Cardiovascular;  Laterality: N/A;  . TEE WITHOUT CARDIOVERSION N/A 05/01/2017   Procedure: TRANSESOPHAGEAL ECHOCARDIOGRAM (TEE);  Surgeon: [name], MD;  Location: [facility];  Service: Cardiovascular;  Laterality: N/A;  . TEE WITHOUT CARDIOVERSION N/A 06/16/2017   Procedure: TRANSESOPHAGEAL ECHOCARDIOGRAM (TEE);  Surgeon: [name], MD;  Location: [facility];  Service: Cardiovascular;  Laterality: N/A;    Current Outpatient Medications  Medication Sig Dispense Refill  . furosemide (LASIX) 40 MG tablet Take 1 tablet (40 mg total) by mouth 2 (two) times daily. 60 tablet 1  . rivaroxaban (XARELTO) 20 MG TABS tablet Take 1 tablet (20 mg  total) by mouth daily with supper. 30 tablet 6  . spironolactone (ALDACTONE) 25 MG tablet Take 1 tablet (25 mg total) by mouth daily. 30 tablet 6  . atorvastatin (LIPITOR) 40 MG tablet Take 1 tablet (40 mg total) by mouth daily at 6 PM. (Patient not taking: Reported on 07/22/2017) 30 tablet 6  .  losartan (COZAAR) 100 MG tablet Take 1 tablet (100 mg total) by mouth daily. (Patient not taking: Reported on 07/22/2017) 30 tablet 1  . metoprolol succinate (TOPROL-XL) 25 MG 24 hr tablet Take 1 tablet (25 mg total) by mouth 2 (two) times daily. (Patient not taking: Reported on 07/22/2017) 60 tablet 1  . Potassium Chloride ER 20 MEQ TBCR Take 20 mEq by mouth 2 (two) times daily. (Patient not taking: Reported on 07/22/2017) 60 tablet 1   No current facility-administered medications for this visit.      Allergies:   Patient has no known allergies.   Social History:  The patient  reports that he quit smoking about 3 months ago. He has never used smokeless tobacco. He reports that he drinks about 1.8 oz of alcohol per week. He reports that he does not use drugs.   Family History:   family history includes Alzheimer's disease in his father and mother.    Review of Systems: Review of Systems  Constitutional: Negative.   Respiratory: Negative.   Cardiovascular: Positive for leg swelling.  Gastrointestinal: Negative.   Musculoskeletal: Negative.   Neurological: Negative.   Psychiatric/Behavioral: Negative.   All other systems reviewed and are negative.    PHYSICAL EXAM: VS:  BP 140/70 (BP Location: Left Arm, Patient Position: Sitting, Cuff Size: Large)   Pulse (!) 101   Ht 6\' 5"  (1.956 m)   Wt 231 lb 12 oz (105.1 kg)   BMI 27.48 kg/m  , BMI Body mass index is 27.48 kg/m. Constitutional:  oriented to person, place, and time. No distress.  HENT:  Head: Normocephalic and atraumatic.  Eyes:  no discharge. No scleral icterus.  Neck: Normal range of motion. Neck supple. No JVD present.  Cardiovascular: Normal rate, regular rhythm, mixed with tachycardia, regular, normal heart sounds and intact distal pulses. Exam reveals no gallop and no friction rub. Trace ankle edema pitting No murmur heard. Pulmonary/Chest: Effort normal and breath sounds normal. No stridor. No respiratory distress.  no  wheezes.  no rales.  no tenderness.  Abdominal: Soft.  no distension.  no tenderness.  Musculoskeletal: Normal range of motion.  no  tenderness or deformity.  Neurological:  normal muscle tone. Coordination normal. No atrophy Skin: Skin is warm and dry. No rash noted. not diaphoretic.  Psychiatric:  normal mood and affect. behavior is normal. Thought content normal.    Recent Labs: 04/29/2017: ALT 61 06/15/2017: TSH 4.001 07/15/2017: B Natriuretic Peptide 4,283.0 07/19/2017: BUN 26; Creatinine, Ser 1.16; Hemoglobin 11.9; Magnesium 1.8; Platelets 295; Potassium 3.3; Sodium 138    Lipid Panel Lab Results  Component Value Date   CHOL 157 05/01/2017   HDL 44 05/01/2017   LDLCALC 85 05/01/2017   TRIG 138 05/01/2017      Wt Readings from Last 3 Encounters:  07/22/17 231 lb 12 oz (105.1 kg)  07/19/17 225 lb 4.8 oz (102.2 kg)  07/10/17 233 lb 8 oz (105.9 kg)       ASSESSMENT AND PLAN:  Atrial flutter with rapid ventricular response (HCC) - Plan: EKG 12-Lead sinus rhythm with episodes of atrial tachycardia continue Xarelto 1 a day discussed the importance  of staying on his medications E will restart his metoprolol succinate 25 twice a day, losartan, potassium and continue the spironolactone Xarelto Lasix twice a day  Ischemic cardiomyopathy - Plan: EKG 12-Lead Medications restarted as above  Pulmonary HTN ([place]) - Plan: EKG 12-Lead Markedly elevated ventricular end-diastolic pressure on recent cardiac catheterization He wanted to leave the hospital before we had adequate diuresis now with ankle swelling Recommended compliance with his Lasix twice a day and recommended he minimize his fluid intake Beta blocker for rate and rhythmcontrol  Acute on chronic combined systolic and diastolic CHF (congestive heart failure) ([place]) - Plan: EKG 12-Lead problems obtaining and staying on his medications We have called the pharmacy and refills will be sent in   Total encounter time more than  45 minutes  Greater than 50% was spent in counseling and coordination of care with the patient   Disposition:   F/U  3 months   Orders Placed This Encounter  Procedures  . EKG 12-Lead     Signed, Esmond Plants, M.D., Ph.D. 07/22/2017  [place], [place]

## 2017-07-19 NOTE — Progress Notes (Signed)
Pt. Slept well throughout the night with no c/o pain. HR ran from mid 50's to up to 150's unsustained throughout the night.

## 2017-07-19 NOTE — Discharge Summary (Signed)
[facility] [place] NAME: Rollins Wrightson    MR#:  [id number]  DATE OF BIRTH:  1950/07/08  DATE OF ADMISSION:  07/15/2017 ADMITTING PHYSICIAN: Avel Peace Salary, MD  DATE OF DISCHARGE: No discharge date for patient encounter.  PRIMARY CARE PHYSICIAN: Patient, [name]   ADMISSION DIAGNOSIS:  Acute on chronic systolic heart failure (HCC) [I50.23] Paroxysmal atrial fibrillation (HCC) [I48.0]  Acute hypokalemia Hypertension  DISCHARGE DIAGNOSIS:  1.  Systolic heart failure exacerbation resolved 2 atrial fibrillation.  3.  Hypokalemia 4.  Hypomagnesemia  SECONDARY DIAGNOSIS:   Past Medical History:  Diagnosis Date  . Atrial flutter ([place])    a. s/p TEE/DCCV 04/2017; b. CHADS2VASc => 3 (CHF, HTN, age x 1); c. not compliant with Xarelto  . Chronic combined systolic and diastolic CHF (congestive heart failure) ([place])    a. TTE 2/19: EF 20-25%, diffuse HK, mod dilated LA, mildly reduced RVSF, PASP 42; b. TTE 3/19: EF 20-25%, diffuse HK, RVSF nl, atrial flutter with RVR  . Essential hypertension   . Pulmonary hypertension ([place])      ADMITTING HISTORY Mahki Spikes  is a 67 y.o. male with a known history per below which also includes paroxysmal A. fib/a flutter, status post ablation/cardioversions in the past, noted ejection fraction 15 to 20% on echo done earlier this month, on Xarelto, presenting to the emergency room for 2 3 months of worsening shortness of breath, dyspnea on exertion, in the emergency room patient was found to have heart rate ranging from the 60s to 130s range per ED attending, potassium of 3, BNP greater than 4000, chest x-ray noted for cardiomegaly/COPD, and discussion with ED attending-plan is for admission with possible cardioversion on tomorrow, amiodarone drip was started, patient evaluated in the emergency room, no apparent distress, resting comfortably in bed, patient is now being admitted for acute on chronic systolic  congestive heart failure exacerbation due to paroxysmal A. Fib   HOSPITAL COURSE:  Patient admitted to telemetry.  He was evaluated by [facility] health cardiology in the hospital.  Patient was started on IV amiodarone drip during the hospitalization for rate control.  He was also started on oral metoprolol, continued statin therapy and was diuresed with IV Lasix.  His potassium and magnesium were repleted.  Patient does not want to take amiodarone because of nausea. Cardiac catheterization 1.  Heavily calcified coronary arteries with mild nonobstructive disease.  Left dominant system. 2.  Left ventricular angiography was not performed. 3.  Right heart catheterization showed moderately to severely elevated filling pressures, severe pulmonary hypertension and severely reduced cardiac output. Patient shortness of breath has resolved.  He was diuresed with IV Lasix with increased dose of 40 mg twice daily.  Patient will be discharged home on oral Lasix 40 mg twice daily along with increased metoprolol dose of 25 mg twice a day.  He will be started on oral Entresto by cardiology in the office.  He will be discharged on oral losartan and will continue diuresis with Aldactone.  Case was discussed with Dr. Rockey Situ of [facility] health cardiology prior to discharge of the patient.  Patient will continue anticoagulation with oral Xarelto and continue statin medication .patient hemodynamically stable will be discharged home and follow-up with cardiology in the clinic.    CONSULTS OBTAINED:  Treatment Team:  Nelva Bush, MD Salary, Avel Peace, MD  DRUG ALLERGIES:  No Known Allergies  DISCHARGE MEDICATIONS:   Allergies as of 07/19/2017   No  Known Allergies     Medication List    STOP taking these medications   amiodarone 200 MG tablet Commonly known as:  PACERONE   lisinopril 20 MG tablet Commonly known as:  PRINIVIL,ZESTRIL     TAKE these medications   atorvastatin 40 MG tablet Commonly known as:   LIPITOR Take 1 tablet (40 mg total) by mouth daily at 6 PM.   furosemide 40 MG tablet Commonly known as:  LASIX Take 1 tablet (40 mg total) by mouth 2 (two) times daily. What changed:  when to take this   losartan 100 MG tablet Commonly known as:  COZAAR Take 1 tablet (100 mg total) by mouth daily.   metoprolol succinate 25 MG 24 hr tablet Commonly known as:  TOPROL-XL Take 1 tablet (25 mg total) by mouth 2 (two) times daily.   Potassium Chloride ER 20 MEQ Tbcr Take 20 mEq by mouth 2 (two) times daily.   rivaroxaban 20 MG Tabs tablet Commonly known as:  XARELTO Take 1 tablet (20 mg total) by mouth daily with supper.   spironolactone 25 MG tablet Commonly known as:  ALDACTONE Take 1 tablet (25 mg total) by mouth daily.       Today  Patient seen and evaluated today No chest pain No shortness of breath No palpitations Potassium replaced orally Magnesium replaced intravenously  VITAL SIGNS:  Blood pressure (!) 137/91, pulse 70, temperature 98.3 F (36.8 C), temperature source Oral, resp. rate 16, height 6\' 5"  (1.956 m), weight 102.2 kg (225 lb 4.8 oz), SpO2 100 %.  I/O:    Intake/Output Summary (Last 24 hours) at 07/19/2017 1411 Last data filed at 07/19/2017 1342 Gross per 24 hour  Intake 1316.04 ml  Output 3120 ml  Net -1803.96 ml    PHYSICAL EXAMINATION:  Physical Exam  GENERAL:  67 y.o.-year-old patient lying in the bed with no acute distress.  LUNGS: Normal breath sounds bilaterally, no wheezing, rales,rhonchi or crepitation. No use of accessory muscles of respiration.  CARDIOVASCULAR: S1, S2 normal. No murmurs, rubs, or gallops.  ABDOMEN: Soft, non-tender, non-distended. Bowel sounds present. No organomegaly or mass.  NEUROLOGIC: Moves all 4 extremities. PSYCHIATRIC: The patient is alert and oriented x 3.  SKIN: No obvious rash, lesion, or ulcer.   DATA REVIEW:   CBC Recent Labs  Lab 07/19/17 0530  WBC 3.7*  HGB 11.9*  HCT 35.2*  PLT 295     Chemistries  Recent Labs  Lab 07/19/17 0530  NA 138  K 3.0*  CL 103  CO2 29  GLUCOSE 122*  BUN 26*  CREATININE 1.16  CALCIUM 8.1*  MG 1.4*    Cardiac Enzymes Recent Labs  Lab 07/16/17 1052  TROPONINI 0.03*    Microbiology Results  Results for orders placed or performed during the hospital encounter of 04/29/17  MRSA PCR Screening     Status: None   Collection Time: 04/29/17 11:36 PM  Result Value Ref Range Status   MRSA by PCR NEGATIVE NEGATIVE Final    Comment:        The GeneXpert MRSA Assay (FDA approved for NASAL specimens only), is one component of a comprehensive MRSA colonization surveillance program. It is not intended to diagnose MRSA infection nor to guide or monitor treatment for MRSA infections. Performed at [facility], [address], [place], [place] [address]     RADIOLOGY:  No results found.  Follow up with PCP in 1 week.  Management plans discussed with the patient, family  and they are in agreement.  CODE STATUS:     Code Status Orders  (From admission, onward)        Start     Ordered   07/15/17 1956  Full code  Continuous     07/15/17 1955    Code Status History    Date Active Date Inactive Code Status Order ID Comments User Context   06/16/2017 1915 06/17/2017 1850 Full Code [id number]  [name], MD Inpatient   06/15/2017 1702 06/16/2017 1915 Full Code [id number]  Daune Perch, NP Inpatient   06/12/2017 2142 06/15/2017 1523 Full Code [id number]  Demetrios Loll, MD Inpatient   04/29/2017 2333 05/01/2017 1920 Full Code [id number]  Lance Coon, MD Inpatient      TOTAL TIME TAKING CARE OF THIS PATIENT ON DAY OF DISCHARGE: more than 35 minutes.   Saundra Shelling M.D on 07/19/2017 at 2:11 PM  Between 7am to 6pm - Pager - [phone]  After 6pm go to [email or web address] - password [email or web address] [facility]  Office  [phone]  CC: Primary care physician; Patient, [name]  Note: This dictation was  prepared with Dragon dictation along with smaller phrase technology. Any transcriptional errors that result from this process are unintentional.

## 2017-07-21 ENCOUNTER — Encounter: Payer: Self-pay | Admitting: Cardiovascular Disease

## 2017-07-21 ENCOUNTER — Telehealth: Payer: Self-pay | Admitting: Cardiovascular Disease

## 2017-07-21 MED ORDER — POTASSIUM CHLORIDE ER 20 MEQ PO TBCR: 20.0000 meq | EXTENDED_RELEASE_TABLET | Freq: Two times a day (BID) | ORAL | 1 refills | Status: DC

## 2017-07-21 MED ORDER — METOPROLOL SUCCINATE ER 25 MG PO TB24: 25.0000 mg | ORAL_TABLET | Freq: Two times a day (BID) | ORAL | 1 refills | Status: DC

## 2017-07-21 MED ORDER — LOSARTAN POTASSIUM 100 MG PO TABS: 100.0000 mg | ORAL_TABLET | Freq: Every day | ORAL | 1 refills | Status: DC

## 2017-07-21 NOTE — Telephone Encounter (Signed)
Please call regarding pt medications. He states he never received his rx. Please call to discuss. This is a medication Dr. Rockey Situ added.

## 2017-07-21 NOTE — Telephone Encounter (Signed)
Called patient. States he didn't get the new medications when he was discharged from the hospital. He says the nurse who discharged him from the hospital never sent in the prescriptions. He says he never got the paper prescriptions either. We reviewed his medication bottles and he is still taking lisinopril and not taking potassium, losartan or metoprolol. Advised patient to stop lisinopril. He verbalized understanding to start metoprolol, losartan and potassium as prescribed after I send them into his verified pharmacy. He also has appointment tomorrow with Dr Rockey Situ. Advised him to keep this appointment and bring his medication bottles. Rx's sent to pharmacy.

## 2017-07-22 ENCOUNTER — Ambulatory Visit (INDEPENDENT_AMBULATORY_CARE_PROVIDER_SITE_OTHER): Payer: Medicare HMO | Admitting: Cardiovascular Disease

## 2017-07-22 ENCOUNTER — Encounter: Payer: Self-pay | Admitting: Cardiovascular Disease

## 2017-07-22 ENCOUNTER — Telehealth: Payer: Self-pay

## 2017-07-22 ENCOUNTER — Encounter: Payer: Self-pay | Admitting: *Deleted

## 2017-07-22 VITALS — BP 140/70 | HR 101 | Ht 77.0 in | Wt 231.8 lb

## 2017-07-22 DIAGNOSIS — I5022 Chronic systolic (congestive) heart failure: Secondary | ICD-10-CM

## 2017-07-22 DIAGNOSIS — I10 Essential (primary) hypertension: Secondary | ICD-10-CM | POA: Diagnosis not present

## 2017-07-22 DIAGNOSIS — I4892 Unspecified atrial flutter: Secondary | ICD-10-CM

## 2017-07-22 MED ORDER — SPIRONOLACTONE 25 MG PO TABS: 25.0000 mg | ORAL_TABLET | Freq: Every day | ORAL | 3 refills | Status: DC

## 2017-07-22 MED ORDER — FUROSEMIDE 40 MG PO TABS: 40.0000 mg | ORAL_TABLET | Freq: Two times a day (BID) | ORAL | 3 refills | Status: DC

## 2017-07-22 MED ORDER — RIVAROXABAN 20 MG PO TABS: 20.0000 mg | ORAL_TABLET | Freq: Every day | ORAL | 6 refills | Status: DC

## 2017-07-22 MED ORDER — METOPROLOL SUCCINATE ER 25 MG PO TB24: 25.0000 mg | ORAL_TABLET | Freq: Two times a day (BID) | ORAL | 3 refills | Status: DC

## 2017-07-22 MED ORDER — POTASSIUM CHLORIDE ER 20 MEQ PO TBCR: 20.0000 meq | EXTENDED_RELEASE_TABLET | Freq: Two times a day (BID) | ORAL | 3 refills | Status: DC

## 2017-07-22 MED ORDER — LOSARTAN POTASSIUM 100 MG PO TABS: 100.0000 mg | ORAL_TABLET | Freq: Every day | ORAL | 3 refills | Status: DC

## 2017-07-22 MED ORDER — FUROSEMIDE 40 MG PO TABS: 40.0000 mg | ORAL_TABLET | Freq: Two times a day (BID) | ORAL | 1 refills | Status: DC

## 2017-07-22 MED ORDER — ATORVASTATIN CALCIUM 40 MG PO TABS: 40.0000 mg | ORAL_TABLET | Freq: Every day | ORAL | 3 refills | Status: DC

## 2017-07-22 NOTE — Telephone Encounter (Signed)
Flagged on EMMI report for not receiving discharge papers, having unfilled prescriptions, and not being able to fill them today/tomorrow.   1st attempt to reach patient made 07/22/17 at 2:30pm, however unable to reach patient.  Left voicemail encouraging callback.  Will attempt at later time.

## 2017-07-22 NOTE — Patient Instructions (Addendum)
We will refill your medications   Medication Instructions:   No medication changes made  Labwork:  No new labs needed  Testing/Procedures:  No further testing at this time   Follow-Up: It was a pleasure seeing you in the office today. Please call us if you have new issues that need to be addressed before your next appt.  [phone]  Your physician wants you to follow-up in: 3 months.  You will receive a reminder letter in the mail two months in advance. If you don't receive a letter, please call our office to schedule the follow-up appointment.  If you need a refill on your cardiac medications before your next appointment, please call your pharmacy.  For educational health videos Log in to : [email or web address] Or : [email or web address], password : [email or web address]

## 2017-07-22 NOTE — Telephone Encounter (Signed)
Patient returned call.  States he was supposed to discharge home on some medications, but that the nurse didn't get them to him.  He mentioned he saw Dr. Rockey Situ earlier today and that he got him straightened out.  He does not have his discharge papers as he thought he left them in his room when he left.  I asked if he would like a copy sent to him and he said yes.  Confirmed address to mail discharge papers to.  I thanked him for his time and feedback, as well as apologized for his experience.  Patient made aware that he would receive one more automated call in the next few days checking on him a final time.

## 2017-07-23 ENCOUNTER — Telehealth: Payer: Self-pay | Admitting: Cardiovascular Disease

## 2017-07-23 NOTE — Telephone Encounter (Signed)
Spoke with patient and let him know that I called pharmacy and confirmed his medications. He verbalized understanding with no further questions at this time. He did ask about a coupon for the xarelto and advised that he could come by the office at anytime to pick up that up. He verbalized understanding of our conversation with no further questions at this time.

## 2017-07-23 NOTE — Telephone Encounter (Signed)
Please see note below pt saw Gollan yesterday @ [facility].

## 2017-07-23 NOTE — Telephone Encounter (Signed)
Patient calling about medications that were sent to [facility] [address].  They called him with an issue he should contact dr. Rockey Situ about but  Doesn't know why or which medication it could be.  Patient also says he forgot xarelto coupon when in the office yesterday.    Attempted to contact pharmacy but they are not open until 9 am.

## 2017-07-23 NOTE — Telephone Encounter (Signed)
Pharmacist just wanted to confirm that he is taking both spironolactone, potassium, and furosemide. Confirmed these were all reviewed by Dr. Rockey Situ yesterday during his office visit. She noted this information in their system and she had no other questions at this time.

## 2017-07-24 ENCOUNTER — Ambulatory Visit: Admission: RE | Admit: 2017-07-24 | Payer: Medicare HMO | Source: Ambulatory Visit | Admitting: Cardiovascular Disease

## 2017-07-24 ENCOUNTER — Encounter: Admission: RE | Payer: Self-pay | Source: Ambulatory Visit

## 2017-07-24 SURGERY — CARDIOVERSION (CATH LAB)
Anesthesia: General

## 2017-07-25 ENCOUNTER — Other Ambulatory Visit: Payer: Self-pay

## 2017-07-25 MED ORDER — POTASSIUM CHLORIDE ER 20 MEQ PO TBCR: 20.0000 meq | EXTENDED_RELEASE_TABLET | Freq: Two times a day (BID) | ORAL | 3 refills | Status: DC

## 2017-07-31 NOTE — Telephone Encounter (Signed)
Patient states he is almost out of xarelto medication He would like to get a coupon for the xarelto, he will not be able to come by til the 21st to pick it up Please call to discuss

## 2017-07-31 NOTE — Telephone Encounter (Signed)
S/w patient. He meant that he needed samples of Xarelto because he cannot afford the medication. I inquired if he had started paperwork on Patient assistance. He said he had not. Advised I will leave samples along with Xarelto Patient Assistance paperwork to complete. Dr Rockey Situ already signed and completed physician portion. Patient verbalized understanding to fill out his portion and fax or mail to company.  Medication Samples have been provided to the patient.  Drug name: Xarelto       Strength: 20 mg        Qty: 3 bottles  LOT: [id number]  Exp.Date: 06-2019

## 2017-08-03 NOTE — Progress Notes (Signed)
Patient ID: Nathan [name]., male    DOB: Sep 17, 1950, 67 y.o.   MRN: [id number]  HPI  Nathan [name] is a 67 y/o male with a history of HTN, current tobacco use and chronic heart failure.   Cardiac catheterization done 07/18/17 and showed heavily calcified coronary arteries with mild nonobstructive disease.  Left dominant system. Right heart catheterization showed moderately to severely elevated filling pressures, severe pulmonary hypertension and severely reduced cardiac output. Echo report from 06/16/17 reviewed and showed an EF of 15-20% along with mod-severe TR and an elevated PA pressure of 45 mm Hg.  Admitted 07/15/17 due to HF and atrial fibrillation. Cardiology consult obtained. Given IV diuretics and then transitioned back to oral diuretics. Discharged after 4 days. Admitted 06/15/17 due to a. Flutter with RVR along with HF exacerbation. Had ablation done 06/16/17. Initially given IV lasix along with heparin drip and then transitioned to oral medications. Discharged after 2 days. Admitted 06/12/17 due to HF and a. Flutter with RVR. Given IV lasix and transferred to [facility] after 3 days.   He presents today with a chief complaint of a follow-up visit. He has no associated symptoms and denies any difficulty sleeping, abdominal distention, palpitations, pedal edema, chest pain, shortness of breath, fatigue, dizziness or weight gain.   Past Medical History:  Diagnosis Date  . Atrial flutter ([place])    a. s/p TEE/DCCV 04/2017; b. CHADS2VASc => 3 (CHF, HTN, age x 1); c. not compliant with Xarelto  . Chronic combined systolic and diastolic CHF (congestive heart failure) ([place])    a. TTE 2/19: EF 20-25%, diffuse HK, mod dilated LA, mildly reduced RVSF, PASP 42; b. TTE 3/19: EF 20-25%, diffuse HK, RVSF nl, atrial flutter with RVR  . Essential hypertension   . Pulmonary hypertension ([place])    Past Surgical History:  Procedure Laterality Date  . A-FLUTTER ABLATION N/A 06/16/2017   Procedure: A-FLUTTER ABLATION;   Surgeon: [name], MD;  Location: [place] [facility];  Service: Cardiovascular;  Laterality: N/A;  . CARDIOVERSION N/A 05/01/2017   Procedure: CARDIOVERSION;  Surgeon: [name], MD;  Location: [facility] ORS;  Service: Cardiovascular;  Laterality: N/A;  . RIGHT/LEFT HEART CATH AND CORONARY ANGIOGRAPHY N/A 07/18/2017   Procedure: RIGHT/LEFT HEART CATH AND CORONARY ANGIOGRAPHY;  Surgeon: [name], MD;  Location: [place] [facility];  Service: Cardiovascular;  Laterality: N/A;  . TEE WITHOUT CARDIOVERSION N/A 05/01/2017   Procedure: TRANSESOPHAGEAL ECHOCARDIOGRAM (TEE);  Surgeon: [name], MD;  Location: [facility] ORS;  Service: Cardiovascular;  Laterality: N/A;  . TEE WITHOUT CARDIOVERSION N/A 06/16/2017   Procedure: TRANSESOPHAGEAL ECHOCARDIOGRAM (TEE);  Surgeon: [name], MD;  [facility] ENDOSCOPY;  Service: Cardiovascular;  Laterality: N/A;   Family History  Problem Relation Age of Onset  . Alzheimer's disease Mother   . Alzheimer's disease Father    Social History   Tobacco Use  . Smoking status: Former Smoker    Last attempt to quit: 04/21/2017    Years since quitting: 0.2  . Smokeless tobacco: Never Used  Substance Use Topics  . Alcohol use: Yes    Alcohol/week: 1.8 oz    Types: 3 Cans of beer per week    Frequency: Never    Comment: Drink a half of a 40oz beer every day, drank heavily in the past   No Known Allergies  Prior to Admission medications   Medication Sig Start Date End Date Taking? Authorizing Provider  atorvastatin (LIPITOR) 40 MG tablet  Take 1 tablet (40 mg total) by mouth daily at 6 PM. 07/22/17  Yes Gollan, Kathlene November, MD  furosemide (LASIX) 40 MG tablet Take 1 tablet (40 mg total) by mouth 2 (two) times daily. 07/22/17 08/21/17 Yes Gollan, Kathlene November, MD  losartan (COZAAR) 100 MG tablet Take 1 tablet (100 mg total) by mouth daily. 07/22/17 08/21/17 Yes Gollan, Kathlene November, MD  metoprolol succinate (TOPROL-XL) 25 MG 24 hr tablet Take 1 tablet (25  mg total) by mouth 2 (two) times daily. 07/22/17 08/21/17 Yes Gollan, Kathlene November, MD  Potassium Chloride ER 20 MEQ TBCR Take 20 mEq by mouth 2 (two) times daily. 07/25/17 08/24/17 Yes Gollan, Kathlene November, MD  rivaroxaban (XARELTO) 20 MG TABS tablet Take 1 tablet (20 mg total) by mouth daily with supper. 07/22/17  Yes [name], MD  spironolactone (ALDACTONE) 25 MG tablet Take 1 tablet (25 mg total) by mouth daily. 07/22/17 10/20/17 Yes [name], MD    Review of Systems  Constitutional: Negative for appetite change and fatigue.  HENT: Negative for congestion, postnasal drip and sore throat.   Eyes: Negative.   Respiratory: Negative for chest tightness and shortness of breath.   Cardiovascular: Negative for chest pain, palpitations and leg swelling.  Gastrointestinal: Negative for abdominal distention and abdominal pain.  Endocrine: Negative.   Genitourinary: Negative.   Musculoskeletal: Negative for back pain and neck pain.  Skin: Negative.   Allergic/Immunologic: Negative.   Neurological: Negative for dizziness and light-headedness.  Hematological: Negative for adenopathy. Does not bruise/bleed easily.  Psychiatric/Behavioral: Negative for dysphoric mood and sleep disturbance (sleeping on 2 pillows). The patient is not nervous/anxious.    Vitals:   08/05/17 0915  BP: 134/86  Pulse: 83  Resp: 18  SpO2: 100%  Weight: 223 lb (101.2 kg)  Height: 6\' 5"  (1.956 m)   Wt Readings from Last 3 Encounters:  08/05/17 223 lb (101.2 kg)  07/22/17 231 lb 12 oz (105.1 kg)  07/19/17 225 lb 4.8 oz (102.2 kg)   Lab Results  Component Value Date   CREATININE 1.16 07/19/2017   CREATININE 1.45 (H) 07/18/2017   CREATININE 1.35 (H) 07/17/2017    Physical Exam  Constitutional: He is oriented to person, place, and time. He appears well-developed and well-nourished.  HENT:  Head: Normocephalic and atraumatic.  Neck: Normal range of motion. Neck supple. No JVD present.  Cardiovascular: Normal  rate. An irregular rhythm present.  Pulmonary/Chest: Effort normal. No respiratory distress. He has no wheezes. He has no rales.  Abdominal: Soft. He exhibits no distension. There is no tenderness.  Musculoskeletal: He exhibits no edema or tenderness.  Neurological: He is alert and oriented to person, place, and time.  Skin: Skin is warm and dry.  Psychiatric: He has a normal mood and affect. His behavior is normal. Thought content normal.  Nursing note and vitals reviewed.   Assessment & Plan:  1: Chronic heart failure with reduced ejection fraction- - NYHA class I - euvolemic today - weighing daily and says that his weight has been stable. Reviwed the importance of calling for any overnight weight gain of >2 pounds or a weekly weight gain of >5 pounds - weight down 11 pounds since he was last here - not adding salt to his food and tries to eat low sodium foods. Reviewed the importance of closely following a 2000mg  sodium diet  - continues to work 3 days a week at [employer] but says that he usually eats a salad  or tomato sandwich - saw cardiology Rockey Situ) 07/22/17 & returns 08/16/17 - since currently without HF symptoms, will not switch losartan to entresto - BNP on 07/15/17 was 4283.0 - PharmD reconciled medications with the patient  2: HTN- - BP looks good today  - BMP from 07/19/17 reviewed and showed sodium 138, potassium 3.0 and GFR >60  3: Atrial flutter- - had an ablation done 06/16/17 - taking rivaroxaban - saw EP [name]) 07/10/17  Medication bottles were reviewed.  Return in 3 months or sooner for any questions/problems before then.

## 2017-08-05 ENCOUNTER — Ambulatory Visit: Payer: Medicare HMO | Attending: Family | Admitting: Family

## 2017-08-05 ENCOUNTER — Encounter: Payer: Self-pay | Admitting: Family

## 2017-08-05 VITALS — BP 134/86 | HR 83 | Resp 18 | Ht 77.0 in | Wt 223.0 lb

## 2017-08-05 DIAGNOSIS — Z7901 Long term (current) use of anticoagulants: Secondary | ICD-10-CM | POA: Diagnosis not present

## 2017-08-05 DIAGNOSIS — I509 Heart failure, unspecified: Secondary | ICD-10-CM | POA: Diagnosis not present

## 2017-08-05 DIAGNOSIS — I4892 Unspecified atrial flutter: Secondary | ICD-10-CM

## 2017-08-05 DIAGNOSIS — I110 Hypertensive heart disease with heart failure: Secondary | ICD-10-CM | POA: Insufficient documentation

## 2017-08-05 DIAGNOSIS — I5022 Chronic systolic (congestive) heart failure: Secondary | ICD-10-CM

## 2017-08-05 DIAGNOSIS — I272 Pulmonary hypertension, unspecified: Secondary | ICD-10-CM | POA: Insufficient documentation

## 2017-08-05 DIAGNOSIS — I10 Essential (primary) hypertension: Secondary | ICD-10-CM

## 2017-08-05 DIAGNOSIS — Z79899 Other long term (current) drug therapy: Secondary | ICD-10-CM | POA: Insufficient documentation

## 2017-08-05 DIAGNOSIS — Z9889 Other specified postprocedural states: Secondary | ICD-10-CM | POA: Insufficient documentation

## 2017-08-05 NOTE — Patient Instructions (Signed)
Continue weighing daily and call for an overnight weight gain of > 2 pounds or a weekly weight gain of >5 pounds. 

## 2017-08-26 ENCOUNTER — Ambulatory Visit (INDEPENDENT_AMBULATORY_CARE_PROVIDER_SITE_OTHER): Payer: Medicare HMO | Admitting: Cardiovascular Disease

## 2017-08-26 ENCOUNTER — Encounter: Payer: Self-pay | Admitting: Cardiovascular Disease

## 2017-08-26 VITALS — BP 140/72 | HR 73 | Ht 77.0 in | Wt 228.0 lb

## 2017-08-26 DIAGNOSIS — I10 Essential (primary) hypertension: Secondary | ICD-10-CM | POA: Diagnosis not present

## 2017-08-26 DIAGNOSIS — I482 Chronic atrial fibrillation, unspecified: Secondary | ICD-10-CM

## 2017-08-26 DIAGNOSIS — I420 Dilated cardiomyopathy: Secondary | ICD-10-CM

## 2017-08-26 DIAGNOSIS — Z720 Tobacco use: Secondary | ICD-10-CM

## 2017-08-26 DIAGNOSIS — I4892 Unspecified atrial flutter: Secondary | ICD-10-CM | POA: Diagnosis not present

## 2017-08-26 DIAGNOSIS — I5022 Chronic systolic (congestive) heart failure: Secondary | ICD-10-CM | POA: Diagnosis not present

## 2017-08-26 NOTE — Patient Instructions (Addendum)
Medication Instructions:   No medication changes made Medication Samples have been provided to the patient.  Drug name: Xarelto       Strength: 20 mg        Qty: 1 bottle  LOT: [id number]  Exp.Date: 03/21  Labwork:  No new labs needed  Testing/Procedures:  We will order an echocardiogram for cardiomyopathy, atrial flutter   Follow-Up: It was a pleasure seeing you in the office today. Please call us if you have new issues that need to be addressed before your next appt.  [phone]  Your physician wants you to follow-up in: 6 months.  You will receive a reminder letter in the mail two months in advance. If you don't receive a letter, please call our office to schedule the follow-up appointment.  If you need a refill on your cardiac medications before your next appointment, please call your pharmacy.  For educational health videos Log in to : [email or web address] Or : [email or web address], password : [email or web address]

## 2017-08-26 NOTE — Progress Notes (Signed)
Cardiology Office Note  Date:  08/26/2017   ID:  Nathan [name]., DOB 06-06-1950, MRN [id number]  PCP:  Nathan [name], [name]   Chief Complaint  Nathan [name] presents with  . Other    3 month follow up. Patietn states he feels "GREAT"  Meds reviewed verbally with Nathan [name].     HPI:  67 y.o.malewith history of  CAD tobacco abuse quit 04/28/17 Admission to the hospital 04/2017 with new onset atrial flutter with RVR,  elevated troponin,  acutecombinedsystolic and diastolic CHF Ejection fraction 20-25% February 13th 2019 S/p TEE and cardioversion 05/01/2017 Back into atrial flutter, ejection fraction 20-25% Flutter ablation 06/16/2017 Was in atrial fibrillation on office visit with Dr. [name] July 05 2017 Who presents for follow up of his cardiomyopathy, atrial flutter after cardioversion  cardiac catheterization 07/18/2017 showing nonobstructive disease Heavy calcification markedly elevated left ventricular end-diastolic pressures Insisted on leaving the hospital 07/19/2017  Discharged in normal sinus rhythm Lasix up to 40 twice a day with potassium 20 twice a day  Reports that he feels well on today's visit Almost out of xarelto, unable to afford the medication Has to go pick up his metoprolol and Aldactone Denies any lower extremity edema Reports breathing is back to baseline   EKG reviewed with him in detail showing  Normal sinus rhythm rate 73 bpm no significant ST or T-wave changes  Other past medical history reviewed  atrial flutter with RVR with 2:1 AV block with heart rates in the 120s bpm Despite advnacing meds He was refusing  metoprolol  -Echo showed EF 20-25% (done while tachycardic), left atrium 54 mm -CHADS2VASC at least least 2 (CHF, age x 1) converted with ablation  Elevated pressures obtained through echocardiogram, TEE D/c  on Lasix 40 daily   PMH:   has a past medical history of Atrial flutter ([place]), Chronic combined systolic and diastolic CHF  (congestive heart failure) ([place]), Essential hypertension, and Pulmonary hypertension ([place]).  PSH:    Past Surgical History:  Procedure Laterality Date  . A-FLUTTER ABLATION N/A 06/16/2017   Procedure: A-FLUTTER ABLATION;  Surgeon: [name], MD;  Location: [place] [facility];  Service: Cardiovascular;  Laterality: N/A;  . CARDIAC CATHETERIZATION    . CARDIOVERSION N/A 05/01/2017   Procedure: CARDIOVERSION;  Surgeon: [name], MD;  Location: [facility] ORS;  Service: Cardiovascular;  Laterality: N/A;  . CORONARY ANGIOPLASTY    . RIGHT/LEFT HEART CATH AND CORONARY ANGIOGRAPHY N/A 07/18/2017   Procedure: RIGHT/LEFT HEART CATH AND CORONARY ANGIOGRAPHY;  Surgeon: [name], MD;  Location: [place] [facility];  Service: Cardiovascular;  Laterality: N/A;  . TEE WITHOUT CARDIOVERSION N/A 05/01/2017   Procedure: TRANSESOPHAGEAL ECHOCARDIOGRAM (TEE);  Surgeon: [name], MD;  Location: [facility] ORS;  Service: Cardiovascular;  Laterality: N/A;  . TEE WITHOUT CARDIOVERSION N/A 06/16/2017   Procedure: TRANSESOPHAGEAL ECHOCARDIOGRAM (TEE);  Surgeon: [name], MD;  Location: [facility];  Service: Cardiovascular;  Laterality: N/A;    Current Outpatient Medications  Medication Sig Dispense Refill  . atorvastatin (LIPITOR) 40 MG tablet Take 1 tablet (40 mg total) by mouth daily at 6 PM. 180 tablet 3  . rivaroxaban (XARELTO) 20 MG TABS tablet Take 1 tablet (20 mg total) by mouth daily with supper. 30 tablet 6  . spironolactone (ALDACTONE) 25 MG tablet Take 1 tablet (25 mg total) by mouth daily. 90 tablet 3  . furosemide (LASIX) 40 MG tablet Take 1 tablet (40 mg total) by mouth 2 (two) times daily.  180 tablet 3  . losartan (COZAAR) 100 MG tablet Take 1 tablet (100 mg total) by mouth daily. 90 tablet 3  . metoprolol succinate (TOPROL-XL) 25 MG 24 hr tablet Take 1 tablet (25 mg total) by mouth 2 (two) times daily. 180 tablet 3  . Potassium Chloride ER 20 MEQ TBCR Take 20 mEq by mouth 2  (two) times daily. 180 tablet 3   No current facility-administered medications for this visit.      Allergies:   Nathan [name] has no known allergies.   Social History:  The Nathan [name]  reports that he quit smoking about 4 months ago. He has never used smokeless tobacco. He reports that he drinks about 1.8 oz of alcohol per week. He reports that he does not use drugs.   Family History:   family history includes Alzheimer's disease in his father and mother.    Review of Systems: Review of Systems  Constitutional: Negative.   Respiratory: Negative.   Cardiovascular: Negative.   Gastrointestinal: Negative.   Musculoskeletal: Negative.   Neurological: Negative.   Psychiatric/Behavioral: Negative.   All other systems reviewed and are negative.    PHYSICAL EXAM: VS:  BP 140/72 (BP Location: Left Arm, Nathan [name] Position: Sitting, Cuff Size: Normal)   Pulse 73   Ht 6\' 5"  (1.956 m)   Wt 228 lb (103.4 kg)   BMI 27.04 kg/m  , BMI Body mass index is 27.04 kg/m.  No significant change in exam Constitutional:  oriented to person, place, and time. No distress.  HENT:  Head: Normocephalic and atraumatic.  Eyes:  no discharge. No scleral icterus.  Neck: Normal range of motion. Neck supple. No JVD present.  Cardiovascular: Normal rate, regular rhythm, mixed with tachycardia, regular, normal heart sounds and intact distal pulses. Exam reveals no gallop and no friction rub. No edema No murmur heard. Pulmonary/Chest: Effort normal and breath sounds normal. No stridor. No respiratory distress.  no wheezes.  no rales.  no tenderness.  Abdominal: Soft.  no distension.  no tenderness.  Musculoskeletal: Normal range of motion.  no  tenderness or deformity.  Neurological:  normal muscle tone. Coordination normal. No atrophy Skin: Skin is warm and dry. No rash noted. not diaphoretic.  Psychiatric:  normal mood and affect. behavior is normal. Thought content normal.    Recent Labs: 04/29/2017: ALT  61 06/15/2017: TSH 4.001 07/15/2017: B Natriuretic Peptide 4,283.0 07/19/2017: BUN 26; Creatinine, Ser 1.16; Hemoglobin 11.9; Magnesium 1.8; Platelets 295; Potassium 3.3; Sodium 138    Lipid Panel Lab Results  Component Value Date   CHOL 157 05/01/2017   HDL 44 05/01/2017   LDLCALC 85 05/01/2017   TRIG 138 05/01/2017      Wt Readings from Last 3 Encounters:  08/26/17 228 lb (103.4 kg)  08/05/17 223 lb (101.2 kg)  07/22/17 231 lb 12 oz (105.1 kg)       ASSESSMENT AND PLAN:  Atrial flutter with rapid ventricular response (HCC)  Recent ablation continue Xarelto given history of atrial fibrillation discussed the importance of staying on his medications We have filled out Nathan [name] assistance forms for his xarelto Continue metoprolol 25 twice a day Unable to tolerate amiodarone in the past  dilated cardiomyopathy /tachycardia mediated No medication changes made today Appears relatively euvolemic We have requested basic metabolic palpate he prefers to have this when he meets with Darylene Price next week  Pulmonary HTN ([place]) - Plan: EKG 12-Lead Markedly elevated ventricular end-diastolic pressure on recent cardiac catheterization We'll continue Lasix twice a  day with potassium twice a day  Acute on chronic combined systolic and diastolic CHF (congestive heart failure) ([place]) - Plan: EKG 12-Lead Repeat echocardiogram has been ordered to evaluate ejection fraction and normal sinus rhythm on medications  Atrial fibrillation Maintaining normal sinus rhythm Intolerant of amiodarone Stressed importance of compliance with his anticoagulation   Total encounter time more than 25 minutes  Greater than 50% was spent in counseling and coordination of care with the Nathan [name]   Disposition:   F/U  6 months   Orders Placed This Encounter  Procedures  . EKG 12-Lead  . ECHOCARDIOGRAM COMPLETE     Signed, Esmond Plants, M.D., Ph.D. 08/26/2017  [place],  [place]

## 2017-09-04 ENCOUNTER — Ambulatory Visit (INDEPENDENT_AMBULATORY_CARE_PROVIDER_SITE_OTHER): Payer: Medicare HMO

## 2017-09-04 ENCOUNTER — Other Ambulatory Visit: Payer: Self-pay

## 2017-09-04 DIAGNOSIS — I4892 Unspecified atrial flutter: Secondary | ICD-10-CM | POA: Diagnosis not present

## 2017-09-04 DIAGNOSIS — I420 Dilated cardiomyopathy: Secondary | ICD-10-CM | POA: Diagnosis not present

## 2017-09-04 NOTE — Telephone Encounter (Signed)
Patient here to drop of 1040 for patient assistance application. Will fax this over to assistance program.  Medication Samples have been provided to the patient.  Drug name: Xarelto       Strength: 20mg         Qty: 2 bottles  LOT: [id number]  Exp.Date: 04/21

## 2017-09-19 ENCOUNTER — Telehealth: Payer: Self-pay | Admitting: Cardiovascular Disease

## 2017-09-19 NOTE — Telephone Encounter (Signed)
Pt calling stating he's only got about 2 pills left on his Xarelto  He's not heard anything back for being approved  But needs advise, for he's not able to afford it in the pharmacy   Please advise

## 2017-09-19 NOTE — Telephone Encounter (Signed)
Patient contacted samples available of Xarelto 20 mg to get him through until we hear from the patient assistance program.   Medication Samples have been provided to the patient.  Drug name: Xarelto      Strength: 20 mg        Qty: 28  LOT: [id number]  Exp.Date: 06/1919   The patient has been instructed regarding the correct time, dose, and frequency of taking this medication, including desired effects and most common side effects.   Nathan [name] 12:03 PM 09/19/2017

## 2017-09-26 ENCOUNTER — Telehealth: Payer: Self-pay

## 2017-09-26 NOTE — Telephone Encounter (Signed)
Received temporary approval from [employer] for [employer].  Patient is approved through 10/25/2017 and is missing documentation from patients application.  [name] and [name] will need a copy of patients Federal Tax Return to continue receiving patient assistants.   Spoke with patient.  He stated he has already brought his tax papers herr.  He stated he is not able to make it back by here until August 20th.  He said he will bring his tax papers back by then.

## 2017-10-14 DIAGNOSIS — I509 Heart failure, unspecified: Secondary | ICD-10-CM | POA: Diagnosis not present

## 2017-10-14 DIAGNOSIS — Z87891 Personal history of nicotine dependence: Secondary | ICD-10-CM | POA: Diagnosis not present

## 2017-10-14 DIAGNOSIS — I739 Peripheral vascular disease, unspecified: Secondary | ICD-10-CM | POA: Diagnosis not present

## 2017-10-14 DIAGNOSIS — I110 Hypertensive heart disease with heart failure: Secondary | ICD-10-CM | POA: Diagnosis not present

## 2017-10-14 DIAGNOSIS — E785 Hyperlipidemia, unspecified: Secondary | ICD-10-CM | POA: Diagnosis not present

## 2017-10-14 DIAGNOSIS — Z7901 Long term (current) use of anticoagulants: Secondary | ICD-10-CM | POA: Diagnosis not present

## 2017-10-14 DIAGNOSIS — I2510 Atherosclerotic heart disease of native coronary artery without angina pectoris: Secondary | ICD-10-CM | POA: Diagnosis not present

## 2017-10-14 DIAGNOSIS — I4891 Unspecified atrial fibrillation: Secondary | ICD-10-CM | POA: Diagnosis not present

## 2017-10-14 DIAGNOSIS — Z8249 Family history of ischemic heart disease and other diseases of the circulatory system: Secondary | ICD-10-CM | POA: Diagnosis not present

## 2017-10-14 DIAGNOSIS — Z823 Family history of stroke: Secondary | ICD-10-CM | POA: Diagnosis not present

## 2017-10-29 NOTE — Telephone Encounter (Signed)
Patient calling the office for samples of medication:   1.  What medication and dosage are you requesting samples for? Xarelto 20 MG   2.  Are you currently out of this medication? yes  Patient says he will need samples - patient assistance does not kick in until 9/1. Patient will be by to pick up at [place] appointment on 8/20

## 2017-10-29 NOTE — Telephone Encounter (Signed)
Ok to provide enough samples to get him to when his patient assistance kicks in. Thanks so much!

## 2017-11-04 ENCOUNTER — Telehealth: Payer: Self-pay

## 2017-11-04 ENCOUNTER — Encounter: Payer: Self-pay | Admitting: Family

## 2017-11-04 ENCOUNTER — Ambulatory Visit: Payer: Medicare HMO | Attending: Family | Admitting: Family

## 2017-11-04 VITALS — BP 169/114 | HR 59 | Resp 18 | Ht 77.0 in | Wt 236.5 lb

## 2017-11-04 DIAGNOSIS — I5042 Chronic combined systolic (congestive) and diastolic (congestive) heart failure: Secondary | ICD-10-CM | POA: Diagnosis not present

## 2017-11-04 DIAGNOSIS — Z7901 Long term (current) use of anticoagulants: Secondary | ICD-10-CM | POA: Diagnosis not present

## 2017-11-04 DIAGNOSIS — I272 Pulmonary hypertension, unspecified: Secondary | ICD-10-CM | POA: Diagnosis not present

## 2017-11-04 DIAGNOSIS — I110 Hypertensive heart disease with heart failure: Secondary | ICD-10-CM | POA: Insufficient documentation

## 2017-11-04 DIAGNOSIS — Z79899 Other long term (current) drug therapy: Secondary | ICD-10-CM | POA: Diagnosis not present

## 2017-11-04 DIAGNOSIS — Z720 Tobacco use: Secondary | ICD-10-CM | POA: Insufficient documentation

## 2017-11-04 DIAGNOSIS — I4891 Unspecified atrial fibrillation: Secondary | ICD-10-CM | POA: Insufficient documentation

## 2017-11-04 DIAGNOSIS — I5022 Chronic systolic (congestive) heart failure: Secondary | ICD-10-CM

## 2017-11-04 DIAGNOSIS — I10 Essential (primary) hypertension: Secondary | ICD-10-CM

## 2017-11-04 DIAGNOSIS — I4892 Unspecified atrial flutter: Secondary | ICD-10-CM | POA: Insufficient documentation

## 2017-11-04 NOTE — Telephone Encounter (Signed)
Patient dropped off tax papers Placed in nurse box

## 2017-11-04 NOTE — Progress Notes (Signed)
Patient ID: Nathan Lohmeyer., male    DOB: May 18, 1950, 67 y.o.   MRN: [id number]  HPI  Mr Nathan [name] is a 67 y/o male with a history of HTN, current tobacco use and chronic heart failure.   Echo report from 09/04/17 reviewed and showed an EF of 35-40%. Echo report from 06/16/17 reviewed and showed an EF of 15-20% along with mod-severe TR and an elevated PA pressure of 45 mm Hg.  Cardiac catheterization done 07/18/17 and showed heavily calcified coronary arteries with mild nonobstructive disease.  Left dominant system. Right heart catheterization showed moderately to severely elevated filling pressures, severe pulmonary hypertension and severely reduced cardiac output.   Admitted 07/15/17 due to HF and atrial fibrillation. Cardiology consult obtained. Given IV diuretics and then transitioned back to oral diuretics. Discharged after 4 days. Admitted 06/15/17 due to a. Flutter with RVR along with HF exacerbation. Had ablation done 06/16/17. Initially given IV lasix along with heparin drip and then transitioned to oral medications. Discharged after 2 days. Admitted 06/12/17 due to HF and a. Flutter with RVR. Given IV lasix and transferred to [facility] after 3 days.   He presents today with a chief complaint of a follow-up visit. He currently denies any difficulty sleeping, abdominal distention, palpitations, pedal edema, chest pain, shortness of breath, dizziness or fatigue. Has noticed a gradual weight gain since he stopped smoking.   Past Medical History:  Diagnosis Date  . Atrial flutter ([place])    a. s/p TEE/DCCV 04/2017; b. CHADS2VASc => 3 (CHF, HTN, age x 1); c. not compliant with Xarelto  . Chronic combined systolic and diastolic CHF (congestive heart failure) ([place])    a. TTE 2/19: EF 20-25%, diffuse HK, mod dilated LA, mildly reduced RVSF, PASP 42; b. TTE 3/19: EF 20-25%, diffuse HK, RVSF nl, atrial flutter with RVR  . Essential hypertension   . Pulmonary hypertension ([place])    Past Surgical History:   Procedure Laterality Date  . A-FLUTTER ABLATION N/A 06/16/2017   Procedure: A-FLUTTER ABLATION;  Surgeon: [name], MD;  Location: [facility];  Service: Cardiovascular;  Laterality: N/A;  . CARDIAC CATHETERIZATION    . CARDIOVERSION N/A 05/01/2017   Procedure: CARDIOVERSION;  Surgeon: [name], MD;  Location: [facility] ORS;  Service: Cardiovascular;  Laterality: N/A;  . CORONARY ANGIOPLASTY    . RIGHT/LEFT HEART CATH AND CORONARY ANGIOGRAPHY N/A 07/18/2017   Procedure: RIGHT/LEFT HEART CATH AND CORONARY ANGIOGRAPHY;  Surgeon: [name], MD;  Location: [facility];  Service: Cardiovascular;  Laterality: N/A;  . TEE WITHOUT CARDIOVERSION N/A 05/01/2017   Procedure: TRANSESOPHAGEAL ECHOCARDIOGRAM (TEE);  Surgeon: [name], MD;  Location: [facility] ORS;  Service: Cardiovascular;  Laterality: N/A;  . TEE WITHOUT CARDIOVERSION N/A 06/16/2017   Procedure: TRANSESOPHAGEAL ECHOCARDIOGRAM (TEE);  Surgeon: [name], MD;  Location: [facility];  Service: Cardiovascular;  Laterality: N/A;   Family History  Problem Relation Age of Onset  . Alzheimer's disease Mother   . Alzheimer's disease Father    Social History   Tobacco Use  . Smoking status: Former Smoker    Last attempt to quit: 04/21/2017    Years since quitting: 0.5  . Smokeless tobacco: Never Used  Substance Use Topics  . Alcohol use: Yes    Alcohol/week: 3.0 standard drinks    Types: 3 Cans of beer per week    Frequency: Never    Comment: Drink a half of a 40oz beer every day, drank heavily  in the past   No Known Allergies  Prior to Admission medications   Medication Sig Start Date End Date Taking? Authorizing Provider  furosemide (LASIX) 40 MG tablet Take 40 mg by mouth 2 (two) times daily.   Yes [provider]  losartan (COZAAR) 100 MG tablet Take 100 mg by mouth daily.   Yes [provider]  metoprolol succinate (TOPROL-XL) 25 MG 24 hr tablet Take 25 mg by mouth 2  (two) times daily.   Yes [provider]  potassium chloride SA (K-DUR,KLOR-CON) 20 MEQ tablet Take 20 mEq by mouth 2 (two) times daily.   Yes [provider]  rivaroxaban (XARELTO) 20 MG TABS tablet Take 1 tablet (20 mg total) by mouth daily with supper. 07/22/17  Yes [name], MD  spironolactone (ALDACTONE) 25 MG tablet Take 25 mg by mouth daily.   Yes [provider]  atorvastatin (LIPITOR) 40 MG tablet Take 1 tablet (40 mg total) by mouth daily at 6 PM. 07/22/17   Gollan, Kathlene November, MD    Review of Systems  Constitutional: Negative for appetite change and fatigue.  HENT: Negative for congestion, postnasal drip and sore throat.   Eyes: Negative.   Respiratory: Negative for chest tightness and shortness of breath.   Cardiovascular: Negative for chest pain, palpitations and leg swelling.  Gastrointestinal: Negative for abdominal distention and abdominal pain.  Endocrine: Negative.   Genitourinary: Negative.   Musculoskeletal: Negative for back pain and neck pain.  Skin: Negative.   Allergic/Immunologic: Negative.   Neurological: Negative for dizziness and light-headedness.  Hematological: Negative for adenopathy. Does not bruise/bleed easily.  Psychiatric/Behavioral: Negative for dysphoric mood and sleep disturbance (sleeping on 2 pillows). The patient is not nervous/anxious.    Vitals:   11/04/17 0920  BP: (!) 169/114  Pulse: (!) 59  Resp: 18  SpO2: 100%  Weight: 236 lb 8 oz (107.3 kg)  Height: 6\' 5"  (1.956 m)   Wt Readings from Last 3 Encounters:  11/04/17 236 lb 8 oz (107.3 kg)  08/26/17 228 lb (103.4 kg)  08/05/17 223 lb (101.2 kg)   Lab Results  Component Value Date   CREATININE 1.16 07/19/2017   CREATININE 1.45 (H) 07/18/2017   CREATININE 1.35 (H) 07/17/2017   Physical Exam  Constitutional: He is oriented to person, place, and time. He appears well-developed and well-nourished.  HENT:  Head: Normocephalic and atraumatic.  Neck:  Normal range of motion. Neck supple. No JVD present.  Cardiovascular: Normal rate. An irregular rhythm present.  Pulmonary/Chest: Effort normal. No respiratory distress. He has no wheezes. He has no rales.  Abdominal: Soft. He exhibits no distension. There is no tenderness.  Musculoskeletal: He exhibits no edema or tenderness.  Neurological: He is alert and oriented to person, place, and time.  Skin: Skin is warm and dry.  Psychiatric: He has a normal mood and affect. His behavior is normal. Thought content normal.  Nursing note and vitals reviewed.   Assessment & Plan:  1: Chronic heart failure with reduced ejection fraction- - NYHA class I - euvolemic today - weighing daily and says that his weight has gradually risen. Reviwed the importance of calling for any overnight weight gain of >2 pounds or a weekly weight gain of >5 pounds - weight up 13 pounds since he was last here 3 months ago and he says it's occurred since he quit smoking 8 months ago - not adding salt to his food and tries to eat low sodium foods. Reviewed  the importance of closely following a 2000mg  sodium diet  - continues to work 3 days a week at [employer] but says that he usually eats a salad or tomato sandwich - saw cardiology Rockey Situ) 08/26/17 - since currently without HF symptoms, will not switch losartan to entresto - BNP on 07/15/17 was 4283.0 - PharmD reconciled medications with the patient  2: HTN- - BP elevated today but he hasn't taken any of his medications yet today; was normal last time he was here 3 months ago - BMP from 07/19/17 reviewed and showed sodium 138, potassium 3.0 and GFR >60  3: Atrial flutter- - had an ablation done 06/16/17 - taking rivaroxaban but has been without it for the last 8 days; picking up samples from cardiology after leaving here - saw EP [name]) 07/10/17  Medication bottles were reviewed.  Return in 4 months or sooner for any questions/problems before then.

## 2017-11-04 NOTE — Patient Instructions (Signed)
Continue weighing daily and call for an overnight weight gain of > 2 pounds or a weekly weight gain of >5 pounds. 

## 2017-11-04 NOTE — Telephone Encounter (Signed)
Requested samples for Xarelto 20 mg. Medication Samples have been provided to the patient.  Drug name: Xarelto       Strength: 20 mg        Qty: 3 bottles  LOT: [id number]     Exp.Date: 6/21  Nathan [name] 9:26 AM 11/04/2017

## 2017-11-05 ENCOUNTER — Telehealth: Payer: Self-pay | Admitting: Cardiovascular Disease

## 2017-11-05 NOTE — Telephone Encounter (Signed)
The patient dropped off all of her 2018 tax information. I reviewed his chart and there is documentation in a phone note from 09/26/17 that the patient's application was missing his tax info for [employer] patient assistance for Xarelto.   I called and spoke with a rep at [facility] patient assistance and did confirm with her that the application was received, but his tax info was missing.  I advised this was sent with the original fax, but I have re faxed his 1040 tax information to [employer] at [phone]. Confirmation received.   Will forward to Dr. Donivan Scull nurse, Jeannene Patella, as an FYI that this has been done. All faxed paperwork is above Pam's desk.   I left a message for the patient that I have placed all of his original tax paperwork at the front desk for him to pick up at his convenience.

## 2017-11-19 NOTE — Telephone Encounter (Signed)
This encounter was created in error - please disregard.

## 2017-12-31 ENCOUNTER — Telehealth: Payer: Self-pay | Admitting: Cardiovascular Disease

## 2017-12-31 MED ORDER — FUROSEMIDE 40 MG PO TABS: 40.0000 mg | ORAL_TABLET | Freq: Two times a day (BID) | ORAL | 2 refills | Status: DC

## 2017-12-31 NOTE — Telephone Encounter (Signed)
Spoke with patient first. He said his furosemide and losartan were recalled. He got the prescriptions at [facility] on [address].  Called Pharmacy. The losartan he received in September was recalled but they have some in stock that has not been recalled. They will go ahead and fill it for patient to pick up. They also said patient's furosemide was not recalled.  Called patient back. He ended up explaining he needed a refill of the furosemide sent to [facility]. He verbalized understanding to go to [facility] to get the Losartan as well. Refill for furosemide sent to pharmacy too.

## 2017-12-31 NOTE — Telephone Encounter (Signed)
Please call regarding Losartan, and Furosemide. Pt states these medications has been recalled. Please call to discuss.

## 2018-02-07 ENCOUNTER — Inpatient Hospital Stay
Admission: EM | Admit: 2018-02-07 | Discharge: 2018-02-12 | DRG: 871 | Disposition: A | Discharge status: Home / Self Care | Payer: Medicare HMO | Attending: Internal Medicine | Admitting: Internal Medicine

## 2018-02-07 ENCOUNTER — Other Ambulatory Visit: Payer: Medicare HMO

## 2018-02-07 ENCOUNTER — Emergency Department: Payer: Medicare HMO

## 2018-02-07 ENCOUNTER — Other Ambulatory Visit: Payer: Self-pay

## 2018-02-07 ENCOUNTER — Encounter: Payer: Self-pay | Admitting: Internal Medicine

## 2018-02-07 ENCOUNTER — Inpatient Hospital Stay: Payer: Medicare HMO

## 2018-02-07 DIAGNOSIS — K3189 Other diseases of stomach and duodenum: Secondary | ICD-10-CM | POA: Diagnosis not present

## 2018-02-07 DIAGNOSIS — A0472 Enterocolitis due to Clostridium difficile, not specified as recurrent: Secondary | ICD-10-CM | POA: Diagnosis not present

## 2018-02-07 DIAGNOSIS — K921 Melena: Secondary | ICD-10-CM | POA: Diagnosis not present

## 2018-02-07 DIAGNOSIS — E872 Acidosis, unspecified: Secondary | ICD-10-CM

## 2018-02-07 DIAGNOSIS — K209 Esophagitis, unspecified: Secondary | ICD-10-CM | POA: Diagnosis present

## 2018-02-07 DIAGNOSIS — K319 Disease of stomach and duodenum, unspecified: Secondary | ICD-10-CM | POA: Diagnosis present

## 2018-02-07 DIAGNOSIS — J9601 Acute respiratory failure with hypoxia: Secondary | ICD-10-CM | POA: Diagnosis not present

## 2018-02-07 DIAGNOSIS — D631 Anemia in chronic kidney disease: Secondary | ICD-10-CM | POA: Diagnosis not present

## 2018-02-07 DIAGNOSIS — I130 Hypertensive heart and chronic kidney disease with heart failure and stage 1 through stage 4 chronic kidney disease, or unspecified chronic kidney disease: Secondary | ICD-10-CM | POA: Diagnosis present

## 2018-02-07 DIAGNOSIS — Z9861 Coronary angioplasty status: Secondary | ICD-10-CM | POA: Diagnosis not present

## 2018-02-07 DIAGNOSIS — N183 Chronic kidney disease, stage 3 (moderate): Secondary | ICD-10-CM | POA: Diagnosis present

## 2018-02-07 DIAGNOSIS — Z4659 Encounter for fitting and adjustment of other gastrointestinal appliance and device: Secondary | ICD-10-CM

## 2018-02-07 DIAGNOSIS — G9340 Encephalopathy, unspecified: Secondary | ICD-10-CM | POA: Diagnosis not present

## 2018-02-07 DIAGNOSIS — N170 Acute kidney failure with tubular necrosis: Secondary | ICD-10-CM | POA: Diagnosis not present

## 2018-02-07 DIAGNOSIS — R05 Cough: Secondary | ICD-10-CM

## 2018-02-07 DIAGNOSIS — R059 Cough, unspecified: Secondary | ICD-10-CM

## 2018-02-07 DIAGNOSIS — K2270 Barrett's esophagus without dysplasia: Secondary | ICD-10-CM | POA: Diagnosis present

## 2018-02-07 DIAGNOSIS — E8809 Other disorders of plasma-protein metabolism, not elsewhere classified: Secondary | ICD-10-CM | POA: Diagnosis present

## 2018-02-07 DIAGNOSIS — Z87891 Personal history of nicotine dependence: Secondary | ICD-10-CM

## 2018-02-07 DIAGNOSIS — Z7901 Long term (current) use of anticoagulants: Secondary | ICD-10-CM | POA: Diagnosis not present

## 2018-02-07 DIAGNOSIS — R680 Hypothermia, not associated with low environmental temperature: Secondary | ICD-10-CM | POA: Diagnosis present

## 2018-02-07 DIAGNOSIS — D62 Acute posthemorrhagic anemia: Secondary | ICD-10-CM

## 2018-02-07 DIAGNOSIS — Z79899 Other long term (current) drug therapy: Secondary | ICD-10-CM | POA: Diagnosis not present

## 2018-02-07 DIAGNOSIS — E875 Hyperkalemia: Secondary | ICD-10-CM

## 2018-02-07 DIAGNOSIS — T68XXXA Hypothermia, initial encounter: Secondary | ICD-10-CM | POA: Diagnosis not present

## 2018-02-07 DIAGNOSIS — R4020 Unspecified coma: Secondary | ICD-10-CM | POA: Diagnosis not present

## 2018-02-07 DIAGNOSIS — I4891 Unspecified atrial fibrillation: Secondary | ICD-10-CM | POA: Diagnosis not present

## 2018-02-07 DIAGNOSIS — K228 Other specified diseases of esophagus: Secondary | ICD-10-CM | POA: Diagnosis not present

## 2018-02-07 DIAGNOSIS — Z4682 Encounter for fitting and adjustment of non-vascular catheter: Secondary | ICD-10-CM | POA: Diagnosis not present

## 2018-02-07 DIAGNOSIS — R578 Other shock: Secondary | ICD-10-CM | POA: Diagnosis not present

## 2018-02-07 DIAGNOSIS — G92 Toxic encephalopathy: Secondary | ICD-10-CM | POA: Diagnosis present

## 2018-02-07 DIAGNOSIS — R1310 Dysphagia, unspecified: Secondary | ICD-10-CM | POA: Diagnosis not present

## 2018-02-07 DIAGNOSIS — F1010 Alcohol abuse, uncomplicated: Secondary | ICD-10-CM | POA: Diagnosis present

## 2018-02-07 DIAGNOSIS — D649 Anemia, unspecified: Secondary | ICD-10-CM | POA: Diagnosis not present

## 2018-02-07 DIAGNOSIS — I5042 Chronic combined systolic (congestive) and diastolic (congestive) heart failure: Secondary | ICD-10-CM | POA: Diagnosis present

## 2018-02-07 DIAGNOSIS — I10 Essential (primary) hypertension: Secondary | ICD-10-CM | POA: Diagnosis not present

## 2018-02-07 DIAGNOSIS — D689 Coagulation defect, unspecified: Secondary | ICD-10-CM | POA: Diagnosis not present

## 2018-02-07 DIAGNOSIS — K2289 Other specified disease of esophagus: Secondary | ICD-10-CM

## 2018-02-07 DIAGNOSIS — K922 Gastrointestinal hemorrhage, unspecified: Secondary | ICD-10-CM

## 2018-02-07 DIAGNOSIS — R049 Hemorrhage from respiratory passages, unspecified: Secondary | ICD-10-CM | POA: Diagnosis not present

## 2018-02-07 DIAGNOSIS — N179 Acute kidney failure, unspecified: Secondary | ICD-10-CM

## 2018-02-07 DIAGNOSIS — K920 Hematemesis: Secondary | ICD-10-CM | POA: Diagnosis not present

## 2018-02-07 LAB — BASIC METABOLIC PANEL
ANION GAP: 11 (ref 5–15)
ANION GAP: 9 (ref 5–15)
BUN: 48 mg/dL — ABNORMAL HIGH (ref 8–23)
BUN: 48 mg/dL — ABNORMAL HIGH (ref 8–23)
CALCIUM: 8.4 mg/dL — AB (ref 8.9–10.3)
CALCIUM: 7.8 mg/dL — AB (ref 8.9–10.3)
CO2: 20 mmol/L — ABNORMAL LOW (ref 22–32)
CO2: 21 mmol/L — ABNORMAL LOW (ref 22–32)
Chloride: 106 mmol/L (ref 98–111)
Chloride: 107 mmol/L (ref 98–111)
Creatinine, Ser: 3.6 mg/dL — ABNORMAL HIGH (ref 0.61–1.24)
Creatinine, Ser: 3.22 mg/dL — ABNORMAL HIGH (ref 0.61–1.24)
GFR calc non Af Amer: 19 mL/min — ABNORMAL LOW (ref >60)
GFR, EST AFRICAN AMERICAN: 19 mL/min — AB (ref >60)
GFR, EST AFRICAN AMERICAN: 22 mL/min — AB (ref >60)
GFR, EST NON AFRICAN AMERICAN: 16 mL/min — AB (ref >60)
GLUCOSE: 166 mg/dL — AB (ref 70–99)
GLUCOSE: 117 mg/dL — AB (ref 70–99)
Potassium: 6 mmol/L — ABNORMAL HIGH (ref 3.5–5.1)
Potassium: 5.4 mmol/L — ABNORMAL HIGH (ref 3.5–5.1)
SODIUM: 137 mmol/L (ref 135–145)
Sodium: 137 mmol/L (ref 135–145)

## 2018-02-07 LAB — BLOOD GAS, ARTERIAL
ACID-BASE DEFICIT: 8.3 mmol/L — AB (ref 0.0–2.0)
BICARBONATE: 16.2 mmol/L — AB (ref 20.0–28.0)
FIO2: 0.3
O2 Saturation: 92 %
PEEP/CPAP: 5 cmH2O
Patient temperature: 35.6
RATE: 16 resp/min
VT: 500 mL
pCO2 arterial: 30 mmHg — ABNORMAL LOW (ref 32.0–48.0)
pH, Arterial: 7.36 (ref 7.350–7.450)
pO2, Arterial: 62 mmHg — ABNORMAL LOW (ref 83.0–108.0)

## 2018-02-07 LAB — PREPARE RBC (CROSSMATCH)

## 2018-02-07 LAB — HEMOGLOBIN AND HEMATOCRIT, BLOOD
HCT: 21.5 % — ABNORMAL LOW (ref 39.0–52.0)
HCT: 28.9 % — ABNORMAL LOW (ref 39.0–52.0)
HEMATOCRIT: 27.2 % — AB (ref 39.0–52.0)
HEMOGLOBIN: 7.1 g/dL — AB (ref 13.0–17.0)
HEMOGLOBIN: 9.1 g/dL — AB (ref 13.0–17.0)
Hemoglobin: 9.7 g/dL — ABNORMAL LOW (ref 13.0–17.0)

## 2018-02-07 LAB — CBC WITH DIFFERENTIAL/PLATELET
Abs Immature Granulocytes: 0.06 10*3/uL (ref 0.00–0.07)
BASOS PCT: 0 %
Basophils Absolute: 0 10*3/uL (ref 0.0–0.1)
EOS PCT: 0 %
Eosinophils Absolute: 0 10*3/uL (ref 0.0–0.5)
HCT: 15.2 % — ABNORMAL LOW (ref 39.0–52.0)
HEMOGLOBIN: 4.7 g/dL — AB (ref 13.0–17.0)
Immature Granulocytes: 1 %
Lymphocytes Relative: 13 %
Lymphs Abs: 0.9 10*3/uL (ref 0.7–4.0)
MCH: 29.4 pg (ref 26.0–34.0)
MCHC: 30.9 g/dL (ref 30.0–36.0)
MCV: 95 fL (ref 80.0–100.0)
Monocytes Absolute: 0.7 10*3/uL (ref 0.1–1.0)
Monocytes Relative: 9 %
Neutro Abs: 5.5 10*3/uL (ref 1.7–7.7)
Neutrophils Relative %: 77 %
PLATELETS: 316 10*3/uL (ref 150–400)
RBC: 1.6 MIL/uL — ABNORMAL LOW (ref 4.22–5.81)
RDW: 13.7 % (ref 11.5–15.5)
WBC: 7.2 10*3/uL (ref 4.0–10.5)
nRBC: 0.6 % — ABNORMAL HIGH (ref 0.0–0.2)

## 2018-02-07 LAB — ABO/RH: ABO/RH(D): O NEG

## 2018-02-07 LAB — FIBRINOGEN: Fibrinogen: 469 mg/dL (ref 210–475)

## 2018-02-07 LAB — COMPREHENSIVE METABOLIC PANEL
ALK PHOS: 38 U/L (ref 38–126)
ALT: 14 U/L (ref 0–44)
ANION GAP: 19 — AB (ref 5–15)
AST: 30 U/L (ref 15–41)
Albumin: 3.3 g/dL — ABNORMAL LOW (ref 3.5–5.0)
BUN: 54 mg/dL — ABNORMAL HIGH (ref 8–23)
CALCIUM: 8.5 mg/dL — AB (ref 8.9–10.3)
CHLORIDE: 109 mmol/L (ref 98–111)
CO2: 12 mmol/L — AB (ref 22–32)
Creatinine, Ser: 3.83 mg/dL — ABNORMAL HIGH (ref 0.61–1.24)
GFR, EST AFRICAN AMERICAN: 17 mL/min — AB (ref >60)
GFR, EST NON AFRICAN AMERICAN: 15 mL/min — AB (ref >60)
Glucose, Bld: 136 mg/dL — ABNORMAL HIGH (ref 70–99)
Potassium: 5.6 mmol/L — ABNORMAL HIGH (ref 3.5–5.1)
SODIUM: 140 mmol/L (ref 135–145)
Total Bilirubin: 0.5 mg/dL (ref 0.3–1.2)
Total Protein: 6.6 g/dL (ref 6.5–8.1)

## 2018-02-07 LAB — LACTIC ACID, PLASMA
LACTIC ACID, VENOUS: 1.1 mmol/L (ref 0.5–1.9)
LACTIC ACID, VENOUS: 1 mmol/L (ref 0.5–1.9)

## 2018-02-07 LAB — PHOSPHORUS
PHOSPHORUS: 5.4 mg/dL — AB (ref 2.5–4.6)
Phosphorus: 5 mg/dL — ABNORMAL HIGH (ref 2.5–4.6)

## 2018-02-07 LAB — CG4 I-STAT (LACTIC ACID): LACTIC ACID, VENOUS: 5.69 mmol/L — AB (ref 0.5–1.9)

## 2018-02-07 LAB — MRSA PCR SCREENING: MRSA by PCR: NEGATIVE

## 2018-02-07 LAB — FERRITIN: FERRITIN: 68 ng/mL (ref 24–336)

## 2018-02-07 LAB — PROTIME-INR
INR: 2.29
INR: 1.1
INR: 1.37
PROTHROMBIN TIME: 24.9 s — AB (ref 11.4–15.2)
PROTHROMBIN TIME: 16.7 s — AB (ref 11.4–15.2)
Prothrombin Time: 14.1 seconds (ref 11.4–15.2)

## 2018-02-07 LAB — AMMONIA: Ammonia: 15 umol/L (ref 9–35)

## 2018-02-07 LAB — MAGNESIUM
Magnesium: 2.3 mg/dL (ref 1.7–2.4)
Magnesium: 2.2 mg/dL (ref 1.7–2.4)

## 2018-02-07 LAB — APTT: APTT: 54 s — AB (ref 24–36)

## 2018-02-07 LAB — TROPONIN I

## 2018-02-07 LAB — GLUCOSE, CAPILLARY: Glucose-Capillary: 208 mg/dL — ABNORMAL HIGH (ref 70–99)

## 2018-02-07 LAB — FIBRIN DERIVATIVES D-DIMER (ARMC ONLY): Fibrin derivatives D-dimer (ARMC): 451.27 ng/mL (FEU) (ref 0.00–499.00)

## 2018-02-07 LAB — ETHANOL: Alcohol, Ethyl (B): <10 mg/dL (ref <10)

## 2018-02-07 LAB — LIPASE, BLOOD: Lipase: 29 U/L (ref 11–51)

## 2018-02-07 LAB — PROCALCITONIN

## 2018-02-07 MED ORDER — PANTOPRAZOLE SODIUM 40 MG IV SOLR
40.0000 mg | Freq: Two times a day (BID) | INTRAVENOUS | Status: DC
  Administered 2018-02-07 – 2018-02-12 (×11): 40 mg via INTRAVENOUS
  Filled 2018-02-07 (×11): qty 40

## 2018-02-07 MED ORDER — SENNOSIDES-DOCUSATE SODIUM 8.6-50 MG PO TABS: 1.0000 | ORAL_TABLET | Freq: Every evening | ORAL | Status: DC | PRN

## 2018-02-07 MED ORDER — ROCURONIUM BROMIDE 50 MG/5ML IV SOLN
100.0000 mg | Freq: Once | INTRAVENOUS | Status: AC
  Administered 2018-02-07: 100 mg via INTRAVENOUS
  Filled 2018-02-07: qty 10

## 2018-02-07 MED ORDER — CHLORHEXIDINE GLUCONATE 0.12% ORAL RINSE (MEDLINE KIT)
15.0000 mL | Freq: Two times a day (BID) | OROMUCOSAL | Status: DC
  Administered 2018-02-07 – 2018-02-12 (×7): 15 mL via OROMUCOSAL

## 2018-02-07 MED ORDER — EMPTY CONTAINERS FLEXIBLE MISC
1800.0000 mg | Freq: Once | Status: AC
  Administered 2018-02-07: 1800 mg via INTRAVENOUS
  Filled 2018-02-07: qty 180

## 2018-02-07 MED ORDER — FENTANYL 2500MCG IN NS 250ML (10MCG/ML) PREMIX INFUSION
INTRAVENOUS | Status: AC
  Filled 2018-02-07: qty 250

## 2018-02-07 MED ORDER — BISACODYL 5 MG PO TBEC
5.0000 mg | DELAYED_RELEASE_TABLET | Freq: Every day | ORAL | Status: DC | PRN
  Filled 2018-02-07: qty 1

## 2018-02-07 MED ORDER — PROPOFOL 1000 MG/100ML IV EMUL
INTRAVENOUS | Status: AC
  Administered 2018-02-07: 20 ug/kg/min via INTRAVENOUS
  Filled 2018-02-07: qty 100

## 2018-02-07 MED ORDER — PROPOFOL 10 MG/ML IV BOLUS
80.0000 mg | Freq: Once | INTRAVENOUS | Status: AC
  Administered 2018-02-07: 80 mg via INTRAVENOUS

## 2018-02-07 MED ORDER — SODIUM CHLORIDE 0.9 % IV BOLUS
1000.0000 mL | Freq: Once | INTRAVENOUS | Status: AC
  Administered 2018-02-07: 1000 mL via INTRAVENOUS

## 2018-02-07 MED ORDER — SODIUM CHLORIDE 0.9 % IV SOLN
1.0000 g | Freq: Once | INTRAVENOUS | Status: AC
  Administered 2018-02-07: 1 g via INTRAVENOUS
  Filled 2018-02-07: qty 10

## 2018-02-07 MED ORDER — FENTANYL 2500MCG IN NS 250ML (10MCG/ML) PREMIX INFUSION
0.0000 ug/h | INTRAVENOUS | Status: DC
  Administered 2018-02-07 – 2018-02-08 (×2): 150 ug/h via INTRAVENOUS
  Filled 2018-02-07 (×2): qty 250

## 2018-02-07 MED ORDER — PROPOFOL 1000 MG/100ML IV EMUL
5.0000 ug/kg/min | INTRAVENOUS | Status: DC
  Administered 2018-02-07 – 2018-02-08 (×4): 20 ug/kg/min via INTRAVENOUS
  Filled 2018-02-07 (×3): qty 100

## 2018-02-07 MED ORDER — ORAL CARE MOUTH RINSE
15.0000 mL | OROMUCOSAL | Status: DC
  Administered 2018-02-07 – 2018-02-08 (×10): 15 mL via OROMUCOSAL

## 2018-02-07 MED ORDER — INSULIN ASPART 100 UNIT/ML ~~LOC~~ SOLN
5.0000 [IU] | Freq: Once | SUBCUTANEOUS | Status: AC
  Administered 2018-02-07: 5 [IU] via INTRAVENOUS
  Filled 2018-02-07: qty 1

## 2018-02-07 MED ORDER — SODIUM CHLORIDE 0.9 % IV SOLN: 1.0000 g | Freq: Once | INTRAVENOUS | Status: DC

## 2018-02-07 MED ORDER — FENTANYL CITRATE (PF) 100 MCG/2ML IJ SOLN
200.0000 ug | Freq: Once | INTRAMUSCULAR | Status: AC
  Administered 2018-02-07: 200 ug via INTRAVENOUS

## 2018-02-07 MED ORDER — SODIUM CHLORIDE 0.9 % IV SOLN
1.0000 g | Freq: Once | INTRAVENOUS | Status: AC
  Administered 2018-02-08: 1 g via INTRAVENOUS
  Filled 2018-02-07: qty 1

## 2018-02-07 MED ORDER — SODIUM CHLORIDE 0.9 % IV SOLN
1.0000 g | INTRAVENOUS | Status: AC
  Administered 2018-02-07 (×3): 1 g via INTRAVENOUS
  Filled 2018-02-07 (×3): qty 10

## 2018-02-07 MED ORDER — PANTOPRAZOLE SODIUM 40 MG IV SOLR
40.0000 mg | Freq: Once | INTRAVENOUS | Status: AC
  Administered 2018-02-07: 40 mg via INTRAVENOUS
  Filled 2018-02-07: qty 40

## 2018-02-07 MED ORDER — METOCLOPRAMIDE HCL 5 MG/ML IJ SOLN
10.0000 mg | Freq: Once | INTRAMUSCULAR | Status: AC
  Administered 2018-02-07: 10 mg via INTRAVENOUS
  Filled 2018-02-07: qty 2

## 2018-02-07 MED ORDER — VITAMIN K1 10 MG/ML IJ SOLN
10.0000 mg | Freq: Once | INTRAVENOUS | Status: AC
  Administered 2018-02-07: 10 mg via INTRAVENOUS
  Filled 2018-02-07: qty 1

## 2018-02-07 MED ORDER — NOREPINEPHRINE 4 MG/250ML-% IV SOLN: 0.0000 ug/min | INTRAVENOUS | Status: DC

## 2018-02-07 MED ORDER — SODIUM CHLORIDE 0.9 % IV SOLN
Freq: Once | INTRAVENOUS | Status: AC
  Administered 2018-02-07: 05:00:00 via INTRAVENOUS

## 2018-02-07 MED ORDER — SODIUM CHLORIDE 0.9 % IV SOLN
50.0000 ug/h | INTRAVENOUS | Status: DC
  Administered 2018-02-07 – 2018-02-09 (×7): 50 ug/h via INTRAVENOUS
  Filled 2018-02-07 (×15): qty 1

## 2018-02-07 MED ORDER — PROTHROMBIN COMPLEX CONC HUMAN 500 UNITS IV KIT
2000.0000 [IU] | PACK | Freq: Once | Status: AC
  Administered 2018-02-07: 2000 [IU] via INTRAVENOUS
  Filled 2018-02-07: qty 2000

## 2018-02-07 MED ORDER — KETAMINE HCL 10 MG/ML IJ SOLN
200.0000 mg | Freq: Once | INTRAMUSCULAR | Status: AC
  Administered 2018-02-07: 200 mg via INTRAVENOUS

## 2018-02-07 MED ORDER — STERILE WATER FOR INJECTION IV SOLN
INTRAVENOUS | Status: DC
  Administered 2018-02-07 – 2018-02-09 (×6): via INTRAVENOUS
  Filled 2018-02-07 (×9): qty 850

## 2018-02-07 MED ORDER — SODIUM CHLORIDE 0.9 % NICU IV INFUSION SIMPLE
1000.0000 mL | INJECTION | Freq: Once | INTRAVENOUS | Status: DC
  Filled 2018-02-07: qty 1000

## 2018-02-07 MED ORDER — ONDANSETRON HCL 4 MG/2ML IJ SOLN: 4.0000 mg | Freq: Four times a day (QID) | INTRAMUSCULAR | Status: DC | PRN

## 2018-02-07 MED ORDER — OCTREOTIDE LOAD VIA INFUSION
50.0000 ug | Freq: Once | INTRAVENOUS | Status: AC
  Administered 2018-02-07: 50 ug via INTRAVENOUS
  Filled 2018-02-07: qty 25

## 2018-02-07 MED ORDER — FENTANYL CITRATE (PF) 100 MCG/2ML IJ SOLN
150.0000 ug | Freq: Once | INTRAMUSCULAR | Status: AC
  Administered 2018-02-07: 150 ug via INTRAVENOUS

## 2018-02-07 MED ORDER — TRANEXAMIC ACID-NACL 1000-0.7 MG/100ML-% IV SOLN
1000.0000 mg | Freq: Once | INTRAVENOUS | Status: AC
  Administered 2018-02-07: 1000 mg via INTRAVENOUS
  Filled 2018-02-07: qty 100

## 2018-02-07 MED ORDER — DEXTROSE 50 % IV SOLN
2.0000 | Freq: Once | INTRAVENOUS | Status: AC
  Administered 2018-02-07: 100 mL via INTRAVENOUS
  Filled 2018-02-07: qty 100

## 2018-02-07 NOTE — ED Notes (Signed)
Pt to ct with rn, rt and ed tech.

## 2018-02-07 NOTE — Progress Notes (Signed)
Pt transported to CT and back to room without any incident , Post ABG fio2 to 50% tolerating well.

## 2018-02-07 NOTE — ED Notes (Signed)
Critical hemoglobin of 4.7 called from lab. md aware.

## 2018-02-07 NOTE — ED Triage Notes (Signed)
Pt very sleepy, moans.

## 2018-02-07 NOTE — ED Notes (Signed)
See green sheets that come with blood units for unit numbers, type and time administered. Blood administered via level one rapid infuser emergently.

## 2018-02-07 NOTE — Consult Note (Signed)
Name: Nathan [name]. MRN: [id number] DOB: 1950-10-23     CONSULTATION DATE: 02/07/2018  HISTORY OF PRESENT ILLNESS:   67 years old gentleman with history of A. fib/a flutter status post DCCV on Xarelto for anticoagulation, HFrEF 35 to 40% with grade 1 diastolic dysfunction, CKD stage III.  Patient presented to ED with GI bleeding (hematemesis and melena) hypotension hemorrhagic shock and altered mental status.  While in the ED had to be intubated for airway protection, CT head was obtained with no acute intracranial abnormalities and he had received multiple units of transfusion.  Because of coagulopathy he received [place] + Andexxa and vitamin K.  Potassium was elevated, had received temporizing agents for hyperkalemia and had to be started on bicarb drip because of severe metabolic acidosis was worsening kidney function.  All history was obtained from admitting physician and EMR.  Patient arrived to the intensive care unit on the ventilator, sedated with propofol + fentanyl and on octreotide drip. PAST MEDICAL HISTORY :   has a past medical history of Atrial flutter ([place]), Chronic combined systolic and diastolic CHF (congestive heart failure) ([place]), Essential hypertension, and Pulmonary hypertension ([place]).  has a past surgical history that includes TEE without cardioversion (N/A, 05/01/2017); CARDIOVERSION (N/A, 05/01/2017); TEE without cardioversion (N/A, 06/16/2017); A-FLUTTER ABLATION (N/A, 06/16/2017); RIGHT/LEFT HEART CATH AND CORONARY ANGIOGRAPHY (N/A, 07/18/2017); Cardiac catheterization; and Coronary angioplasty. Prior to Admission medications   Medication Sig Start Date End Date Taking? Authorizing Provider  furosemide (LASIX) 40 MG tablet Take 1 tablet (40 mg total) by mouth 2 (two) times daily. 12/31/17  Yes [name], MD  losartan (COZAAR) 100 MG tablet Take 100 mg by mouth daily.   Yes [provider]  rivaroxaban (XARELTO) 20 MG TABS tablet Take 1 tablet (20 mg total) by  mouth daily with supper. 07/22/17  Yes [name], MD  atorvastatin (LIPITOR) 40 MG tablet Take 1 tablet (40 mg total) by mouth daily at 6 PM. 07/22/17   Gollan, Kathlene November, MD  metoprolol succinate (TOPROL-XL) 25 MG 24 hr tablet Take 25 mg by mouth 2 (two) times daily.    [provider]  potassium chloride SA (K-DUR,KLOR-CON) 20 MEQ tablet Take 20 mEq by mouth 2 (two) times daily.    [provider]  spironolactone (ALDACTONE) 25 MG tablet Take 25 mg by mouth daily.    [provider]   No Known Allergies  FAMILY HISTORY:  family history includes Alzheimer's disease in his father and mother. SOCIAL HISTORY:  reports that he quit smoking about 9 months ago. He has never used smokeless tobacco. He reports that he drinks about 3.0 standard drinks of alcohol per week. He reports that he does not use drugs.  REVIEW OF SYSTEMS:   Unable to obtain due to critical illness   VITAL SIGNS: Temp:  [92.4 F (33.6 C)-98.3 F (36.8 C)] 97.8 F (36.6 C) (11/23 1010) Pulse Rate:  [67-95] 69 (11/23 1010) Resp:  [11-25] 15 (11/23 1010) BP: (72-133)/(49-84) 115/66 (11/23 1010) SpO2:  [94 %-100 %] 100 % (11/23 1010) FiO2 (%):  [50 %-100 %] 50 % (11/23 0900) Weight:  [106.8 kg-108.9 kg] 106.8 kg (11/23 0824)  Physical Examination:  RASS of -3.  As reported by nursing patient was able to move extremities following command on light sedation On the vent, no distress, bilateral equal air entry with no adventitious sounds S1 & S2 are audible with no murmur Benign abdominal exam with feeble peristalsis Wasted extremities and  no leg edema   ASSESSMENT / PLAN: Acute respiratory failure intubated for airway protection with altered mental status -Monitor ABG, optimize vent later settings and continuous ventilator support  Altered mental status with toxic metabolic encephalopathy and cerebral hypoperfusion.  CT head did not show acute intracranial abnormalities -Monitor neuro  status  Acute blood loss anemia with hemorrhagic shock. -Liberal policy for massive blood transfusion to keep hemoglobin more than 9 g/dL -Monitor hemodynamic consider vasopressors to maintain systolic blood pressure more than 100  GI bleeding. -PPI + octreotide -Awaiting endoscopy and management as per GI  AKI, hyperkalemia and intravascular volume depletion on top of CKD -Optimize hydration, temporizing agent for hyperkalemia, avoid nephrotoxins, monitor renal panel and urine output -Consider nephrology consult  Metabolic acidosis with lactic acidemia.  Possible sepsis versus tissue hypoperfusion. -Optimize bicarbonate based solution, hydration. -empiric Rocephin  -monitor lactic acid, procalcitonin and cultures  HFrEF 35 to 40%, A. fib status post DCCV. -Rate control -Watch for volume overload  Coagulopathy with Xarelto for A. Fib.  S/P reversal with Andexxa + Kcentra + vitamin K because of GI bleeding -Monitor coags  Full code  DVT and GI prophylaxis.  Continue supportive care  Critical care time 50 minutes

## 2018-02-07 NOTE — ED Notes (Signed)
Bear hugger initiated

## 2018-02-07 NOTE — ED Notes (Signed)
First unit of emergent o positive blood going in via rapid infuser.

## 2018-02-07 NOTE — Consult Note (Signed)
Nathan [name] , MD [address] [place], [place], [place], [id number], [place], [place], [place], [address] Phone: [phone]  Fax: [phone]  Consultation  Referring Provider:    ICU  Primary Care Physician:  Patient, [name] Primary Gastroenterologist:  None          Reason for Consultation:     GI bleed   Date of Admission:  02/07/2018 Date of Consultation:  02/07/2018         HPI:   Nathan Passey. is a 67 y.o. male has a history of atrial fibrillation on Xarelto.  CHF.,  CKD.  I was informed in the emergency room that he has been admitted for hematemesis and melena.  At the time of the call me from the emergency room at around 5:30 in the morning the ER attending mentioned that after intubation the NG tube was returned no blood from the stomach.  He had been hypotensive hemoglobin on arrival was 4.7 g and an INR of 2.3.  He was subsequently admitted to the ICU.  I called and checked with the ICU around 10 AM to check if any more hematemesis or melena was noted the nursing charge mentioned no further bleeding noted at this point of time.  The patient has been given blood, platelets, vitamin K, Kcentra, and Andexxa.   In the icu intubated and sedated, no further bleeding noted, OG on suction has been negative for blood.   Past Medical History:  Diagnosis Date  . Atrial flutter ([place])    a. s/p TEE/DCCV 04/2017; b. CHADS2VASc => 3 (CHF, HTN, age x 1); c. not compliant with Xarelto  . Chronic combined systolic and diastolic CHF (congestive heart failure) ([place])    a. TTE 2/19: EF 20-25%, diffuse HK, mod dilated LA, mildly reduced RVSF, PASP 42; b. TTE 3/19: EF 20-25%, diffuse HK, RVSF nl, atrial flutter with RVR  . Essential hypertension   . Pulmonary hypertension ([place])     Past Surgical History:  Procedure Laterality Date  . A-FLUTTER ABLATION N/A 06/16/2017   Procedure: A-FLUTTER ABLATION;  Surgeon: [name], MD;  Location: [facility];  Service:  Cardiovascular;  Laterality: N/A;  . CARDIAC CATHETERIZATION    . CARDIOVERSION N/A 05/01/2017   Procedure: CARDIOVERSION;  Surgeon: [name], MD;  Location: [facility];  Service: Cardiovascular;  Laterality: N/A;  . CORONARY ANGIOPLASTY    . RIGHT/LEFT HEART CATH AND CORONARY ANGIOGRAPHY N/A 07/18/2017   Procedure: RIGHT/LEFT HEART CATH AND CORONARY ANGIOGRAPHY;  Surgeon: [name], MD;  Location: [facility];  Service: Cardiovascular;  Laterality: N/A;  . TEE WITHOUT CARDIOVERSION N/A 05/01/2017   Procedure: TRANSESOPHAGEAL ECHOCARDIOGRAM (TEE);  Surgeon: [name], MD;  Location: [facility];  Service: Cardiovascular;  Laterality: N/A;  . TEE WITHOUT CARDIOVERSION N/A 06/16/2017   Procedure: TRANSESOPHAGEAL ECHOCARDIOGRAM (TEE);  Surgeon: [name], MD;  Location: [facility];  Service: Cardiovascular;  Laterality: N/A;    Prior to Admission medications   Medication Sig Start Date End Date Taking? Authorizing Provider  furosemide (LASIX) 40 MG tablet Take 1 tablet (40 mg total) by mouth 2 (two) times daily. 12/31/17  Yes [name], MD  losartan (COZAAR) 100 MG tablet Take 100 mg by mouth daily.   Yes [provider]  rivaroxaban (XARELTO) 20 MG TABS tablet Take 1 tablet (20 mg total) by mouth daily with supper. 07/22/17  Yes [name], MD  atorvastatin (LIPITOR) 40 MG tablet Take 1 tablet (40 mg total) by mouth daily at 6 PM. 07/22/17   Gollan, Kathlene November, MD  metoprolol succinate (TOPROL-XL) 25 MG 24 hr tablet Take 25 mg by mouth 2 (two) times daily.    [provider]  potassium chloride SA (K-DUR,KLOR-CON) 20 MEQ tablet Take 20 mEq by mouth 2 (two) times daily.    [provider]  spironolactone (ALDACTONE) 25 MG tablet Take 25 mg by mouth daily.    [provider]    Family History  Problem Relation Age of Onset  . Alzheimer's disease Mother   . Alzheimer's disease Father      Social History    Tobacco Use  . Smoking status: Former Smoker    Last attempt to quit: 04/21/2017    Years since quitting: 0.8  . Smokeless tobacco: Never Used  Substance Use Topics  . Alcohol use: Yes    Alcohol/week: 3.0 standard drinks    Types: 3 Cans of beer per week    Frequency: Never    Comment: Drink a half of a 40oz beer every day, drank heavily in the past  . Drug use: Never    Allergies as of 02/07/2018  . (No Known Allergies)    Review of Systems:    Cannot assess   Physical Exam:  Vital signs in last 24 hours: Temp:  [92.4 F (33.6 C)-98.3 F (36.8 C)] 97.8 F (36.6 C) (11/23 1010) Pulse Rate:  [67-95] 69 (11/23 1010) Resp:  [11-25] 15 (11/23 1010) BP: (72-133)/(49-84) 115/66 (11/23 1010) SpO2:  [94 %-100 %] 100 % (11/23 1010) FiO2 (%):  [50 %-100 %] 50 % (11/23 0900) Weight:  [106.8 kg-108.9 kg] 106.8 kg (11/23 0824)   General:  Intubated and sedated  Head:  Normocephalic and atraumatic. Eyes:   No icterus.   Conjunctiva pink. PERRLA. Ears:  Cannot assess Neck:  Supple; no masses or thyroidomegaly Lungs: Respirations even and unlabored. Lungs clear to auscultation bilaterally.   No wheezes, crackles, or rhonchi.  Heart:  Regular rate and rhythm;  Without murmur, clicks, rubs or gallops Abdomen:  Soft, nondistended, nontender. Normal bowel sounds. No appreciable masses or hepatomegaly.  No rebound or guarding.  Neurologic:  Intubated and sedated  Skin:  Intact without significant lesions or rashes. Cervical Nodes:  No significant cervical adenopathy. Psych:  Cannot assess  LAB RESULTS: Recent Labs    02/07/18 0430 02/07/18 0618  WBC 7.2  --   HGB 4.7* 7.1*  HCT 15.2* 21.5*  PLT 316  --    BMET Recent Labs    02/07/18 0430  NA 140  K 5.6*  CL 109  CO2 12*  GLUCOSE 136*  BUN 54*  CREATININE 3.83*  CALCIUM 8.5*   LFT Recent Labs    02/07/18 0430  PROT 6.6  ALBUMIN 3.3*  AST 30  ALT 14  ALKPHOS 38  BILITOT 0.5   PT/INR Recent Labs     02/07/18 0430  LABPROT 24.9*  INR 2.29    STUDIES: Ct Head Wo Contrast  Result Date: 02/07/2018 CLINICAL DATA:  Hematemesis for 1 week. On Xarelto. Altered level of consciousness. EXAM: CT HEAD WITHOUT CONTRAST TECHNIQUE: Contiguous axial images were obtained from the base of the skull through the vertex without intravenous contrast. COMPARISON:  None. FINDINGS: Brain: Mild cerebral atrophy. Ventricular dilatation consistent with central atrophy. Low-attenuation changes in the deep white matter consistent with small vessel ischemia. No mass effect or midline shift. No  abnormal extra-axial fluid collections. Gray-white matter junctions are distinct. Basal cisterns are not effaced. No acute intracranial hemorrhage. Vascular: Moderately severe intracranial arterial calcifications are present. Skull: Calvarium appears intact. No acute depressed skull fractures. Sinuses/Orbits: Paranasal sinuses and mastoid air cells are clear. Diffuse opacification of the visualized nasopharynx with low-attenuation material possibly representing fluid or edema. This is incompletely included within the field of view. Suggest CT neck with contrast for further evaluation to exclude neoplasm versus inflammatory etiology. Other: None. IMPRESSION: 1. No acute intracranial abnormalities. Mild chronic atrophy and small vessel ischemic changes. 2. Opacification of the nasopharynx of nonspecific etiology, incompletely included within the field of view. Suggest CT neck with contrast for further evaluation. Electronically Signed   By: Lucienne Capers M.D.   On: 02/07/2018 06:13   Dg Chest Portable 1 View  Result Date: 02/07/2018 CLINICAL DATA:  Intubation. Hematemesis for 1 week. History of congestive heart failure and hypertension. EXAM: PORTABLE CHEST 1 VIEW COMPARISON:  07/15/2017 FINDINGS: Endotracheal tube tip measures 3.5 cm above the carina. Enteric tube tip is over the left upper quadrant consistent with location in the  upper stomach. Shallow inspiration. Heart size and pulmonary vascularity are normal for technique. No airspace disease or consolidation in the lungs. No blunting of costophrenic angles. No pneumothorax. Mediastinal contours appear intact. IMPRESSION: Appliances appear in satisfactory location. No evidence of active pulmonary disease. Electronically Signed   By: Lucienne Capers M.D.   On: 02/07/2018 05:31      Impression / Plan:   Nathan Geraghty. is a 67 y.o. y/o male admitted to the ICU earlier this morning intubated when he presented to emergency room with hypotension severe anemia and a history of melena.  He has been on Xarelto and labs suggest an AKI.  INR elevated.  He is not on Coumadin.  CBC shows a normal platelet count, albumin is not markedly low on  labs and this does not suggest a chronic liver disease.  The pattern of his labs makes him less likely to have chronic liver disease and it being a variceal bleed.  It is very likely that the INR is elevated due to the effects of Xarelto and due to the effects of renal impairment, larger quantity of Xarelto has been retained in his system and has not been excreted.    In terms of reversal there is no role for platelet transfusion as Xarelto does not affect platelets.  There is no role for Kcentra either.  I do note Andexxa has been administered which has an action of only 2 hours in duration.  Following this the factor anti-Xa levels return back to the pre-drug administered levels.    At this point of time the patient is not showing any overt evidence of a GI bleed.  By overt evidence I mean no blood coming out of his NG tube and no massive melena and his blood counts are holding stable.  I would think the timing for his endoscopy would be appropriate when the Xarelto is off his body and system which would be at least 2 days if not longer due to his impaired renal function.  I would not perform an elective upper endoscopy prior to that as any  endoscopy therapy would probably increase the risk of bleeding due to presence of Xarelto in the body.  If he were to show signs of active bleeding then we would have no choice but to intervene and try and stop the bleeding the best of our  ability.  In the meanwhile I would monitor his CBC and transfuse as needed.  Continue PPI, there is no need to administer any further FFP or platelets at this point of time  Thank you for involving me in the care of this patient.      LOS: 0 days   Nathan Bellows, MD  02/07/2018, 10:53 AM

## 2018-02-07 NOTE — ED Provider Notes (Addendum)
[facility] Emergency Department Provider Note  ____________________________________________   First MD Initiated Contact with Patient 02/07/18 [phone]     (approximate)  I have reviewed the triage vital signs and the nursing notes.   HISTORY  Chief Complaint Hematemesis  Level 5 exemption history is limited by the patient's clinical condition  HPI Nathan [name]. is a 67 y.o. male who comes to the emergency department by EMS after apparently vomiting blood this morning.  The patient is unable to provide any history as he is profoundly obtunded.  Apparently the patient is taking rivaroxaban for atrial fibrillation.    Past Medical History:  Diagnosis Date  . Atrial flutter ([place])    a. s/p TEE/DCCV 04/2017; b. CHADS2VASc => 3 (CHF, HTN, age x 1); c. not compliant with Xarelto  . Chronic combined systolic and diastolic CHF (congestive heart failure) ([place])    a. TTE 2/19: EF 20-25%, diffuse HK, mod dilated LA, mildly reduced RVSF, PASP 42; b. TTE 3/19: EF 20-25%, diffuse HK, RVSF nl, atrial flutter with RVR  . Essential hypertension   . Pulmonary hypertension [facility])     Patient Active Problem List   Diagnosis Date Noted  . Hemorrhagic shock ([place]) 02/07/2018  . Dilated cardiomyopathy ([place]) 08/26/2017  . Chronic a-fib 07/15/2017  . Chronic systolic heart failure ([place]) 07/09/2017  . HTN (hypertension) 07/09/2017  . Tobacco use 07/09/2017  . Atrial flutter ([place]) 06/15/2017  . A-fib ([place]) 06/12/2017  . Atrial flutter with rapid ventricular response ([place]) 04/29/2017  . Elevated troponin 04/29/2017  . AKI (acute kidney injury) ([place]) 04/29/2017    Past Surgical History:  Procedure Laterality Date  . A-FLUTTER ABLATION N/A 06/16/2017   Procedure: A-FLUTTER ABLATION;  Surgeon: [name], MD;  Location: [place] CV LAB;  Service: Cardiovascular;  Laterality: N/A;  . CARDIAC CATHETERIZATION    . CARDIOVERSION N/A 05/01/2017   Procedure: CARDIOVERSION;   Surgeon: [name], MD;  Location: [facility] ORS;  Service: Cardiovascular;  Laterality: N/A;  . CORONARY ANGIOPLASTY    . RIGHT/LEFT HEART CATH AND CORONARY ANGIOGRAPHY N/A 07/18/2017   Procedure: RIGHT/LEFT HEART CATH AND CORONARY ANGIOGRAPHY;  Surgeon: [name], MD;  Location: [place] CV LAB;  Service: Cardiovascular;  Laterality: N/A;  . TEE WITHOUT CARDIOVERSION N/A 05/01/2017   Procedure: TRANSESOPHAGEAL ECHOCARDIOGRAM (TEE);  Surgeon: [name], MD;  Location: [facility] ORS;  Service: Cardiovascular;  Laterality: N/A;  . TEE WITHOUT CARDIOVERSION N/A 06/16/2017   Procedure: TRANSESOPHAGEAL ECHOCARDIOGRAM (TEE);  Surgeon: [name], MD;  Location: [facility] ENDOSCOPY;  Service: Cardiovascular;  Laterality: N/A;    Prior to Admission medications   Medication Sig Start Date End Date Taking? Authorizing Provider  furosemide (LASIX) 40 MG tablet Take 1 tablet (40 mg total) by mouth 2 (two) times daily. 12/31/17  Yes [name], MD  losartan (COZAAR) 100 MG tablet Take 100 mg by mouth daily.   Yes [provider]  rivaroxaban (XARELTO) 20 MG TABS tablet Take 1 tablet (20 mg total) by mouth daily with supper. 07/22/17  Yes [name], MD  atorvastatin (LIPITOR) 40 MG tablet Take 1 tablet (40 mg total) by mouth daily at 6 PM. 07/22/17   Gollan, Kathlene November, MD  metoprolol succinate (TOPROL-XL) 25 MG 24 hr tablet Take 25 mg by mouth 2 (two) times daily.    [provider]  potassium chloride SA (K-DUR,KLOR-CON) 20 MEQ tablet Take 20 mEq by mouth 2 (two) times daily.  [provider]  spironolactone (ALDACTONE) 25 MG tablet Take 25 mg by mouth daily.    [provider]    Allergies Patient has no known allergies.  Family History  Problem Relation Age of Onset  . Alzheimer's disease Mother   . Alzheimer's disease Father     Social History Social History   Tobacco Use  . Smoking status: Former Smoker    Last attempt to  quit: 04/21/2017    Years since quitting: 0.8  . Smokeless tobacco: Never Used  Substance Use Topics  . Alcohol use: Yes    Alcohol/week: 3.0 standard drinks    Types: 3 Cans of beer per week    Frequency: Never    Comment: Drink a half of a 40oz beer every day, drank heavily in the past  . Drug use: Never    Review of Systems Level 5 exemption history limited by the patient's clinical condition ____________________________________________   PHYSICAL EXAM:  VITAL SIGNS: ED Triage Vitals  Enc Vitals Group     BP --      Pulse --      Resp 02/07/18 0413 (!) 22     Temp --      Temp src --      SpO2 --      Weight 02/07/18 0414 240 lb (108.9 kg)     Height 02/07/18 0414 6\' 5"  (1.956 m)     Head Circumference --      Peak Flow --      Pain Score 02/07/18 0413 0     Pain Loc --      Pain Edu? --      Excl. in [place]? --     Constitutional: Appears critically ill.  Profoundly obtunded and lethargic.  Difficult to arouse and mumbling speaking only 1-2 words Eyes: PERRL EOMI. midrange and sluggish.  Icteric Head: Atraumatic. Nose: No congestion/rhinnorhea. Mouth/Throat: No trismus Neck: No stridor.   Cardiovascular: Normal rate, regular rhythm. Grossly normal heart sounds.  Good peripheral circulation. Respiratory: Decreased respiratory effort with sonorous respirations and actively aspirating  Gastrointestinal: Soft nontender. Copious amount of melena guaiac positive control positive on rectal exam Musculoskeletal: No lower extremity edema   Neurologic: Localizes to all 4 Skin:  Skin is warm, dry and intact. No rash noted. Psychiatric: Encephalopathic   ____________________________________________   DIFFERENTIAL includes but not limited to  Upper GI bleed, esophageal varices, lower GI bleed, sepsis, hyperammonemia, azotemia ____________________________________________   LABS (all labs ordered are listed, but only abnormal results are displayed)  Labs Reviewed    COMPREHENSIVE METABOLIC PANEL - Abnormal; Notable for the following components:      Result Value   Potassium 5.6 (*)    CO2 12 (*)    Glucose, Bld 136 (*)    BUN 54 (*)    Creatinine, Ser 3.83 (*)    Calcium 8.5 (*)    Albumin 3.3 (*)    GFR calc non Af Amer 15 (*)    GFR calc Af Amer 17 (*)    Anion gap 19 (*)    All other components within normal limits  PROTIME-INR - Abnormal; Notable for the following components:   Prothrombin Time 24.9 (*)    All other components within normal limits  CBC WITH DIFFERENTIAL/PLATELET - Abnormal; Notable for the following components:   RBC 1.60 (*)    Hemoglobin 4.7 (*)    HCT 15.2 (*)    nRBC 0.6 (*)    All other  components within normal limits  BLOOD GAS, ARTERIAL - Abnormal; Notable for the following components:   pCO2 arterial 30 (*)    pO2, Arterial 62 (*)    Bicarbonate 16.2 (*)    Acid-base deficit 8.3 (*)    All other components within normal limits  CG4 I-STAT (LACTIC ACID) - Abnormal; Notable for the following components:   Lactic Acid, Venous 5.69 (*)    All other components within normal limits  MRSA PCR SCREENING  TROPONIN I  ETHANOL  AMMONIA  LIPASE, BLOOD  FIBRINOGEN  BLOOD GAS, VENOUS  HEMOGLOBIN AND HEMATOCRIT, BLOOD  APTT  HIV ANTIBODY (ROUTINE TESTING W REFLEX)  FIBRIN DERIVATIVES D-DIMER ([facility] ONLY)  I-STAT CG4 LACTIC ACID, ED  TYPE AND SCREEN  PREPARE RBC (CROSSMATCH)  MASSIVE TRANSFUSION PROTOCOL ORDER (BLOOD BANK NOTIFICATION)  PREPARE FRESH FROZEN PLASMA  ABO/RH  PREPARE FRESH FROZEN PLASMA  PREPARE PLATELET PHERESIS    Lab work reviewed by me shows elevated lactic acid likely secondary to under resuscitation and not sepsis.  His INR is 2.2 which is consistent with cirrhosis __________________________________________  EKG  ED ECG REPORT I, Darel Hong, the attending physician, personally viewed and interpreted this ECG.  Date: 02/07/2018 EKG Time:  Rate: 78 Rhythm: normal sinus rhythm QRS  Axis: normal Intervals: normal ST/T Wave abnormalities: normal Narrative Interpretation: no evidence of acute ischemia  ____________________________________________  RADIOLOGY  Chest x-ray reviewed by me with ET tube and OG tube in good position Head CT reviewed by me with no acute disease noted ____________________________________________   PROCEDURES  Procedure(s) performed: Yes  .Critical Care Performed by: Darel Hong, MD Authorized by: Darel Hong, MD   Critical care provider statement:    Critical care time (minutes):  70   Critical care time was exclusive of:  Separately billable procedures and treating other patients   Critical care was necessary to treat or prevent imminent or life-threatening deterioration of the following conditions:  Shock (Acute GI hemorrhage)   Critical care was time spent personally by me on the following activities:  Development of treatment plan with patient or surrogate, discussions with consultants, evaluation of patient's response to treatment, examination of patient, obtaining history from patient or surrogate, ordering and performing treatments and interventions, ordering and review of laboratory studies, ordering and review of radiographic studies, pulse oximetry, re-evaluation of patient's condition and review of old charts Procedure Name: Intubation Date/Time: 02/07/2018 5:10 AM Performed by: Darel Hong, MD Pre-anesthesia Checklist: Patient identified, Patient being monitored, Emergency Drugs available, Timeout performed and Suction available Oxygen Delivery Method: Ambu bag Preoxygenation: Pre-oxygenation with 100% oxygen Induction Type: Rapid sequence Ventilation: Mask ventilation without difficulty Laryngoscope Size: Miller and 4 Tube size: 8.0 mm Number of attempts: 1 Placement Confirmation: ETT inserted through vocal cords under direct vision,  CO2 detector and Breath sounds checked- equal and bilateral Secured at: 25  cm Tube secured with: ETT holder    .Central Line Date/Time: 02/07/2018 5:10 AM Performed by: Darel Hong, MD Authorized by: Darel Hong, MD   Consent:    Consent obtained:  Emergent situation Pre-procedure details:    Hand hygiene: Hand hygiene performed prior to insertion     All elements of maximal sterile barrier technique followed: sterile gloves, cap, mask worn.  no gown.     Skin preparation:  2% chlorhexidine   Skin preparation agent: Skin preparation agent completely dried prior to procedure   Sedation:    Sedation type:  Deep Procedure details:    Location:  R  femoral   Patient position:  Trendelenburg   Procedural supplies:  Triple lumen and Cordis   Landmarks identified: yes     Ultrasound guidance: no     Number of attempts:  1   Successful placement: yes   Post-procedure details:    Post-procedure:  Dressing applied and line sutured   Assessment:  Blood return through all ports and free fluid flow   Patient tolerance of procedure:  Tolerated well, no immediate complications    Critical Care performed: Yes  ____________________________________________   INITIAL IMPRESSION / ASSESSMENT AND PLAN / ED COURSE  Pertinent labs & imaging results that were available during my care of the patient were reviewed by me and considered in my medical decision making (see chart for details).   As part of my medical decision making, I reviewed the following data within the [place] History obtained from family if available, nursing notes, old chart and ekg, as well as notes from prior ED visits.  The patient comes to the emergency department lethargic, obtunded, sonorous respirations, and appears critically ill.  He has a reported history of upper GI bleed and on exam he has copious melena.  His first blood pressure was 90/60 however his second was 60/40 so I ordered 2 units of emergent O+ blood to begin resuscitation.  He required emergent  endotracheal intubation given his respiratory status and his altered mental status.  Intubated without complication and we continued resuscitation with blood products.  Patient has no known history of cirrhosis although does take rivaroxaban so I initially ordered tranexamic acid.  His INR came back elevated at 2.3 raising concern for new cirrhosis given his history of alcohol abuse so I added on octreotide Protonix and ceftriaxone as well.  He was sedated with fentanyl and propofol infusions.  His blood work came back showing a hemoglobin of 4.7 down from a previous of 12 concerning for severe hemorrhage.  Given his rivaroxaban I then ordered andexxa and reached out to Dr. Vicente Males on-call for GI who agrees with continued aggressive resuscitation with massive transfusion protocol and he will scope the patient in the ICU.  His OG tube is not putting out much blood and at this point I doubt esophageal varices.  I discussed with the hospitalist Dr. Oren Bracket who has graciously agreed to admit the patient to his service.  ----------------------------------------- 6:58 AM on 02/07/2018 -----------------------------------------  The patient's lab work is back showing acute kidney injury with a K of 5.6 which is likely exacerbated by his now 6 units of packed red cells.  I have given him 5 units of insulin as well as 2 A of D50 and 3 g of calcium chloride to help abate this hyperkalemia.  The patient has a bed in the ICU although currently is critically ill.  We have attempted to reach out to family but have been unable to locate anyone.    I appreciate the patient's elevated lactic acid level however he has no signs of sepsis and this is secondary to under resuscitation and hemorrhagic shock.  No indication to repeat lactic acid, no indication for antibiotics beyond the ceftriaxone for possible cirrhotic GI bleed, and no indication for blood cultures.      ____________________________________________   FINAL  CLINICAL IMPRESSION(S) / ED DIAGNOSES  Final diagnoses:  Hemorrhagic shock ([place])  Hypothermia, initial encounter  Upper GI bleed  Lactic acidosis  Acute kidney injury ([place])  Hyperkalemia      NEW MEDICATIONS STARTED DURING  THIS VISIT:  New Prescriptions   No medications on file     Note:  This document was prepared using Dragon voice recognition software and may include unintentional dictation errors.     Darel Hong, MD 02/07/18 [id number]    Darel Hong, MD 02/07/18 [id number]    Darel Hong, MD 02/07/18 0745

## 2018-02-07 NOTE — ED Notes (Signed)
Second unit of blood infusing via rapid infuser.

## 2018-02-07 NOTE — ED Notes (Signed)
One unit of o neg started for MTP. Unit number [id number], exp date 02/10/2018, volume 419mL.

## 2018-02-07 NOTE — ED Notes (Signed)
Fourth unit of o negative via MTP given to make 6 units of PRBC total. Unit number [id number]

## 2018-02-07 NOTE — H&P (Signed)
[place] [facility] [place] NAME: Nathan [name]    MR#:  [id number]  DATE OF BIRTH:  04-12-1950  DATE OF ADMISSION:  02/07/2018  PRIMARY CARE PHYSICIAN: Patient, [name]   REQUESTING/REFERRING PHYSICIAN: Darel Hong, MD  CHIEF COMPLAINT:   Chief Complaint  Patient presents with  . Hematemesis    HISTORY OF PRESENT ILLNESS:  Nathan [name]  is a 67 y.o. male with a known history of AFib/AFlutter (s/p DCCV; persistent; Xarelto), chronic systolic + diastolic CHF (EF 71-06% w/ grade I diastolic dysfxn as of 26/94/8546 Echo), CKD III p/w hematemesis, melena, encephalopathy, hypotension, hemorrhagic shock, acute blood loss anemia. Pt already intubated at the time of my assessment, and cannot provide Hx/ROS. Pt reportedly w/ 1wk Hx hematemesis. Melena on rectal exam. No known Hx of GI/liver pathology. Severely hypotensive and encephalopathic, intubated on ED arrival. Hgb 4.7. INR 2.3. Massive txfn protocol initiated. Admit to ICU.  PAST MEDICAL HISTORY:   Past Medical History:  Diagnosis Date  . Atrial flutter ([place])    a. s/p TEE/DCCV 04/2017; b. CHADS2VASc => 3 (CHF, HTN, age x 1); c. not compliant with Xarelto  . Chronic combined systolic and diastolic CHF (congestive heart failure) ([place])    a. TTE 2/19: EF 20-25%, diffuse HK, mod dilated LA, mildly reduced RVSF, PASP 42; b. TTE 3/19: EF 20-25%, diffuse HK, RVSF nl, atrial flutter with RVR  . Essential hypertension   . Pulmonary hypertension ([place])     PAST SURGICAL HISTORY:   Past Surgical History:  Procedure Laterality Date  . A-FLUTTER ABLATION N/A 06/16/2017   Procedure: A-FLUTTER ABLATION;  Surgeon: Nathan Lance, MD;  Location: [facility];  Service: Cardiovascular;  Laterality: N/A;  . CARDIAC CATHETERIZATION    . CARDIOVERSION N/A 05/01/2017   Procedure: CARDIOVERSION;  Surgeon: Nathan Merritts, MD;  Location: [facility] ORS;  Service: Cardiovascular;  Laterality: N/A;  . CORONARY  ANGIOPLASTY    . RIGHT/LEFT HEART CATH AND CORONARY ANGIOGRAPHY N/A 07/18/2017   Procedure: RIGHT/LEFT HEART CATH AND CORONARY ANGIOGRAPHY;  Surgeon: Nathan Hampshire, MD;  Location: [facility];  Service: Cardiovascular;  Laterality: N/A;  . TEE WITHOUT CARDIOVERSION N/A 05/01/2017   Procedure: TRANSESOPHAGEAL ECHOCARDIOGRAM (TEE);  Surgeon: Nathan Merritts, MD;  Location: [facility] ORS;  Service: Cardiovascular;  Laterality: N/A;  . TEE WITHOUT CARDIOVERSION N/A 06/16/2017   Procedure: TRANSESOPHAGEAL ECHOCARDIOGRAM (TEE);  Surgeon: Nathan Spark, MD;  Location: [facility] ENDOSCOPY;  Service: Cardiovascular;  Laterality: N/A;    SOCIAL HISTORY:   Social History   Tobacco Use  . Smoking status: Former Smoker    Last attempt to quit: 04/21/2017    Years since quitting: 0.8  . Smokeless tobacco: Never Used  Substance Use Topics  . Alcohol use: Yes    Alcohol/week: 3.0 standard drinks    Types: 3 Cans of beer per week    Frequency: Never    Comment: Drink a half of a 40oz beer every day, drank heavily in the past    FAMILY HISTORY:   Family History  Problem Relation Age of Onset  . Alzheimer's disease Mother   . Alzheimer's disease Father     DRUG ALLERGIES:  No Known Allergies  REVIEW OF SYSTEMS:   Review of Systems  Unable to perform ROS: Intubated  Gastrointestinal: Positive for melena and vomiting.   MEDICATIONS AT HOME:   Prior to Admission medications   Medication Sig Start Date End Date  Taking? Authorizing Provider  furosemide (LASIX) 40 MG tablet Take 1 tablet (40 mg total) by mouth 2 (two) times daily. 12/31/17  Yes Nathan Merritts, MD  losartan (COZAAR) 100 MG tablet Take 100 mg by mouth daily.   Yes [provider]  rivaroxaban (XARELTO) 20 MG TABS tablet Take 1 tablet (20 mg total) by mouth daily with supper. 07/22/17  Yes Nathan Merritts, MD  atorvastatin (LIPITOR) 40 MG tablet Take 1 tablet (40 mg total) by mouth daily at 6 PM. 07/22/17   Gollan,  Kathlene November, MD  metoprolol succinate (TOPROL-XL) 25 MG 24 hr tablet Take 25 mg by mouth 2 (two) times daily.    [provider]  potassium chloride SA (K-DUR,KLOR-CON) 20 MEQ tablet Take 20 mEq by mouth 2 (two) times daily.    [provider]  spironolactone (ALDACTONE) 25 MG tablet Take 25 mg by mouth daily.    [provider]      VITAL SIGNS:  Blood pressure 133/62, pulse 76, temperature 98.3 F (36.8 C), temperature source Rectal, resp. rate 15, height 6\' 2"  (1.88 m), weight 106.8 kg, SpO2 94 %.  PHYSICAL EXAMINATION:  Physical Exam  Constitutional: He appears well-developed and well-nourished. He is sleeping.  Non-toxic appearance. No distress. He is sedated and intubated.  HENT:  Head: Normocephalic and atraumatic.  Eyes: Conjunctivae and lids are normal. No scleral icterus.  Neck: Neck supple. No JVD present. No thyromegaly present.  Cardiovascular: Normal rate, regular rhythm, S1 normal, S2 normal and normal heart sounds.  No extrasystoles are present. Exam reveals no gallop, no S3, no S4, no distant heart sounds and no friction rub.  No murmur heard. Pulmonary/Chest: Breath sounds normal. No accessory muscle usage or stridor. No apnea, no tachypnea and no bradypnea. He is intubated. He has no decreased breath sounds. He has no wheezes. He has no rhonchi. He has no rales.  Abdominal: Soft. He exhibits no distension. Bowel sounds are decreased. There is no tenderness. There is no rigidity, no rebound and no guarding.  Musculoskeletal: He exhibits no edema.  Lymphadenopathy:    He has no cervical adenopathy.  Neurological: He is unresponsive.  Intubated/sedated.  Skin: Skin is warm and dry. No rash noted. He is not diaphoretic. No erythema. There is pallor.  Psychiatric: He is noncommunicative.  Intubated/sedated. He is inattentive.   LABORATORY PANEL:   CBC Recent Labs  Lab 02/07/18 0430 02/07/18 0618  WBC 7.2  --   HGB 4.7* 7.1*  HCT 15.2*  21.5*  PLT 316  --    ------------------------------------------------------------------------------------------------------------------  Chemistries  Recent Labs  Lab 02/07/18 0430  NA 140  K 5.6*  CL 109  CO2 12*  GLUCOSE 136*  BUN 54*  CREATININE 3.83*  CALCIUM 8.5*  AST 30  ALT 14  ALKPHOS 38  BILITOT 0.5   ------------------------------------------------------------------------------------------------------------------  Cardiac Enzymes Recent Labs  Lab 02/07/18 0430  TROPONINI <0.03   ------------------------------------------------------------------------------------------------------------------  RADIOLOGY:  Ct Head Wo Contrast  Result Date: 02/07/2018 CLINICAL DATA:  Hematemesis for 1 week. On Xarelto. Altered level of consciousness. EXAM: CT HEAD WITHOUT CONTRAST TECHNIQUE: Contiguous axial images were obtained from the base of the skull through the vertex without intravenous contrast. COMPARISON:  None. FINDINGS: Brain: Mild cerebral atrophy. Ventricular dilatation consistent with central atrophy. Low-attenuation changes in the deep white matter consistent with small vessel ischemia. No mass effect or midline shift. No abnormal extra-axial fluid collections. Gray-white matter junctions are distinct. Basal cisterns are not effaced. No  acute intracranial hemorrhage. Vascular: Moderately severe intracranial arterial calcifications are present. Skull: Calvarium appears intact. No acute depressed skull fractures. Sinuses/Orbits: Paranasal sinuses and mastoid air cells are clear. Diffuse opacification of the visualized nasopharynx with low-attenuation material possibly representing fluid or edema. This is incompletely included within the field of view. Suggest CT neck with contrast for further evaluation to exclude neoplasm versus inflammatory etiology. Other: None. IMPRESSION: 1. No acute intracranial abnormalities. Mild chronic atrophy and small vessel ischemic changes. 2.  Opacification of the nasopharynx of nonspecific etiology, incompletely included within the field of view. Suggest CT neck with contrast for further evaluation. Electronically Signed   By: Lucienne Capers M.D.   On: 02/07/2018 06:13   Dg Chest Portable 1 View  Result Date: 02/07/2018 CLINICAL DATA:  Intubation. Hematemesis for 1 week. History of congestive heart failure and hypertension. EXAM: PORTABLE CHEST 1 VIEW COMPARISON:  07/15/2017 FINDINGS: Endotracheal tube tip measures 3.5 cm above the carina. Enteric tube tip is over the left upper quadrant consistent with location in the upper stomach. Shallow inspiration. Heart size and pulmonary vascularity are normal for technique. No airspace disease or consolidation in the lungs. No blunting of costophrenic angles. No pneumothorax. Mediastinal contours appear intact. IMPRESSION: Appliances appear in satisfactory location. No evidence of active pulmonary disease. Electronically Signed   By: Lucienne Capers M.D.   On: 02/07/2018 05:31   IMPRESSION AND PLAN:   A/P: 66Y PMHx AFib/AFlutter (Xarelto), p/w hematemesis, melena, encephalopathy, hypotension, hemorrhagic shock, acute blood loss anemia, intubated. Hyperkalemia, renal failure (AKI on CKD), anion gap metabolic acidosis, hypocalcemia, hypoalbuminemia, lactate elevation. -Hematemesis, melena, encephalopathy, hypotension, hemorrhagic shock, acute blood loss anemia: No known GI/liver Hx. Active bleed, Hgb 4.7. Massive txfn. TXA, Andexxa, KCentra, vit K, Protonix, Octreotide, Ceftriaxone. GI consult. Admit to ICU. High risk of mortality. -Hyperkalemia, renal failure, AKI on CKD: Baseline CKD III. Cr 3.83 on present admission (1.1-1.4 ~31mo ago). Pt likely w/ ATN 2/2 hypotension/hemorrhagic shock. Hx CHF. IVF. Monitor BMP, avoid nephrotoxins. Hold all meds. -Hypocalcemia: Ionized calcium. -Hypoalbuminemia: Prealbumin. -Lactate elevation, anion gap metabolic acidosis: AKI + lactate elevation, metabolic  acidosis. AGAP 19. Bicarb. -Hold all home meds. -FEN/GI: NPO. -DVT PPx: SCDs, no pharmacological DVT PPx (active bleed). -Code status: Full code. -Disposition: Admission, > 2 midnights. High risk of mortality.   All the records are reviewed and case discussed with ED provider. Management plans discussed with the patient, family and they are in agreement.  CODE STATUS: Full code.  TOTAL TIME TAKING CARE OF THIS PATIENT: 90 minutes.    Arta Silence M.D on 02/07/2018 at 9:08 AM  Between 7am to 6pm - Pager - [phone]  After 6pm go to [email or web address] - Proofreader  [facility]  Office  [phone]  CC: Primary care physician; Patient, [name]   Note: This dictation was prepared with Dragon dictation along with smaller phrase technology. Any transcriptional errors that result from this process are unintentional.

## 2018-02-07 NOTE — ED Notes (Signed)
Second unit of o neg infusing via rapid infuser, unit number [id number], exp date 02/13/18.

## 2018-02-07 NOTE — ED Notes (Signed)
One unit FFP begun.

## 2018-02-07 NOTE — Consult Note (Signed)
[facility]  CONSULT NOTE    Date: 02/07/2018                  Patient Name:  Nathan [name].  MRN: [id number]  DOB: 02/01/1951  Age / Sex: 67 y.o., male         PCP: Patient, [name]                 Service Requesting Consult: Dr. Soyla Murphy                 Reason for Consult: Acute renal failure with hyperkalemia            History of Present Illness: Nathan [name]. admitted to [facility] ICU for Hemorrhagic shock ([place]) [R57.8] Hyperkalemia [E87.5] Lactic acidosis [E87.2] Upper GI bleed [K92.2] Acute kidney injury ([place]) [N17.9] Hypothermia, initial encounter [T68.XXXA]   Required 6 units PRBC, 2 units FFP, 1 unit platelets, vitamin K, Kcentra and andexxa  Nephrology consulted for acute renal failure Medications: Outpatient medications: Medications Prior to Admission  Medication Sig Dispense Refill Last Dose  . furosemide (LASIX) 40 MG tablet Take 1 tablet (40 mg total) by mouth 2 (two) times daily. 180 tablet 2 unknown at unknown  . losartan (COZAAR) 100 MG tablet Take 100 mg by mouth daily.   unknown at unknown  . rivaroxaban (XARELTO) 20 MG TABS tablet Take 1 tablet (20 mg total) by mouth daily with supper. 30 tablet 6 unknoen at unknown  . atorvastatin (LIPITOR) 40 MG tablet Take 1 tablet (40 mg total) by mouth daily at 6 PM. 180 tablet 3 Taking  . metoprolol succinate (TOPROL-XL) 25 MG 24 hr tablet Take 25 mg by mouth 2 (two) times daily.   Taking  . potassium chloride SA (K-DUR,KLOR-CON) 20 MEQ tablet Take 20 mEq by mouth 2 (two) times daily.   Taking  . spironolactone (ALDACTONE) 25 MG tablet Take 25 mg by mouth daily.   Taking    Current medications: Current Facility-Administered Medications  Medication Dose Route Frequency Provider Last Rate Last Dose  . bisacodyl (DULCOLAX) EC tablet 5 mg  5 mg Oral Daily PRN Arta Silence, MD      . Derrill Memo ON 02/08/2018] cefTRIAXone (ROCEPHIN) 1 g in sodium chloride 0.9 % 100 mL IVPB  1 g  Intravenous Once Arta Silence, MD      . chlorhexidine gluconate (MEDLINE KIT) (PERIDEX) 0.12 % solution 15 mL  15 mL Mouth Rinse BID Cassandria Santee, MD   15 mL at 02/07/18 0851  . fentaNYL 10 mcg/ml infusion           . fentaNYL 2580mg in NS 252m(1037mml) infusion-PREMIX  0-400 mcg/hr Intravenous Continuous SamCassandria SanteeD 15 mL/hr at 02/07/18 0914 150 mcg/hr at 02/07/18 0914  . MEDLINE mouth rinse  15 mL Mouth Rinse 10 times per day SamCassandria SanteeD   15 mL at 02/07/18 1430  . norepinephrine (LEVOPHED) '4mg'$  in D5W 250m35memix infusion  0-40 mcg/min Intravenous Continuous RifeDarel Hong      . octreotide (SANDOSTATIN) 500 mcg in sodium chloride 0.9 % 250 mL (2 mcg/mL) infusion  50 mcg/hr Intravenous Continuous SridArta Silence 25 mL/hr at 02/07/18 1444 50 mcg/hr at 02/07/18 1444  . pantoprazole (PROTONIX) injection 40 mg  40 mg Intravenous Q12H SamaCassandria Santee   40 mg at 02/07/18 1209  . propofol (DIPRIVAN) 1000 MG/100ML infusion  5-80 mcg/kg/min Intravenous Continuous SamaCassandria Santee 13.07 mL/hr at 02/07/18  1113 20 mcg/kg/min at 02/07/18 1113  . senna-docusate (Senokot-S) tablet 1 tablet  1 tablet Oral QHS PRN Arta Silence, MD      . sodium bicarbonate 150 mEq in sterile water 1,000 mL infusion   Intravenous Continuous Cassandria Santee, MD 125 mL/hr at 02/07/18 1317    . sodium chloride 0.9 % 1,000 mL IV infusion  1,000 mL Intravenous Once Samaan, Maged, MD      . sodium chloride 0.9 % bolus 1,000 mL  1,000 mL Intravenous Once Cassandria Santee, MD 500 mL/hr at 02/07/18 1339 1,000 mL at 02/07/18 1339      Allergies: No Known Allergies    Past Medical History: Past Medical History:  Diagnosis Date  . Atrial flutter ([place])    a. s/p TEE/DCCV 04/2017; b. CHADS2VASc => 3 (CHF, HTN, age x 1); c. not compliant with Xarelto  . Chronic combined systolic and diastolic CHF (congestive heart failure) ([place])    a. TTE 2/19: EF 20-25%, diffuse HK, mod dilated LA, mildly  reduced RVSF, PASP 42; b. TTE 3/19: EF 20-25%, diffuse HK, RVSF nl, atrial flutter with RVR  . Essential hypertension   . Pulmonary hypertension ([place])      Past Surgical History: Past Surgical History:  Procedure Laterality Date  . A-FLUTTER ABLATION N/A 06/16/2017   Procedure: A-FLUTTER ABLATION;  Surgeon: [name], MD;  Location: [place] [facility];  Service: Cardiovascular;  Laterality: N/A;  . CARDIAC CATHETERIZATION    . CARDIOVERSION N/A 05/01/2017   Procedure: CARDIOVERSION;  Surgeon: [name], MD;  Location: [facility] ORS;  Service: Cardiovascular;  Laterality: N/A;  . CORONARY ANGIOPLASTY    . RIGHT/LEFT HEART CATH AND CORONARY ANGIOGRAPHY N/A 07/18/2017   Procedure: RIGHT/LEFT HEART CATH AND CORONARY ANGIOGRAPHY;  Surgeon: [name], MD;  Location: [place] [facility];  Service: Cardiovascular;  Laterality: N/A;  . TEE WITHOUT CARDIOVERSION N/A 05/01/2017   Procedure: TRANSESOPHAGEAL ECHOCARDIOGRAM (TEE);  Surgeon: [name], MD;  Location: [facility] ORS;  Service: Cardiovascular;  Laterality: N/A;  . TEE WITHOUT CARDIOVERSION N/A 06/16/2017   Procedure: TRANSESOPHAGEAL ECHOCARDIOGRAM (TEE);  Surgeon: [name], MD;  Location: [facility] ENDOSCOPY;  Service: Cardiovascular;  Laterality: N/A;     Family History: Family History  Problem Relation Age of Onset  . Alzheimer's disease Mother   . Alzheimer's disease Father      Social History: Social History   Socioeconomic History  . Marital status: Single    Spouse name: Not on file  . Number of children: 1  . Years of education: Not on file  . Highest education level: Not on file  Occupational History  . Occupation: [employer]    Comment: Theatre manager  Social Needs  . Financial resource strain: Not hard at all  . Food insecurity:    Worry: Never true    Inability: Never true  . Transportation needs:    Medical: Yes    Non-medical: Yes  Tobacco Use  . Smoking status: Former Smoker    Last attempt  to quit: 04/21/2017    Years since quitting: 0.8  . Smokeless tobacco: Never Used  Substance and Sexual Activity  . Alcohol use: Yes    Alcohol/week: 3.0 standard drinks    Types: 3 Cans of beer per week    Frequency: Never    Comment: Drink a half of a 40oz beer every day, drank heavily in the past  . Drug use: Never  . Sexual activity: Yes  Partners: Female  Lifestyle  . Physical activity:    Days per week: 7 days    Minutes per session: 20 min  . Stress: Not at all  Relationships  . Social connections:    Talks on phone: More than three times a week    Gets together: Once a week    Attends religious service: 1 to 4 times per year    Active member of club or organization: No    Attends meetings of clubs or organizations: Never    Relationship status: Divorced  . Intimate partner violence:    Fear of current or ex partner: No    Emotionally abused: No    Physically abused: No    Forced sexual activity: No  Other Topics Concern  . Not on file  Social History Narrative  . Not on file     Review of Systems: Review of Systems  Unable to perform ROS: Critical illness    Vital Signs: Blood pressure 101/71, pulse 63, temperature 97.9 F (36.6 C), resp. rate 16, height '6\' 2"'$  (1.88 m), weight 106.8 kg, SpO2 100 %.  Weight trends: Filed Weights   02/07/18 0414 02/07/18 0824  Weight: 108.9 kg 106.8 kg    Physical Exam: General: Critically ill  Head: ETT OGT  Eyes: Anicteric, PERRL  Neck: trachea midline  Lungs:  PRVC FiO2 40%  Heart: Regular rate and rhythm  Abdomen:  Soft, nontender, obese  Extremities:  no peripheral edema.  Neurologic: Intubated, sedated  Skin: No lesions  GU: Foley with yellow urine     Lab results: Basic Metabolic Panel: Recent Labs  Lab 02/07/18 0430 02/07/18 1124  NA 140 137  K 5.6* 6.0*  CL 109 106  CO2 12* 20*  GLUCOSE 136* 166*  BUN 54* 48*  CREATININE 3.83* 3.60*  CALCIUM 8.5* 8.4*  MG  --  2.3  PHOS  --  5.4*     Liver Function Tests: Recent Labs  Lab 02/07/18 0430  AST 30  ALT 14  ALKPHOS 38  BILITOT 0.5  PROT 6.6  ALBUMIN 3.3*   Recent Labs  Lab 02/07/18 0430  LIPASE 29   Recent Labs  Lab 02/07/18 0430  AMMONIA 15    CBC: Recent Labs  Lab 02/07/18 0430 02/07/18 0618 02/07/18 1124  WBC 7.2  --   --   NEUTROABS 5.5  --   --   HGB 4.7* 7.1* 9.1*  HCT 15.2* 21.5* 27.2*  MCV 95.0  --   --   PLT 316  --   --     Cardiac Enzymes: Recent Labs  Lab 02/07/18 0430  TROPONINI <0.03    BNP: Invalid input(s): POCBNP  CBG: Recent Labs  Lab 02/07/18 0822  GLUCAP 208*    Microbiology: Results for orders placed or performed during the hospital encounter of 02/07/18  MRSA PCR Screening     Status: None   Collection Time: 02/07/18  8:26 AM  Result Value Ref Range Status   MRSA by PCR NEGATIVE NEGATIVE Final    Comment:        The GeneXpert MRSA Assay (FDA approved for NASAL specimens only), is one component of a comprehensive MRSA colonization surveillance program. It is not intended to diagnose MRSA infection nor to guide or monitor treatment for MRSA infections. Performed at [facility], [address], [place], [place] [address]     Coagulation Studies: Recent Labs    02/07/18 0430 02/07/18 1124  LABPROT 24.9* 14.1  INR 2.29 1.10    Urinalysis: No results for input(s): COLORURINE, LABSPEC, PHURINE, GLUCOSEU, HGBUR, BILIRUBINUR, KETONESUR, PROTEINUR, UROBILINOGEN, NITRITE, LEUKOCYTESUR in the last 72 hours.  Invalid input(s): APPERANCEUR    Imaging: Dg Abd 1 View  Result Date: 02/07/2018 CLINICAL DATA:  NG tube placement EXAM: ABDOMEN - 1 VIEW COMPARISON:  02/07/2018 FINDINGS: Defibrillator pads overlie the lower chest and upper abdomen. NG tube extends within the proximal stomach region as before. Minor basilar atelectasis. Heart is mildly enlarged. Normal bowel gas pattern. Degenerative changes of the spine IMPRESSION: NG tube  extends into the proximal stomach. Normal bowel gas pattern. Electronically Signed   By: Jerilynn Mages.  Shick M.D.   On: 02/07/2018 10:58   Ct Head Wo Contrast  Result Date: 02/07/2018 CLINICAL DATA:  Hematemesis for 1 week. On Xarelto. Altered level of consciousness. EXAM: CT HEAD WITHOUT CONTRAST TECHNIQUE: Contiguous axial images were obtained from the base of the skull through the vertex without intravenous contrast. COMPARISON:  None. FINDINGS: Brain: Mild cerebral atrophy. Ventricular dilatation consistent with central atrophy. Low-attenuation changes in the deep white matter consistent with small vessel ischemia. No mass effect or midline shift. No abnormal extra-axial fluid collections. Gray-white matter junctions are distinct. Basal cisterns are not effaced. No acute intracranial hemorrhage. Vascular: Moderately severe intracranial arterial calcifications are present. Skull: Calvarium appears intact. No acute depressed skull fractures. Sinuses/Orbits: Paranasal sinuses and mastoid air cells are clear. Diffuse opacification of the visualized nasopharynx with low-attenuation material possibly representing fluid or edema. This is incompletely included within the field of view. Suggest CT neck with contrast for further evaluation to exclude neoplasm versus inflammatory etiology. Other: None. IMPRESSION: 1. No acute intracranial abnormalities. Mild chronic atrophy and small vessel ischemic changes. 2. Opacification of the nasopharynx of nonspecific etiology, incompletely included within the field of view. Suggest CT neck with contrast for further evaluation. Electronically Signed   By: Lucienne Capers M.D.   On: 02/07/2018 06:13   Dg Chest Portable 1 View  Result Date: 02/07/2018 CLINICAL DATA:  Intubation. Hematemesis for 1 week. History of congestive heart failure and hypertension. EXAM: PORTABLE CHEST 1 VIEW COMPARISON:  07/15/2017 FINDINGS: Endotracheal tube tip measures 3.5 cm above the carina. Enteric tube  tip is over the left upper quadrant consistent with location in the upper stomach. Shallow inspiration. Heart size and pulmonary vascularity are normal for technique. No airspace disease or consolidation in the lungs. No blunting of costophrenic angles. No pneumothorax. Mediastinal contours appear intact. IMPRESSION: Appliances appear in satisfactory location. No evidence of active pulmonary disease. Electronically Signed   By: Lucienne Capers M.D.   On: 02/07/2018 05:31      Assessment & Plan: Mr. Reyhan Moronta. is a 67 y.o. black male with atrial fibrillation on xarelto, congestive heart failure, hypertension, pulmonary hypertension, EtOH who was admitted to [facility] on 02/07/2018 for Hemorrhagic shock ([place]) [R57.8] Hyperkalemia [E87.5] Lactic acidosis [E87.2] Upper GI bleed [K92.2] Acute kidney injury ([place]) [N17.9] Hypothermia, initial encounter [T68.XXXA]  1. Acute renal failure with hyperkalemia and metabolic acidosis: on chronic kidney disease stage III. Baseline creatinine 1.2-1.4  Hyperkalemia secondary to potassium chloride, losartan, spironolactone, and acute renal failure Nonoliguric urine output - Continue bicarb gtt - No acute indication for dialysis.   2. Acute blood loss anemia with anemia of chronic kidney disease: status post transfusion protocol.  Off xarelto  3. Hypotension with congestive heart failure: off vasopressors.      LOS: 0 Nathan [name] 11/23/20193:00 PM

## 2018-02-07 NOTE — Progress Notes (Signed)
Patient ID: Giancarlo Askren., male   DOB: Aug 02, 1950, 67 y.o.   MRN: [id number]  [employer] PROGRESS NOTE  [name]. NID:[id number] DOB: January 16, 1951 DOA: 02/07/2018 PCP: Patient, [name]  HPI/Subjective: Intubated and sedated  Objective: Vitals:   02/07/18 1200 02/07/18 1300  BP: 109/72 101/71  Pulse: 64 63  Resp: 13 16  Temp:    SpO2: 100% 100%    Intake/Output Summary (Last 24 hours) at 02/07/2018 1322 Last data filed at 02/07/2018 1300 Gross per 24 hour  Intake 2889.47 ml  Output 105 ml  Net 2784.47 ml   Filed Weights   02/07/18 0414 02/07/18 0824  Weight: 108.9 kg 106.8 kg    ROS: Review of Systems  Unable to perform ROS: Intubated   Exam: Physical Exam  HENT:  Nose: No mucosal edema.  Mouth/Throat: No oropharyngeal exudate or posterior oropharyngeal edema.  Eyes: Pupils are equal, round, and reactive to light. Conjunctivae, EOM and lids are normal.  Neck: No JVD present. Carotid bruit is not present. No edema present. No thyroid mass and no thyromegaly present.  Cardiovascular: S1 normal and S2 normal. Exam reveals no gallop.  No murmur heard. Pulses:      Dorsalis pedis pulses are 2+ on the right side, and 2+ on the left side.  Respiratory: No respiratory distress. He has no wheezes. He has no rhonchi. He has no rales.  GI: Soft. Bowel sounds are normal. There is no tenderness.  Musculoskeletal:       Right ankle: He exhibits no swelling.       Left ankle: He exhibits no swelling.  Lymphadenopathy:    He has no cervical adenopathy.  Neurological:  Intubated and sedated  Skin: Skin is warm. No rash noted. Nails show no clubbing.  Psychiatric:  Intubated and sedated      Data Reviewed: Basic Metabolic Panel: Recent Labs  Lab 02/07/18 0430 02/07/18 1124  NA 140 137  K 5.6* 6.0*  CL 109 106  CO2 12* 20*  GLUCOSE 136* 166*  BUN 54* 48*  CREATININE 3.83* 3.60*  CALCIUM 8.5* 8.4*  MG  --  2.3  PHOS  --  5.4*   Liver Function  Tests: Recent Labs  Lab 02/07/18 0430  AST 30  ALT 14  ALKPHOS 38  BILITOT 0.5  PROT 6.6  ALBUMIN 3.3*   Recent Labs  Lab 02/07/18 0430  LIPASE 29   Recent Labs  Lab 02/07/18 0430  AMMONIA 15   CBC: Recent Labs  Lab 02/07/18 0430 02/07/18 0618 02/07/18 1124  WBC 7.2  --   --   NEUTROABS 5.5  --   --   HGB 4.7* 7.1* 9.1*  HCT 15.2* 21.5* 27.2*  MCV 95.0  --   --   PLT 316  --   --    Cardiac Enzymes: Recent Labs  Lab 02/07/18 0430  TROPONINI <0.03   BNP (last 3 results) Recent Labs    04/29/17 2023 06/12/17 1839 07/15/17 1456  BNP 1,558.0* 1,839.0* 4,283.0*     CBG: Recent Labs  Lab 02/07/18 0822  GLUCAP 208*    Recent Results (from the past 240 hour(s))  MRSA PCR Screening     Status: None   Collection Time: 02/07/18  8:26 AM  Result Value Ref Range Status   MRSA by PCR NEGATIVE NEGATIVE Final    Comment:        The GeneXpert MRSA Assay (FDA approved for NASAL specimens only), is one component  of a comprehensive MRSA colonization surveillance program. It is not intended to diagnose MRSA infection nor to guide or monitor treatment for MRSA infections. Performed at [facility], [address] [place], [place] [address]      Studies: Dg Abd 1 View  Result Date: 02/07/2018 CLINICAL DATA:  NG tube placement EXAM: ABDOMEN - 1 VIEW COMPARISON:  02/07/2018 FINDINGS: Defibrillator pads overlie the lower chest and upper abdomen. NG tube extends within the proximal stomach region as before. Minor basilar atelectasis. Heart is mildly enlarged. Normal bowel gas pattern. Degenerative changes of the spine IMPRESSION: NG tube extends into the proximal stomach. Normal bowel gas pattern. Electronically Signed   By: Jerilynn Mages.  Shick M.D.   On: 02/07/2018 10:58   Ct Head Wo Contrast  Result Date: 02/07/2018 CLINICAL DATA:  Hematemesis for 1 week. On Xarelto. Altered level of consciousness. EXAM: CT HEAD WITHOUT CONTRAST TECHNIQUE: Contiguous axial  images were obtained from the base of the skull through the vertex without intravenous contrast. COMPARISON:  None. FINDINGS: Brain: Mild cerebral atrophy. Ventricular dilatation consistent with central atrophy. Low-attenuation changes in the deep white matter consistent with small vessel ischemia. No mass effect or midline shift. No abnormal extra-axial fluid collections. Gray-white matter junctions are distinct. Basal cisterns are not effaced. No acute intracranial hemorrhage. Vascular: Moderately severe intracranial arterial calcifications are present. Skull: Calvarium appears intact. No acute depressed skull fractures. Sinuses/Orbits: Paranasal sinuses and mastoid air cells are clear. Diffuse opacification of the visualized nasopharynx with low-attenuation material possibly representing fluid or edema. This is incompletely included within the field of view. Suggest CT neck with contrast for further evaluation to exclude neoplasm versus inflammatory etiology. Other: None. IMPRESSION: 1. No acute intracranial abnormalities. Mild chronic atrophy and small vessel ischemic changes. 2. Opacification of the nasopharynx of nonspecific etiology, incompletely included within the field of view. Suggest CT neck with contrast for further evaluation. Electronically Signed   By: Lucienne Capers M.D.   On: 02/07/2018 06:13   Dg Chest Portable 1 View  Result Date: 02/07/2018 CLINICAL DATA:  Intubation. Hematemesis for 1 week. History of congestive heart failure and hypertension. EXAM: PORTABLE CHEST 1 VIEW COMPARISON:  07/15/2017 FINDINGS: Endotracheal tube tip measures 3.5 cm above the carina. Enteric tube tip is over the left upper quadrant consistent with location in the upper stomach. Shallow inspiration. Heart size and pulmonary vascularity are normal for technique. No airspace disease or consolidation in the lungs. No blunting of costophrenic angles. No pneumothorax. Mediastinal contours appear intact. IMPRESSION:  Appliances appear in satisfactory location. No evidence of active pulmonary disease. Electronically Signed   By: Lucienne Capers M.D.   On: 02/07/2018 05:31    Scheduled Meds: . chlorhexidine gluconate (MEDLINE KIT)  15 mL Mouth Rinse BID  . mouth rinse  15 mL Mouth Rinse 10 times per day  . pantoprazole  40 mg Intravenous Q12H   Continuous Infusions: . [START ON 02/08/2018] cefTRIAXone (ROCEPHIN)  IV    . fentaNYL    . fentaNYL infusion INTRAVENOUS 150 mcg/hr (02/07/18 0914)  . norepinephrine (LEVOPHED) Adult infusion    . octreotide  (SANDOSTATIN)    IV infusion 50 mcg/hr (02/07/18 0600)  . propofol (DIPRIVAN) infusion 20 mcg/kg/min (02/07/18 1113)  .  sodium bicarbonate (isotonic) infusion in sterile water 125 mL/hr at 02/07/18 1317  . sodium chloride 0.9% NICU IV bolus    . sodium chloride 1,000 mL (02/07/18 1224)  . sodium chloride      Assessment/Plan:  1. Acute hemorrhagic  shock with GI bleed likely upper source.  Empirically on Protonix drip and octreotide drip.  After multiple transfusions hemoglobin is now up to 9.1.  Hemoglobin was 4.7 when he came in.  Xarelto on hold.  Patient received IV vitamin K, IV trans-examic acid, and Kcentra.  Serial hemoglobins.  GI consultation.  Pressors if needed. 2. Acute hypoxic respiratory failure.  Intubated for airway protection.  Sedation with fentanyl drip 3. Acute kidney injury.  Monitor with IV fluids.  On sodium bicarb drip 4. Hyperkalemia.  Monitor with IV fluids. 5. History of atrial fibrillation.  Anticoagulation on hold with GI bleed 6. Chronic systolic congestive heart failure.  Monitor closely. 7.   Code Status:     Code Status Orders  (From admission, onward)         Start     Ordered   02/07/18 0606  Full code  Continuous     02/07/18 0605        Code Status History    Date Active Date Inactive Code Status Order ID Comments User Context   07/15/2017 1955 07/19/2017 1908 Full Code [id number]  Gorden Harms, MD  Inpatient   06/16/2017 1915 06/17/2017 1850 Full Code [id number]  [name], MD Inpatient   06/15/2017 1702 06/16/2017 1915 Full Code [id number]  Daune Perch, NP Inpatient   06/12/2017 2142 06/15/2017 1523 Full Code [id number]  Demetrios Loll, MD Inpatient   04/29/2017 2333 05/01/2017 1920 Full Code [id number]  Lance Coon, MD Inpatient     Family Communication: As per critical care specialist Disposition Plan: To be determined  Consultants:  Critical care specialist  Gastroenterology  Antibiotics:  Rocephin  Time spent: 35 minutes.  Patient is critically ill and needs ICU monitoring  [place]

## 2018-02-07 NOTE — ED Notes (Signed)
Stop ns bolus per md

## 2018-02-07 NOTE — ED Notes (Signed)
Attempt to call report to ccu.

## 2018-02-07 NOTE — Progress Notes (Signed)
MEDICATION RELATED CONSULT NOTE - INITIAL   Pharmacy Consult for [facility] Indication: hemorrhagic shock possibly d/t xarelto   No Known Allergies  Patient Measurements: Height: 6\' 5"  (195.6 cm) Weight: 240 lb (108.9 kg) IBW/kg (Calculated) : 89.1  Vital Signs: Temp: 97.9 F (36.6 C) (11/23 0713) Temp Source: Rectal (11/23 0416) BP: 105/72 (11/23 0713) Pulse Rate: 95 (11/23 0713) Intake/Output from previous day: 11/22 0701 - 11/23 0700 In: 1119.4 [I.V.:500; IV Piggyback:619.4] Out: -  Intake/Output from this shift: Total I/O In: 200 [IV Piggyback:200] Out: -   Labs: Recent Labs    02/07/18 0430 02/07/18 0618  WBC 7.2  --   HGB 4.7* 7.1*  HCT 15.2* 21.5*  PLT 316  --   APTT  --  54*  CREATININE 3.83*  --   ALBUMIN 3.3*  --   PROT 6.6  --   AST 30  --   ALT 14  --   ALKPHOS 38  --   BILITOT 0.5  --    Estimated Creatinine Clearance: 26 mL/min (A) (by C-G formula based on SCr of 3.83 mg/dL (H)).   Microbiology: No results found for this or any previous visit (from the past 720 hour(s)).  Medical History: Past Medical History:  Diagnosis Date  . Atrial flutter ([place])    a. s/p TEE/DCCV 04/2017; b. CHADS2VASc => 3 (CHF, HTN, age x 1); c. not compliant with Xarelto  . Chronic combined systolic and diastolic CHF (congestive heart failure) ([place])    a. TTE 2/19: EF 20-25%, diffuse HK, mod dilated LA, mildly reduced RVSF, PASP 42; b. TTE 3/19: EF 20-25%, diffuse HK, RVSF nl, atrial flutter with RVR  . Essential hypertension   . Pulmonary hypertension ([facility])     Medications:  Scheduled:    Assessment: Patient admitted w/ hematemesis for about a week. Patient obtunded w/ h/o afib on xarelto 20 mg daily. Hgb 4.7 >> 7.1 Hct 15.2 >> 21.5 Patient given IV vitamin K 10 mg, andexxa 800 mg IV x 1 followed by 8mg /min x 120 minutes  Goal of Therapy:  Reversal of GI bleed and stabilization for CBC.  Plan:  Patient given Kcentra 2000 units IV x 1 (20 units/kg). MD  informed of Andexxa ordered--MD was okay with giving both. Baseline aPTT elevated @ 54 seconds. Will continue to monitor CBC and will check anti-Xa w/ am labs to assess DOAC reversal.  Tobie Lords, PharmD, BCPS Clinical Pharmacist 02/07/2018

## 2018-02-07 NOTE — ED Notes (Signed)
Third unit of o neg initiaed per dr. Mable Paris for MTp unit number [id number]

## 2018-02-07 NOTE — ED Triage Notes (Signed)
Pt with hematemesis x1 week per ems. Per ems pt is on xarelto. Pt pale. Pt denies pain.

## 2018-02-07 NOTE — ED Notes (Signed)
Insulin verified with butch, rn.

## 2018-02-07 NOTE — ED Notes (Addendum)
Nathan [name], CCU aware that only 1 unit of platelets given in ER and another in blood bank.   RT at bedside. transferring patient

## 2018-02-07 NOTE — ED Notes (Signed)
Report to ally, rn.  

## 2018-02-08 LAB — MAGNESIUM
MAGNESIUM: 1.9 mg/dL (ref 1.7–2.4)
MAGNESIUM: 1.9 mg/dL (ref 1.7–2.4)
MAGNESIUM: 2.1 mg/dL (ref 1.7–2.4)
Magnesium: 2 mg/dL (ref 1.7–2.4)
Magnesium: 2.2 mg/dL (ref 1.7–2.4)

## 2018-02-08 LAB — PREPARE FRESH FROZEN PLASMA: UNIT DIVISION: 0

## 2018-02-08 LAB — CBC WITH DIFFERENTIAL/PLATELET
Abs Immature Granulocytes: 0.04 10*3/uL (ref 0.00–0.07)
BASOS ABS: 0 10*3/uL (ref 0.0–0.1)
BASOS PCT: 1 %
Eosinophils Absolute: 0.2 10*3/uL (ref 0.0–0.5)
Eosinophils Relative: 3 %
HCT: 26.4 % — ABNORMAL LOW (ref 39.0–52.0)
Hemoglobin: 8.8 g/dL — ABNORMAL LOW (ref 13.0–17.0)
IMMATURE GRANULOCYTES: 1 %
Lymphocytes Relative: 12 %
Lymphs Abs: 0.8 10*3/uL (ref 0.7–4.0)
MCH: 29.7 pg (ref 26.0–34.0)
MCHC: 33.3 g/dL (ref 30.0–36.0)
MCV: 89.2 fL (ref 80.0–100.0)
Monocytes Absolute: 1 10*3/uL (ref 0.1–1.0)
Monocytes Relative: 14 %
NEUTROS PCT: 69 %
NRBC: 0.3 % — AB (ref 0.0–0.2)
Neutro Abs: 4.9 10*3/uL (ref 1.7–7.7)
PLATELETS: 212 10*3/uL (ref 150–400)
RBC: 2.96 MIL/uL — AB (ref 4.22–5.81)
RDW: 15 % (ref 11.5–15.5)
WBC: 7 10*3/uL (ref 4.0–10.5)

## 2018-02-08 LAB — COMPREHENSIVE METABOLIC PANEL
ALBUMIN: 2.8 g/dL — AB (ref 3.5–5.0)
ALT: 11 U/L (ref 0–44)
AST: 13 U/L — ABNORMAL LOW (ref 15–41)
Alkaline Phosphatase: 38 U/L (ref 38–126)
Anion gap: 12 (ref 5–15)
BUN: 40 mg/dL — ABNORMAL HIGH (ref 8–23)
CHLORIDE: 99 mmol/L (ref 98–111)
CO2: 30 mmol/L (ref 22–32)
CREATININE: 2.48 mg/dL — AB (ref 0.61–1.24)
Calcium: 7.3 mg/dL — ABNORMAL LOW (ref 8.9–10.3)
GFR calc non Af Amer: 25 mL/min — ABNORMAL LOW (ref >60)
GFR, EST AFRICAN AMERICAN: 30 mL/min — AB (ref >60)
Glucose, Bld: 121 mg/dL — ABNORMAL HIGH (ref 70–99)
Potassium: 3.9 mmol/L (ref 3.5–5.1)
SODIUM: 141 mmol/L (ref 135–145)
Total Bilirubin: 0.7 mg/dL (ref 0.3–1.2)
Total Protein: 5.3 g/dL — ABNORMAL LOW (ref 6.5–8.1)

## 2018-02-08 LAB — BPAM PLATELET PHERESIS
Blood Product Expiration Date: 201911242359
Blood Product Expiration Date: 201911242359
ISSUE DATE / TIME: 201911230730
ISSUE DATE / TIME: 201911230939
UNIT TYPE AND RH: 600
Unit Type and Rh: 6200

## 2018-02-08 LAB — BPAM FFP
BLOOD PRODUCT EXPIRATION DATE: 201911282359
Blood Product Expiration Date: 201911282359
ISSUE DATE / TIME: 201911230626
ISSUE DATE / TIME: 201911230939
UNIT TYPE AND RH: 5100
UNIT TYPE AND RH: 5100

## 2018-02-08 LAB — PREPARE PLATELET PHERESIS
UNIT DIVISION: 0
UNIT DIVISION: 0

## 2018-02-08 LAB — PHOSPHORUS
PHOSPHORUS: 3.1 mg/dL (ref 2.5–4.6)
PHOSPHORUS: 2.9 mg/dL (ref 2.5–4.6)
Phosphorus: 3.2 mg/dL (ref 2.5–4.6)
Phosphorus: 3.2 mg/dL (ref 2.5–4.6)
Phosphorus: 3.7 mg/dL (ref 2.5–4.6)

## 2018-02-08 LAB — HEMOGLOBIN AND HEMATOCRIT, BLOOD
HCT: 23.7 % — ABNORMAL LOW (ref 39.0–52.0)
HCT: 30.4 % — ABNORMAL LOW (ref 39.0–52.0)
HCT: 30.7 % — ABNORMAL LOW (ref 39.0–52.0)
HEMATOCRIT: 30.6 % — AB (ref 39.0–52.0)
HEMOGLOBIN: 10.1 g/dL — AB (ref 13.0–17.0)
HEMOGLOBIN: 10.3 g/dL — AB (ref 13.0–17.0)
Hemoglobin: 10.1 g/dL — ABNORMAL LOW (ref 13.0–17.0)
Hemoglobin: 8.2 g/dL — ABNORMAL LOW (ref 13.0–17.0)

## 2018-02-08 LAB — PROCALCITONIN: Procalcitonin: 0.11 ng/mL

## 2018-02-08 LAB — PROTIME-INR
INR: 1.22
Prothrombin Time: 15.3 seconds — ABNORMAL HIGH (ref 11.4–15.2)

## 2018-02-08 LAB — HEPARIN LEVEL (UNFRACTIONATED): Heparin Unfractionated: 1.31 IU/mL — ABNORMAL HIGH (ref 0.30–0.70)

## 2018-02-08 NOTE — Progress Notes (Signed)
RT called to room due to patient self extubating. Patient doing well, was placed on [place] by RN at 2l with sats of 100%.

## 2018-02-08 NOTE — Progress Notes (Signed)
[facility]  ROUNDING NOTE   Subjective:   UOP 1405  Creatinine 2.48 (3.22)  Remains intubated and sedated  Sodium bicarbonate at 133m/hr  Objective:  Vital signs in last 24 hours:  Temp:  [97.7 F (36.5 C)-98.8 F (37.1 C)] 98.8 F (37.1 C) (11/24 0900) Pulse Rate:  [52-69] 56 (11/24 0900) Resp:  [13-17] 16 (11/24 0900) BP: (100-139)/(61-81) 137/75 (11/24 0900) SpO2:  [100 %] 100 % (11/24 0900) FiO2 (%):  [30 %-50 %] 30 % (11/24 0857) Weight:  [[762kg] 113 kg (11/24 0500)  Weight change: -2.063 kg Filed Weights   02/07/18 0414 02/07/18 0824 02/08/18 0500  Weight: 108.9 kg 106.8 kg 113 kg    Intake/Output: I/O last 3 completed shifts: In: 6050.3 [I.V.:4179.4; IV Piggyback:1870.9] Out: 18315[Urine:1405]   Intake/Output this shift:  Total I/O In: 356.1 [I.V.:356.1] Out: -   Physical Exam: General: Critically ill   Head: ETT   Eyes: Anicteric, PERRL  Neck:   trachea midline  Lungs:  Clear to auscultation, PRVC 40%  Heart: Bradycardia   Abdomen:  Soft, +bowel sounds  Extremities: no peripheral edema.  Neurologic: Intubated, sedated  Skin: No lesions  GU: Foley with yellow urine    Basic Metabolic Panel: Recent Labs  Lab 02/07/18 0430 02/07/18 1124 02/07/18 1721 02/07/18 2336 02/08/18 0333  NA 140 137 137  --  141  K 5.6* 6.0* 5.4*  --  3.9  CL 109 106 107  --  99  CO2 12* 20* 21*  --  30  GLUCOSE 136* 166* 117*  --  121*  BUN 54* 48* 48*  --  40*  CREATININE 3.83* 3.60* 3.22*  --  2.48*  CALCIUM 8.5* 8.4* 7.8*  --  7.3*  MG  --  2.3 2.2 1.9 1.9  PHOS  --  5.4* 5.0* 3.2 3.1    Liver Function Tests: Recent Labs  Lab 02/07/18 0430 02/08/18 0333  AST 30 13*  ALT 14 11  ALKPHOS 38 38  BILITOT 0.5 0.7  PROT 6.6 5.3*  ALBUMIN 3.3* 2.8*   Recent Labs  Lab 02/07/18 0430  LIPASE 29   Recent Labs  Lab 02/07/18 0430  AMMONIA 15    CBC: Recent Labs  Lab 02/07/18 0430 02/07/18 0618 02/07/18 1124 02/07/18 1721  02/07/18 2336 02/08/18 0333  WBC 7.2  --   --   --   --  7.0  NEUTROABS 5.5  --   --   --   --  4.9  HGB 4.7* 7.1* 9.1* 9.7* 8.2* 8.8*  HCT 15.2* 21.5* 27.2* 28.9* 23.7* 26.4*  MCV 95.0  --   --   --   --  89.2  PLT 316  --   --   --   --  212    Cardiac Enzymes: Recent Labs  Lab 02/07/18 0430  TROPONINI <0.03    BNP: Invalid input(s): POCBNP  CBG: Recent Labs  Lab 02/07/18 0822  GLUCAP 208*    Microbiology: Results for orders placed or performed during the hospital encounter of 02/07/18  MRSA PCR Screening     Status: None   Collection Time: 02/07/18  8:26 AM  Result Value Ref Range Status   MRSA by PCR NEGATIVE NEGATIVE Final    Comment:        The GeneXpert MRSA Assay (FDA approved for NASAL specimens only), is one component of a comprehensive MRSA colonization surveillance program. It is not intended to diagnose MRSA infection nor to  guide or monitor treatment for MRSA infections. Performed at [facility], [address] [place], [place] [address]     Coagulation Studies: Recent Labs    02/07/18 0430 02/07/18 1124 02/07/18 1931 02/08/18 0333  LABPROT 24.9* 14.1 16.7* 15.3*  INR 2.29 1.10 1.37 1.22    Urinalysis: No results for input(s): COLORURINE, LABSPEC, PHURINE, GLUCOSEU, HGBUR, BILIRUBINUR, KETONESUR, PROTEINUR, UROBILINOGEN, NITRITE, LEUKOCYTESUR in the last 72 hours.  Invalid input(s): APPERANCEUR    Imaging: Dg Abd 1 View  Result Date: 02/07/2018 CLINICAL DATA:  NG tube placement EXAM: ABDOMEN - 1 VIEW COMPARISON:  02/07/2018 FINDINGS: Defibrillator pads overlie the lower chest and upper abdomen. NG tube extends within the proximal stomach region as before. Minor basilar atelectasis. Heart is mildly enlarged. Normal bowel gas pattern. Degenerative changes of the spine IMPRESSION: NG tube extends into the proximal stomach. Normal bowel gas pattern. Electronically Signed   By: Jerilynn Mages.  Shick M.D.   On: 02/07/2018 10:58   Ct Head  Wo Contrast  Result Date: 02/07/2018 CLINICAL DATA:  Hematemesis for 1 week. On Xarelto. Altered level of consciousness. EXAM: CT HEAD WITHOUT CONTRAST TECHNIQUE: Contiguous axial images were obtained from the base of the skull through the vertex without intravenous contrast. COMPARISON:  None. FINDINGS: Brain: Mild cerebral atrophy. Ventricular dilatation consistent with central atrophy. Low-attenuation changes in the deep white matter consistent with small vessel ischemia. No mass effect or midline shift. No abnormal extra-axial fluid collections. Gray-white matter junctions are distinct. Basal cisterns are not effaced. No acute intracranial hemorrhage. Vascular: Moderately severe intracranial arterial calcifications are present. Skull: Calvarium appears intact. No acute depressed skull fractures. Sinuses/Orbits: Paranasal sinuses and mastoid air cells are clear. Diffuse opacification of the visualized nasopharynx with low-attenuation material possibly representing fluid or edema. This is incompletely included within the field of view. Suggest CT neck with contrast for further evaluation to exclude neoplasm versus inflammatory etiology. Other: None. IMPRESSION: 1. No acute intracranial abnormalities. Mild chronic atrophy and small vessel ischemic changes. 2. Opacification of the nasopharynx of nonspecific etiology, incompletely included within the field of view. Suggest CT neck with contrast for further evaluation. Electronically Signed   By: Lucienne Capers M.D.   On: 02/07/2018 06:13   Dg Chest Portable 1 View  Result Date: 02/07/2018 CLINICAL DATA:  Intubation. Hematemesis for 1 week. History of congestive heart failure and hypertension. EXAM: PORTABLE CHEST 1 VIEW COMPARISON:  07/15/2017 FINDINGS: Endotracheal tube tip measures 3.5 cm above the carina. Enteric tube tip is over the left upper quadrant consistent with location in the upper stomach. Shallow inspiration. Heart size and pulmonary  vascularity are normal for technique. No airspace disease or consolidation in the lungs. No blunting of costophrenic angles. No pneumothorax. Mediastinal contours appear intact. IMPRESSION: Appliances appear in satisfactory location. No evidence of active pulmonary disease. Electronically Signed   By: Lucienne Capers M.D.   On: 02/07/2018 05:31     Medications:   . fentaNYL infusion INTRAVENOUS 150 mcg/hr (02/08/18 0900)  . norepinephrine (LEVOPHED) Adult infusion    . octreotide  (SANDOSTATIN)    IV infusion 50 mcg/hr (02/08/18 0900)  . propofol (DIPRIVAN) infusion 20 mcg/kg/min (02/08/18 0900)  .  sodium bicarbonate (isotonic) infusion in sterile water 125 mL/hr at 02/08/18 0900  . sodium chloride 0.9% NICU IV bolus     . chlorhexidine gluconate (MEDLINE KIT)  15 mL Mouth Rinse BID  . mouth rinse  15 mL Mouth Rinse 10 times per day  . pantoprazole  40 mg  Intravenous Q12H   bisacodyl, senna-docusate  Assessment/ Plan:  [name]. is a 67 y.o. black male with atrial fibrillation on xarelto, congestive heart failure, hypertension, pulmonary hypertension, EtOH who was admitted to [facility] on 02/07/2018   1. Acute renal failure with hyperkalemia and metabolic acidosis: on chronic kidney disease stage III. Baseline creatinine 1.2-1.4  Hyperkalemia secondary to potassium chloride, losartan, spironolactone, and acute renal failure Nonoliguric urine output  - Continue bicarb gtt - No acute indication for dialysis.   2. Acute blood loss anemia with anemia of chronic kidney disease: status post transfusion protocol. Required 6 units PRBC, 2 units FFP, 1 unit platelets, vitamin K, Kcentra and andexxa Off xarelto - Appreciate GI input.   3. Hypotension with congestive heart failure: off vasopressors.    LOS: 1 Nathan [name] 11/24/20199:30 AM

## 2018-02-08 NOTE — Progress Notes (Signed)
Name: Nathan [name]. MRN: [id number] DOB: 04/30/1950     CONSULTATION DATE: 02/07/2018  Subjective & objectives: Fentanyl drip + octreotide + bicarb drip  PAST MEDICAL HISTORY :   has a past medical history of Atrial flutter ([place]), Chronic combined systolic and diastolic CHF (congestive heart failure) ([place]), Essential hypertension, and Pulmonary hypertension ([place]).  has a past surgical history that includes TEE without cardioversion (N/A, 05/01/2017); CARDIOVERSION (N/A, 05/01/2017); TEE without cardioversion (N/A, 06/16/2017); A-FLUTTER ABLATION (N/A, 06/16/2017); RIGHT/LEFT HEART CATH AND CORONARY ANGIOGRAPHY (N/A, 07/18/2017); Cardiac catheterization; and Coronary angioplasty. Prior to Admission medications   Medication Sig Start Date End Date Taking? Authorizing Provider  furosemide (LASIX) 40 MG tablet Take 1 tablet (40 mg total) by mouth 2 (two) times daily. 12/31/17  Yes [name], MD  losartan (COZAAR) 100 MG tablet Take 100 mg by mouth daily.   Yes [provider]  rivaroxaban (XARELTO) 20 MG TABS tablet Take 1 tablet (20 mg total) by mouth daily with supper. 07/22/17  Yes [name], MD  atorvastatin (LIPITOR) 40 MG tablet Take 1 tablet (40 mg total) by mouth daily at 6 PM. 07/22/17   Gollan, Kathlene November, MD  metoprolol succinate (TOPROL-XL) 25 MG 24 hr tablet Take 25 mg by mouth 2 (two) times daily.    [provider]  potassium chloride SA (K-DUR,KLOR-CON) 20 MEQ tablet Take 20 mEq by mouth 2 (two) times daily.    [provider]  spironolactone (ALDACTONE) 25 MG tablet Take 25 mg by mouth daily.    [provider]   No Known Allergies  FAMILY HISTORY:  family history includes Alzheimer's disease in his father and mother. SOCIAL HISTORY:  reports that he quit smoking about 9 months ago. He has never used smokeless tobacco. He reports that he drinks about 3.0 standard drinks of alcohol per week. He reports that he does not use  drugs.  REVIEW OF SYSTEMS:   Unable to obtain due to critical illness   VITAL SIGNS: Temp:  [97.7 F (36.5 C)-98.8 F (37.1 C)] 98.8 F (37.1 C) (11/24 0900) Pulse Rate:  [52-65] 56 (11/24 0900) Resp:  [13-17] 16 (11/24 0900) BP: (100-139)/(61-81) 137/75 (11/24 0900) SpO2:  [100 %] 100 % (11/24 0900) FiO2 (%):  [30 %-50 %] 30 % (11/24 0857) Weight:  [427 kg] 113 kg (11/24 0500)  Physical Examination:  RASS -1, following commands and no focal neuro deficits On the vent, no distress, bilateral equal air entry with no adventitious sounds S1 & S2 are audible with no murmur Benign abdominal exam with feeble peristalsis Wasted extremities and no leg edema   ASSESSMENT / PLAN: Acute respiratory failure intubated for airway protection with altered mental status. Self extubated on SBT, doing well -Monitor work of breathing and O2 sat. Consider reintubation / BiPAP if needed  Altered mental status (improved) with toxic metabolic encephalopathy and cerebral hypoperfusion.  CT head did not show acute intracranial abnormalities -Monitor neuro status  Acute blood loss anemia with hemorrhagic shock. -Liberal policy for massive blood transfusion to keep hemoglobin more than 9 g/dL -Monitor hemodynamic consider vasopressors to maintain systolic blood pressure more than 100.  GI bleeding. -PPI + octreotide -Awaiting endoscopy and management as per GI  AKI (improved), hyperkalemia and intravascular volume depletion on top of CKD -Optimize hydration, temporizing agent for hyperkalemia, avoid nephrotoxins, monitor renal panel and urine output -Consider nephrology consult  Metabolic acidosis with lactic acidemia dure to tissue hypoperfusion (improved). procalcitonin 0.11 Sepsis is  less likey. -Optimize bicarbonate based solution, hydration. -D/C Rocephin  -monitor lactic acid, procalcitonin and cultures  HFrEF 35 to 40%, A. fib status post DCCV. -Rate control -Watch for volume  overload  Coagulopathy with Xarelto for A. Fib.  S/P reversal with Andexxa + Kcentra + vitamin K because of GI bleeding -Monitor coags  Full code  DVT and GI prophylaxis.  Continue supportive care  Critical care time 50 minutes

## 2018-02-08 NOTE — Progress Notes (Signed)
[place] for [facility] Indication: hemorrhagic shock possibly d/t xarelto   No Known Allergies  Patient Measurements: Height: '6\' 2"'$  (188 cm) Weight: 249 lb 1.9 oz (113 kg) IBW/kg (Calculated) : 82.2  Vital Signs: Temp: 98.8 F (37.1 C) (11/24 0900) Temp Source: Bladder (11/24 0400) BP: 137/75 (11/24 0900) Pulse Rate: 56 (11/24 0900) Intake/Output from previous day: 11/23 0701 - 11/24 0700 In: 4931 [I.V.:3679.4; IV Piggyback:1251.6] Out: 1405 [Urine:1405] Intake/Output from this shift: Total I/O In: 343 [I.V.:343] Out: -   Labs: Recent Labs    02/07/18 0430 02/07/18 0618  02/07/18 1124 02/07/18 1721 02/07/18 2336 02/08/18 0333 02/08/18 1035  WBC 7.2  --   --   --   --   --  7.0  --   HGB 4.7* 7.1*  --  9.1* 9.7* 8.2* 8.8* 10.1*  HCT 15.2* 21.5*  --  27.2* 28.9* 23.7* 26.4* 30.6*  PLT 316  --   --   --   --   --  212  --   APTT  --  54*  --   --   --   --   --   --   CREATININE 3.83*  --   --  3.60* 3.22*  --  2.48*  --   MG  --   --    < > 2.3 2.2 1.9 1.9 2.0  PHOS  --   --    < > 5.4* 5.0* 3.2 3.1 3.2  ALBUMIN 3.3*  --   --   --   --   --  2.8*  --   PROT 6.6  --   --   --   --   --  5.3*  --   AST 30  --   --   --   --   --  13*  --   ALT 14  --   --   --   --   --  11  --   ALKPHOS 38  --   --   --   --   --  38  --   BILITOT 0.5  --   --   --   --   --  0.7  --    < > = values in this interval not displayed.   Estimated Creatinine Clearance: 39.2 mL/min (A) (by C-G formula based on SCr of 2.48 mg/dL (H)).   Microbiology: Recent Results (from the past 720 hour(s))  MRSA PCR Screening     Status: None   Collection Time: 02/07/18  8:26 AM  Result Value Ref Range Status   MRSA by PCR NEGATIVE NEGATIVE Final    Comment:        The GeneXpert MRSA Assay (FDA approved for NASAL specimens only), is one component of a comprehensive MRSA colonization surveillance program. It is not intended to diagnose MRSA infection  nor to guide or monitor treatment for MRSA infections. Performed at [facility], [address] [place], [place] [address]     Medical History: Past Medical History:  Diagnosis Date  . Atrial flutter ([place])    a. s/p TEE/DCCV 04/2017; b. CHADS2VASc => 3 (CHF, HTN, age x 1); c. not compliant with Xarelto  . Chronic combined systolic and diastolic CHF (congestive heart failure) ([place])    a. TTE 2/19: EF 20-25%, diffuse HK, mod dilated LA, mildly reduced RVSF, PASP 42; b. TTE 3/19: EF 20-25%, diffuse HK, RVSF nl,  atrial flutter with RVR  . Essential hypertension   . Pulmonary hypertension (HCC)     Medications:  Scheduled:  . chlorhexidine gluconate (MEDLINE KIT)  15 mL Mouth Rinse BID  . mouth rinse  15 mL Mouth Rinse 10 times per day  . pantoprazole  40 mg Intravenous Q12H    Assessment: Patient admitted w/ hematemesis for about a week. Patient obtunded w/ h/o afib on xarelto 20 mg daily. Hgb 4.7 >> 7.1 Hct 15.2 >> 21.5 Patient given IV vitamin K 10 mg, andexxa 800 mg IV x 1 followed by '8mg'$ /min x 120 minutes  Goal of Therapy:  Reversal of GI bleed and stabilization for CBC.  Plan:  Patient given Kcentra 2000 units IV x 1 (20 units/kg). MD informed of Andexxa ordered--MD was okay with giving both. Baseline aPTT elevated @ 54 seconds. Will continue to monitor CBC and will check anti-Xa w/ am labs to assess DOAC reversal.  11/24: Hgb 10.1 Plt 212 INR 1.22 HL= 1.31 (was on Xarelto)  Chinita Greenland PharmD Clinical Pharmacist 02/08/2018

## 2018-02-08 NOTE — Progress Notes (Signed)
Hemoglobin at 23:36 resulted at 8.2.  Pt has no active bleeding noted.  The OG tube continues to be hooked to LIS and there appears to be a slight pink tinge to the output.  Pt remains intubated but will follow commands and helped to turn himself.  Maggie, NP notified of hemoglobin level.  At this time, we will continue to monitor.

## 2018-02-08 NOTE — Progress Notes (Signed)
Patient ID: Nathan [name]., male   DOB: Mar 25, 1950, 67 y.o.   MRN: [id number]  [employer] PROGRESS NOTE  [name]. IHK:[id number] DOB: 01/17/1951 DOA: 02/07/2018 PCP: Patient, [name]  HPI/Subjective: As per nursing staff, patient self extubated today.  Patient breathing okay.  Asking for something to drink.  No abdominal pain.  States he came in for bleeding.  Objective: Vitals:   02/08/18 1200 02/08/18 1300  BP: (!) 177/74 (!) 173/97  Pulse: 75 74  Resp: 13 (!) 9  Temp: 98.6 F (37 C) 98.8 F (37.1 C)  SpO2: 100% 91%    Filed Weights   02/07/18 0414 02/07/18 0824 02/08/18 0500  Weight: 108.9 kg 106.8 kg 113 kg    ROS: Review of Systems  Constitutional: Negative for chills and fever.  Eyes: Negative for blurred vision.  Respiratory: Negative for cough and shortness of breath.   Cardiovascular: Negative for chest pain.  Gastrointestinal: Negative for abdominal pain, constipation, diarrhea, nausea and vomiting.  Genitourinary: Negative for dysuria.  Musculoskeletal: Negative for joint pain.  Neurological: Negative for dizziness and headaches.   Exam: Physical Exam  HENT:  Nose: No mucosal edema.  Mouth/Throat: No oropharyngeal exudate or posterior oropharyngeal edema.  Eyes: Pupils are equal, round, and reactive to light. Conjunctivae, EOM and lids are normal.  Neck: No JVD present. Carotid bruit is not present. No edema present. No thyroid mass and no thyromegaly present.  Cardiovascular: S1 normal and S2 normal. Exam reveals no gallop.  No murmur heard. Pulses:      Dorsalis pedis pulses are 2+ on the right side, and 2+ on the left side.  Respiratory: No respiratory distress. He has no wheezes. He has no rhonchi. He has no rales.  GI: Soft. Bowel sounds are normal. There is no tenderness.  Musculoskeletal:       Right ankle: He exhibits swelling.       Left ankle: He exhibits swelling.  Lymphadenopathy:    He has no cervical adenopathy.   Neurological: He is alert. No cranial nerve deficit.  Skin: Skin is warm. No rash noted. Nails show no clubbing.  Psychiatric: He has a normal mood and affect.      Data Reviewed: Basic Metabolic Panel: Recent Labs  Lab 02/07/18 0430 02/07/18 1124 02/07/18 1721 02/07/18 2336 02/08/18 0333 02/08/18 1035  NA 140 137 137  --  141  --   K 5.6* 6.0* 5.4*  --  3.9  --   CL 109 106 107  --  99  --   CO2 12* 20* 21*  --  30  --   GLUCOSE 136* 166* 117*  --  121*  --   BUN 54* 48* 48*  --  40*  --   CREATININE 3.83* 3.60* 3.22*  --  2.48*  --   CALCIUM 8.5* 8.4* 7.8*  --  7.3*  --   MG  --  2.3 2.2 1.9 1.9 2.0  PHOS  --  5.4* 5.0* 3.2 3.1 3.2   Liver Function Tests: Recent Labs  Lab 02/07/18 0430 02/08/18 0333  AST 30 13*  ALT 14 11  ALKPHOS 38 38  BILITOT 0.5 0.7  PROT 6.6 5.3*  ALBUMIN 3.3* 2.8*   Recent Labs  Lab 02/07/18 0430  LIPASE 29   Recent Labs  Lab 02/07/18 0430  AMMONIA 15   CBC: Recent Labs  Lab 02/07/18 0430  02/07/18 1124 02/07/18 1721 02/07/18 2336 02/08/18 0333 02/08/18 1035  WBC 7.2  --   --   --   --  7.0  --   NEUTROABS 5.5  --   --   --   --  4.9  --   HGB 4.7*   < > 9.1* 9.7* 8.2* 8.8* 10.1*  HCT 15.2*   < > 27.2* 28.9* 23.7* 26.4* 30.6*  MCV 95.0  --   --   --   --  89.2  --   PLT 316  --   --   --   --  212  --    < > = values in this interval not displayed.   Cardiac Enzymes: Recent Labs  Lab 02/07/18 0430  TROPONINI <0.03   BNP (last 3 results) Recent Labs    04/29/17 2023 06/12/17 1839 07/15/17 1456  BNP 1,558.0* 1,839.0* 4,283.0*     CBG: Recent Labs  Lab 02/07/18 0822  GLUCAP 208*    Recent Results (from the past 240 hour(s))  MRSA PCR Screening     Status: None   Collection Time: 02/07/18  8:26 AM  Result Value Ref Range Status   MRSA by PCR NEGATIVE NEGATIVE Final    Comment:        The GeneXpert MRSA Assay (FDA approved for NASAL specimens only), is one component of a comprehensive MRSA  colonization surveillance program. It is not intended to diagnose MRSA infection nor to guide or monitor treatment for MRSA infections. Performed at [facility], [address] [place], [place] [address]      Studies: Dg Abd 1 View  Result Date: 02/07/2018 CLINICAL DATA:  NG tube placement EXAM: ABDOMEN - 1 VIEW COMPARISON:  02/07/2018 FINDINGS: Defibrillator pads overlie the lower chest and upper abdomen. NG tube extends within the proximal stomach region as before. Minor basilar atelectasis. Heart is mildly enlarged. Normal bowel gas pattern. Degenerative changes of the spine IMPRESSION: NG tube extends into the proximal stomach. Normal bowel gas pattern. Electronically Signed   By: Jerilynn Mages.  Shick M.D.   On: 02/07/2018 10:58   Ct Head Wo Contrast  Result Date: 02/07/2018 CLINICAL DATA:  Hematemesis for 1 week. On Xarelto. Altered level of consciousness. EXAM: CT HEAD WITHOUT CONTRAST TECHNIQUE: Contiguous axial images were obtained from the base of the skull through the vertex without intravenous contrast. COMPARISON:  None. FINDINGS: Brain: Mild cerebral atrophy. Ventricular dilatation consistent with central atrophy. Low-attenuation changes in the deep white matter consistent with small vessel ischemia. No mass effect or midline shift. No abnormal extra-axial fluid collections. Gray-white matter junctions are distinct. Basal cisterns are not effaced. No acute intracranial hemorrhage. Vascular: Moderately severe intracranial arterial calcifications are present. Skull: Calvarium appears intact. No acute depressed skull fractures. Sinuses/Orbits: Paranasal sinuses and mastoid air cells are clear. Diffuse opacification of the visualized nasopharynx with low-attenuation material possibly representing fluid or edema. This is incompletely included within the field of view. Suggest CT neck with contrast for further evaluation to exclude neoplasm versus inflammatory etiology. Other: None.  IMPRESSION: 1. No acute intracranial abnormalities. Mild chronic atrophy and small vessel ischemic changes. 2. Opacification of the nasopharynx of nonspecific etiology, incompletely included within the field of view. Suggest CT neck with contrast for further evaluation. Electronically Signed   By: Lucienne Capers M.D.   On: 02/07/2018 06:13   Dg Chest Portable 1 View  Result Date: 02/07/2018 CLINICAL DATA:  Intubation. Hematemesis for 1 week. History of congestive heart failure and hypertension. EXAM: PORTABLE CHEST 1 VIEW COMPARISON:  07/15/2017 FINDINGS: Endotracheal tube tip measures 3.5 cm above the carina. Enteric tube tip is  over the left upper quadrant consistent with location in the upper stomach. Shallow inspiration. Heart size and pulmonary vascularity are normal for technique. No airspace disease or consolidation in the lungs. No blunting of costophrenic angles. No pneumothorax. Mediastinal contours appear intact. IMPRESSION: Appliances appear in satisfactory location. No evidence of active pulmonary disease. Electronically Signed   By: Lucienne Capers M.D.   On: 02/07/2018 05:31    Scheduled Meds: . chlorhexidine gluconate (MEDLINE KIT)  15 mL Mouth Rinse BID  . mouth rinse  15 mL Mouth Rinse 10 times per day  . pantoprazole  40 mg Intravenous Q12H   Continuous Infusions: . fentaNYL infusion INTRAVENOUS Stopped (02/08/18 1055)  . norepinephrine (LEVOPHED) Adult infusion    . octreotide  (SANDOSTATIN)    IV infusion 50 mcg/hr (02/08/18 1428)  . propofol (DIPRIVAN) infusion Stopped (02/08/18 0912)  .  sodium bicarbonate (isotonic) infusion in sterile water 125 mL/hr at 02/08/18 0900  . sodium chloride 0.9% NICU IV bolus      Assessment/Plan:  1. Acute hemorrhagic shock with GI bleed likely upper source.  Empirically on octreotide drip and Protonix pushes.  After multiple transfusions hemoglobin is now up to 10.1.  Hemoglobin was 4.7 when he came in.  Xarelto on hold.  Patient  received IV vitamin K, IV trans-examic acid, and Kcentra.  GI to do a procedure after 48 hours off Xarelto.  From my standpoint can put on clear liquids. 2. Acute hypoxic respiratory failure.  Patient self extubated today and oxygenating okay. 3. Acute kidney injury.  Monitor on sodium bicarb drip.  Creatinine improving.  Patient pulled out IVs so the drip was not currently running while I was in there. 4. Hyperkalemia.  Improved with fluids. 5. History of atrial fibrillation.  Anticoagulation on hold with GI bleed 6. Chronic systolic congestive heart failure.  Monitor closely.   Code Status:     Code Status Orders  (From admission, onward)         Start     Ordered   02/07/18 0606  Full code  Continuous     02/07/18 0605        Code Status History    Date Active Date Inactive Code Status Order ID Comments User Context   07/15/2017 1955 07/19/2017 1908 Full Code [id number]  Gorden Harms, MD Inpatient   06/16/2017 1915 06/17/2017 1850 Full Code [id number]  [name], MD Inpatient   06/15/2017 1702 06/16/2017 1915 Full Code [id number]  Daune Perch, NP Inpatient   06/12/2017 2142 06/15/2017 1523 Full Code [id number]  Demetrios Loll, MD Inpatient   04/29/2017 2333 05/01/2017 1920 Full Code [id number]  Lance Coon, MD Inpatient     Family Communication: As per critical care specialist Disposition Plan: To be determined  Consultants:  Critical care specialist  Gastroenterology  Antibiotics:  Rocephin  Time spent: 25 minutes  [place]

## 2018-02-08 NOTE — Progress Notes (Signed)
Nathan [name] , MD [address] [place], [place], [place], [address] [address], [place], [place], [place], [address] Phone: [phone]  Fax: [phone]   Nathan [name]. is being followed for GI bleed  Day 1 of follow up   Subjective: Extubated - feels well , having a soup    Objective: Vital signs in last 24 hours: Vitals:   02/08/18 0700 02/08/18 0800 02/08/18 0857 02/08/18 0900  BP: 139/72 123/65  137/75  Pulse: (!) 59 (!) 52  (!) 56  Resp: '16 16  16  '$ Temp: 98.4 F (36.9 C) 98.6 F (37 C)  98.8 F (37.1 C)  TempSrc:      SpO2: 100% 100% 100% 100%  Weight:      Height:       Weight change: -2.063 kg  Intake/Output Summary (Last 24 hours) at 02/08/2018 1304 Last data filed at 02/08/2018 0900 Gross per 24 hour  Intake 3503.83 ml  Output 1300 ml  Net 2203.83 ml     Exam: Heart:: Regular rate and rhythm, S1S2 present or without murmur or extra heart sounds Lungs: normal, clear to auscultation and clear to auscultation and percussion Abdomen: soft, nontender, normal bowel sounds   Lab Results: '@LABTEST2'$ @ Micro Results: Recent Results (from the past 240 hour(s))  MRSA PCR Screening     Status: None   Collection Time: 02/07/18  8:26 AM  Result Value Ref Range Status   MRSA by PCR NEGATIVE NEGATIVE Final    Comment:        The GeneXpert MRSA Assay (FDA approved for NASAL specimens only), is one component of a comprehensive MRSA colonization surveillance program. It is not intended to diagnose MRSA infection nor to guide or monitor treatment for MRSA infections. Performed at [facility], [address] [place], [place] [address]    Studies/Results: Dg Abd 1 View  Result Date: 02/07/2018 CLINICAL DATA:  NG tube placement EXAM: ABDOMEN - 1 VIEW COMPARISON:  02/07/2018 FINDINGS: Defibrillator pads overlie the lower chest and upper abdomen. NG tube extends within the proximal stomach region as before. Minor basilar atelectasis. Heart  is mildly enlarged. Normal bowel gas pattern. Degenerative changes of the spine IMPRESSION: NG tube extends into the proximal stomach. Normal bowel gas pattern. Electronically Signed   By: Jerilynn Mages.  Shick M.D.   On: 02/07/2018 10:58   Ct Head Wo Contrast  Result Date: 02/07/2018 CLINICAL DATA:  Hematemesis for 1 week. On Xarelto. Altered level of consciousness. EXAM: CT HEAD WITHOUT CONTRAST TECHNIQUE: Contiguous axial images were obtained from the base of the skull through the vertex without intravenous contrast. COMPARISON:  None. FINDINGS: Brain: Mild cerebral atrophy. Ventricular dilatation consistent with central atrophy. Low-attenuation changes in the deep white matter consistent with small vessel ischemia. No mass effect or midline shift. No abnormal extra-axial fluid collections. Gray-white matter junctions are distinct. Basal cisterns are not effaced. No acute intracranial hemorrhage. Vascular: Moderately severe intracranial arterial calcifications are present. Skull: Calvarium appears intact. No acute depressed skull fractures. Sinuses/Orbits: Paranasal sinuses and mastoid air cells are clear. Diffuse opacification of the visualized nasopharynx with low-attenuation material possibly representing fluid or edema. This is incompletely included within the field of view. Suggest CT neck with contrast for further evaluation to exclude neoplasm versus inflammatory etiology. Other: None. IMPRESSION: 1. No acute intracranial abnormalities. Mild chronic atrophy and small vessel ischemic changes. 2. Opacification of the nasopharynx of nonspecific etiology, incompletely included within the field of view. Suggest CT neck with contrast  for further evaluation. Electronically Signed   By: Lucienne Capers M.D.   On: 02/07/2018 06:13   Dg Chest Portable 1 View  Result Date: 02/07/2018 CLINICAL DATA:  Intubation. Hematemesis for 1 week. History of congestive heart failure and hypertension. EXAM: PORTABLE CHEST 1 VIEW  COMPARISON:  07/15/2017 FINDINGS: Endotracheal tube tip measures 3.5 cm above the carina. Enteric tube tip is over the left upper quadrant consistent with location in the upper stomach. Shallow inspiration. Heart size and pulmonary vascularity are normal for technique. No airspace disease or consolidation in the lungs. No blunting of costophrenic angles. No pneumothorax. Mediastinal contours appear intact. IMPRESSION: Appliances appear in satisfactory location. No evidence of active pulmonary disease. Electronically Signed   By: Lucienne Capers M.D.   On: 02/07/2018 05:31   Medications: I have reviewed the patient's current medications. Scheduled Meds: . chlorhexidine gluconate (MEDLINE KIT)  15 mL Mouth Rinse BID  . mouth rinse  15 mL Mouth Rinse 10 times per day  . pantoprazole  40 mg Intravenous Q12H   Continuous Infusions: . fentaNYL infusion INTRAVENOUS Stopped (02/08/18 1100)  . norepinephrine (LEVOPHED) Adult infusion    . octreotide  (SANDOSTATIN)    IV infusion 50 mcg/hr (02/08/18 0900)  . propofol (DIPRIVAN) infusion Stopped (02/08/18 0855)  .  sodium bicarbonate (isotonic) infusion in sterile water 125 mL/hr at 02/08/18 0900  . sodium chloride 0.9% NICU IV bolus     PRN Meds:.bisacodyl, senna-docusate   Assessment: Active Problems:   Hemorrhagic shock (HCC)   Hyperkalemia   Acute respiratory failure with hypoxia (HCC)  Nathan [name]. is a 67 y.o. y/o male admitted to the ICU with hypotension severe anemia and a history of melena, found to be in AKI .  He had been on Xarelto . INR elevated.  He is not on Coumadin.  CBC shows a normal platelet count, albumin is not markedly low on  labs and this does not suggest a chronic liver disease,less likely to have  it being a variceal bleed.  It is very likely that the INR is elevated due to the effects of Xarelto and due to the effects of renal impairment, larger quantity of Xarelto has been retained in his system and has not been  excreted. No evidence of further bleed since admission. He states today after extubation he was not on any NSAID's, had black colored stools for a few days prior to admission, had been on potassium tablets. Drinking comfortably now. Denies any hematemesis  Plan  1. EGD tomorrow will be two days off xarelto , INR 1.22 2. Continue PPI 3. No further indication for Andexxa/platelets or FFP unless there is evidence of further fresh active bleed, All of them have a very short half life     LOS: 1 day   Nathan Bellows, MD 02/08/2018, 1:04 PM

## 2018-02-09 ENCOUNTER — Inpatient Hospital Stay: Payer: Medicare HMO

## 2018-02-09 ENCOUNTER — Inpatient Hospital Stay: Payer: Medicare HMO | Admitting: Certified Registered Nurse Anesthetist

## 2018-02-09 ENCOUNTER — Encounter
Admission: EM | Disposition: A | Discharge status: Home / Self Care | Payer: Self-pay | Source: Home / Self Care | Attending: Internal Medicine

## 2018-02-09 DIAGNOSIS — D62 Acute posthemorrhagic anemia: Secondary | ICD-10-CM

## 2018-02-09 DIAGNOSIS — K228 Other specified diseases of esophagus: Secondary | ICD-10-CM

## 2018-02-09 DIAGNOSIS — K3189 Other diseases of stomach and duodenum: Secondary | ICD-10-CM

## 2018-02-09 DIAGNOSIS — K2289 Other specified disease of esophagus: Secondary | ICD-10-CM

## 2018-02-09 HISTORY — PX: ESOPHAGOGASTRODUODENOSCOPY (EGD) WITH PROPOFOL: SHX5813

## 2018-02-09 LAB — CALCIUM, IONIZED
CALCIUM, IONIZED, SERUM: 4.3 mg/dL — AB (ref 4.5–5.6)
CALCIUM, IONIZED, SERUM: 4.3 mg/dL — AB (ref 4.5–5.6)
Calcium, Ionized, Serum: 4.5 mg/dL (ref 4.5–5.6)
Calcium, Ionized, Serum: 4.3 mg/dL — ABNORMAL LOW (ref 4.5–5.6)

## 2018-02-09 LAB — CBC WITH DIFFERENTIAL/PLATELET
ABS IMMATURE GRANULOCYTES: 0.04 10*3/uL (ref 0.00–0.07)
BASOS PCT: 1 %
Basophils Absolute: 0 10*3/uL (ref 0.0–0.1)
Eosinophils Absolute: 0.3 10*3/uL (ref 0.0–0.5)
Eosinophils Relative: 3 %
HEMATOCRIT: 30.6 % — AB (ref 39.0–52.0)
HEMOGLOBIN: 9.8 g/dL — AB (ref 13.0–17.0)
IMMATURE GRANULOCYTES: 1 %
LYMPHS ABS: 0.9 10*3/uL (ref 0.7–4.0)
LYMPHS PCT: 10 %
MCH: 29.5 pg (ref 26.0–34.0)
MCHC: 32 g/dL (ref 30.0–36.0)
MCV: 92.2 fL (ref 80.0–100.0)
MONO ABS: 1 10*3/uL (ref 0.1–1.0)
MONOS PCT: 11 %
NEUTROS ABS: 6.6 10*3/uL (ref 1.7–7.7)
NEUTROS PCT: 74 %
PLATELETS: 215 10*3/uL (ref 150–400)
RBC: 3.32 MIL/uL — ABNORMAL LOW (ref 4.22–5.81)
RDW: 14.8 % (ref 11.5–15.5)
WBC: 8.8 10*3/uL (ref 4.0–10.5)
nRBC: 0 % (ref 0.0–0.2)

## 2018-02-09 LAB — HEPARIN LEVEL (UNFRACTIONATED): Heparin Unfractionated: 0.26 IU/mL — ABNORMAL LOW (ref 0.30–0.70)

## 2018-02-09 LAB — COMPREHENSIVE METABOLIC PANEL
ALK PHOS: 42 U/L (ref 38–126)
ALT: 12 U/L (ref 0–44)
AST: 17 U/L (ref 15–41)
Albumin: 3 g/dL — ABNORMAL LOW (ref 3.5–5.0)
Anion gap: 12 (ref 5–15)
BUN: 24 mg/dL — AB (ref 8–23)
CALCIUM: 8.5 mg/dL — AB (ref 8.9–10.3)
CHLORIDE: 103 mmol/L (ref 98–111)
CO2: 32 mmol/L (ref 22–32)
CREATININE: 1.67 mg/dL — AB (ref 0.61–1.24)
GFR calc Af Amer: 48 mL/min — ABNORMAL LOW (ref >60)
GFR, EST NON AFRICAN AMERICAN: 41 mL/min — AB (ref >60)
Glucose, Bld: 118 mg/dL — ABNORMAL HIGH (ref 70–99)
Potassium: 4 mmol/L (ref 3.5–5.1)
Sodium: 147 mmol/L — ABNORMAL HIGH (ref 135–145)
Total Bilirubin: 0.9 mg/dL (ref 0.3–1.2)
Total Protein: 6.1 g/dL — ABNORMAL LOW (ref 6.5–8.1)

## 2018-02-09 LAB — MAGNESIUM: Magnesium: 2 mg/dL (ref 1.7–2.4)

## 2018-02-09 LAB — HEMOGLOBIN AND HEMATOCRIT, BLOOD
HCT: 34.4 % — ABNORMAL LOW (ref 39.0–52.0)
Hemoglobin: 11 g/dL — ABNORMAL LOW (ref 13.0–17.0)

## 2018-02-09 LAB — PHOSPHORUS: Phosphorus: 2.7 mg/dL (ref 2.5–4.6)

## 2018-02-09 LAB — PROCALCITONIN

## 2018-02-09 LAB — HIV ANTIBODY (ROUTINE TESTING W REFLEX): HIV SCREEN 4TH GENERATION: NONREACTIVE

## 2018-02-09 LAB — MASSIVE TRANSFUSION PROTOCOL ORDER (BLOOD BANK NOTIFICATION)

## 2018-02-09 SURGERY — ESOPHAGOGASTRODUODENOSCOPY (EGD) WITH PROPOFOL
Anesthesia: General

## 2018-02-09 MED ORDER — NICARDIPINE HCL IN NACL 20-0.86 MG/200ML-% IV SOLN
3.0000 mg/h | INTRAVENOUS | Status: DC
  Administered 2018-02-09 – 2018-02-10 (×2): 3 mg/h via INTRAVENOUS
  Filled 2018-02-09 (×3): qty 200

## 2018-02-09 MED ORDER — LIDOCAINE HCL (CARDIAC) PF 100 MG/5ML IV SOSY
PREFILLED_SYRINGE | INTRAVENOUS | Status: DC | PRN
  Administered 2018-02-09: 50 mg via INTRAVENOUS

## 2018-02-09 MED ORDER — HYDRALAZINE HCL 25 MG PO TABS
25.0000 mg | ORAL_TABLET | Freq: Three times a day (TID) | ORAL | Status: DC
  Administered 2018-02-09 – 2018-02-12 (×6): 25 mg via ORAL
  Filled 2018-02-09 (×9): qty 1

## 2018-02-09 MED ORDER — METOPROLOL TARTRATE 5 MG/5ML IV SOLN
5.0000 mg | Freq: Four times a day (QID) | INTRAVENOUS | Status: DC
  Administered 2018-02-09 – 2018-02-10 (×6): 5 mg via INTRAVENOUS
  Filled 2018-02-09 (×6): qty 5

## 2018-02-09 MED ORDER — SODIUM CHLORIDE 0.9 % IV SOLN: INTRAVENOUS | Status: DC

## 2018-02-09 MED ORDER — SODIUM CHLORIDE 0.9 % IV SOLN
INTRAVENOUS | Status: DC
  Administered 2018-02-09: 1000 mL via INTRAVENOUS

## 2018-02-09 MED ORDER — PROPOFOL 10 MG/ML IV BOLUS
INTRAVENOUS | Status: AC
  Filled 2018-02-09: qty 20

## 2018-02-09 MED ORDER — HYDRALAZINE HCL 20 MG/ML IJ SOLN
10.0000 mg | Freq: Once | INTRAMUSCULAR | Status: AC
  Administered 2018-02-09: 10 mg via INTRAVENOUS
  Filled 2018-02-09: qty 1

## 2018-02-09 MED ORDER — PEG 3350-KCL-NA BICARB-NACL 420 G PO SOLR
4000.0000 mL | Freq: Once | ORAL | Status: AC
  Administered 2018-02-09: 4000 mL via ORAL
  Filled 2018-02-09: qty 4000

## 2018-02-09 MED ORDER — AMLODIPINE BESYLATE 5 MG PO TABS
5.0000 mg | ORAL_TABLET | Freq: Every day | ORAL | Status: DC
  Administered 2018-02-09 – 2018-02-12 (×4): 5 mg via ORAL
  Filled 2018-02-09 (×4): qty 1

## 2018-02-09 MED ORDER — SUCCINYLCHOLINE CHLORIDE 20 MG/ML IJ SOLN
INTRAMUSCULAR | Status: AC
  Filled 2018-02-09: qty 1

## 2018-02-09 MED ORDER — SUCCINYLCHOLINE CHLORIDE 20 MG/ML IJ SOLN
INTRAMUSCULAR | Status: DC | PRN
  Administered 2018-02-09: 100 mg via INTRAVENOUS

## 2018-02-09 MED ORDER — LACTATED RINGERS IV SOLN
INTRAVENOUS | Status: DC | PRN
  Administered 2018-02-09: 12:00:00 via INTRAVENOUS

## 2018-02-09 MED ORDER — PROPOFOL 10 MG/ML IV BOLUS
INTRAVENOUS | Status: DC | PRN
  Administered 2018-02-09: 20 mg via INTRAVENOUS
  Administered 2018-02-09: 140 mg via INTRAVENOUS
  Administered 2018-02-09: 20 mg via INTRAVENOUS

## 2018-02-09 MED ORDER — PHENYLEPHRINE HCL 10 MG/ML IJ SOLN
INTRAMUSCULAR | Status: DC | PRN
  Administered 2018-02-09: 100 ug via INTRAVENOUS

## 2018-02-09 MED ORDER — HYDRALAZINE HCL 20 MG/ML IJ SOLN
20.0000 mg | Freq: Once | INTRAMUSCULAR | Status: AC
  Administered 2018-02-09: 20 mg via INTRAVENOUS
  Filled 2018-02-09: qty 1

## 2018-02-09 NOTE — Transfer of Care (Signed)
Immediate Anesthesia Transfer of Care Note  Patient: Nathan [name].  Procedure(s) Performed: ESOPHAGOGASTRODUODENOSCOPY (EGD) WITH PROPOFOL (N/A )  Patient Location: PACU  Anesthesia Type:General  Level of Consciousness: awake, alert  and oriented  Airway & Oxygen Therapy: Patient Spontanous Breathing and Patient connected to nasal cannula oxygen  Post-op Assessment: Report given to RN and Post -op Vital signs reviewed and stable  Post vital signs: Reviewed and stable  Last Vitals:  Vitals Value Taken Time  BP 171/93 02/09/2018 12:35 PM  Temp    Pulse 96 02/09/2018 12:39 PM  Resp 14 02/09/2018 12:39 PM  SpO2 99 % 02/09/2018 12:39 PM  Vitals shown include unvalidated device data.  Last Pain:  Vitals:   02/09/18 1129  TempSrc:   PainSc: 0-No pain         Complications: No apparent anesthesia complications

## 2018-02-09 NOTE — Anesthesia Procedure Notes (Signed)
Procedure Name: Intubation Date/Time: 02/09/2018 12:12 PM Performed by: Willette Alma, CRNA Pre-anesthesia Checklist: Patient identified, Patient being monitored, Timeout performed, Emergency Drugs available and Suction available Patient Re-evaluated:Patient Re-evaluated prior to induction Oxygen Delivery Method: Circle system utilized Preoxygenation: Pre-oxygenation with 100% oxygen Induction Type: IV induction, Cricoid Pressure applied and Rapid sequence Laryngoscope Size: Mac and 4 (McGrath video) Grade View: Grade I Tube type: Oral Tube size: 7.5 mm Number of attempts: 1 Airway Equipment and Method: Stylet and Video-laryngoscopy Placement Confirmation: ETT inserted through vocal cords under direct vision,  positive ETCO2 and breath sounds checked- equal and bilateral Secured at: 23 cm Tube secured with: Tape Dental Injury: Teeth and Oropharynx as per pre-operative assessment

## 2018-02-09 NOTE — Progress Notes (Signed)
Dr. Soyla Murphy gave order to transfer patient to stepdown level of care.

## 2018-02-09 NOTE — Progress Notes (Signed)
Patient transferred to Endo with this RN and Roselyn Reef, orderly.  Report given to Lorrie, RN who is taking over patient's care.

## 2018-02-09 NOTE — Progress Notes (Signed)
[place] [facility] Indication: hemorrhagic shock possibly d/t xarelto   No Known Allergies  Patient Measurements: Height: _0  (188 cm) Weight: 249 lb 1.9 oz (113 kg) IBW/kg (Calculated) : 82.2  Vital Signs: Temp: 99.7 F (37.6 C) (11/25 0530) Temp Source: Bladder (11/25 0415) BP: 169/88 (11/25 0530) Pulse Rate: 72 (11/25 0530) Intake/Output from previous day: 11/24 0701 - 11/25 0700 In: 2854.3 [I.V.:2854.3] Out: 3125 [Urine:3125] Intake/Output from this shift: Total I/O In: 1391.6 [I.V.:1391.6] Out: 1575 [Urine:1575]  Labs: Recent Labs    02/07/18 0430 02/07/18 0618  02/07/18 1721  02/08/18 0333  02/08/18 1704 02/08/18 2245 02/09/18 0527  WBC 7.2  --   --   --   --  7.0  --   --   --  8.8  HGB 4.7* 7.1*   < > 9.7*   < > 8.8*   < > 10.3* 10.1* 9.8*  HCT 15.2* 21.5*   < > 28.9*   < > 26.4*   < > 30.7* 30.4* 30.6*  PLT 316  --   --   --   --  212  --   --   --  215  APTT  --  54*  --   --   --   --   --   --   --   --   CREATININE 3.83*  --    < > 3.22*  --  2.48*  --   --   --  1.67*  MG  --   --    < > 2.2   < > 1.9   < > 2.1 2.2 2.0  PHOS  --   --    < > 5.0*   < > 3.1   < > 3.7 2.9 2.7  ALBUMIN 3.3*  --   --   --   --  2.8*  --   --   --  3.0*  PROT 6.6  --   --   --   --  5.3*  --   --   --  6.1*  AST 30  --   --   --   --  13*  --   --   --  17  ALT 14  --   --   --   --  11  --   --   --  12  ALKPHOS 38  --   --   --   --  38  --   --   --  42  BILITOT 0.5  --   --   --   --  0.7  --   --   --  0.9   < > = values in this interval not displayed.   Estimated Creatinine Clearance: 58.2 mL/min (A) (by C-G formula based on SCr of 1.67 mg/dL (H)).   Microbiology: Recent Results (from the past 720 hour(s))  MRSA PCR Screening     Status: None   Collection Time: 02/07/18  8:26 AM  Result Value Ref Range Status   MRSA by PCR NEGATIVE NEGATIVE Final    Comment:        The GeneXpert MRSA Assay (FDA approved for  NASAL specimens only), is one component of a comprehensive MRSA colonization surveillance program. It is not intended to diagnose MRSA infection nor to guide or monitor treatment for MRSA infections. Performed at [facility], [address] [place],  [place] [address]     Medical History: Past Medical History:  Diagnosis Date  . Atrial flutter ([place])    a. s/p TEE/DCCV 04/2017; b. CHADS2VASc => 3 (CHF, HTN, age x 1); c. not compliant with Xarelto  . Chronic combined systolic and diastolic CHF (congestive heart failure) ([place])    a. TTE 2/19: EF 20-25%, diffuse HK, mod dilated LA, mildly reduced RVSF, PASP 42; b. TTE 3/19: EF 20-25%, diffuse HK, RVSF nl, atrial flutter with RVR  . Essential hypertension   . Pulmonary hypertension (HCC)     Medications:  Scheduled:  . chlorhexidine gluconate (MEDLINE KIT)  15 mL Mouth Rinse BID  . pantoprazole  40 mg Intravenous Q12H    Assessment: Patient admitted w/ hematemesis for about a week. Patient obtunded w/ h/o afib on xarelto 20 mg daily. Hgb 4.7 >> 7.1 Hct 15.2 >> 21.5 Patient given IV vitamin K 10 mg, andexxa 800 mg IV x 1 followed by 49m/min x 120 minutes  Goal of Therapy:  Reversal of GI bleed and stabilization for CBC.  Plan:  Patient given Kcentra 2000 units IV x 1 (20 units/kg). MD informed of Andexxa ordered--MD was okay with giving both. Baseline aPTT elevated @ 54 seconds. Will continue to monitor CBC and will check anti-Xa w/ am labs to assess DOAC reversal.  11/25 @ 0500 anti-Xa 0.26 Xarelto is mostly out of patient's system. hgb 9.8 down from 10.1 this am. Will check another anti-Xa/aPTT w/ am labs.  DTobie Lords PharmD, BCPS Clinical Pharmacist 02/09/2018

## 2018-02-09 NOTE — Progress Notes (Addendum)
RN spoke with Dr. Soyla Murphy and made MD aware that patient's blood pressure is elevated 180/102 and that 5 mg IV metoprolol was given around 1430 and RN also made MD aware that patient has runs of SVT not sustained with current rhythm sinus with PVCs.  MD acknowledged and stated he would look at the patient's labs.  No orders given at this time.

## 2018-02-09 NOTE — Progress Notes (Signed)
Discussed with Dr. Soyla Murphy that patient's blood pressure remains elevated 171/87 even after oral antihypertensives have been given and IV.  MD stated he would order cardene drip to keep systolic BP <346.

## 2018-02-09 NOTE — Progress Notes (Signed)
Name: Nathan [name]. MRN: [id number] DOB: [birth year]     CONSULTATION DATE: 02/07/2018  Subjective & Objectives: HTN and tolerating [place]. Extubated on 02/08/2018  PAST MEDICAL HISTORY :   has a past medical history of Atrial flutter ([place]), Chronic combined systolic and diastolic CHF (congestive heart failure) ([place]), Essential hypertension, and Pulmonary hypertension ([place]).  has a past surgical history that includes TEE without cardioversion (N/A, 05/01/2017); CARDIOVERSION (N/A, 05/01/2017); TEE without cardioversion (N/A, 06/16/2017); A-FLUTTER ABLATION (N/A, 06/16/2017); RIGHT/LEFT HEART CATH AND CORONARY ANGIOGRAPHY (N/A, 07/18/2017); Cardiac catheterization; and Coronary angioplasty. Prior to Admission medications   Medication Sig Start Date End Date Taking? Authorizing Provider  furosemide (LASIX) 40 MG tablet Take 1 tablet (40 mg total) by mouth 2 (two) times daily. 12/31/17  Yes [name], MD  losartan (COZAAR) 100 MG tablet Take 100 mg by mouth daily.   Yes [provider]  rivaroxaban (XARELTO) 20 MG TABS tablet Take 1 tablet (20 mg total) by mouth daily with supper. 07/22/17  Yes [name], MD  atorvastatin (LIPITOR) 40 MG tablet Take 1 tablet (40 mg total) by mouth daily at 6 PM. 07/22/17   Gollan, Kathlene November, MD  metoprolol succinate (TOPROL-XL) 25 MG 24 hr tablet Take 25 mg by mouth 2 (two) times daily.    [provider]  potassium chloride SA (K-DUR,KLOR-CON) 20 MEQ tablet Take 20 mEq by mouth 2 (two) times daily.    [provider]  spironolactone (ALDACTONE) 25 MG tablet Take 25 mg by mouth daily.    [provider]   No Known Allergies  FAMILY HISTORY:  family history includes Alzheimer's disease in his father and mother. SOCIAL HISTORY:  reports that he quit smoking about 9 months ago. He has never used smokeless tobacco. He reports that he drinks about 3.0 standard drinks of alcohol per week. He reports that he does not use  drugs.  REVIEW OF SYSTEMS:   Unable to obtain due to critical illness   VITAL SIGNS: Temp:  [98 F (36.7 C)-99.9 F (37.7 C)] 98 F (36.7 C) (11/25 1324) Pulse Rate:  [70-100] 87 (11/25 1530) Resp:  [7-26] 16 (11/25 1530) BP: (154-191)/(60-115) 180/102 (11/25 1530) SpO2:  [87 %-100 %] 96 % (11/25 1530)  Physical Examination: A &O x 3 and no focal neuro deficits [place], no distress, bilateral equal air entry with no adventitious sounds S1 & S2 are audible with no murmur Benign abdominal exam with feeble peristalsis Wasted extremities and no leg edema   ASSESSMENT / PLAN: Acute respiratory failure. self extubated on 02/09/2048 and tolerating [place] -Monitor work of breathing and O2 sat.  Altered mental status (improved) with toxic metabolic encephalopathy and cerebral hypoperfusion. CT head did not show acute intracranial abnormalities -Monitor neuro status  HTN -Optimize antihypertensives and monitor hemodynamics  Acute blood loss anemia with hemorrhagic shock (resolved). -Monitor hemodynamics  GI bleeding. Esophagitis with possible Barrett's esophagus -Plan per GI: d/c octreotide, c/w PPI, colonoscope in am and continue to hold Xarelto.  AKI (improved),hyperkalemia and intravascular volume depletion on top of CKD -Optimize hydration, temporizing agent for hyperkalemia, avoid nephrotoxins, monitor renal panel and urine output -Consider nephrology consult  Metabolic acidosis with lactic acidemia dure to tissue hypoperfusion (improved). procalcitonin 0.11 Sepsis is less likey. -D/C Bicarb gtt.  HFrEF 35 to 40%, A. fib status post DCCV. -Rate control -Watch for volume overload  Coagulopathy with Xarelto for A. Fib.S/P reversal with Andexxa + Kcentra + vitamin K because of  GI bleeding -Monitor coags  Full code  DVT and GI prophylaxis. Continue supportive care  Critical care time 40 minutes

## 2018-02-09 NOTE — Progress Notes (Signed)
Dr. Soyla Murphy present and gave order for one dose of hydralazine 10 mg IV push once.

## 2018-02-09 NOTE — Progress Notes (Signed)
   Vonda Antigua, MD [address] [place], [place], [place], [address] [address], [place], [place], [place], [address] Phone: [phone]  Fax: [phone]   Subjective: No further melena.  Patient awake and comfortable this morning.  No abdominal pain.  Hemoglobin 9.8 this morning   Objective: Exam: Vital signs in last 24 hours: Vitals:   02/09/18 0630 02/09/18 0700 02/09/18 0900 02/09/18 1000  BP: (!) 163/92 (!) 170/75 (!) 167/103 (!) 168/90  Pulse: 72 70 75 70  Resp: '11 14 12 13  '$ Temp:      TempSrc:      SpO2: 99% 100% 97% 99%  Weight:      Height:       Weight change:   Intake/Output Summary (Last 24 hours) at 02/09/2018 1029 Last data filed at 02/09/2018 0805 Gross per 24 hour  Intake 2962.6 ml  Output 3125 ml  Net -162.4 ml    General: No acute distress, AAO x3 Abd: Soft, NT/ND, No HSM Skin: Warm, no rashes Neck: Supple, Trachea midline   Lab Results: Lab Results  Component Value Date   WBC 8.8 02/09/2018   HGB 9.8 (L) 02/09/2018   HCT 30.6 (L) 02/09/2018   MCV 92.2 02/09/2018   PLT 215 02/09/2018   Micro Results: Recent Results (from the past 240 hour(s))  MRSA PCR Screening     Status: None   Collection Time: 02/07/18  8:26 AM  Result Value Ref Range Status   MRSA by PCR NEGATIVE NEGATIVE Final    Comment:        The GeneXpert MRSA Assay (FDA approved for NASAL specimens only), is one component of a comprehensive MRSA colonization surveillance program. It is not intended to diagnose MRSA infection nor to guide or monitor treatment for MRSA infections. Performed at [facility], [address], [place], [place] [address]    Studies/Results: No results found. Medications:  Scheduled Meds: . chlorhexidine gluconate (MEDLINE KIT)  15 mL Mouth Rinse BID  . pantoprazole  40 mg Intravenous Q12H   Continuous Infusions: . sodium chloride    . fentaNYL infusion INTRAVENOUS Stopped (02/08/18 1055)  . norepinephrine  (LEVOPHED) Adult infusion    . octreotide  (SANDOSTATIN)    IV infusion 50 mcg/hr (02/09/18 0805)  . propofol (DIPRIVAN) infusion Stopped (02/08/18 0912)  .  sodium bicarbonate (isotonic) infusion in sterile water 125 mL/hr at 02/09/18 1013  . sodium chloride 0.9% NICU IV bolus     PRN Meds:.bisacodyl, senna-docusate   Assessment: Active Problems:   Hemorrhagic shock (HCC)   Hyperkalemia   Acute respiratory failure with hypoxia (HCC)    Plan: We will plan on EGD today for further evaluation for source of melena PPI IV twice daily  Continue serial CBCs and transfuse PRN Avoid NSAIDs Maintain 2 large-bore IV lines Please page GI with any acute hemodynamic changes, or signs of active GI bleeding  Xarelto has been held and INR has normalized on last check yesterday, it was likely elevated due to Xarelto use in the setting of elevated creatinine  I have discussed alternative options, risks & benefits,  which include, but are not limited to, bleeding, infection, perforation,respiratory complication & drug reaction.  The patient agrees with this plan & written consent will be obtained.    Pt denies any previous colonoscopy and should follow up in GI clinic to discuss elective screening colonoscopy.     LOS: 2 days   Vonda Antigua, MD 02/09/2018, 10:29 AM

## 2018-02-09 NOTE — Anesthesia Preprocedure Evaluation (Addendum)
Anesthesia Evaluation  Patient identified by MRN, date of birth, ID band Patient awake    Reviewed: Allergy & Precautions, H&P , NPO status , Patient's Chart, lab work & pertinent test results  Airway Mallampati: III       Dental  (+) Missing, Chipped, Poor Dentition   Pulmonary former smoker,           Cardiovascular hypertension, +CHF  + dysrhythmias Atrial Fibrillation   Echo June 2019: improved from May 2019 - Left ventricle: The cavity size was normal. Systolic function was   moderately reduced. The estimated ejection fraction was in the   range of 35% to 40%. Diffuse hypokinesis. Regional wall motion   abnormalities cannot be excluded. Doppler parameters are   consistent with abnormal left ventricular relaxation (grade 1   diastolic dysfunction). - Left atrium: The atrium was mildly dilated. - Right ventricle: Systolic function was low normal. - Pulmonary arteries: Systolic pressure was within the normal   Range.  Pulmonary hypertension noted on problem list, most recently May 2019 in setting of fluid overload. RHC/LHC May 2019: 1.  Heavily calcified coronary arteries with mild nonobstructive disease.  Left dominant system. 2.  Left ventricular angiography was not performed. 3.  Right heart catheterization showed moderately to severely elevated filling pressures, severe pulmonary hypertension and severely reduced cardiac output.   Neuro/Psych negative neurological ROS  negative psych ROS   GI/Hepatic negative GI ROS, Neg liver ROS,   Endo/Other  negative endocrine ROS  Renal/GU CRFRenal disease     Musculoskeletal   Abdominal   Peds  Hematology negative hematology ROS (+)   Anesthesia Other Findings Past Medical History: No date: Atrial flutter (HCC)     Comment:  a. s/p TEE/DCCV 04/2017; b. CHADS2VASc => 3 (CHF, HTN,               age x 1); c. not compliant with Xarelto No date: Chronic combined  systolic and diastolic CHF (congestive  heart failure) ([place])     Comment:  a. TTE 2/19: EF 20-25%, diffuse HK, mod dilated LA,               mildly reduced RVSF, PASP 42; b. TTE 3/19: EF 20-25%,               diffuse HK, RVSF nl, atrial flutter with RVR No date: Essential hypertension No date: Pulmonary hypertension ([place])  Past Surgical History: 06/16/2017: A-FLUTTER ABLATION; N/A     Comment:  Procedure: A-FLUTTER ABLATION;  Surgeon: [name], MD;  Location: [facility];  Service:               Cardiovascular;  Laterality: N/A; No date: CARDIAC CATHETERIZATION 05/01/2017: CARDIOVERSION; N/A     Comment:  Procedure: CARDIOVERSION;  Surgeon: [name],               MD;  Location: [facility] ORS;  Service: Cardiovascular;                Laterality: N/A; No date: CORONARY ANGIOPLASTY 07/18/2017: RIGHT/LEFT HEART CATH AND CORONARY ANGIOGRAPHY; N/A     Comment:  Procedure: RIGHT/LEFT HEART CATH AND CORONARY               ANGIOGRAPHY;  Surgeon: [name], MD;  Location:               [facility];  Service: Cardiovascular;                Laterality: N/A; 05/01/2017: TEE WITHOUT CARDIOVERSION; N/A     Comment:  Procedure: TRANSESOPHAGEAL ECHOCARDIOGRAM (TEE);                Surgeon: [name], MD;  Location: [facility] OR[facility];                Service: Cardiovascular;  Laterality: N/A; 06/16/2017: TEE WITHOUT CARDIOVERSION; N/A     Comment:  Procedure: TRANSESOPHAGEAL ECHOCARDIOGRAM (TEE);                Surgeon: [name], MD;  Location: [facility];              Service: Cardiovascular;  Laterality: N/A;  BMI    Body Mass Index:  31.99 kg/m      Reproductive/Obstetrics negative OB ROS                          Anesthesia Physical Anesthesia Plan  ASA: III  Anesthesia Plan: General ETT   Post-op Pain Management:    Induction:   PONV Risk Score and Plan: Ondansetron  Airway Management Planned:    Additional Equipment:   Intra-op Plan:   Post-operative Plan:   Informed Consent: I have reviewed the patients History and Physical, chart, labs and discussed the procedure including the risks, benefits and alternatives for the proposed anesthesia with the patient or authorized representative who has indicated his/her understanding and acceptance.   Dental Advisory Given  Plan Discussed with: Anesthesiologist and CRNA  Anesthesia Plan Comments: (Pt admitted with Hgb <5 s/p multiple transfusions, now Hgb >9.  Self-extubated yesterday in ICU, now SpO2 98% with 2L [place].  No tachypnea, denies SOB.  +hematemesis yesterday.  He does report difficulty swallowing saliva today, he is not sure why.)       Anesthesia Quick Evaluation

## 2018-02-09 NOTE — Anesthesia Post-op Follow-up Note (Signed)
Anesthesia QCDR form completed.        

## 2018-02-09 NOTE — Progress Notes (Signed)
Patient ID: Nathan Triggs., male   DOB: 1950-12-15, 67 y.o.   MRN: [id number]  [employer] PROGRESS NOTE  Nathan Glazier. EVO:[id number] DOB: 02/26/1951 DOA: 02/07/2018 PCP: Patient, [name]  HPI/Subjective: Doing fine on [place] today. No acute events overnight.  Objective: Vitals:   02/09/18 1530 02/09/18 1600  BP: (!) 180/102 (!) 178/89  Pulse: 87 99  Resp: 16 (!) 9  Temp:    SpO2: 96% 93%    Filed Weights   02/07/18 0414 02/07/18 0824 02/08/18 0500  Weight: 108.9 kg 106.8 kg 113 kg    ROS: Review of Systems  Constitutional: Negative for chills and fever.  Eyes: Negative for blurred vision.  Respiratory: Negative for cough and shortness of breath.   Cardiovascular: Negative for chest pain.  Gastrointestinal: Negative for abdominal pain, constipation, diarrhea, nausea and vomiting.  Genitourinary: Negative for dysuria.  Musculoskeletal: Negative for joint pain.  Neurological: Negative for dizziness and headaches.   Exam: Physical Exam  HENT:  Nose: No mucosal edema.  Mouth/Throat: No oropharyngeal exudate or posterior oropharyngeal edema.  Eyes: Pupils are equal, round, and reactive to light. Conjunctivae, EOM and lids are normal.  Neck: No JVD present. Carotid bruit is not present. No edema present. No thyroid mass and no thyromegaly present.  Cardiovascular: S1 normal and S2 normal. Exam reveals no gallop.  No murmur heard. Pulses:      Dorsalis pedis pulses are 2+ on the right side, and 2+ on the left side.  Respiratory: No respiratory distress. He has no wheezes. He has no rhonchi. He has no rales.  [place] in place  GI: Soft. Bowel sounds are normal. There is no tenderness.  Musculoskeletal:       Right ankle: He exhibits swelling.       Left ankle: He exhibits swelling.  Lymphadenopathy:    He has no cervical adenopathy.  Neurological: He is alert. No cranial nerve deficit.  Skin: Skin is warm. No rash noted. Nails show no clubbing.  Psychiatric: He has a  normal mood and affect.      Data Reviewed: Basic Metabolic Panel: Recent Labs  Lab 02/07/18 0430  02/07/18 1124 02/07/18 1721  02/08/18 0333 02/08/18 1035 02/08/18 1704 02/08/18 2245 02/09/18 0527  NA 140  --  137 137  --  141  --   --   --  147*  K 5.6*  --  6.0* 5.4*  --  3.9  --   --   --  4.0  CL 109  --  106 107  --  99  --   --   --  103  CO2 12*  --  20* 21*  --  30  --   --   --  32  GLUCOSE 136*  --  166* 117*  --  121*  --   --   --  118*  BUN 54*  --  48* 48*  --  40*  --   --   --  24*  CREATININE 3.83*  --  3.60* 3.22*  --  2.48*  --   --   --  1.67*  CALCIUM 8.5*  --  8.4* 7.8*  --  7.3*  --   --   --  8.5*  MG  --    < > 2.3 2.2   < > 1.9 2.0 2.1 2.2 2.0  PHOS  --    < > 5.4* 5.0*   < > 3.1 3.2 3.7  2.9 2.7   < > = values in this interval not displayed.   Liver Function Tests: Recent Labs  Lab 02/07/18 0430 02/08/18 0333 02/09/18 0527  AST 30 13* 17  ALT _0 ALKPHOS 38 38 42  BILITOT 0.5 0.7 0.9  PROT 6.6 5.3* 6.1*  ALBUMIN 3.3* 2.8* 3.0*   Recent Labs  Lab 02/07/18 0430  LIPASE 29   Recent Labs  Lab 02/07/18 0430  AMMONIA 15   CBC: Recent Labs  Lab 02/07/18 0430  02/08/18 0333 02/08/18 1035 02/08/18 1704 02/08/18 2245 02/09/18 0527  WBC 7.2  --  7.0  --   --   --  8.8  NEUTROABS 5.5  --  4.9  --   --   --  6.6  HGB 4.7*   < > 8.8* 10.1* 10.3* 10.1* 9.8*  HCT 15.2*   < > 26.4* 30.6* 30.7* 30.4* 30.6*  MCV 95.0  --  89.2  --   --   --  92.2  PLT 316  --  212  --   --   --  215   < > = values in this interval not displayed.   Cardiac Enzymes: Recent Labs  Lab 02/07/18 0430  TROPONINI <0.03   BNP (last 3 results) Recent Labs    04/29/17 2023 06/12/17 1839 07/15/17 1456  BNP 1,558.0* 1,839.0* 4,283.0*     CBG: Recent Labs  Lab 02/07/18 0822  GLUCAP 208*    Recent Results (from the past 240 hour(s))  MRSA PCR Screening     Status: None   Collection Time: 02/07/18  8:26 AM  Result Value Ref Range Status    MRSA by PCR NEGATIVE NEGATIVE Final    Comment:        The GeneXpert MRSA Assay (FDA approved for NASAL specimens only), is one component of a comprehensive MRSA colonization surveillance program. It is not intended to diagnose MRSA infection nor to guide or monitor treatment for MRSA infections. Performed at [facility], [address], [place], [place] [address]      Studies: No results found.  Scheduled Meds: . amLODipine  5 mg Oral Daily  . chlorhexidine gluconate (MEDLINE KIT)  15 mL Mouth Rinse BID  . hydrALAZINE  25 mg Oral Q8H  . metoprolol tartrate  5 mg Intravenous Q6H  . pantoprazole  40 mg Intravenous Q12H  . polyethylene glycol-electrolytes  4,000 mL Oral Once   Continuous Infusions: . sodium chloride 0.9% NICU IV bolus      Assessment/Plan:  1. Acute hemorrhagic shock with GI bleed likely upper source.  On octreotide drip and Protonix pushes.  After multiple transfusions hemoglobin is stable. Xarelto on hold.  Patient received IV vitamin K, IV trans-examic acid, and Kcentra. Endoscopy today. 2. Acute hypoxic respiratory failure.  Stable on . Wean as able. 3. Acute kidney injury- much improved. Monitor. 4. Hyperkalemia.  Resolved. 5. History of atrial fibrillation.  Anticoagulation on hold with GI bleed 6. Chronic systolic congestive heart failure.  Monitor closely. No signs of acute exacerbation.   Code Status:     Code Status Orders  (From admission, onward)         Start     Ordered   02/07/18 0606  Full code  Continuous     02/07/18 0605        Code Status History    Date Active Date Inactive Code Status Order ID Comments User Context   07/15/2017 1955 07/19/2017  1908 Full Code [id number]  SalaryAvel Peace, MD Inpatient   06/16/2017 1915 06/17/2017 1850 Full Code [id number]  [name], MD Inpatient   06/15/2017 1702 06/16/2017 1915 Full Code [id number]  Daune Perch, NP Inpatient   06/12/2017 2142 06/15/2017 1523 Full Code [id number]   Demetrios Loll, MD Inpatient   04/29/2017 2333 05/01/2017 1920 Full Code [id number]  Lance Coon, MD Inpatient     Family Communication: As per critical care specialist Disposition Plan: To be determined  Consultants:  Critical care specialist  Gastroenterology  Antibiotics:  Rocephin  Time spent: 33 minutes  [place]

## 2018-02-09 NOTE — Op Note (Signed)
[facility] Gastroenterology Patient Name: Nathan [name] Procedure Date: 02/09/2018 12:08 PM MRN: [id number] Account #: [id number] Date of Birth: 1950/04/24 Admit Type: Inpatient Age: 67 Room: [facility] ENDO ROOM 4 Gender: Male Note Status: Finalized Procedure:            Upper GI endoscopy Indications:          Acute post hemorrhagic anemia Providers:            Leverne Amrhein B. Bonna Gains MD, MD Referring MD:         Forest Gleason Md, MD (Referring MD) Medicines:            Monitored Anesthesia Care Complications:        No immediate complications. Procedure:            Pre-Anesthesia Assessment:                       - The risks and benefits of the procedure and the                        sedation options and risks were discussed with the                        patient. All questions were answered and informed                        consent was obtained.                       - Patient identification and proposed procedure were                        verified prior to the procedure.                       - ASA Grade Assessment: III - A patient with severe                        systemic disease.                       After obtaining informed consent, the endoscope was                        passed under direct vision. Throughout the procedure,                        the patient's blood pressure, pulse, and oxygen                        saturations were monitored continuously. The Endoscope                        was introduced through the mouth, and advanced to the                        third part of duodenum. The upper GI endoscopy was                        accomplished with ease. The patient tolerated the  procedure well. Findings:      Mild mucosal changes characterized by erythema and red spot, possible       site of previous bleed, were found in the distal esophagus. This could       be from NG tube trauma or possible pill esophagitis from  medications       like potassium which patient was taking.      There were esophageal mucosal changes suspicious for short-segment       Barrett's esophagus present at the gastroesophageal junction. The       maximum longitudinal extent of these mucosal changes was 1 cm in length.       Biopsies were not done in order to prevent confounding of patient's GI       bleeding on this admission.      Multiple dispersed, 5 to 7 mm non-bleeding erosions were found in the       gastric antrum. There were no stigmata of recent bleeding. Biopsies were       not done due to patient's GI bleed on this admission.      The examined duodenum was normal.      No signs of active bleeding on today's exam. Impression:           - Erythematous mucosa in the esophagus. The red spot                        noted here could be due to NG tube trauma or pill                        esophagitis.                       - Esophageal mucosal changes suspicious for                        short-segment Barrett's esophagus.                       - Non-bleeding erosive gastropathy.                       - Normal examined duodenum.                       - No signs of active bleeding on today's exam.                       - No specimens collected.                       - The gastric erosions and red spot in the esophagus                        from possible NG tube trauma vs esophagitis can                        contribute to his anemia at presentation. However,                        given the acute drop to 4.7, hypotension, requiring  intubation, and no previous colonoscopy, and need to                        resume Xarelto, a colonoscopy on this admission to rule                        out any colonic lesions would have higher benefits than                        risks prior to resuming Xarelto. Recommendation:       - Perform a colonoscopy tomorrow, if patient agreeable                       -  Perform an H. pylori serology today.                       - Clear liquid diet.                       - Discontinue Octreotide (No varices present). Ok to                        continue PPI IV BID while inpatient, and then change to                        PO twice daily on discharge for 30 days.                       - Return to GI clinic in 2 weeks. Repeat EGD for                        Barrett's biopsies and re-assessement of gastric                        erosions vs GAVE to be discussed in clinic.                       - Return to primary care physician in 2 weeks.                       - The findings and recommendations were discussed with                        the patient.                       - Continue Serial CBCs and transfuse PRN                       - Avoid NSAIDs except Aspirin if medically indicated Procedure Code(s):    --- Professional ---                       [phone], Esophagogastroduodenoscopy, flexible, transoral;                        diagnostic, including collection of specimen(s) by                        brushing or washing, when performed (separate procedure) Diagnosis Code(s):    ---  Professional ---                       K22.8, Other specified diseases of esophagus                       K31.89, Other diseases of stomach and duodenum                       D62, Acute posthemorrhagic anemia CPT copyright 2018 American Medical Association. All rights reserved. The codes documented in this report are preliminary and upon coder review may  be revised to meet current compliance requirements.  Vonda Antigua, MD Margretta Sidle B. Bonna Gains MD, MD 02/09/2018 12:49:00 PM This report has been signed electronically. Number of Addenda: 0 Note Initiated On: 02/09/2018 12:08 PM      [facility]

## 2018-02-09 NOTE — Progress Notes (Signed)
Discussed with Dr. Soyla Murphy and Hinton Dyer, NP that patient has been slightly hoarse since returning from EGD where he was intubated and that he keeps coughing and using Yankauer to suction what he's coughing up.  Patient also clears throat and coughs more when taking sips.  Dr. Soyla Murphy gave order to not give GoLYTELY due to possible aspiration.

## 2018-02-10 ENCOUNTER — Encounter
Admission: EM | Disposition: A | Discharge status: Home / Self Care | Payer: Self-pay | Source: Home / Self Care | Attending: Internal Medicine

## 2018-02-10 ENCOUNTER — Encounter: Payer: Self-pay | Admitting: Gastroenterology

## 2018-02-10 DIAGNOSIS — D649 Anemia, unspecified: Secondary | ICD-10-CM

## 2018-02-10 LAB — BPAM RBC
BLOOD PRODUCT EXPIRATION DATE: 201911262359
BLOOD PRODUCT EXPIRATION DATE: 201911262359
BLOOD PRODUCT EXPIRATION DATE: 201912042359
BLOOD PRODUCT EXPIRATION DATE: 201912082359
BLOOD PRODUCT EXPIRATION DATE: 201912222359
Blood Product Expiration Date: 201911292359
Blood Product Expiration Date: 201912062359
Blood Product Expiration Date: 201912082359
Blood Product Expiration Date: 201912082359
Blood Product Expiration Date: 201912112359
Blood Product Expiration Date: 201912112359
Blood Product Expiration Date: 201912222359
Blood Product Expiration Date: 201912222359
Blood Product Expiration Date: 201912232359
ISSUE DATE / TIME: 201911230540
ISSUE DATE / TIME: 201911230540
ISSUE DATE / TIME: 201911230435
ISSUE DATE / TIME: 201911230435
ISSUE DATE / TIME: 201911230540
ISSUE DATE / TIME: 201911230540
ISSUE DATE / TIME: 201911261306
UNIT TYPE AND RH: 5100
UNIT TYPE AND RH: 5100
UNIT TYPE AND RH: 9500
UNIT TYPE AND RH: 9500
UNIT TYPE AND RH: 9500
UNIT TYPE AND RH: 9500
UNIT TYPE AND RH: 9500
Unit Type and Rh: 5100
Unit Type and Rh: 5100
Unit Type and Rh: 5100
Unit Type and Rh: 5100
Unit Type and Rh: 9500
Unit Type and Rh: 9500
Unit Type and Rh: 9500

## 2018-02-10 LAB — TYPE AND SCREEN
ABO/RH(D): O NEG
Antibody Screen: NEGATIVE
UNIT DIVISION: 0
UNIT DIVISION: 0
UNIT DIVISION: 0
UNIT DIVISION: 0
UNIT DIVISION: 0
Unit division: 0
Unit division: 0
Unit division: 0
Unit division: 0
Unit division: 0
Unit division: 0
Unit division: 0
Unit division: 0
Unit division: 0

## 2018-02-10 LAB — COMPREHENSIVE METABOLIC PANEL
ALK PHOS: 49 U/L (ref 38–126)
ALT: 12 U/L (ref 0–44)
ANION GAP: 10 (ref 5–15)
AST: 19 U/L (ref 15–41)
Albumin: 3.2 g/dL — ABNORMAL LOW (ref 3.5–5.0)
BILIRUBIN TOTAL: 1.3 mg/dL — AB (ref 0.3–1.2)
BUN: 14 mg/dL (ref 8–23)
CALCIUM: 8.9 mg/dL (ref 8.9–10.3)
CO2: 32 mmol/L (ref 22–32)
CREATININE: 1.5 mg/dL — AB (ref 0.61–1.24)
Chloride: 104 mmol/L (ref 98–111)
GFR calc Af Amer: 54 mL/min — ABNORMAL LOW (ref >60)
GFR calc non Af Amer: 47 mL/min — ABNORMAL LOW (ref >60)
GLUCOSE: 100 mg/dL — AB (ref 70–99)
Potassium: 3.6 mmol/L (ref 3.5–5.1)
SODIUM: 146 mmol/L — AB (ref 135–145)
TOTAL PROTEIN: 6.9 g/dL (ref 6.5–8.1)

## 2018-02-10 LAB — CBC WITH DIFFERENTIAL/PLATELET
ABS IMMATURE GRANULOCYTES: 0.09 10*3/uL — AB (ref 0.00–0.07)
BASOS ABS: 0 10*3/uL (ref 0.0–0.1)
Basophils Relative: 0 %
EOS PCT: 2 %
Eosinophils Absolute: 0.2 10*3/uL (ref 0.0–0.5)
HEMATOCRIT: 35.7 % — AB (ref 39.0–52.0)
HEMOGLOBIN: 11.3 g/dL — AB (ref 13.0–17.0)
Immature Granulocytes: 1 %
LYMPHS ABS: 0.8 10*3/uL (ref 0.7–4.0)
LYMPHS PCT: 7 %
MCH: 29.7 pg (ref 26.0–34.0)
MCHC: 31.7 g/dL (ref 30.0–36.0)
MCV: 93.9 fL (ref 80.0–100.0)
Monocytes Absolute: 1.1 10*3/uL — ABNORMAL HIGH (ref 0.1–1.0)
Monocytes Relative: 9 %
NEUTROS ABS: 9.7 10*3/uL — AB (ref 1.7–7.7)
Neutrophils Relative %: 81 %
Platelets: 245 10*3/uL (ref 150–400)
RBC: 3.8 MIL/uL — AB (ref 4.22–5.81)
RDW: 14.6 % (ref 11.5–15.5)
WBC: 12.1 10*3/uL — ABNORMAL HIGH (ref 4.0–10.5)
nRBC: 0 % (ref 0.0–0.2)

## 2018-02-10 LAB — CALCIUM, IONIZED
CALCIUM, IONIZED, SERUM: 4.7 mg/dL (ref 4.5–5.6)
Calcium, Ionized, Serum: 4.6 mg/dL (ref 4.5–5.6)
Calcium, Ionized, Serum: 4.8 mg/dL (ref 4.5–5.6)
Calcium, Ionized, Serum: 4.9 mg/dL (ref 4.5–5.6)

## 2018-02-10 LAB — HEPARIN LEVEL (UNFRACTIONATED): Heparin Unfractionated: <0.1 IU/mL — ABNORMAL LOW (ref 0.30–0.70)

## 2018-02-10 LAB — C DIFFICILE QUICK SCREEN W PCR REFLEX
C Diff antigen: POSITIVE — AB
C Diff interpretation: DETECTED
C Diff toxin: POSITIVE — AB

## 2018-02-10 LAB — APTT: aPTT: 35 seconds (ref 24–36)

## 2018-02-10 LAB — HEMOGLOBIN AND HEMATOCRIT, BLOOD
HCT: 33.5 % — ABNORMAL LOW (ref 39.0–52.0)
HEMATOCRIT: 34.1 % — AB (ref 39.0–52.0)
HEMOGLOBIN: 11 g/dL — AB (ref 13.0–17.0)
Hemoglobin: 10.9 g/dL — ABNORMAL LOW (ref 13.0–17.0)

## 2018-02-10 SURGERY — COLONOSCOPY
Anesthesia: General

## 2018-02-10 MED ORDER — SODIUM CHLORIDE 0.9% FLUSH
10.0000 mL | Freq: Two times a day (BID) | INTRAVENOUS | Status: DC
  Administered 2018-02-10 – 2018-02-11 (×3): 10 mL

## 2018-02-10 MED ORDER — VANCOMYCIN 50 MG/ML ORAL SOLUTION
125.0000 mg | Freq: Four times a day (QID) | ORAL | Status: DC
  Administered 2018-02-10 – 2018-02-12 (×6): 125 mg via ORAL
  Filled 2018-02-10 (×10): qty 2.5

## 2018-02-10 MED ORDER — SODIUM CHLORIDE 0.9% FLUSH: 10.0000 mL | INTRAVENOUS | Status: DC | PRN

## 2018-02-10 MED ORDER — SODIUM CHLORIDE 0.9% FLUSH
3.0000 mL | Freq: Two times a day (BID) | INTRAVENOUS | Status: DC
  Administered 2018-02-10 – 2018-02-12 (×4): 3 mL via INTRAVENOUS

## 2018-02-10 MED ORDER — METOPROLOL SUCCINATE ER 25 MG PO TB24
25.0000 mg | ORAL_TABLET | Freq: Two times a day (BID) | ORAL | Status: DC
  Administered 2018-02-11 – 2018-02-12 (×3): 25 mg via ORAL
  Filled 2018-02-10 (×3): qty 1

## 2018-02-10 MED ORDER — CHLORHEXIDINE GLUCONATE 0.12 % MT SOLN
OROMUCOSAL | Status: AC
  Administered 2018-02-10: 15 mL via OROMUCOSAL
  Filled 2018-02-10: qty 15

## 2018-02-10 NOTE — Care Management (Signed)
Patient admitted to ICU 11/23 with hematemesis, with hemoglobin of 4.7. He was intubated and on the vent. He was on chronic Xarelto for atrial fib. Received transfusions, vitamin K platelets.  Self extubated 11/24. Currently on nasal cannula which is acute. SLP performed Bedside swallowing eval 11/26 and recommended Dysphagia 3 w Nectar thick and aspiration precautions.  If patient is able to be mobilized by staff, will need PT order. Xarelto may be resumed when renal status improves

## 2018-02-10 NOTE — Progress Notes (Signed)
Name: Nathan [name]. MRN: [id number] DOB: 05-28-1950     CONSULTATION DATE: 02/07/2018  Subjective & Objectives: [place]arden gtt. And Aspiration. Awaiting colonoscopy   PAST MEDICAL HISTORY :   has a past medical history of Atrial flutter ([place]), Chronic combined systolic and diastolic CHF (congestive heart failure) ([place]), Essential hypertension, and Pulmonary hypertension ([place]).  has a past surgical history that includes TEE without cardioversion (N/A, 05/01/2017); CARDIOVERSION (N/A, 05/01/2017); TEE without cardioversion (N/A, 06/16/2017); A-FLUTTER ABLATION (N/A, 06/16/2017); RIGHT/LEFT HEART CATH AND CORONARY ANGIOGRAPHY (N/A, 07/18/2017); Cardiac catheterization; and Coronary angioplasty. Prior to Admission medications   Medication Sig Start Date End Date Taking? Authorizing Provider  furosemide (LASIX) 40 MG tablet Take 1 tablet (40 mg total) by mouth 2 (two) times daily. 12/31/17  Yes [name], MD  losartan (COZAAR) 100 MG tablet Take 100 mg by mouth daily.   Yes [provider]  rivaroxaban (XARELTO) 20 MG TABS tablet Take 1 tablet (20 mg total) by mouth daily with supper. 07/22/17  Yes [name], MD  atorvastatin (LIPITOR) 40 MG tablet Take 1 tablet (40 mg total) by mouth daily at 6 PM. 07/22/17   Gollan, Kathlene November, MD  metoprolol succinate (TOPROL-XL) 25 MG 24 hr tablet Take 25 mg by mouth 2 (two) times daily.    [provider]  potassium chloride SA (K-DUR,KLOR-CON) 20 MEQ tablet Take 20 mEq by mouth 2 (two) times daily.    [provider]  spironolactone (ALDACTONE) 25 MG tablet Take 25 mg by mouth daily.    [provider]   No Known Allergies  FAMILY HISTORY:  family history includes Alzheimer's disease in his father and mother. SOCIAL HISTORY:  reports that he quit smoking about 9 months ago. He has never used smokeless tobacco. He reports that he drinks about 3.0 standard drinks of alcohol per week. He reports that he does not use  drugs.  REVIEW OF SYSTEMS:   Unable to obtain due to critical illness   VITAL SIGNS: Temp:  [98.4 F (36.9 C)-98.8 F (37.1 C)] 98.6 F (37 C) (11/26 0730) Pulse Rate:  [74-109] 80 (11/26 1002) Resp:  [5-28] 18 (11/26 1002) BP: (145-191)/(72-115) 169/88 (11/26 1002) SpO2:  [85 %-99 %] 93 % (11/26 1002) Weight:  [106.9 kg] 106.9 kg (11/26 0421)   Physical Examination: A &O x 3 and no focal neuro deficits [place] the [place], no distress, bilateral equal air entry with no adventitious sounds S1 & S2 are audible with no murmur Benign abdominal exam with feeble peristalsis Wasted extremities and no leg edema   ASSESSMENT / PLAN: Acute respiratory failure. self extubated on 02/09/2048 and tolerating  -Monitor work of breathing and O2 sat.  Altered mental status (improved) with toxic metabolic encephalopathy and cerebral hypoperfusion. CT head did not show acute intracranial abnormalities -Monitor neuro status  HTN -Optimize antihypertensives and monitor hemodynamics  Acute blood loss anemia with hemorrhagic shock (resolved). -Monitor hemodynamics  GI bleeding. Esophagitis with possible Barrett's esophagus -Plan per GI: d/c octreotide, c/w PPI, awaiting colonoscope and continue to hold Xarelto.  AKI (improved),hyperkalemia and intravascular volume depletion on top of CKD -Optimize hydration, temporizing agent for hyperkalemia, avoid nephrotoxins, monitor renal panel and urine output -Consider nephrology consult  Metabolic acidosis with lactic acidemia dure to tissue hypoperfusion (improved). procalcitonin 0.11 Sepsis is less likey. -D/C Bicarb gtt.  HFrEF 35 to 40%, A. fib status post DCCV. -Rate control -Watch for volume overload  Coagulopathy with Xarelto for A. Fib.S/P reversal with  Andexxa + Kcentra + vitamin K because of GI bleeding -Monitor coags -As per GI Xarelto can be resumed awaiting improvement of renal function.  Full code  DVT and GI  prophylaxis. Continue supportive care  Critical care time 40 minutes

## 2018-02-10 NOTE — Progress Notes (Signed)
Report given to Amy, RN for patient to be transferred to room 237 with two personal bags. Daughter Arnetha Massy called to notify about transfer but phone rang without voicemail option.

## 2018-02-10 NOTE — Anesthesia Postprocedure Evaluation (Signed)
Anesthesia Post Note  Patient: Nathan [name].  Procedure(s) Performed: ESOPHAGOGASTRODUODENOSCOPY (EGD) WITH PROPOFOL (N/A )  Patient location during evaluation: ICU Anesthesia Type: General Level of consciousness: awake and alert and oriented Pain management: satisfactory to patient Respiratory status: respiratory function stable Cardiovascular status: Pt on nicardipine. Anesthetic complications: no     Last Vitals:  Vitals:   02/10/18 0500 02/10/18 0600  BP: (!) 145/77 (!) 166/80  Pulse: 87 78  Resp: 12 13  Temp:    SpO2: 98% 96%    Last Pain:  Vitals:   02/10/18 0400  TempSrc: Oral  PainSc:                  Blima Singer

## 2018-02-10 NOTE — Evaluation (Signed)
Clinical/Bedside Swallow Evaluation Patient Details  Name: Nathan [name]. MRN: [id number] Date of Birth: 04-Feb-1951  Today's Date: 02/10/2018 Time: SLP Start Time (ACUTE ONLY): 42 SLP Stop Time (ACUTE ONLY): 1120 SLP Time Calculation (min) (ACUTE ONLY): 60 min  Past Medical History:  Past Medical History:  Diagnosis Date  . Atrial flutter ([place])    a. s/p TEE/DCCV 04/2017; b. CHADS2VASc => 3 (CHF, HTN, age x 1); c. not compliant with Xarelto  . Chronic combined systolic and diastolic CHF (congestive heart failure) ([place])    a. TTE 2/19: EF 20-25%, diffuse HK, mod dilated LA, mildly reduced RVSF, PASP 42; b. TTE 3/19: EF 20-25%, diffuse HK, RVSF nl, atrial flutter with RVR  . Essential hypertension   . Pulmonary hypertension ([place])    Past Surgical History:  Past Surgical History:  Procedure Laterality Date  . A-FLUTTER ABLATION N/A 06/16/2017   Procedure: A-FLUTTER ABLATION;  Surgeon: [name], MD;  Location: [facility];  Service: Cardiovascular;  Laterality: N/A;  . CARDIAC CATHETERIZATION    . CARDIOVERSION N/A 05/01/2017   Procedure: CARDIOVERSION;  Surgeon: [name], MD;  Location: [facility] ORS;  Service: Cardiovascular;  Laterality: N/A;  . CORONARY ANGIOPLASTY    . RIGHT/LEFT HEART CATH AND CORONARY ANGIOGRAPHY N/A 07/18/2017   Procedure: RIGHT/LEFT HEART CATH AND CORONARY ANGIOGRAPHY;  Surgeon: [name], MD;  Location: [facility];  Service: Cardiovascular;  Laterality: N/A;  . TEE WITHOUT CARDIOVERSION N/A 05/01/2017   Procedure: TRANSESOPHAGEAL ECHOCARDIOGRAM (TEE);  Surgeon: [name], MD;  Location: [facility] ORS;  Service: Cardiovascular;  Laterality: N/A;  . TEE WITHOUT CARDIOVERSION N/A 06/16/2017   Procedure: TRANSESOPHAGEAL ECHOCARDIOGRAM (TEE);  Surgeon: [name], MD;  Location: [facility] END[facility]COPY;  Service: Cardiovascular;  Laterality: N/A;   HPI:  Pt is a 67 y.o. male has a history of atrial fibrillation on Xarelto, CHF, CKD,  and other multiple medical issues.  In the emergency room he was admitted for hematemesis and melena.  He was orally intubated w/ NG tube for return of blood from the stomach.  He had been hypotensive hemoglobin on arrival was 4.7 g and an INR of 2.3.  He was subsequently admitted to the ICU.  The patient has been given blood, platelets, vitamin K, Kcentra, and Andexxa.  Pt self-extubated but was then re-intubated/extubated when he went for an EGD(procedure) per MD.  Pt currently presents w/ increased pharyngeal-laryngeal phlegm but is able to cough and clear it for brief periods of time.  He is alert, awake and follows commands.   Assessment / Plan / Recommendation Clinical Impression  Pt appears to present w/ potential pharyngeal phase swallowing deficits c/b inconsistent cough w/ trials of thin liquids via Cup. Pt was utilizing general aspiration precautions during the po trials, however, during episodes of increased phlegm and pharyngeal wetness, pt exhibited the coughing during those sips of thin liquids. Post trials of solid foods, and when vocal quality was clear of phlegm, no overt coughing or s/s of aspiration were noted. Unsure if pt's baseline phlegm and potential decreased pharyngeal sensation post multiple oral intubations is impacting the pharyngeal swallowing. Laryngeal excursion appeared wfl during the swallowing; no decline in O2 sats or HR/RR. W/ trials of Nectar consistency liquids, no immediate, overt s/s of aspiration were noted. Oral phase was [facility] for bolous management and oral clearing w/ all trials consistencies; timely mastication w/ solids and A-P transfer noted w/ appropriate oral clearing w/ all po's. Pt fed self  w/ min setup. OM exam appeared [facility] for strength/ROM. Recommend a Dysphagia level 3 w/ NECTAR liquids initiatlly w/ trials to upgrade to thin liquids as appropriate. Recommend aspiration precautions; Pills in Puree - Whole. NSG/MD updated.  SLP Visit Diagnosis: Dysphagia,  pharyngeal phase (R13.13)(recently intubated x2)    Aspiration Risk  Mild aspiration risk    Diet Recommendation  Dysphagia level 3 w/ NECTAR liquids; general aspiration precautions; tray setup and positioning  Medication Administration: Whole meds with puree(for safer swallowing)    Other  Recommendations Recommended Consults: (TBD) Oral Care Recommendations: Oral care BID;Staff/trained caregiver to provide oral care Other Recommendations: Order thickener from pharmacy;Prohibited food (jello, ice cream, thin soups);Remove water pitcher;Have oral suction available   Follow up Recommendations (TBD)      Frequency and Duration min 3x week  2 weeks       Prognosis Prognosis for Safe Diet Advancement: Fair(-Good) Barriers to Reach Goals: (n/a)      Swallow Study   General Date of Onset: 02/07/18 HPI: Pt is a 67 y.o. male has a history of atrial fibrillation on Xarelto, CHF, CKD, and other multiple medical issues.  In the emergency room he was admitted for hematemesis and melena.  He was orally intubated w/ NG tube for return of blood from the stomach.  He had been hypotensive hemoglobin on arrival was 4.7 g and an INR of 2.3.  He was subsequently admitted to the ICU.  The patient has been given blood, platelets, vitamin K, Kcentra, and Andexxa.  Pt self-extubated but was then re-intubated/extubated when he went for an EGD(procedure) per MD.  Pt currently presents w/ increased pharyngeal-laryngeal phlegm but is able to cough and clear it for brief periods of time.  He is alert, awake and follows commands. Type of Study: Bedside Swallow Evaluation Previous Swallow Assessment: none Diet Prior to this Study: NPO(regular diet at home per pt) Temperature Spikes Noted: No(wbc 12.1) Respiratory Status: Nasal cannula(2 liters) History of Recent Intubation: Yes Length of Intubations (days): 2 days Date extubated: 02/09/18 Behavior/Cognition: Alert;Cooperative;Pleasant  mood;Distractible;Requires cueing(min) Oral Cavity Assessment: Within Functional Limits Oral Care Completed by SLP: Recent completion by staff Oral Cavity - Dentition: Missing dentition Vision: Functional for self-feeding Self-Feeding Abilities: Able to feed self;Needs assist;Needs set up Patient Positioning: Upright in bed(needs positioning) Baseline Vocal Quality: Normal(but wet intermittently) Volitional Cough: Strong Volitional Swallow: Able to elicit    Oral/Motor/Sensory Function Overall Oral Motor/Sensory Function: Within functional limits   Ice Chips Ice chips: Within functional limits Presentation: Spoon(fed; 3 trials)   Thin Liquid Thin Liquid: Impaired Presentation: Cup;Self Fed(10 trials) Oral Phase Impairments: (none) Oral Phase Functional Implications: (none) Pharyngeal  Phase Impairments: Suspected delayed Swallow(impact from multiple intubations; phlegm)    Nectar Thick Nectar Thick Liquid: Within functional limits Presentation: Cup;Self Fed(4 trials)   Honey Thick Honey Thick Liquid: Not tested   Puree Puree: Within functional limits Presentation: Self Fed;Spoon(assisted at first; 4 ozs)   Solid     Solid: Within functional limits(mech soft foods) Presentation: Self Fed(5 trials)       Orinda Kenner, MS, CCC-SLP , 02/10/2018,11:51 AM

## 2018-02-10 NOTE — Progress Notes (Signed)
Patient ID: Nathan [name]., male   DOB: Sep 07, 1950, 67 y.o.   MRN: [id number]  [employer] PROGRESS NOTE  [name]. WUJ:[id number] DOB: [birth year] DOA: 02/07/2018 PCP: Patient, [name]  HPI/Subjective: Unable to tolerate colonoscopy prep due to difficulty swallowing. No chest pain, cough, or shortness of breath.  Objective: Vitals:   02/10/18 1751 02/10/18 1933  BP: (!) 156/78 129/74  Pulse: (!) 108 99  Resp:    Temp:  98.8 F (37.1 C)  SpO2: 98% 99%    Filed Weights   02/07/18 0824 02/08/18 0500 02/10/18 0421  Weight: 106.8 kg 113 kg 106.9 kg    ROS: Review of Systems  Constitutional: Negative for chills and fever.  Eyes: Negative for blurred vision.  Respiratory: Negative for cough and shortness of breath.   Cardiovascular: Negative for chest pain.  Gastrointestinal: Negative for abdominal pain, constipation, diarrhea, nausea and vomiting.  Genitourinary: Negative for dysuria.  Musculoskeletal: Negative for joint pain.  Neurological: Negative for dizziness and headaches.   Exam: Physical Exam  HENT:  Nose: No mucosal edema.  Mouth/Throat: No oropharyngeal exudate or posterior oropharyngeal edema.  Eyes: Pupils are equal, round, and reactive to light. Conjunctivae, EOM and lids are normal.  Neck: No JVD present. Carotid bruit is not present. No edema present. No thyroid mass and no thyromegaly present.  Cardiovascular: S1 normal and S2 normal. Exam reveals no gallop.  No murmur heard. Pulses:      Dorsalis pedis pulses are 2+ on the right side, and 2+ on the left side.  Respiratory: No respiratory distress. He has no wheezes. He has no rhonchi. He has no rales.  [place] in place  GI: Soft. Bowel sounds are normal. There is no tenderness.  Musculoskeletal:       Right ankle: He exhibits swelling.       Left ankle: He exhibits swelling.  Lymphadenopathy:    He has no cervical adenopathy.  Neurological: He is alert. No cranial nerve deficit.  Skin: Skin  is warm. No rash noted. Nails show no clubbing.  Psychiatric: He has a normal mood and affect.      Data Reviewed: Basic Metabolic Panel: Recent Labs  Lab 02/07/18 1124 02/07/18 1721  02/08/18 0333 02/08/18 1035 02/08/18 1704 02/08/18 2245 02/09/18 0527 02/10/18 0316  NA 137 137  --  141  --   --   --  147* 146*  K 6.0* 5.4*  --  3.9  --   --   --  4.0 3.6  CL 106 107  --  99  --   --   --  103 104  CO2 20* 21*  --  30  --   --   --  32 32  GLUCOSE 166* 117*  --  121*  --   --   --  118* 100*  BUN 48* 48*  --  40*  --   --   --  24* 14  CREATININE 3.60* 3.22*  --  2.48*  --   --   --  1.67* 1.50*  CALCIUM 8.4* 7.8*  --  7.3*  --   --   --  8.5* 8.9  MG 2.3 2.2   < > 1.9 2.0 2.1 2.2 2.0  --   PHOS 5.4* 5.0*   < > 3.1 3.2 3.7 2.9 2.7  --    < > = values in this interval not displayed.   Liver Function Tests: Recent Labs  Lab 02/07/18  0430 02/08/18 0333 02/09/18 0527 02/10/18 0316  AST 30 13* 17 19  ALT _0 ALKPHOS 38 38 42 49  BILITOT 0.5 0.7 0.9 1.3*  PROT 6.6 5.3* 6.1* 6.9  ALBUMIN 3.3* 2.8* 3.0* 3.2*   Recent Labs  Lab 02/07/18 0430  LIPASE 29   Recent Labs  Lab 02/07/18 0430  AMMONIA 15   CBC: Recent Labs  Lab 02/07/18 0430  02/08/18 0333  02/09/18 0527 02/09/18 2030 02/10/18 0316 02/10/18 0856 02/10/18 1554  WBC 7.2  --  7.0  --  8.8  --  12.1*  --   --   NEUTROABS 5.5  --  4.9  --  6.6  --  9.7*  --   --   HGB 4.7*   < > 8.8*   < > 9.8* 11.0* 11.3* 11.0* 10.9*  HCT 15.2*   < > 26.4*   < > 30.6* 34.4* 35.7* 33.5* 34.1*  MCV 95.0  --  89.2  --  92.2  --  93.9  --   --   PLT 316  --  212  --  215  --  245  --   --    < > = values in this interval not displayed.   Cardiac Enzymes: Recent Labs  Lab 02/07/18 0430  TROPONINI <0.03   BNP (last 3 results) Recent Labs    04/29/17 2023 06/12/17 1839 07/15/17 1456  BNP 1,558.0* 1,839.0* 4,283.0*     CBG: Recent Labs  Lab 02/07/18 0822  GLUCAP 208*    Recent Results (from  the past 240 hour(s))  MRSA PCR Screening     Status: None   Collection Time: 02/07/18  8:26 AM  Result Value Ref Range Status   MRSA by PCR NEGATIVE NEGATIVE Final    Comment:        The GeneXpert MRSA Assay (FDA approved for NASAL specimens only), is one component of a comprehensive MRSA colonization surveillance program. It is not intended to diagnose MRSA infection nor to guide or monitor treatment for MRSA infections. Performed at [facility], [address] [place], [place] [address]   C difficile quick scan w PCR reflex     Status: Abnormal   Collection Time: 02/10/18  6:56 PM  Result Value Ref Range Status   C Diff antigen POSITIVE (A) NEGATIVE Final   C Diff toxin POSITIVE (A) NEGATIVE Final   C Diff interpretation Toxin producing C. difficile detected.  Final    Comment: CRITICAL RESULT CALLED TO, READ BACK BY AND VERIFIED WITH: ALISA SCOTT 02/10/18 @ 2005  [place] Performed at [facility], [address] [place], [place] [address]      Studies: Dg Chest [facility] 1 View  Result Date: 02/09/2018 CLINICAL DATA:  Initial evaluation for acute cough. EXAM: PORTABLE CHEST 1 VIEW COMPARISON:  Prior radiograph from 02/07/2018. FINDINGS: Cardiomegaly, stable from previous. Mediastinal silhouette within normal limits. Lungs mildly hypoinflated. Mild diffuse pulmonary vascular congestion without overt pulmonary edema. No consolidative airspace opacity. No pleural effusion. No pneumothorax. No acute osseous abnormality. IMPRESSION: 1. Cardiomegaly with mild diffuse pulmonary vascular congestion without overt pulmonary edema. 2. No other active cardiopulmonary disease. Electronically Signed   By: Jeannine Boga M.D.   On: 02/09/2018 20:40    Scheduled Meds: . amLODipine  5 mg Oral Daily  . chlorhexidine gluconate (MEDLINE KIT)  15 mL Mouth Rinse BID  . hydrALAZINE  25 mg Oral Q8H  . metoprolol tartrate  5 mg Intravenous Q6H  . pantoprazole  40 mg  Intravenous Q12H  . sodium chloride flush  10-40 mL Intracatheter Q12H  . sodium chloride flush  3 mL Intravenous Q12H  . vancomycin  125 mg Oral QID   Continuous Infusions:   Assessment/Plan:  1. Acute hemorrhagic shock with GI bleed likely upper source- shock has resolved and hemoglobin stable after multiple blood transfusions.  Patient received IV vitamin K, IV trans-examic acid, and Kcentra. Endoscopy 11/25 with erosive gastropathy and Barrett's esophagus. Unable to tolerate bowel prep. Per GI, okay to restart Xarelto. 2. Acute hypoxic respiratory failure. Self extubated. Stable on 2L [place]. Wean as able. 3. Dysphagia- new this admission. SLP to see. 4. Acute kidney injury- much improved. Monitor. 5. Hyperkalemia.  Resolved. 6. History of atrial fibrillation.  Anticoagulation on hold with GI bleed. Per GI, okay to restart Xarelto. 7. Chronic systolic congestive heart failure.  Monitor closely. No signs of acute exacerbation.   Code Status:     Code Status Orders  (From admission, onward)         Start     Ordered   02/07/18 0606  Full code  Continuous     02/07/18 0605        Code Status History    Date Active Date Inactive Code Status Order ID Comments User Context   07/15/2017 1955 07/19/2017 1908 Full Code [id number]  Gorden Harms, MD Inpatient   06/16/2017 1915 06/17/2017 1850 Full Code [id number]  [name], MD Inpatient   06/15/2017 1702 06/16/2017 1915 Full Code [id number]  Daune Perch, NP Inpatient   06/12/2017 2142 06/15/2017 1523 Full Code [id number]  Demetrios Loll, MD Inpatient   04/29/2017 2333 05/01/2017 1920 Full Code [id number]  Lance Coon, MD Inpatient     Family Communication: As per critical care specialist Disposition Plan: To be determined  Consultants:  Critical care specialist  Gastroenterology  Time spent: 33 minutes  [place]

## 2018-02-10 NOTE — Progress Notes (Signed)
Nathan Antigua, MD [address], [place], [place], [place], [address] [address], [place], [place], [place], [address] Phone: [phone]  Fax: [phone]   Subjective: Patient was unable to drink colonoscopy prep yesterday due to dysphagia and has undergone swallow evaluation today.    Objective: Exam: Vital signs in last 24 hours: Vitals:   02/10/18 0800 02/10/18 0830 02/10/18 0930 02/10/18 1002  BP: (!) 148/78 (!) 149/83 (!) 149/92 (!) 169/88  Pulse: 78 85 91 80  Resp: _0 Temp:      TempSrc:      SpO2: 90% 99% (!) 87% 93%  Weight:      Height:       Weight change:   Intake/Output Summary (Last 24 hours) at 02/10/2018 1145 Last data filed at 02/10/2018 0400 Gross per 24 hour  Intake 1169.19 ml  Output 4200 ml  Net -3030.81 ml    General: No acute distress, AAO x3 Abd: Soft, NT/ND, No HSM Skin: Warm, no rashes Neck: Supple, Trachea midline   Lab Results: Lab Results  Component Value Date   WBC 12.1 (H) 02/10/2018   HGB 11.0 (L) 02/10/2018   HCT 33.5 (L) 02/10/2018   MCV 93.9 02/10/2018   PLT 245 02/10/2018   Micro Results: Recent Results (from the past 240 hour(s))  MRSA PCR Screening     Status: None   Collection Time: 02/07/18  8:26 AM  Result Value Ref Range Status   MRSA by PCR NEGATIVE NEGATIVE Final    Comment:        The GeneXpert MRSA Assay (FDA approved for NASAL specimens only), is one component of a comprehensive MRSA colonization surveillance program. It is not intended to diagnose MRSA infection nor to guide or monitor treatment for MRSA infections. Performed at [facility], [address] [place], [place] [address]    Studies/Results: Dg Chest Port 1 View  Result Date: 02/09/2018 CLINICAL DATA:  Initial evaluation for acute cough. EXAM: PORTABLE CHEST 1 VIEW COMPARISON:  Prior radiograph from 02/07/2018. FINDINGS: Cardiomegaly, stable from previous. Mediastinal silhouette within normal limits.  Lungs mildly hypoinflated. Mild diffuse pulmonary vascular congestion without overt pulmonary edema. No consolidative airspace opacity. No pleural effusion. No pneumothorax. No acute osseous abnormality. IMPRESSION: 1. Cardiomegaly with mild diffuse pulmonary vascular congestion without overt pulmonary edema. 2. No other active cardiopulmonary disease. Electronically Signed   By: Jeannine Boga M.D.   On: 02/09/2018 20:40   Medications:  Scheduled Meds: . amLODipine  5 mg Oral Daily  . chlorhexidine gluconate (MEDLINE KIT)  15 mL Mouth Rinse BID  . hydrALAZINE  25 mg Oral Q8H  . metoprolol tartrate  5 mg Intravenous Q6H  . pantoprazole  40 mg Intravenous Q12H   Continuous Infusions: . sodium chloride    . niCARDipine 3 mg/hr (02/10/18 0600)   PRN Meds:.bisacodyl, senna-docusate   Assessment: Anemia   Plan: Hemoglobin has been stable around 11 with no active GI bleeding Patient unable to tolerate colonoscopy prep yesterday due to dysphagia However, patient states during the swallow evaluation today he was able to swallow multiple items without difficulty  Patient denies any dysphagia prior to hospitalization, and states this only started after hospitalization. This is likely a result of his intubations, and self extubation during this admission, and it appears that it is already improving which is consistent with the same.  I have talked to the speech and swallow pathologist, Barnetta Chapel, and her note is pending, but she states that  patient had difficulty with thin liquids, and so she is recommending thickened liquids.  She she anticipates that since the GoLYTELY is a thin liquid, patient would likely have difficulty with that colonoscopy prep.  Therefore, to prevent aspiration, due to his transient difficulty swallowing that has occurred after intubation and self extubation during this admission, and is likely to improve over time, it is safest to do a colonoscopy when he can  safely tolerate a colonoscopy prep.  Since anemia has resolved, no further active GI bleeding, and source of anemia is likely the findings on his EGD, see report, colonoscopy is best done electively as an outpatient once dysphagia improves.  Okay to resume Xarelto from GI standpoint.  Follow-up in GI clinic in 1 to 2 weeks after discharge      LOS: 3 days   Varnita Tahiliani, MD 02/10/2018, 11:45 AM  

## 2018-02-10 NOTE — Progress Notes (Signed)
Spoke to Dr. Marcille Blanco about patient having very large loose BM.  This is his 3rd in 24hrs.  Will send down a sample to check for cdiff.    He would like to keep catheter and central line to make sure he remains stable overnight.

## 2018-02-10 NOTE — Progress Notes (Signed)
[facility]  ROUNDING NOTE   Subjective:   Patient is doing fair.  Able to tolerate liquids.  Asking about going home Serum creatinine improved to 1.50 Urine output 4200 cc  Objective:  Vital signs in last 24 hours:  Temp:  [98 F (36.7 C)-98.8 F (37.1 C)] 98.6 F (37 C) (11/26 0730) Pulse Rate:  [74-109] 80 (11/26 1002) Resp:  [5-28] 18 (11/26 1002) BP: (145-191)/(72-115) 169/88 (11/26 1002) SpO2:  [85 %-100 %] 93 % (11/26 1002) Weight:  [106.9 kg] 106.9 kg (11/26 0421)  Weight change:  Filed Weights   02/07/18 0824 02/08/18 0500 02/10/18 0421  Weight: 106.8 kg 113 kg 106.9 kg    Intake/Output: I/O last 3 completed shifts: In: 3012.1 [I.V.:3012.1] Out: 9233 [Urine:5775]   Intake/Output this shift:  No intake/output data recorded.  Physical Exam: General: Critically ill   Head:/ENT  anicteric, moist membranes  Neck:  Supple, no masses  Lungs:  Clear to auscultation,   Heart:  Irregular, atrial fibrillation  Abdomen:  Soft, +bowel sounds  Extremities: no peripheral edema.  Neurologic:  alert, oriented  Skin: No lesions  GU: Foley with yellow urine    Basic Metabolic Panel: Recent Labs  Lab 02/07/18 1124 02/07/18 1721  02/08/18 0333 02/08/18 1035 02/08/18 1704 02/08/18 2245 02/09/18 0527 02/10/18 0316  NA 137 137  --  141  --   --   --  147* 146*  K 6.0* 5.4*  --  3.9  --   --   --  4.0 3.6  CL 106 107  --  99  --   --   --  103 104  CO2 20* 21*  --  30  --   --   --  32 32  GLUCOSE 166* 117*  --  121*  --   --   --  118* 100*  BUN 48* 48*  --  40*  --   --   --  24* 14  CREATININE 3.60* 3.22*  --  2.48*  --   --   --  1.67* 1.50*  CALCIUM 8.4* 7.8*  --  7.3*  --   --   --  8.5* 8.9  MG 2.3 2.2   < > 1.9 2.0 2.1 2.2 2.0  --   PHOS 5.4* 5.0*   < > 3.1 3.2 3.7 2.9 2.7  --    < > = values in this interval not displayed.    Liver Function Tests: Recent Labs  Lab 02/07/18 0430 02/08/18 0333 02/09/18 0527 02/10/18 0316  AST 30 13*  17 19  ALT '14 11 12 12  '$ ALKPHOS 38 38 42 49  BILITOT 0.5 0.7 0.9 1.3*  PROT 6.6 5.3* 6.1* 6.9  ALBUMIN 3.3* 2.8* 3.0* 3.2*   Recent Labs  Lab 02/07/18 0430  LIPASE 29   Recent Labs  Lab 02/07/18 0430  AMMONIA 15    CBC: Recent Labs  Lab 02/07/18 0430  02/08/18 0333  02/08/18 2245 02/09/18 0527 02/09/18 2030 02/10/18 0316 02/10/18 0856  WBC 7.2  --  7.0  --   --  8.8  --  12.1*  --   NEUTROABS 5.5  --  4.9  --   --  6.6  --  9.7*  --   HGB 4.7*   < > 8.8*   < > 10.1* 9.8* 11.0* 11.3* 11.0*  HCT 15.2*   < > 26.4*   < > 30.4* 30.6* 34.4* 35.7* 33.5*  MCV  95.0  --  89.2  --   --  92.2  --  93.9  --   PLT 316  --  212  --   --  215  --  245  --    < > = values in this interval not displayed.    Cardiac Enzymes: Recent Labs  Lab 02/07/18 0430  TROPONINI <0.03    BNP: Invalid input(s): POCBNP  CBG: Recent Labs  Lab 02/07/18 0822  GLUCAP 208*    Microbiology: Results for orders placed or performed during the hospital encounter of 02/07/18  MRSA PCR Screening     Status: None   Collection Time: 02/07/18  8:26 AM  Result Value Ref Range Status   MRSA by PCR NEGATIVE NEGATIVE Final    Comment:        The GeneXpert MRSA Assay (FDA approved for NASAL specimens only), is one component of a comprehensive MRSA colonization surveillance program. It is not intended to diagnose MRSA infection nor to guide or monitor treatment for MRSA infections. Performed at [facility], [address] [place], [place] [address]     Coagulation Studies: Recent Labs    02/07/18 1124 02/07/18 1931 02/08/18 0333  LABPROT 14.1 16.7* 15.3*  INR 1.10 1.37 1.22    Urinalysis: No results for input(s): COLORURINE, LABSPEC, PHURINE, GLUCOSEU, HGBUR, BILIRUBINUR, KETONESUR, PROTEINUR, UROBILINOGEN, NITRITE, LEUKOCYTESUR in the last 72 hours.  Invalid input(s): APPERANCEUR    Imaging: Dg Chest Port 1 View  Result Date: 02/09/2018 CLINICAL DATA:  Initial  evaluation for acute cough. EXAM: PORTABLE CHEST 1 VIEW COMPARISON:  Prior radiograph from 02/07/2018. FINDINGS: Cardiomegaly, stable from previous. Mediastinal silhouette within normal limits. Lungs mildly hypoinflated. Mild diffuse pulmonary vascular congestion without overt pulmonary edema. No consolidative airspace opacity. No pleural effusion. No pneumothorax. No acute osseous abnormality. IMPRESSION: 1. Cardiomegaly with mild diffuse pulmonary vascular congestion without overt pulmonary edema. 2. No other active cardiopulmonary disease. Electronically Signed   By: Jeannine Boga M.D.   On: 02/09/2018 20:40     Medications:   . sodium chloride    . niCARDipine 3 mg/hr (02/10/18 0600)   . amLODipine  5 mg Oral Daily  . chlorhexidine gluconate (MEDLINE KIT)  15 mL Mouth Rinse BID  . hydrALAZINE  25 mg Oral Q8H  . metoprolol tartrate  5 mg Intravenous Q6H  . pantoprazole  40 mg Intravenous Q12H   bisacodyl, senna-docusate  Assessment/ Plan:  Nathan [name]. is a 67 y.o. black male with atrial fibrillation on xarelto, congestive heart failure, hypertension, pulmonary hypertension, EtOH who was admitted to [facility] on 02/07/2018   1. Acute renal failure with hyperkalemia and metabolic acidosis: on chronic kidney disease stage III. Baseline creatinine 1.2-1.4  Hyperkalemia secondary to potassium chloride, losartan, spironolactone, and acute renal failure Nonoliguric urine output  Serum creatinine has improved significantly We will sign off.  Please reconsult as necessary    LOS: 3 Nathan [name] 11/26/201911:03 AM

## 2018-02-10 NOTE — Progress Notes (Signed)
[place] for [facility] Indication: hemorrhagic shock possibly d/t xarelto   No Known Allergies  Patient Measurements: Height: '6\' 2"'$  (188 cm) Weight: 235 lb 10.8 oz (106.9 kg) IBW/kg (Calculated) : 82.2  Vital Signs: Temp: 98.4 F (36.9 C) (11/26 0000) Temp Source: Oral (11/26 0000) BP: 157/89 (11/26 0400) Pulse Rate: 94 (11/26 0400) Intake/Output from previous day: 11/25 0701 - 11/26 0700 In: 1620.4 [I.V.:1620.4] Out: 4200 [Urine:4200] Intake/Output from this shift: Total I/O In: 140.6 [I.V.:140.6] Out: 2000 [Urine:2000]  Labs: Recent Labs    02/07/18 0618  02/08/18 0333  02/08/18 1704 02/08/18 2245 02/09/18 0527 02/09/18 2030 02/10/18 0316  WBC  --   --  7.0  --   --   --  8.8  --  12.1*  HGB 7.1*   < > 8.8*   < > 10.3* 10.1* 9.8* 11.0* 11.3*  HCT 21.5*   < > 26.4*   < > 30.7* 30.4* 30.6* 34.4* 35.7*  PLT  --   --  212  --   --   --  215  --  245  APTT 54*  --   --   --   --   --   --   --  35  CREATININE  --    < > 2.48*  --   --   --  1.67*  --  1.50*  MG  --    < > 1.9   < > 2.1 2.2 2.0  --   --   PHOS  --    < > 3.1   < > 3.7 2.9 2.7  --   --   ALBUMIN  --   --  2.8*  --   --   --  3.0*  --  3.2*  PROT  --   --  5.3*  --   --   --  6.1*  --  6.9  AST  --   --  13*  --   --   --  17  --  19  ALT  --   --  11  --   --   --  12  --  12  ALKPHOS  --   --  38  --   --   --  42  --  49  BILITOT  --   --  0.7  --   --   --  0.9  --  1.3*   < > = values in this interval not displayed.   Estimated Creatinine Clearance: 63.1 mL/min (A) (by C-G formula based on SCr of 1.5 mg/dL (H)).   Microbiology: Recent Results (from the past 720 hour(s))  MRSA PCR Screening     Status: None   Collection Time: 02/07/18  8:26 AM  Result Value Ref Range Status   MRSA by PCR NEGATIVE NEGATIVE Final    Comment:        The GeneXpert MRSA Assay (FDA approved for NASAL specimens only), is one component of a comprehensive MRSA  colonization surveillance program. It is not intended to diagnose MRSA infection nor to guide or monitor treatment for MRSA infections. Performed at [facility], [address] [place],  [address]     Medical History: Past Medical History:  Diagnosis Date  . Atrial flutter ([place])    a. s/p TEE/DCCV 04/2017; b. CHADS2VASc => 3 (CHF, HTN, age x 1); c. not compliant with Xarelto  . Chronic  combined systolic and diastolic CHF (congestive heart failure) ([place])    a. TTE 2/19: EF 20-25%, diffuse HK, mod dilated LA, mildly reduced RVSF, PASP 42; b. TTE 3/19: EF 20-25%, diffuse HK, RVSF nl, atrial flutter with RVR  . Essential hypertension   . Pulmonary hypertension ([facility])     Medications:  Scheduled:  . amLODipine  5 mg Oral Daily  . chlorhexidine gluconate (MEDLINE KIT)  15 mL Mouth Rinse BID  . hydrALAZINE  25 mg Oral Q8H  . metoprolol tartrate  5 mg Intravenous Q6H  . pantoprazole  40 mg Intravenous Q12H    Assessment: Patient admitted w/ hematemesis for about a week. Patient obtunded w/ h/o afib on xarelto 20 mg daily.  Patient given IV vitamin K 10 mg, andexxa 800 mg IV x 1 followed by '8mg'$ /min x 120 minutes  Patient given Kcentra 2000 units IV x 1 (20 units/kg) on 11/26. Baseline aPTT elevated @ 54 seconds.  Goal of Therapy:  Reversal of GI bleed and stabilization for CBC.  Plan:  11/26 @ 0500 anti-Xa <0.10. Hgb 11.1, HCT: 35.7.    Pernell Dupre, PharmD, BCPS Clinical Pharmacist 02/10/2018 4:42 AM

## 2018-02-11 ENCOUNTER — Inpatient Hospital Stay: Payer: Medicare HMO

## 2018-02-11 LAB — HEMOGLOBIN AND HEMATOCRIT, BLOOD
HCT: 31.8 % — ABNORMAL LOW (ref 39.0–52.0)
HCT: 32 % — ABNORMAL LOW (ref 39.0–52.0)
HCT: 33.1 % — ABNORMAL LOW (ref 39.0–52.0)
HEMOGLOBIN: 10.2 g/dL — AB (ref 13.0–17.0)
Hemoglobin: 10.4 g/dL — ABNORMAL LOW (ref 13.0–17.0)
Hemoglobin: 10.7 g/dL — ABNORMAL LOW (ref 13.0–17.0)

## 2018-02-11 LAB — BASIC METABOLIC PANEL
Anion gap: 11 (ref 5–15)
BUN: 15 mg/dL (ref 8–23)
CO2: 30 mmol/L (ref 22–32)
Calcium: 8.7 mg/dL — ABNORMAL LOW (ref 8.9–10.3)
Chloride: 105 mmol/L (ref 98–111)
Creatinine, Ser: 1.38 mg/dL — ABNORMAL HIGH (ref 0.61–1.24)
GFR calc Af Amer: >60 mL/min (ref >60)
GFR calc non Af Amer: 53 mL/min — ABNORMAL LOW (ref >60)
Glucose, Bld: 108 mg/dL — ABNORMAL HIGH (ref 70–99)
Potassium: 3.5 mmol/L (ref 3.5–5.1)
Sodium: 146 mmol/L — ABNORMAL HIGH (ref 135–145)

## 2018-02-11 LAB — CBC
HCT: 31 % — ABNORMAL LOW (ref 39.0–52.0)
Hemoglobin: 10.1 g/dL — ABNORMAL LOW (ref 13.0–17.0)
MCH: 30.2 pg (ref 26.0–34.0)
MCHC: 32.6 g/dL (ref 30.0–36.0)
MCV: 92.8 fL (ref 80.0–100.0)
Platelets: 214 10*3/uL (ref 150–400)
RBC: 3.34 MIL/uL — ABNORMAL LOW (ref 4.22–5.81)
RDW: 14.4 % (ref 11.5–15.5)
WBC: 9.6 10*3/uL (ref 4.0–10.5)
nRBC: 0 % (ref 0.0–0.2)

## 2018-02-11 MED ORDER — VANCOMYCIN 50 MG/ML ORAL SOLUTION: 125.0000 mg | Freq: Four times a day (QID) | ORAL | 0 refills | Status: DC

## 2018-02-11 MED ORDER — VANCOMYCIN 50 MG/ML ORAL SOLUTION
125.0000 mg | Freq: Four times a day (QID) | ORAL | Status: DC
  Filled 2018-02-11: qty 2.5

## 2018-02-11 MED ORDER — RIVAROXABAN 20 MG PO TABS
20.0000 mg | ORAL_TABLET | Freq: Every day | ORAL | Status: DC
  Administered 2018-02-11: 20 mg via ORAL
  Filled 2018-02-11: qty 1

## 2018-02-11 MED FILL — Medication: Qty: 1 | Status: AC

## 2018-02-11 NOTE — Evaluation (Signed)
Objective Swallowing Evaluation: Type of Study: MBS-Modified Barium Swallow Study   Patient Details  Name: Nathan [name]. MRN: [id number] Date of Birth: 1951/02/20  Today's Date: 02/11/2018 Time: SLP Start Time (ACUTE ONLY): 1338 -SLP Stop Time (ACUTE ONLY): 1438  SLP Time Calculation (min) (ACUTE ONLY): 60 min   Past Medical History:  Past Medical History:  Diagnosis Date  . Atrial flutter ([place])    a. s/p TEE/DCCV 04/2017; b. CHADS2VASc => 3 (CHF, HTN, age x 1); c. not compliant with Xarelto  . Chronic combined systolic and diastolic CHF (congestive heart failure) ([place])    a. TTE 2/19: EF 20-25%, diffuse HK, mod dilated LA, mildly reduced RVSF, PASP 42; b. TTE 3/19: EF 20-25%, diffuse HK, RVSF nl, atrial flutter with RVR  . Essential hypertension   . Pulmonary hypertension ([place])    Past Surgical History:  Past Surgical History:  Procedure Laterality Date  . A-FLUTTER ABLATION N/A 06/16/2017   Procedure: A-FLUTTER ABLATION;  Surgeon: [name], MD;  Location: [facility];  Service: Cardiovascular;  Laterality: N/A;  . CARDIAC CATHETERIZATION    . CARDIOVERSION N/A 05/01/2017   Procedure: CARDIOVERSION;  Surgeon: [name], MD;  Location: [facility];  Service: Cardiovascular;  Laterality: N/A;  . CORONARY ANGIOPLASTY    . ESOPHAGOGASTRODUODENOSCOPY (EGD) WITH PROPOFOL N/A 02/09/2018   Procedure: ESOPHAGOGASTRODUODENOSCOPY (EGD) WITH PROPOFOL;  Surgeon: [name], MD;  Location: [facility];  Service: Endoscopy;  Laterality: N/A;  . RIGHT/LEFT HEART CATH AND CORONARY ANGIOGRAPHY N/A 07/18/2017   Procedure: RIGHT/LEFT HEART CATH AND CORONARY ANGIOGRAPHY;  Surgeon: [name], MD;  Location: [facility];  Service: Cardiovascular;  Laterality: N/A;  . TEE WITHOUT CARDIOVERSION N/A 05/01/2017   Procedure: TRANSESOPHAGEAL ECHOCARDIOGRAM (TEE);  Surgeon: [name], MD;  Location: [facility];  Service: Cardiovascular;  Laterality: N/A;  .  TEE WITHOUT CARDIOVERSION N/A 06/16/2017   Procedure: TRANSESOPHAGEAL ECHOCARDIOGRAM (TEE);  Surgeon: [name], MD;  Location: [facility];  Service: Cardiovascular;  Laterality: N/A;   HPI: Pt is a 67 y.o. male has a history of atrial fibrillation on Xarelto, CHF, CKD, and other multiple medical issues.  In the emergency room he was admitted for hematemesis and melena.  He was orally intubated w/ NG tube for return of blood from the stomach.  He had been hypotensive hemoglobin on arrival was 4.7 g and an INR of 2.3.  He was subsequently admitted to the ICU.  The patient has been given blood, platelets, vitamin K, Kcentra, and Andexxa.  Pt self-extubated but was then re-intubated/extubated when he went for an EGD(procedure) per MD.  Pt currently presents w/ increased pharyngeal-laryngeal phlegm but is able to cough and clear it for brief periods of time.  He is alert, awake and follows commands.   Subjective: the patient is alert and oriented.  hoarse/gravely vocal quality from recent intubations    Assessment / Plan / Recommendation  CHL IP CLINICAL IMPRESSIONS 02/11/2018  Clinical Impression The patient is presenting with moderate oropharyngeal dysphagia characterized by delayed pharyngeal swallow initiation, moderate pharyngeal residue, laryngeal penetration and/or aspiration during the delay with thin and nectar-thick liquids.  The patient has a strong reflexive cough which cleared aspirated material from the trachea.  The patient is at risk for aspiration of liquids and secretions.  Recommend dysphagia 3 diet with thin liquids, strict aspiration precautions.  Anticipate improved swallow function as the patient heals from the multiple intubations/sef-extubation.  SLP Visit Diagnosis Dysphagia, pharyngeal  phase (R13.13)  Attention and concentration deficit following --  Frontal lobe and executive function deficit following --  Impact on safety and function Mild aspiration risk      CHL  IP TREATMENT RECOMMENDATION 02/10/2018  Treatment Recommendations Therapy as outlined in treatment plan below     Prognosis 02/11/2018  Prognosis for Safe Diet Advancement Good  Barriers to Reach Goals --  Barriers/Prognosis Comment --    CHL IP DIET RECOMMENDATION 02/11/2018  SLP Diet Recommendations Dysphagia 3 (Mech soft) solids;Thin liquid  Liquid Administration via Cup;No straw  Medication Administration Whole meds with liquid  Compensations Minimize environmental distractions;Small sips/bites;Clear throat intermittently;Other (Comment)  Postural Changes --      CHL IP OTHER RECOMMENDATIONS 02/11/2018  Recommended Consults --  Oral Care Recommendations Oral care QID  Other Recommendations --      CHL IP FOLLOW UP RECOMMENDATIONS 02/10/2018  Follow up Recommendations (No Data)      CHL IP FREQUENCY AND DURATION 02/10/2018  Speech Therapy Frequency (ACUTE ONLY) min 3x week  Treatment Duration 2 weeks           CHL IP ORAL PHASE 02/11/2018  Oral Phase WFL  Oral - Pudding Teaspoon --  Oral - Pudding Cup --  Oral - Honey Teaspoon --  Oral - Honey Cup --  Oral - Nectar Teaspoon --  Oral - Nectar Cup --  Oral - Nectar Straw --  Oral - Thin Teaspoon --  Oral - Thin Cup --  Oral - Thin Straw --  Oral - Puree --  Oral - Mech Soft --  Oral - Regular --  Oral - Multi-Consistency --  Oral - Pill --  Oral Phase - Comment --    CHL IP PHARYNGEAL PHASE 02/11/2018  Pharyngeal Phase Impaired  Pharyngeal- Pudding Teaspoon Delayed swallow initiation-vallecula;Reduced pharyngeal peristalsis;Reduced anterior laryngeal mobility;Reduced laryngeal elevation;Pharyngeal residue - valleculae  Pharyngeal --  Pharyngeal- Pudding Cup --  Pharyngeal --  Pharyngeal- Honey Teaspoon --  Pharyngeal --  Pharyngeal- Honey Cup --  Pharyngeal --  Pharyngeal- Nectar Teaspoon --  Pharyngeal --  Pharyngeal- Nectar Cup Delayed swallow initiation-vallecula;Delayed swallow initiation-pyriform  sinuses;Reduced pharyngeal peristalsis;Reduced anterior laryngeal mobility;Reduced laryngeal elevation;Reduced airway/laryngeal closure;Penetration/Aspiration before swallow;Pharyngeal residue - valleculae;Pharyngeal residue - pyriform;Pharyngeal residue - posterior pharnyx;Moderate aspiration  Pharyngeal --  Pharyngeal- Nectar Straw --  Pharyngeal --  Pharyngeal- Thin Teaspoon --  Pharyngeal --  Pharyngeal- Thin Cup Delayed swallow initiation-pyriform sinuses;Reduced pharyngeal peristalsis;Reduced anterior laryngeal mobility;Reduced laryngeal elevation;Reduced airway/laryngeal closure;Reduced tongue base retraction;Penetration/Aspiration before swallow;Trace aspiration  Pharyngeal --  Pharyngeal- Thin Straw --  Pharyngeal --  Pharyngeal- Puree --  Pharyngeal --  Pharyngeal- Mechanical Soft --  Pharyngeal --  Pharyngeal- Regular --  Pharyngeal --  Pharyngeal- Multi-consistency --  Pharyngeal --  Pharyngeal- Pill --  Pharyngeal --  Pharyngeal Comment --     CHL IP CERVICAL ESOPHAGEAL PHASE 02/11/2018  Cervical Esophageal Phase WFL  Pudding Teaspoon --  Pudding Cup --  Honey Teaspoon --  Honey Cup --  Nectar Teaspoon --  Nectar Cup --  Nectar Straw --  Thin Teaspoon --  Thin Cup --  Thin Straw --  Puree --  Mechanical Soft --  Regular --  Multi-consistency --  Pill --  Cervical Esophageal Comment --    Leroy Sea, MS/CCC- SLP  Lajarvis Italiano, Susie 02/11/2018, 2:38 PM

## 2018-02-11 NOTE — Progress Notes (Signed)
Patient ID: Nathan Mogg., male   DOB: [birth year], 67 y.o.   MRN: [id number]  [facility] PROGRESS NOTE  Nathan [name]. JJO:[id number] DOB: 12/01/1950 DOA: 02/07/2018 PCP: Patient, [name]  HPI/Subjective: Patient had an episode of bleeding this morning. Per nursing staff, there was a large amount of bleeding in the bed with large clots. Unclear if the bleeding came from GI source or from his femoral site. Patient does not think the bleeding came from GI source. He did have two normal brown bowel movements this morning.  Objective: Vitals:   02/11/18 1008 02/11/18 1105  BP:    Pulse: 83 85  Resp:    Temp:    SpO2: 96% 97%    Filed Weights   02/08/18 0500 02/10/18 0421 02/11/18 0422  Weight: 113 kg 106.9 kg 106.1 kg    ROS: Review of Systems  Constitutional: Negative for chills and fever.  Eyes: Negative for blurred vision.  Respiratory: Negative for cough and shortness of breath.   Cardiovascular: Negative for chest pain.  Gastrointestinal: Negative for abdominal pain, constipation, diarrhea, nausea and vomiting.  Genitourinary: Negative for dysuria.  Musculoskeletal: Negative for joint pain.  Neurological: Negative for dizziness and headaches.  Endo/Heme/Allergies: Bruises/bleeds easily.   Exam: Physical Exam  HENT:  Nose: No mucosal edema.  Mouth/Throat: No oropharyngeal exudate or posterior oropharyngeal edema.  Eyes: Pupils are equal, round, and reactive to light. Conjunctivae, EOM and lids are normal.  Neck: No JVD present. Carotid bruit is not present. No edema present. No thyroid mass and no thyromegaly present.  Cardiovascular: S1 normal and S2 normal. Exam reveals no gallop.  No murmur heard. Pulses:      Dorsalis pedis pulses are 2+ on the right side, and 2+ on the left side.  Respiratory: No respiratory distress. He has no wheezes. He has no rhonchi. He has no rales.  GI: Soft. Bowel sounds are normal. There is no tenderness.  Musculoskeletal:     Right ankle: He exhibits swelling.       Left ankle: He exhibits swelling.  Lymphadenopathy:    He has no cervical adenopathy.  Neurological: He is alert. No cranial nerve deficit.  Skin: Skin is warm. No rash noted. Nails show no clubbing.  Psychiatric: He has a normal mood and affect.      Data Reviewed: Basic Metabolic Panel: Recent Labs  Lab 02/07/18 1721  02/08/18 0333 02/08/18 1035 02/08/18 1704 02/08/18 2245 02/09/18 0527 02/10/18 0316 02/11/18 0756  NA 137  --  141  --   --   --  147* 146* 146*  K 5.4*  --  3.9  --   --   --  4.0 3.6 3.5  CL 107  --  99  --   --   --  103 104 105  CO2 21*  --  30  --   --   --  32 32 30  GLUCOSE 117*  --  121*  --   --   --  118* 100* 108*  BUN 48*  --  40*  --   --   --  24* 14 15  CREATININE 3.22*  --  2.48*  --   --   --  1.67* 1.50* 1.38*  CALCIUM 7.8*  --  7.3*  --   --   --  8.5* 8.9 8.7*  MG 2.2   < > 1.9 2.0 2.1 2.2 2.0  --   --   PHOS 5.0*   < >  3.1 3.2 3.7 2.9 2.7  --   --    < > = values in this interval not displayed.   Liver Function Tests: Recent Labs  Lab 02/07/18 0430 02/08/18 0333 02/09/18 0527 02/10/18 0316  AST 30 13* 17 19  ALT '14 11 12 12  '$ ALKPHOS 38 38 42 49  BILITOT 0.5 0.7 0.9 1.3*  PROT 6.6 5.3* 6.1* 6.9  ALBUMIN 3.3* 2.8* 3.0* 3.2*   Recent Labs  Lab 02/07/18 0430  LIPASE 29   Recent Labs  Lab 02/07/18 0430  AMMONIA 15   CBC: Recent Labs  Lab 02/07/18 0430  02/08/18 0333  02/09/18 0527  02/10/18 0316 02/10/18 0856 02/10/18 1554 02/11/18 0048 02/11/18 0756 02/11/18 1204  WBC 7.2  --  7.0  --  8.8  --  12.1*  --   --   --  9.6  --   NEUTROABS 5.5  --  4.9  --  6.6  --  9.7*  --   --   --   --   --   HGB 4.7*   < > 8.8*   < > 9.8*   < > 11.3* 11.0* 10.9* 10.2* 10.1* 10.4*  HCT 15.2*   < > 26.4*   < > 30.6*   < > 35.7* 33.5* 34.1* 31.8* 31.0* 32.0*  MCV 95.0  --  89.2  --  92.2  --  93.9  --   --   --  92.8  --   PLT 316  --  212  --  215  --  245  --   --   --  214  --    <  > = values in this interval not displayed.   Cardiac Enzymes: Recent Labs  Lab 02/07/18 0430  TROPONINI <0.03   BNP (last 3 results) Recent Labs    04/29/17 2023 06/12/17 1839 07/15/17 1456  BNP 1,558.0* 1,839.0* 4,283.0*     CBG: Recent Labs  Lab 02/07/18 0822  GLUCAP 208*    Recent Results (from the past 240 hour(s))  MRSA PCR Screening     Status: None   Collection Time: 02/07/18  8:26 AM  Result Value Ref Range Status   MRSA by PCR NEGATIVE NEGATIVE Final    Comment:        The GeneXpert MRSA Assay (FDA approved for NASAL specimens only), is one component of a comprehensive MRSA colonization surveillance program. It is not intended to diagnose MRSA infection nor to guide or monitor treatment for MRSA infections. Performed at [facility], [address] [place], [place] [address]   C difficile quick scan w PCR reflex     Status: Abnormal   Collection Time: 02/10/18  6:56 PM  Result Value Ref Range Status   C Diff antigen POSITIVE (A) NEGATIVE Final   C Diff toxin POSITIVE (A) NEGATIVE Final   C Diff interpretation Toxin producing C. difficile detected.  Final    Comment: CRITICAL RESULT CALLED TO, READ BACK BY AND VERIFIED WITH: ALISA SCOTT 02/10/18 @ 2005  [place] Performed at [facility], [address] [place], [place] [address]      Studies: Dg Chest [facility] 1 View  Result Date: 02/09/2018 CLINICAL DATA:  Initial evaluation for acute cough. EXAM: PORTABLE CHEST 1 VIEW COMPARISON:  Prior radiograph from 02/07/2018. FINDINGS: Cardiomegaly, stable from previous. Mediastinal silhouette within normal limits. Lungs mildly hypoinflated. Mild diffuse pulmonary vascular congestion without overt pulmonary edema. No consolidative airspace opacity.  No pleural effusion. No pneumothorax. No acute osseous abnormality. IMPRESSION: 1. Cardiomegaly with mild diffuse pulmonary vascular congestion without overt pulmonary edema. 2. No other active  cardiopulmonary disease. Electronically Signed   By: Nathan Boga M.D.   On: 02/09/2018 20:40    Scheduled Meds: . amLODipine  5 mg Oral Daily  . chlorhexidine gluconate (MEDLINE KIT)  15 mL Mouth Rinse BID  . hydrALAZINE  25 mg Oral Q8H  . metoprolol succinate  25 mg Oral BID  . pantoprazole  40 mg Intravenous Q12H  . sodium chloride flush  10-40 mL Intracatheter Q12H  . sodium chloride flush  3 mL Intravenous Q12H  . vancomycin  125 mg Oral QID   Continuous Infusions:   Assessment/Plan:  1. GI bleed- initially with hemorrhagic shock, which has resolved after multiple blood transfusions.  Patient received IV vitamin K, IV trans-examic acid, and Kcentra. Endoscopy 11/25 with erosive gastropathy and Barrett's esophagus. Unable to tolerate bowel prep, so plan to do colonoscopy as an outpatient. GI following. Given xarelto x 1 this morning. 2. Episode of acute blood loss- occurred this morning. Unclear if bleeding came from GI source or femoral site s/p fem line removal. Hemoglobin stable, but there could be a delay in hgb drop. Will check H/H tonight and again tomorrow morning. 3. C. Diff colitis- tested positive yesterday. Continue oral vancomycin for a total 10 days. 4. Dysphagia- new this admission. SLP following. Modified barium swallow today. 5. Acute kidney injury- much improved. Monitor. 6. Hyperkalemia.  Resolved. 7. History of atrial fibrillation.  Given xarelto x 1 this morning, but will hold due to episode of bleeding today. 8. Chronic systolic congestive heart failure.  Monitor closely. No signs of acute exacerbation.   Code Status:     Code Status Orders  (From admission, onward)         Start     Ordered   02/07/18 0606  Full code  Continuous     02/07/18 0605        Code Status History    Date Active Date Inactive Code Status Order ID Comments User Context   07/15/2017 1955 07/19/2017 1908 Full Code [id number]  Gorden Harms, MD Inpatient   06/16/2017  1915 06/17/2017 1850 Full Code [id number]  [name], MD Inpatient   06/15/2017 1702 06/16/2017 1915 Full Code [id number]  Daune Perch, NP Inpatient   06/12/2017 2142 06/15/2017 1523 Full Code [id number]  Demetrios Loll, MD Inpatient   04/29/2017 2333 05/01/2017 1920 Full Code [id number]  Lance Coon, MD Inpatient     Family Communication: As per critical care specialist Disposition Plan: Possibly tomorrow  Consultants:  Critical care specialist  Gastroenterology  Time spent: 45 minutes  [facility]

## 2018-02-11 NOTE — Progress Notes (Signed)
Foley catheter removed per nurse driven protocol.  Urinal within reach at bedside  Pt instructed on need for intake/output.

## 2018-02-11 NOTE — Plan of Care (Signed)
No stools noted this shift.

## 2018-02-11 NOTE — Care Management Important Message (Signed)
Important Message  Patient Details  Name: Nathan [name]. MRN: [id number] Date of Birth: 15-Aug-1950   Medicare Important Message Given:  Yes    Elza Rafter, RN 02/11/2018, 3:07 PM

## 2018-02-11 NOTE — Progress Notes (Signed)
Called to room by patient, reports of bleeding. Femoral site assessed, post fem line removal and does not appear to be source of bleed. Large clots noticed in blood, patient VSS BP 1254/83, HR 80's, O2 96% room air. MD notified, stat H&H ordered, d/c xarelto.

## 2018-02-11 NOTE — Progress Notes (Signed)
Nathan Antigua, MD [address], [place], [place], [place], [address] [id number], [place], [place], [place], [address] Phone: [phone]  Fax: [phone]   Subjective:  Patient remains hemodynamically stable.  Reports to brown bowel movements today without any melena or hematochezia.  There is a note  in the chart by nurse Ashly " called to room by patient, reports of bleeding.  Femoral site assessed, post femoral line removal and does not appear to be source of bleed.  Large clots noted in blood."   Objective: Exam: Vital signs in last 24 hours: Vitals:   02/11/18 0504 02/11/18 0819 02/11/18 1008 02/11/18 1105  BP:  (!) 143/73    Pulse: 84 74 83 85  Resp:  19    Temp:  (!) 97.4 F (36.3 C)    TempSrc:  Oral    SpO2:  100% 96% 97%  Weight:      Height:       Weight change: -0.849 kg  Intake/Output Summary (Last 24 hours) at 02/11/2018 1116 Last data filed at 02/11/2018 1009 Gross per 24 hour  Intake 333.82 ml  Output 2050 ml  Net -1716.18 ml    General: No acute distress, AAO x3 Abd: Soft, NT/ND, No HSM Skin: Warm, no rashes Neck: Supple, Trachea midline   Lab Results: Lab Results  Component Value Date   WBC 9.6 02/11/2018   HGB 10.1 (L) 02/11/2018   HCT 31.0 (L) 02/11/2018   MCV 92.8 02/11/2018   PLT 214 02/11/2018   Micro Results: Recent Results (from the past 240 hour(s))  MRSA PCR Screening     Status: None   Collection Time: 02/07/18  8:26 AM  Result Value Ref Range Status   MRSA by PCR NEGATIVE NEGATIVE Final    Comment:        The GeneXpert MRSA Assay (FDA approved for NASAL specimens only), is one component of a comprehensive MRSA colonization surveillance program. It is not intended to diagnose MRSA infection nor to guide or monitor treatment for MRSA infections. Performed at [facility], [address] [place], [place] [address]   C difficile quick scan w PCR reflex     Status: Abnormal   Collection Time:  02/10/18  6:56 PM  Result Value Ref Range Status   C Diff antigen POSITIVE (A) NEGATIVE Final   C Diff toxin POSITIVE (A) NEGATIVE Final   C Diff interpretation Toxin producing C. difficile detected.  Final    Comment: CRITICAL RESULT CALLED TO, READ BACK BY AND VERIFIED WITH: ALISA SCOTT 02/10/18 @ 2005  [place] Performed at [facility], [address] [place], [place] [address]    Studies/Results: Dg Chest [place] 1 View  Result Date: 02/09/2018 CLINICAL DATA:  Initial evaluation for acute cough. EXAM: PORTABLE CHEST 1 VIEW COMPARISON:  Prior radiograph from 02/07/2018. FINDINGS: Cardiomegaly, stable from previous. Mediastinal silhouette within normal limits. Lungs mildly hypoinflated. Mild diffuse pulmonary vascular congestion without overt pulmonary edema. No consolidative airspace opacity. No pleural effusion. No pneumothorax. No acute osseous abnormality. IMPRESSION: 1. Cardiomegaly with mild diffuse pulmonary vascular congestion without overt pulmonary edema. 2. No other active cardiopulmonary disease. Electronically Signed   By: Jeannine Boga M.D.   On: 02/09/2018 20:40   Medications:  Scheduled Meds: . amLODipine  5 mg Oral Daily  . chlorhexidine gluconate (MEDLINE KIT)  15 mL Mouth Rinse BID  . hydrALAZINE  25 mg Oral Q8H  . metoprolol succinate  25 mg Oral BID  .  pantoprazole  40 mg Intravenous Q12H  . sodium chloride flush  10-40 mL Intracatheter Q12H  . sodium chloride flush  3 mL Intravenous Q12H  . vancomycin  125 mg Oral QID   Continuous Infusions: PRN Meds:.bisacodyl, sodium chloride flush   Assessment: Anemia   Plan: Source of anemia is likely gastric erosions, and red spot noted in his esophagus (NG tube, versus pill esophagitis)  No further drop in hemoglobin, and no further active GI bleeding is consistent with the above as well.  I talked to the patient and his nurse ASHLY in detail about the reported bleeding seen today  Patient states he  has had 2 bowel movements a day, one before the reported incident of blood seen, and one after, and both of them were brown stool without any melena or hematochezia.  Patient states the blood was noted on his right upper thigh area, at the site of an IV insertion. His nurse also states, that a femoral line was removed today, and no bleeding was seen subsequently after, but 45 minutes later the patient called her to his room, due to blood seen at the site and clots noted.  This area was cleaned and no further bleeding has been seen.  In addition his repeat hemoglobin after this incident is stable at 10.4 and has not shown a drop since previous check.  He is hemodynamically stable.  Therefore, this is consistent with the bleeding being from the femoral site and the femoral line removal and not due to active GI bleeding.  However, I have discussed this with the patient and offered that if he would like to attempt a colonoscopy prep, we can proceed with colonoscopy as an inpatient.  If he would like to proceed with colonoscopy as an inpatient this can be done on this admission, otherwise patient can follow-up with an outpatient in GI clinic.  Continue serial CBCs and transfuse PRN    LOS: 4 days   Nathan Antigua, MD 02/11/2018, 11:16 AM

## 2018-02-11 NOTE — Care Management Note (Addendum)
Case Management Note  Patient Details  Name: Nathan [name]. MRN: [id number] Date of Birth: February 02, 1951  Subjective/Objective:    Patient is from home with girlfriend.  His has been admitted with HGB of 4.7, intubated in ICU and self extubated on 11/24.  He was hoping to discharge today but had an episode of unclear bleeding either GI or from femoral site.  Will recheck HGB in am and hopefully discharge.  He is a bit agitated on RNCM assessment.  He states he has been through a lot and just wants to go home.  No current services in the home.  On room air.  Current with Dr. Rockey Situ but does not have a PCP.  Not interested at this time in [facility] setting that up.  Declines any home health services at discharge.  He denies difficulties obtaining medications or with medical care.  Obtains his prescriptions at [facility] on [address]. Does not use a walker or a cane.  Does not drive.  He gets rides from friends as needed.  Pharmacy is working on liquid Vancomycin for discharge as he cannot afford the co-pay.   No further needs identified by [facility] at this time.                 Action/Plan:   Expected Discharge Date:  02/10/18               Expected Discharge Plan:  Home/Self Care  In-House Referral:     Discharge planning Services  CM Consult  Post Acute Care Choice:    Choice offered to:     DME Arranged:    DME Agency:     HH Arranged:    HH Agency:     Status of Service:  Completed, signed off  If discussed at [employer] of Stay Meetings, dates discussed:    Additional Comments:  Elza Rafter, RN 02/11/2018, 3:25 PM

## 2018-02-12 LAB — HEMOGLOBIN AND HEMATOCRIT, BLOOD
HEMATOCRIT: 28.6 % — AB (ref 39.0–52.0)
HEMOGLOBIN: 9.1 g/dL — AB (ref 13.0–17.0)

## 2018-02-12 MED ORDER — VANCOMYCIN 50 MG/ML ORAL SOLUTION: 125.0000 mg | Freq: Four times a day (QID) | ORAL | 0 refills | Status: AC

## 2018-02-12 NOTE — Discharge Instructions (Signed)
Up with primary care physician in 3 days Follow-up with gastroenterology Dr. Bonna Gains in a week

## 2018-02-12 NOTE — Discharge Summary (Addendum)
[place] [facility] [place] NAME: Nathan [name]    MR#:  [id number]  DATE OF BIRTH:  September 26, 1950  DATE OF ADMISSION:  02/07/2018 ADMITTING PHYSICIAN: Arta Silence, MD  DATE OF DISCHARGE: 02/12/18  PRIMARY CARE PHYSICIAN: Patient, [name]    ADMISSION DIAGNOSIS:  Hemorrhagic shock ([place]) [R57.8] Hyperkalemia [E87.5] Lactic acidosis [E87.2] Upper GI bleed [K92.2] Acute kidney injury ([place]) [N17.9] Hypothermia, initial encounter [T68.XXXA]  DISCHARGE DIAGNOSIS:  Active Problems:   Hemorrhagic shock (HCC)   Hyperkalemia   Acute respiratory failure with hypoxia (HCC)   CLE (columnar lined esophagus)   Stomach irritation   Acute posthemorrhagic anemia   SECONDARY DIAGNOSIS:   Past Medical History:  Diagnosis Date  . Atrial flutter ([place])    a. s/p TEE/DCCV 04/2017; b. CHADS2VASc => 3 (CHF, HTN, age x 1); c. not compliant with Xarelto  . Chronic combined systolic and diastolic CHF (congestive heart failure) ([place])    a. TTE 2/19: EF 20-25%, diffuse HK, mod dilated LA, mildly reduced RVSF, PASP 42; b. TTE 3/19: EF 20-25%, diffuse HK, RVSF nl, atrial flutter with RVR  . Essential hypertension   . Pulmonary hypertension [facility])     HOSPITAL COURSE:   HPI  Nathan [name]  is a 67 y.o. male with a known history of AFib/AFlutter (s/p DCCV; persistent; Xarelto), chronic systolic + diastolic CHF (EF 40-08% w/ grade I diastolic dysfxn as of 67/61/9509 Echo), CKD III p/w hematemesis, melena, encephalopathy, hypotension, hemorrhagic shock, acute blood loss anemia. Pt already intubated at the time of my assessment, and cannot provide Hx/ROS. Pt reportedly w/ 1wk Hx hematemesis. Melena on rectal exam. No known Hx of GI/liver pathology. Severely hypotensive and encephalopathic, intubated on ED arrival. Hgb 4.7. INR 2.3. Massive txfn protocol initiated. Admit to ICU.  1. GI bleed- initially with hemorrhagic shock, which has resolved after multiple  blood transfusions.  Patient received IV vitamin K, IV trans-examic acid, and Kcentra. Endoscopy 11/25 with erosive gastropathy and Barrett's esophagus. Unable to tolerate bowel prep, so plan to do colonoscopy as an outpatient.  Patient reports no other episodes of bleeding overnight and hemoglobin at 9.1 which trended down from 10.1 after the episode of acute blood loss.  Plan of care discussed with on-call gastroenterology Dr. Alice Reichert, as patient is hemodynamically stable with no other episodes of bleeding okay to discharge from GI standpoint Okay to resume Xarelto as per my discussion with Dr. Alice Reichert 2. Episode of acute blood loss- occurred yesterday 02/11/2018. Unclear if bleeding came from GI source or femoral site s/p fem line removal. no other episodes of bleeding and okay to discharge patient from GI standpoint 3. C. Diff colitis- tested positive . Continue oral vancomycin for a total 10 days.  Hospital pharmacy is providing total 10-day supply of oral vancomycin.  Patient will be discharged home with p.o. liquid vancomycin 4. Dysphagia- new this admission. SLP following. Modified barium swallow today. 5. Acute kidney injury- much improved. Monitor.  Renal function is improving, resume Lasix and spironolactone.  Continue Toprol-XL and hold Cozaar for now 6. Hyperkalemia.  Resolved. 7. History of atrial fibrillation.    Rate controlled okay to resume Xarelto 8. Chronic systolic congestive heart failure.  Monitor closely. No signs of acute exacerbation.  Continue Lasix and spironolactone.  Continue beta-blocker and hold Cozaar for now  DISCHARGE CONDITIONS:  fair  CONSULTS OBTAINED:  Treatment Team:  Arta Silence, MD Jonathon Bellows, MD   PROCEDURES  None  DRUG ALLERGIES:  No Known Allergies  DISCHARGE MEDICATIONS:   Allergies as of 02/12/2018   No Known Allergies     Medication List    STOP taking these medications   losartan 100 MG tablet Commonly known as:  COZAAR      TAKE these medications   atorvastatin 40 MG tablet Commonly known as:  LIPITOR Take 1 tablet (40 mg total) by mouth daily at 6 PM.   furosemide 40 MG tablet Commonly known as:  LASIX Take 1 tablet (40 mg total) by mouth 2 (two) times daily.   metoprolol succinate 25 MG 24 hr tablet Commonly known as:  TOPROL-XL Take 25 mg by mouth 2 (two) times daily.   potassium chloride SA 20 MEQ tablet Commonly known as:  K-DUR,KLOR-CON Take 20 mEq by mouth 2 (two) times daily.   rivaroxaban 20 MG Tabs tablet Commonly known as:  XARELTO Take 1 tablet (20 mg total) by mouth daily with supper.   spironolactone 25 MG tablet Commonly known as:  ALDACTONE Take 25 mg by mouth daily.   vancomycin 50 mg/mL  oral solution Commonly known as:  VANCOCIN Take 2.5 mLs (125 mg total) by mouth every 6 (six) hours for 9 days.        DISCHARGE INSTRUCTIONS:  Up with primary care physician in 3 days Follow-up with gastroenterology Dr. Bonna Gains in a week   DIET:  Cardiac diet  DISCHARGE CONDITION:  Stable  ACTIVITY:  Activity as tolerated  OXYGEN:  Home Oxygen: No.   Oxygen Delivery: room air  DISCHARGE LOCATION:  home   If you experience worsening of your admission symptoms, develop shortness of breath, life threatening emergency, suicidal or homicidal thoughts you must seek medical attention immediately by calling 911 or calling your MD immediately  if symptoms less severe.  You Must read complete instructions/literature along with all the possible adverse reactions/side effects for all the Medicines you take and that have been prescribed to you. Take any new Medicines after you have completely understood and accpet all the possible adverse reactions/side effects.   Please note  You were cared for by a hospitalist during your hospital stay. If you have any questions about your discharge medications or the care you received while you were in the hospital after you are discharged, you  can call the unit and asked to speak with the hospitalist on call if the hospitalist that took care of you is not available. Once you are discharged, your primary care physician will handle any further medical issues. Please note that NO REFILLS for any discharge medications will be authorized once you are discharged, as it is imperative that you return to your primary care physician (or establish a relationship with a primary care physician if you do not have one) for your aftercare needs so that they can reassess your need for medications and monitor your lab values.     Today  Chief Complaint  Patient presents with  . Hematemesis   Patient is resting comfortably.  Denies any other episodes of bleeding.  3 bowel movements in the past 24 hours with no blood.  Hemoglobin trended from 10.1-9.1  ROS:  CONSTITUTIONAL: Denies fevers, chills. Denies any fatigue, weakness.  EYES: Denies blurry vision, double vision, eye pain. EARS, NOSE, THROAT: Denies tinnitus, ear pain, hearing loss. RESPIRATORY: Denies cough, wheeze, shortness of breath.  CARDIOVASCULAR: Denies chest pain, palpitations, edema.  GASTROINTESTINAL: Denies nausea, vomiting, diarrhea, abdominal pain. Denies bright red blood per rectum. GENITOURINARY: Denies  dysuria, hematuria. ENDOCRINE: Denies nocturia or thyroid problems. HEMATOLOGIC AND LYMPHATIC: Denies easy bruising or bleeding. SKIN: Denies rash or lesion. MUSCULOSKELETAL: Denies pain in neck, back, shoulder, knees, hips or arthritic symptoms.  NEUROLOGIC: Denies paralysis, paresthesias.  PSYCHIATRIC: Denies anxiety or depressive symptoms.   VITAL SIGNS:  Blood pressure (!) 116/57, pulse 74, temperature 98.1 F (36.7 C), temperature source Oral, resp. rate 18, height 6\' 2"  (1.88 m), weight 105.5 kg, SpO2 98 %.  I/O:    Intake/Output Summary (Last 24 hours) at 02/12/2018 1143 Last data filed at 02/12/2018 0654 Gross per 24 hour  Intake 3 ml  Output 450 ml  Net  -447 ml    PHYSICAL EXAMINATION:  GENERAL:  67 y.o.-year-old patient lying in the bed with no acute distress.  EYES: Pupils equal, round, reactive to light and accommodation. No scleral icterus. Extraocular muscles intact.  HEENT: Head atraumatic, normocephalic. Oropharynx and nasopharynx clear.  NECK:  Supple, no jugular venous distention. No thyroid enlargement, no tenderness.  LUNGS: Normal breath sounds bilaterally, no wheezing, rales,rhonchi or crepitation. No use of accessory muscles of respiration.  CARDIOVASCULAR: S1, S2 normal. No murmurs, rubs, or gallops.  ABDOMEN: Soft, non-tender, non-distended. Bowel sounds present. No organomegaly or mass.  EXTREMITIES: No pedal edema, cyanosis, or clubbing.  NEUROLOGIC: Cranial nerves II through XII are intact. Muscle strength 5/5 in all extremities. Sensation intact. Gait not checked.  PSYCHIATRIC: The patient is alert and oriented x 3.  SKIN: No obvious rash, lesion, or ulcer.   DATA REVIEW:   CBC Recent Labs  Lab 02/11/18 0756  02/12/18 0255  WBC 9.6  --   --   HGB 10.1*   < > 9.1*  HCT 31.0*   < > 28.6*  PLT 214  --   --    < > = values in this interval not displayed.    Chemistries  Recent Labs  Lab 02/09/18 0527 02/10/18 0316 02/11/18 0756  NA 147* 146* 146*  K 4.0 3.6 3.5  CL 103 104 105  CO2 32 32 30  GLUCOSE 118* 100* 108*  BUN 24* 14 15  CREATININE 1.67* 1.50* 1.38*  CALCIUM 8.5* 8.9 8.7*  MG 2.0  --   --   AST 17 19  --   ALT 12 12  --   ALKPHOS 42 49  --   BILITOT 0.9 1.3*  --     Cardiac Enzymes Recent Labs  Lab 02/07/18 0430  TROPONINI <0.03    Microbiology Results  Results for orders placed or performed during the hospital encounter of 02/07/18  MRSA PCR Screening     Status: None   Collection Time: 02/07/18  8:26 AM  Result Value Ref Range Status   MRSA by PCR NEGATIVE NEGATIVE Final    Comment:        The GeneXpert MRSA Assay (FDA approved for NASAL specimens only), is one component  of a comprehensive MRSA colonization surveillance program. It is not intended to diagnose MRSA infection nor to guide or monitor treatment for MRSA infections. Performed at [facility], [address] [place], [place] [address]   C difficile quick scan w PCR reflex     Status: Abnormal   Collection Time: 02/10/18  6:56 PM  Result Value Ref Range Status   C Diff antigen POSITIVE (A) NEGATIVE Final   C Diff toxin POSITIVE (A) NEGATIVE Final   C Diff interpretation Toxin producing C. difficile detected.  Final    Comment: CRITICAL RESULT  CALLED TO, READ BACK BY AND VERIFIED WITH: ALISA SCOTT 02/10/18 @ 2005  [place] Performed at [facility], [address] [place], [place] [address]     RADIOLOGY:  Dg Chest Port 1 View  Result Date: 02/09/2018 CLINICAL DATA:  Initial evaluation for acute cough. EXAM: PORTABLE CHEST 1 VIEW COMPARISON:  Prior radiograph from 02/07/2018. FINDINGS: Cardiomegaly, stable from previous. Mediastinal silhouette within normal limits. Lungs mildly hypoinflated. Mild diffuse pulmonary vascular congestion without overt pulmonary edema. No consolidative airspace opacity. No pleural effusion. No pneumothorax. No acute osseous abnormality. IMPRESSION: 1. Cardiomegaly with mild diffuse pulmonary vascular congestion without overt pulmonary edema. 2. No other active cardiopulmonary disease. Electronically Signed   By: Jeannine Boga M.D.   On: 02/09/2018 20:40    EKG:   Orders placed or performed during the hospital encounter of 02/07/18  . ED EKG  . ED EKG  . EKG 12-Lead  . EKG 12-Lead      Management plans discussed with the patient, family and they are in agreement.  CODE STATUS:     Code Status Orders  (From admission, onward)         Start     Ordered   02/07/18 0606  Full code  Continuous     02/07/18 0605        Code Status History    Date Active Date Inactive Code Status Order ID Comments User Context   07/15/2017  1955 07/19/2017 1908 Full Code [id number]  Gorden Harms, MD Inpatient   06/16/2017 1915 06/17/2017 1850 Full Code [id number]  [name], MD Inpatient   06/15/2017 1702 06/16/2017 1915 Full Code [id number]  Daune Perch, NP Inpatient   06/12/2017 2142 06/15/2017 1523 Full Code [id number]  Demetrios Loll, MD Inpatient   04/29/2017 2333 05/01/2017 1920 Full Code [id number]  Lance Coon, MD Inpatient      TOTAL TIME TAKING CARE OF THIS PATIENT: 45  minutes.   Note: This dictation was prepared with Dragon dictation along with smaller phrase technology. Any transcriptional errors that result from this process are unintentional.   @[facility] @  on 02/12/2018 at 11:43 AM  Between 7am to 6pm - Pager - [phone]  After 6pm go to [email or web address] - password [email or web address] [facility]  Office  [phone]  CC: Primary care physician; Patient, [name]

## 2018-02-26 ENCOUNTER — Telehealth: Payer: Self-pay | Admitting: Gastroenterology

## 2018-02-26 NOTE — Telephone Encounter (Signed)
Left vm for pt to call office and schedule F/u with Dr. Vicente Males Per Dr. Darene Lamer note

## 2018-02-26 NOTE — Telephone Encounter (Signed)
-----   Message from [name], MD sent at 02/25/2018  2:41 PM EST -----  Please set up clinic appointment with me or Dr. Vicente Males in 1-2 months

## 2018-03-02 NOTE — Progress Notes (Signed)
Patient ID: Nathan Kendrix., male    DOB: 1950-03-30, 67 y.o.   MRN: [id number]  HPI  Nathan [name] is a 67 y/o male with a history of HTN, current tobacco use and chronic heart failure.   Echo report from 09/04/17 reviewed and showed an EF of 35-40%. Echo report from 06/16/17 reviewed and showed an EF of 15-20% along with mod-severe TR and an elevated PA pressure of 45 mm Hg.  Cardiac catheterization done 07/18/17 and showed heavily calcified coronary arteries with mild nonobstructive disease.  Left dominant system. Right heart catheterization showed moderately to severely elevated filling pressures, severe pulmonary hypertension and severely reduced cardiac output.   Admitted 02/07/18 due to GI bleed- initially with hemorrhagicshock. GI and nephrology consults obtained. Received multiple blood transfusions along with IV vitamin K. Endoscopy 02/09/18 showed erosive gastropathy and Barrett's esophagus. Tested positive for C. Diff so to have 10 days of antibiotics. Discharged after 5 days.    He presents today with a chief complaint of a follow-up visit. Currently without any symptoms and denies any difficulty sleeping, abdominal distention, palpitations, pedal edema, chest pain, shortness of breath, dizziness, fatigue or weight gain. Has stopped his xarelto on his own due to bleeding events.   Past Medical History:  Diagnosis Date  . Atrial flutter ([place])    a. s/p TEE/DCCV 04/2017; b. CHADS2VASc => 3 (CHF, HTN, age x 1); c. not compliant with Xarelto  . Chronic combined systolic and diastolic CHF (congestive heart failure) ([place])    a. TTE 2/19: EF 20-25%, diffuse HK, mod dilated LA, mildly reduced RVSF, PASP 42; b. TTE 3/19: EF 20-25%, diffuse HK, RVSF nl, atrial flutter with RVR  . Essential hypertension   . Pulmonary hypertension ([place])    Past Surgical History:  Procedure Laterality Date  . A-FLUTTER ABLATION N/A 06/16/2017   Procedure: A-FLUTTER ABLATION;  Surgeon: [name], MD;  Location: [facility];  Service: Cardiovascular;  Laterality: N/A;  . CARDIAC CATHETERIZATION    . CARDIOVERSION N/A 05/01/2017   Procedure: CARDIOVERSION;  Surgeon: [name], MD;  Location: [facility] ORS;  Service: Cardiovascular;  Laterality: N/A;  . CORONARY ANGIOPLASTY    . ESOPHAGOGASTRODUODENOSCOPY (EGD) WITH PROPOFOL N/A 02/09/2018   Procedure: ESOPHAGOGASTRODUODENOSCOPY (EGD) WITH PROPOFOL;  Surgeon: [name], MD;  Location: [facility] ENDOSCOPY;  Service: Endoscopy;  Laterality: N/A;  . RIGHT/LEFT HEART CATH AND CORONARY ANGIOGRAPHY N/A 07/18/2017   Procedure: RIGHT/LEFT HEART CATH AND CORONARY ANGIOGRAPHY;  Surgeon: [name], MD;  Location: [facility];  Service: Cardiovascular;  Laterality: N/A;  . TEE WITHOUT CARDIOVERSION N/A 05/01/2017   Procedure: TRANSESOPHAGEAL ECHOCARDIOGRAM (TEE);  Surgeon: [name], MD;  Location: [facility] ORS;  Service: Cardiovascular;  Laterality: N/A;  . TEE WITHOUT CARDIOVERSION N/A 06/16/2017   Procedure: TRANSESOPHAGEAL ECHOCARDIOGRAM (TEE);  Surgeon: [name], MD;  Location: [facility] ENDOSCOPY;  Service: Cardiovascular;  Laterality: N/A;   Family History  Problem Relation Age of Onset  . Alzheimer's disease Mother   . Alzheimer's disease Father    Social History   Tobacco Use  . Smoking status: Former Smoker    Last attempt to quit: 04/21/2017    Years since quitting: 0.8  . Smokeless tobacco: Never Used  Substance Use Topics  . Alcohol use: Yes    Alcohol/week: 3.0 standard drinks    Types: 3 Cans of beer per week    Frequency: Never    Comment: Drink a half of a 40oz  beer every day, drank heavily in the past   No Known Allergies  Prior to Admission medications   Medication Sig Start Date End Date Taking? Authorizing Provider  furosemide (LASIX) 40 MG tablet Take 1 tablet (40 mg total) by mouth 2 (two) times daily. 12/31/17  Yes [name], MD  metoprolol succinate (TOPROL-XL) 25 MG 24 hr tablet Take 25  mg by mouth 2 (two) times daily.   Yes [provider]  potassium chloride SA (K-DUR,KLOR-CON) 20 MEQ tablet Take 20 mEq by mouth 2 (two) times daily.   Yes [provider]  spironolactone (ALDACTONE) 25 MG tablet Take 25 mg by mouth daily.   Yes [provider]  atorvastatin (LIPITOR) 40 MG tablet Take 1 tablet (40 mg total) by mouth daily at 6 PM. Patient not taking: Reported on 03/03/2018 07/22/17   [name], MD  rivaroxaban (XARELTO) 20 MG TABS tablet Take 1 tablet (20 mg total) by mouth daily with supper. Patient not taking: Reported on 03/03/2018 07/22/17   [name], MD     Review of Systems  Constitutional: Negative for appetite change and fatigue.  HENT: Negative for congestion, postnasal drip and sore throat.   Eyes: Negative.   Respiratory: Negative for chest tightness and shortness of breath.   Cardiovascular: Negative for chest pain, palpitations and leg swelling.  Gastrointestinal: Negative for abdominal distention and abdominal pain.  Endocrine: Negative.   Genitourinary: Negative.   Musculoskeletal: Negative for back pain and neck pain.  Skin: Negative.   Allergic/Immunologic: Negative.   Neurological: Negative for dizziness and light-headedness.  Hematological: Negative for adenopathy. Does not bruise/bleed easily.  Psychiatric/Behavioral: Negative for dysphoric mood and sleep disturbance (sleeping on 2 pillows). The patient is not nervous/anxious.    Vitals:   03/03/18 0912  BP: (!) 152/72  Pulse: 65  Resp: 18  SpO2: 100%  Weight: 231 lb 4 oz (104.9 kg)  Height: 6\' 5"  (1.956 m)   Wt Readings from Last 3 Encounters:  03/03/18 231 lb 4 oz (104.9 kg)  02/12/18 232 lb 8 oz (105.5 kg)  11/04/17 236 lb 8 oz (107.3 kg)   Lab Results  Component Value Date   CREATININE 1.38 (H) 02/11/2018   CREATININE 1.50 (H) 02/10/2018   CREATININE 1.67 (H) 02/09/2018    Physical Exam  Constitutional: He is oriented to person, place,  and time. He appears well-developed and well-nourished.  HENT:  Head: Normocephalic and atraumatic.  Neck: Normal range of motion. Neck supple. No JVD present.  Cardiovascular: Normal rate. An irregular rhythm present.  Pulmonary/Chest: Effort normal. No respiratory distress. He has no wheezes. He has no rales.  Abdominal: Soft. He exhibits no distension. There is no abdominal tenderness.  Musculoskeletal:        General: No tenderness or edema.  Neurological: He is alert and oriented to person, place, and time.  Skin: Skin is warm and dry.  Psychiatric: He has a normal mood and affect. His behavior is normal. Thought content normal.  Nursing note and vitals reviewed.   Assessment & Plan:  1: Chronic heart failure with reduced ejection fraction- - NYHA class I - euvolemic today - weighing daily; Reviwed the importance of calling for any overnight weight gain of >2 pounds or a weekly weight gain of >5 pounds - weight down 5 pounds from last visit 4 months ago - not adding salt to his food and tries to eat low sodium foods. Reviewed the importance of closely following a  2000mg  sodium diet  - continues to work 3 days a week at [employer] but says that he usually eats a salad or tomato sandwich - saw cardiology Rockey Situ) 08/26/17 - since currently without HF symptoms, will not switch losartan to entresto - BNP on 07/15/17 was 4283.0 - PharmD reconciled medications with the patient  2: HTN- - BP mildly elevated today but he has been off losartan due to prior renal issues - will resume at 50mg  daily; instructed to get a pill cutter and cut the pill lengthwise, if unable to get a clean cut, will need to send in RX for the 50mg  dose - will check labs at next visit - BMP from 02/11/18 reviewed and showed sodium 146, potassium 3.5, creatinine 1.38 and GFR >60  3: Atrial flutter- - had an ablation done 06/16/17 - has stopped xarelto on his own due to bleeding events but has not informed  cardiology; thinks it's been since ~ Thanksgiving that he's been off of it  - he says that he has to go to cardiology today and he will inform them of this; at the time of this note, I see there is a telephone encounter with cardiology informing them of stopping the xarelto  Medication bottles were reviewed.  Return in 1 month or sooner for any questions/problems before then.

## 2018-03-03 ENCOUNTER — Encounter: Payer: Self-pay | Admitting: Family

## 2018-03-03 ENCOUNTER — Ambulatory Visit: Payer: Medicare HMO | Attending: Family | Admitting: Family

## 2018-03-03 ENCOUNTER — Telehealth: Payer: Self-pay | Admitting: Cardiovascular Disease

## 2018-03-03 VITALS — BP 152/72 | HR 65 | Resp 18 | Ht 77.0 in | Wt 231.2 lb

## 2018-03-03 DIAGNOSIS — I5022 Chronic systolic (congestive) heart failure: Secondary | ICD-10-CM | POA: Diagnosis not present

## 2018-03-03 DIAGNOSIS — I4892 Unspecified atrial flutter: Secondary | ICD-10-CM | POA: Insufficient documentation

## 2018-03-03 DIAGNOSIS — Z79899 Other long term (current) drug therapy: Secondary | ICD-10-CM | POA: Insufficient documentation

## 2018-03-03 DIAGNOSIS — Z7901 Long term (current) use of anticoagulants: Secondary | ICD-10-CM | POA: Diagnosis not present

## 2018-03-03 DIAGNOSIS — I110 Hypertensive heart disease with heart failure: Secondary | ICD-10-CM | POA: Diagnosis not present

## 2018-03-03 DIAGNOSIS — Z9889 Other specified postprocedural states: Secondary | ICD-10-CM | POA: Diagnosis not present

## 2018-03-03 DIAGNOSIS — I10 Essential (primary) hypertension: Secondary | ICD-10-CM

## 2018-03-03 DIAGNOSIS — Z87891 Personal history of nicotine dependence: Secondary | ICD-10-CM | POA: Insufficient documentation

## 2018-03-03 DIAGNOSIS — I272 Pulmonary hypertension, unspecified: Secondary | ICD-10-CM | POA: Insufficient documentation

## 2018-03-03 NOTE — Progress Notes (Signed)
[facility] COUNSELING NOTE   ASSESSMENT   [name] is a 12 yom who presents to the heart failure clinic after a recent admission for bleeding.   Adherence assessment   Patient is using habit as an adherence strategy.   Do you ever forget to take your medication? [] Yes (1) [x] No (0)  Do you ever skip doses due to side effects? [] Yes (1) [x] No (0)  Do you have trouble affording your medicines? [] Yes (1) [x] No (0)  Are you ever unable to pick up your medication due to transportation difficulties? [x] Yes (1) [] No (0)  Do you ever stop taking your medications because you don't believe they are helping? [] Yes (1) [x] No (0)  Total score _1______      Guideline-Directed Medical Therapy/Evidence Based Medicine   ACE/ARB/ARNI: losartan held d/t AKI from GIB   Beta Blocker: Yes   Aldosterone Antagonist: Yes Diuretic: Yes   Drug-related Problem 1: Additional therapy needed. Patient has been holding losartan due to recent AKI.    Drug-related Problem 2: Adherence. Patient has some transportation trouble and was recently set up with the ACT team.   Drug-related Problem 3: Additional therapy needed. Xarelto stopped d/t GIB   PLAN   DRP1: Recommend resuming ARB as AKI likely related to GIB.    DRP2: Printed out [employer] Delivery Form for patient. Pointed out phone number to call if he has trouble. He would like to get his medicines delivered to him instead of arranging transportation to pharmacy.   DRP3: Follow up with GI/cardiology regarding anticoagulant.    SUBJECTIVE   HPI: Nathan [name] is a 47 yom who is hesitant to resume medications after a recent episode of bleeding. His weights have been stable and he reports good adherence to medication.    Past Medical History:  Diagnosis Date  . Atrial flutter ([place])    a. s/p TEE/DCCV 04/2017; b. CHADS2VASc => 3 (CHF, HTN, age x 1); c. not compliant with Xarelto  . Chronic combined  systolic and diastolic CHF (congestive heart failure) ([place])    a. TTE 2/19: EF 20-25%, diffuse HK, mod dilated LA, mildly reduced RVSF, PASP 42; b. TTE 3/19: EF 20-25%, diffuse HK, RVSF nl, atrial flutter with RVR  . Essential hypertension   . Pulmonary hypertension ([facility])       Current Outpatient Medications:  .  furosemide (LASIX) 40 MG tablet, Take 1 tablet (40 mg total) by mouth 2 (two) times daily., Disp: 180 tablet, Rfl: 2 .  metoprolol succinate (TOPROL-XL) 25 MG 24 hr tablet, Take 25 mg by mouth 2 (two) times daily., Disp: , Rfl:  .  potassium chloride SA (K-DUR,KLOR-CON) 20 MEQ tablet, Take 20 mEq by mouth 2 (two) times daily., Disp: , Rfl:  .  spironolactone (ALDACTONE) 25 MG tablet, Take 25 mg by mouth daily., Disp: , Rfl:  .  atorvastatin (LIPITOR) 40 MG tablet, Take 1 tablet (40 mg total) by mouth daily at 6 PM. (Patient not taking: Reported on 03/03/2018), Disp: 180 tablet, Rfl: 3 .  rivaroxaban (XARELTO) 20 MG TABS tablet, Take 1 tablet (20 mg total) by mouth daily with supper. (Patient not taking: Reported on 03/03/2018), Disp: 30 tablet, Rfl: 6    OBJECTIVE    BMP Latest Ref Rng & Units 02/11/2018 02/10/2018 02/09/2018  Glucose 70 - 99 mg/dL 108(H) 100(H) 118(H)  BUN 8 - 23 mg/dL 15 14 24(H)  Creatinine 0.61 - 1.24 mg/dL 1.38(H) 1.50(H) 1.67(H)  Sodium  135 - 145 mmol/L 146(H) 146(H) 147(H)  Potassium 3.5 - 5.1 mmol/L 3.5 3.6 4.0  Chloride 98 - 111 mmol/L 105 104 103  CO2 22 - 32 mmol/L 30 32 32  Calcium 8.9 - 10.3 mg/dL 8.7(L) 8.9 8.5(L)    Vital signs: HR 65, BP 152/72, weight 231.4 lb  ECHO: Date 09/04/2017, EF 35 to 40%, diffuse hypokinesis  Cath: Date 08/22/6193, systolic dysfunction and heavy calcification   DRUGS TO AVOID IN HEART FAILURE  Drug or Class Mechanism  Analgesics . NSAIDs . COX-2 inhibitors . Glucocorticoids  Sodium and water retention, increased systemic vascular resistance, decreased response to diuretics   Diabetes  Medications . Metformin . Thiazolidinediones o Rosiglitazone (Avandia) o Pioglitazone (Actos) . DPP4 Inhibitors o Saxagliptin (Onglyza) o Sitagliptin (Januvia)   Lactic acidosis Possible calcium channel blockade   Unknown  Antiarrhythmics . Class I  o Flecainide o Disopyramide . Class III o Sotalol . Other o Dronedarone  Negative inotrope, proarrhythmic   Proarrhythmic, beta blockade  Negative inotrope  Antihypertensives . Alpha Blockers o Doxazosin . Calcium Channel Blockers o Diltiazem o Verapamil o Nifedipine . Central Alpha Adrenergics o Moxonidine . Peripheral Vasodilators o Minoxidil  Increases renin and aldosterone  Negative inotrope    Possible sympathetic withdrawal  Unknown  Anti-infective . Itraconazole . Amphotericin B  Negative inotrope Unknown  Hematologic . Anagrelide . Cilostazol   Possible inhibition of PD IV Inhibition of PD III causing arrhythmias  Neurologic/Psychiatric . Stimulants . Anti-Seizure Drugs o Carbamazepine o Pregabalin . Antidepressants o Tricyclics o Citalopram . Parkinsons o Bromocriptine o Pergolide o Pramipexole . Antipsychotics o Clozapine . Antimigraine o Ergotamine o Methysergide . Appetite suppressants . Bipolar o Lithium  Peripheral alpha and beta agonist activity  Negative inotrope and chronotrope Calcium channel blockade  Negative inotrope, proarrhythmic Dose-dependent QT prolongation  Excessive serotonin activity/valvular damage Excessive serotonin activity/valvular damage Unknown  IgE mediated hypersensitivy, calcium channel blockade  Excessive serotonin activity/valvular damage Excessive serotonin activity/valvular damage Valvular damage  Direct myofibrillar degeneration, adrenergic stimulation  Antimalarials . Chloroquine . Hydroxychloroquine Intracellular inhibition of lysosomal enzymes  Urologic Agents . Alpha  Blockers o Doxazosin o Prazosin o Tamsulosin o Terazosin  Increased renin and aldosterone  Adapted from Page RL, et al. "Drugs That May Cause or Exacerbate Heart Failure: A Scientific Statement from the [place]." Circulation 2016; 093:O67-T24. DOI: 10.1161/CIR.0000000000000426   COUNSELING POINTS/CLINICAL PEARLS  Metoprolol Succinate (Goal: 200 mg once daily)  Warn patient to avoid activities requiring mental alertness or coordination until drug effects are realized, as drug may cause dizziness. Tell patient planning major surgery with anesthesia to alert physician that drug is being used, as drug impairs ability of heart to respond to reflex adrenergic stimuli. Drug may cause diarrhea, fatigue, headache, or depression. Advise diabetic patient to carefully monitor blood glucose as drug may mask symptoms of hypoglycemia. Patient should take extended-release tablet with or immediately following meals. Counsel patient against sudden discontinuation of drug, as this may precipitate hypertension, angina, or myocardial infarction. In the event of a missed dose, counsel patient to skip the missed dose and maintain a regular dosing schedule.  Losartan (Goal: 150 mg once daily)  Warn male patient to avoid pregnancy and to report a pregnancy that occurs during therapy.  Side effects may include dizziness, upper respiratory infection, nasal congestion, and back pain.  Warn patient to avoid use of potassium supplements or potassium-containing salt substitutes unless they consult healthcare provider.  Furosemide  Drug causes sun-sensitivity. Advise patient  to use sunscreen and avoid tanning beds. Patient should avoid activities requiring coordination until drug effects are realized, as drug may cause dizziness, vertigo, or blurred vision. This drug may cause hyperglycemia, hyperuricemia, constipation, diarrhea, loss of appetite, nausea, vomiting, purpuric disorder, cramps,  spasticity, asthenia, headache, paresthesia, or scaling eczema. Instruct patient to report unusual bleeding/bruising or signs/symptoms of hypotension, infection, pancreatitis, or ototoxicity (tinnitus, hearing impairment). Advise patient to report signs/symptoms of a severe skin reactions (flu-like symptoms, spreading red rash, or skin/mucous membrane blistering) or erythema multiforme. Instruct patient to eat high-potassium foods during drug therapy, as directed by healthcare professional.  Patient should not drink alcohol while taking this drug.  Spironolactone  Warn patient to report dehydration, hypotension, or symptoms of worsening renal function.  Counsel male patient to report gynecomastia.  Side effects may include diarrhea, nausea, vomiting, abdominal cramping, fever, leg cramps, lethargy, mental confusion, decreased libido, irregular menses, and rash. Suspension: Tell patient to take drug consistently with respect to food, either before or after a meal.  Advise patient to avoid potassium supplements and foods containing high levels of potassium, including salt substitutes.  MEDICATION ADHERENCES TIPS AND STRATEGIES 1. Taking medication as prescribed improves patient outcomes in heart failure (reduces hospitalizations, improves symptoms, increases survival) 2. Side effects of medications can be managed by decreasing doses, switching agents, stopping drugs, or adding additional therapy. Please let someone in the [facility] know if you have having bothersome side effects so we can modify your regimen. Do not alter your medication regimen without talking to [place].  3. Medication reminders can help patients remember to take drugs on time. If you are missing or forgetting doses you can try linking behaviors, using pill boxes, or an electronic reminder like an alarm on your phone or an app. Some people can also get automated phone calls as medication reminders.   Time spent: 10  minutes  Laural Benes, Pharm.D. Clinical Pharmacist 03/03/2018

## 2018-03-03 NOTE — Telephone Encounter (Signed)
Spoke with patient. Recently, in hospital for GI bleed. He was told to resume Xarelto at discharge on 02/11/18 but patient has not yet. He says he's afraid to do so and wants to make sure Dr Rockey Situ wants him to do so. Advised patient that it is ok to resume per discharge summary. However, patient is still hesitant. Advised him I will route to Dr Rockey Situ to further review.

## 2018-03-03 NOTE — Telephone Encounter (Signed)
Pt c/o medication issue:  1. Name of Medication: xarelto   2. How are you currently taking this medication (dosage and times per day)? dc  3. Are you having a reaction (difficulty breathing--STAT)? Bleeding   4. What is your medication issue? Patient reports recent visit to hospital for internal bleeding and has since stopped taking xarelto

## 2018-03-03 NOTE — Patient Instructions (Addendum)
Continue weighing daily and call for an overnight weight gain of > 2 pounds or a weekly weight gain of >5 pounds.  Get a pill cutter at the pharmacy.  Cut your losartan pill in half length-wise and take 1/2 tablet daily.

## 2018-03-07 NOTE — Telephone Encounter (Signed)
It is up to him whether he feels comfortable restarting anticoagulation He was cleared to start by GI in the hospital The [facility] doctor also wanted him to follow up in 2 weeks following discharge as detailed in EGD report He should talk with them if he is concerned.

## 2018-03-09 NOTE — Telephone Encounter (Signed)
LMTCB 12/23

## 2018-03-10 NOTE — Telephone Encounter (Signed)
I left a message for the patient to call back 

## 2018-03-12 ENCOUNTER — Encounter: Payer: Self-pay | Admitting: Gastroenterology

## 2018-03-12 ENCOUNTER — Telehealth: Payer: Self-pay | Admitting: Gastroenterology

## 2018-03-12 NOTE — Telephone Encounter (Signed)
Left  vm for pt to call office and schedule apt with Dr. Vicente Males  Per Dr. Darene Lamer, letter sent

## 2018-03-12 NOTE — Telephone Encounter (Signed)
-----   Message from [name], MD sent at 02/25/2018  2:41 PM EST -----  Please set up clinic appointment with me or Dr. Vicente Males in 1-2 months

## 2018-03-13 NOTE — Telephone Encounter (Signed)
Spoke with patient and reviewed that per Dr. Rockey Situ he was ok to resume the xarelto but that if he should have any further questions to please call GI physician to discuss with them. He verbalized understanding with no further questions at this time.

## 2018-03-13 NOTE — Telephone Encounter (Signed)
4 attempts made to reach patient in the last week since initial call from pt. Call will be ended at this time.

## 2018-03-13 NOTE — Telephone Encounter (Signed)
Patient returning call.

## 2018-03-23 NOTE — Progress Notes (Signed)
Cardiology Office Note Date:  03/24/2018  Patient ID:  Nathan Demuro., DOB July 08, 1950, MRN [id number] PCP:  Patient, [name]  Cardiologist:  Dr. Rockey Situ, MD    Chief Complaint: Follow-up  History of Present Illness: Nathan Verdi. is a 68 y.o. male with history of nonobstructive CAD by cath on 07/18/2017, chronic combined CHF secondary to nonischemic cardiomyopathy, pulmonary hypertension, atrial flutter diagnosed in 04/2017 on Xarelto status post TEE/DCCV on 05/01/2017 with recurrent atrial flutter/fibrillation status post atrial flutter ablation on 06/16/2017, recent GI bleed with hemorrhagic shock as outlined below, and hypertension who presents for follow-up of his CAD, nonischemic cardiomyopathy, and A. fib/flutter.  Patient was admitted to the hospital in 04/2017 with new onset atrial flutter with EF of 20 to 25%, diffuse hypokinesis, moderately dilated left atrium, mildly reduced RV systolic function, PASP 42 mmHg.  In this setting, he underwent successful TEE/DCCV on 05/01/2017.  Repeat echo on 05/21/2017 showed continued cardiomyopathy with EF less than 20%, diffuse hypokinesis, mild mitral irritation, mildly dilated left atrium, RV systolic function was normal, unable to estimate PASP.  He subsequently underwent atrial flutter ablation on 06/16/2017.  Right and left cardiac cath on 07/18/2017 showed heavily calcified coronary arteries with mild nonobstructive disease in a left dominant system.  Right heart catheterization showed moderately to severely elevated filling pressures, severe pulmonary hypertension, and severely reduced cardiac output.  Most recent echo from 09/04/2017 showed improvement in LVSF with an EF of 35 to 40%, diffuse hypokinesis, grade 1 diastolic dysfunction, mildly dilated left atrium, RV systolic function was low normal, PASP normal.    Patient was recently admitted to [facility] in late 01/2018 with hemorrhagic shock requiring multiple units of packed red blood cells.  EGD on  02/09/2018 showed erosive gastropathy and Barrett's esophagus.  Patient was unable to do a bowel prep and patient leading to deferment of colonoscopy to outpatient.  He was C diff positive.  Troponin negative x1.  At discharge, patient was noted to have a downtrending hemoglobin from 10.7-9.1.  Chemistries showed a potassium of 3.5 and improving serum creatinine to 1.38.  Initially his Xarelto was held though it was advised to be resumed at time of discharge per discharge summary.  ARB was held secondary to AKI.    Discharge medications: STOP taking these medications   losartan 100 MG tablet Commonly known as:  COZAAR     TAKE these medications   atorvastatin 40 MG tablet Commonly known as:  LIPITOR Take 1 tablet (40 mg total) by mouth daily at 6 PM.   furosemide 40 MG tablet Commonly known as:  LASIX Take 1 tablet (40 mg total) by mouth 2 (two) times daily.   metoprolol succinate 25 MG 24 hr tablet Commonly known as:  TOPROL-XL Take 25 mg by mouth 2 (two) times daily.   potassium chloride SA 20 MEQ tablet Commonly known as:  K-DUR,KLOR-CON Take 20 mEq by mouth 2 (two) times daily.   rivaroxaban 20 MG Tabs tablet Commonly known as:  XARELTO Take 1 tablet (20 mg total) by mouth daily with supper.   spironolactone 25 MG tablet Commonly known as:  ALDACTONE Take 25 mg by mouth daily.   vancomycin 50 mg/mL  oral solution Commonly known as:  VANCOCIN Take 2.5 mLs (125 mg total) by mouth every 6 (six) hours for 9 days.    He has since followed up with the [facility] CHF clinic on 03/03/2018 with a BP of 152/72, weight 231 pounds.  He comes  in doing well today from a cardiac perspective.  He denies any chest pain, shortness of breath, lower extremity swelling, abdominal distention, orthopnea, PND, or early satiety.  No dizziness, presyncope, or syncope.  He has not resumed Xarelto as he is afraid of this medication.  He indicates he may not want to resume any form of  anticoagulation moving forward.  He has not had any falls since he was seen.  No further GI bleed.  He has not followed up with GI since his hospital discharge.  He has not had any blood work drawn since his hospital discharge.  He indicates he has been taking Lasix 40 mg once daily rather than twice daily, Toprol-XL 25 mg once daily rather than twice daily, and spironolactone 25 mg daily along with KCl 20 mEq daily.  He has not been weighing himself at home.  He states his blood pressures are "great."  He is unable to further elaborate on this.  He indicates he does not eat salt and drinks less than 2 L of fluids daily.  He does not have any issues or concerns at this time.  Past Medical History:  Diagnosis Date  . Atrial flutter ([place])    a. s/p TEE/DCCV 04/2017; b. CHADS2VASc => 3 (CHF, HTN, age x 1); c. not compliant with Xarelto  . Chronic combined systolic and diastolic CHF (congestive heart failure) ([place])    a. TTE 2/19: EF 20-25%, diffuse HK, mod dilated LA, mildly reduced RVSF, PASP 42; b. TTE 3/19: EF 20-25%, diffuse HK, RVSF nl, atrial flutter with RVR  . Essential hypertension   . GI bleed    a.  Hemorrhagic shock in 11/19 secondary to erosive gastropathy and Barrett's esophagus requiring multiple units of packed red blood cells  . Pulmonary hypertension ([place])     Past Surgical History:  Procedure Laterality Date  . A-FLUTTER ABLATION N/A 06/16/2017   Procedure: A-FLUTTER ABLATION;  Surgeon: [name], MD;  Location: [place] CV LAB;  Service: Cardiovascular;  Laterality: N/A;  . CARDIAC CATHETERIZATION    . CARDIOVERSION N/A 05/01/2017   Procedure: CARDIOVERSION;  Surgeon: [name], MD;  Location: [facility] ORS;  Service: Cardiovascular;  Laterality: N/A;  . CORONARY ANGIOPLASTY    . ESOPHAGOGASTRODUODENOSCOPY (EGD) WITH PROPOFOL N/A 02/09/2018   Procedure: ESOPHAGOGASTRODUODENOSCOPY (EGD) WITH PROPOFOL;  Surgeon: [name], MD;  Location: [facility] ENDOSCOPY;   Service: Endoscopy;  Laterality: N/A;  . RIGHT/LEFT HEART CATH AND CORONARY ANGIOGRAPHY N/A 07/18/2017   Procedure: RIGHT/LEFT HEART CATH AND CORONARY ANGIOGRAPHY;  Surgeon: [name], MD;  Location: [place] CV LAB;  Service: Cardiovascular;  Laterality: N/A;  . TEE WITHOUT CARDIOVERSION N/A 05/01/2017   Procedure: TRANSESOPHAGEAL ECHOCARDIOGRAM (TEE);  Surgeon: [name], MD;  Location: [facility] ORS;  Service: Cardiovascular;  Laterality: N/A;  . TEE WITHOUT CARDIOVERSION N/A 06/16/2017   Procedure: TRANSESOPHAGEAL ECHOCARDIOGRAM (TEE);  Surgeon: [name], MD;  Location: [facility] ENDOSCOPY;  Service: Cardiovascular;  Laterality: N/A;    Current Meds  Medication Sig  . furosemide (LASIX) 40 MG tablet Take 1 tablet (40 mg total) by mouth 2 (two) times daily. (Patient taking differently: Take 40 mg by mouth daily. )  . metoprolol succinate (TOPROL-XL) 25 MG 24 hr tablet Take 25 mg by mouth daily.   . potassium chloride SA (K-DUR,KLOR-CON) 20 MEQ tablet Take 20 mEq by mouth daily.   . rivaroxaban (XARELTO) 20 MG TABS tablet Take 1 tablet (20 mg total) by mouth daily with  supper.  Marland Kitchen spironolactone (ALDACTONE) 25 MG tablet Take 25 mg by mouth daily.    Allergies:   Patient has no known allergies.   Social History:  The patient  reports that he quit smoking about 11 months ago. He has never used smokeless tobacco. He reports current alcohol use of about 3.0 standard drinks of alcohol per week. He reports that he does not use drugs.   Family History:  The patient's family history includes Alzheimer's disease in his father and mother.  ROS:   Review of Systems  Constitutional: Negative for chills, diaphoresis, fever, malaise/fatigue and weight loss.  HENT: Negative for congestion.   Eyes: Negative for discharge and redness.  Respiratory: Negative for cough, hemoptysis, sputum production, shortness of breath and wheezing.   Cardiovascular: Negative for chest pain, palpitations,  orthopnea, claudication, leg swelling and PND.  Gastrointestinal: Negative for abdominal pain, blood in stool, heartburn, melena, nausea and vomiting.  Genitourinary: Negative for hematuria.  Musculoskeletal: Negative for falls and myalgias.  Skin: Negative for rash.  Neurological: Negative for dizziness, tingling, tremors, sensory change, speech change, focal weakness, loss of consciousness and weakness.  Endo/Heme/Allergies: Does not bruise/bleed easily.  Psychiatric/Behavioral: Negative for substance abuse. The patient is not nervous/anxious.   All other systems reviewed and are negative.    PHYSICAL EXAM:  VS:  BP 140/80 (BP Location: Left Arm, Patient Position: Sitting, Cuff Size: Normal)   Pulse 75   Ht 6\' 5"  (1.956 m)   Wt 230 lb (104.3 kg)   BMI 27.27 kg/m  BMI: Body mass index is 27.27 kg/m.  Physical Exam  Constitutional: He is oriented to person, place, and time. He appears well-developed and well-nourished.  HENT:  Head: Normocephalic and atraumatic.  Eyes: Right eye exhibits no discharge. Left eye exhibits no discharge.  Neck: Normal range of motion. No JVD present.  Cardiovascular: Normal rate, regular rhythm, S1 normal, S2 normal and normal heart sounds. Exam reveals no distant heart sounds, no friction rub, no midsystolic click and no opening snap.  No murmur heard. Pulses:      Posterior tibial pulses are 2+ on the right side and 2+ on the left side.  Pulmonary/Chest: Effort normal and breath sounds normal. No respiratory distress. He has no decreased breath sounds. He has no wheezes. He has no rales. He exhibits no tenderness.  Abdominal: Soft. He exhibits no distension. There is no abdominal tenderness.  Musculoskeletal:        General: No edema.  Neurological: He is alert and oriented to person, place, and time.  Skin: Skin is warm and dry. No cyanosis. Nails show no clubbing.  Psychiatric: He has a normal mood and affect. His speech is normal and behavior is  normal. Judgment and thought content normal.     EKG:  Was ordered and interpreted by me today. Shows NSR, 75 bpm, no acute st/t changes   Recent Labs: 06/15/2017: TSH 4.001 07/15/2017: B Natriuretic Peptide 4,283.0 02/09/2018: Magnesium 2.0 02/10/2018: ALT 12 02/11/2018: BUN 15; Creatinine, Ser 1.38; Platelets 214; Potassium 3.5; Sodium 146 02/12/2018: Hemoglobin 9.1  05/01/2017: Cholesterol 157; HDL 44; LDL Cholesterol 85; Total CHOL/HDL Ratio 3.6; Triglycerides 138; VLDL 28   CrCl cannot be calculated (Patient's most recent lab result is older than the maximum 21 days allowed.).   Wt Readings from Last 3 Encounters:  03/24/18 230 lb (104.3 kg)  03/03/18 231 lb 4 oz (104.9 kg)  02/12/18 232 lb 8 oz (105.5 kg)     Other studies  reviewed: Additional studies/records reviewed today include: summarized above  ASSESSMENT AND PLAN:  1. Nonobstructive CAD: No symptoms concerning for angina.  Previously, he has been on Xarelto in place of aspirin.  However, as outlined below he has not yet restarted his Xarelto and is uncertain if he would even want to consider resuming full dose anticoagulation.  If he chooses to forego full dose oral anticoagulation moving forward, he is aware of stroke risk as outlined below, and we would recommend he start aspirin 81 mg daily given his nonobstructive coronary artery disease.  He refuses resumption of Lipitor as outlined below.  He will otherwise remain on metoprolol.  Aggressive risk factor modification is recommended.  No plans for ischemic evaluation at this time.  2. Chronic combined CHF secondary to nonischemic cardiomyopathy: He is well compensated and euvolemic on exam today.  Restart losartan 25 mg daily.  Continue Toprol-XL 25 mg daily, spironolactone 25 mg daily, and Lasix 40 mg daily.  He has also been on KCl 20 mEq daily, however I have concerns he may not need this given he is on Spironolactone and we are starting losartan.  We are checking a BMP  today and if electrolytes dictate we will discontinue his potassium chloride.  Most recent echo from 08/2017 showed improvement in LVSF.  CHF education was provided.  3. PAF/flutter: Maintaining sinus rhythm.  He has not resumed oral anticoagulation though per the discharge summary it appears he was cleared to do so.  He is uncertain if he would like to consider further full dose anticoagulation moving forward.  He has agreed to allow [place] to check a CBC today to evaluate for improvement in his hemoglobin.  He will think about potentially changing to alternative direct oral anticoagulant such as Eliquis and let us know when we call with his labs.  He is aware of increased stroke risk while not anticoagulated and accepts these risks.  CHADS2VASc at least 3 (CHF, HTN, age x 1).  Continue Toprol-XL for rate control.  4. Recent GI bleed with acute blood loss anemia with hemorrhagic shock: Patient discharged on 02/12/2017 with a hemoglobin trending from 10.7-9.1.  He has not followed up with GI since his discharge.  We have given the patient the phone number to contact GI and schedule an appointment for follow-up.  We are checking a CBC today.  5. Hypertension: Add losartan 25 mg daily.  Continue Toprol-XL, Lasix, and Aldactone.  Checking BMP as above.  6. Hyperlipidemia: Most recent LDL of 85 from 04/2017.  Refuses restarting of Lipitor at this time.  Disposition: F/u with Dr. Rockey Situ or an APP in 3 months.  Current medicines are reviewed at length with the patient today.  The patient did not have any concerns regarding medicines.  Signed, Christell Faith, PA-C 03/24/2018 10:44 AM     [place] [address] [place], [place] [address] [phone]

## 2018-03-24 ENCOUNTER — Encounter: Payer: Self-pay | Admitting: Physician Assistant

## 2018-03-24 ENCOUNTER — Ambulatory Visit (INDEPENDENT_AMBULATORY_CARE_PROVIDER_SITE_OTHER): Payer: Medicare HMO | Admitting: Physician Assistant

## 2018-03-24 VITALS — BP 140/80 | HR 75 | Ht 77.0 in | Wt 230.0 lb

## 2018-03-24 DIAGNOSIS — I428 Other cardiomyopathies: Secondary | ICD-10-CM

## 2018-03-24 DIAGNOSIS — I2510 Atherosclerotic heart disease of native coronary artery without angina pectoris: Secondary | ICD-10-CM

## 2018-03-24 DIAGNOSIS — K922 Gastrointestinal hemorrhage, unspecified: Secondary | ICD-10-CM

## 2018-03-24 DIAGNOSIS — D62 Acute posthemorrhagic anemia: Secondary | ICD-10-CM | POA: Diagnosis not present

## 2018-03-24 DIAGNOSIS — I5042 Chronic combined systolic (congestive) and diastolic (congestive) heart failure: Secondary | ICD-10-CM | POA: Diagnosis not present

## 2018-03-24 DIAGNOSIS — I10 Essential (primary) hypertension: Secondary | ICD-10-CM | POA: Diagnosis not present

## 2018-03-24 DIAGNOSIS — I4892 Unspecified atrial flutter: Secondary | ICD-10-CM

## 2018-03-24 DIAGNOSIS — I480 Paroxysmal atrial fibrillation: Secondary | ICD-10-CM

## 2018-03-24 MED ORDER — LOSARTAN POTASSIUM 25 MG PO TABS: 25.0000 mg | ORAL_TABLET | Freq: Every day | ORAL | 3 refills | Status: DC

## 2018-03-24 NOTE — Patient Instructions (Addendum)
Medication Instructions:  Your physician has recommended you make the following change in your medication:  1- START Losartan 1 tablet (25 mg) once daily   If you need a refill on your cardiac medications before your next appointment, please call your pharmacy.   Lab work: Your physician recommends that you return for lab work today (BMET, CBC)  If you have labs (blood work) drawn today and your tests are completely normal, you will receive your results only by: Marland Kitchen MyChart Message (if you have MyChart) OR . A paper copy in the mail If you have any lab test that is abnormal or we need to change your treatment, we will call you to review the results.  Testing/Procedures: None ordered   Follow-Up: At [facility], you and your health needs are our priority.  As part of our continuing mission to provide you with exceptional heart care, we have created designated Provider Care Teams.  These Care Teams include your primary Cardiologist (physician) and Advanced Practice Providers (APPs -  Physician Assistants and Nurse Practitioners) who all work together to provide you with the care you need, when you need it. You will need a follow up appointment in 3 months.  Please call our office 2 months in advance to schedule this appointment.  You may see Virl Axe, MD or one of the following Advanced Practice Providers on your designated Care Team:   Murray Hodgkins, NP Christell Faith, PA-C . Marrianne Mood, PA-C  Any Other Special Instructions Will Be Listed Below (If Applicable). 1 Please follow up with GI: [name], MD (Gastroenterology [phone]

## 2018-03-30 NOTE — Progress Notes (Signed)
Patient ID: Nathan [name]., male    DOB: 09-May-1950, 68 y.o.   MRN: [id number]  HPI  Nathan [name] is a 68 y/o male with a history of HTN, current tobacco use and chronic heart failure.   Echo report from 09/04/17 reviewed and showed an EF of 35-40%. Echo report from 06/16/17 reviewed and showed an EF of 15-20% along with mod-severe TR and an elevated PA pressure of 45 mm Hg.  Cardiac catheterization done 07/18/17 and showed heavily calcified coronary arteries with mild nonobstructive disease.  Left dominant system. Right heart catheterization showed moderately to severely elevated filling pressures, severe pulmonary hypertension and severely reduced cardiac output.   Admitted 02/07/18 due to GI bleed- initially with hemorrhagicshock. GI and nephrology consults obtained. Received multiple blood transfusions along with IV vitamin K. Endoscopy 02/09/18 showed erosive gastropathy and Barrett's esophagus. Tested positive for C. Diff so to have 10 days of antibiotics. Discharged after 5 days.    He presents today with a chief complaint of a follow-up visit. He currently doesn't have any symptoms and denies any fatigue, chest pain, cough, shortness of breath, chest pain, pedal edema, palpitations, abdominal distention, dizziness, difficulty sleeping or weight gain. Did not get lab work done by cardiology on 03/24/2018 as he says that he was "stuck and stuck" and the labs weren't obtained. Has been taking losartan 100mg  daily for "awhile" even though cardiology note from 03/24/2018 says to resume losartan at 25mg  daily.   Past Medical History:  Diagnosis Date  . Atrial flutter ([place])    a. s/p TEE/DCCV 04/2017; b. CHADS2VASc => 3 (CHF, HTN, age x 1); c. not compliant with Xarelto  . Chronic combined systolic and diastolic CHF (congestive heart failure) ([place])    a. TTE 2/19: EF 20-25%, diffuse HK, mod dilated LA, mildly reduced RVSF, PASP 42; b. TTE 3/19: EF 20-25%, diffuse HK, RVSF nl, atrial flutter with RVR  .  Essential hypertension   . GI bleed    a.  Hemorrhagic shock in 11/19 secondary to erosive gastropathy and Barrett's esophagus requiring multiple units of packed red blood cells  . Pulmonary hypertension ([place])    Past Surgical History:  Procedure Laterality Date  . A-FLUTTER ABLATION N/A 06/16/2017   Procedure: A-FLUTTER ABLATION;  Surgeon: [name], MD;  Location: [place] [facility] LAB;  Service: Cardiovascular;  Laterality: N/A;  . CARDIAC CATHETERIZATION    . CARDIOVERSION N/A 05/01/2017   Procedure: CARDIOVERSION;  Surgeon: [name], MD;  Location: [facility] ORS;  Service: Cardiovascular;  Laterality: N/A;  . CORONARY ANGIOPLASTY    . ESOPHAGOGASTRODUODENOSCOPY (EGD) WITH PROPOFOL N/A 02/09/2018   Procedure: ESOPHAGOGASTRODUODENOSCOPY (EGD) WITH PROPOFOL;  Surgeon: [name], MD;  Location: [facility] ENDOSCOPY;  Service: Endoscopy;  Laterality: N/A;  . RIGHT/LEFT HEART CATH AND CORONARY ANGIOGRAPHY N/A 07/18/2017   Procedure: RIGHT/LEFT HEART CATH AND CORONARY ANGIOGRAPHY;  Surgeon: [name], MD;  Location: [place] [facility] LAB;  Service: Cardiovascular;  Laterality: N/A;  . TEE WITHOUT CARDIOVERSION N/A 05/01/2017   Procedure: TRANSESOPHAGEAL ECHOCARDIOGRAM (TEE);  Surgeon: [name], MD;  Location: [facility] ORS;  Service: Cardiovascular;  Laterality: N/A;  . TEE WITHOUT CARDIOVERSION N/A 06/16/2017   Procedure: TRANSESOPHAGEAL ECHOCARDIOGRAM (TEE);  Surgeon: [name], MD;  Location: [facility] ENDOSCOPY;  Service: Cardiovascular;  Laterality: N/A;   Family History  Problem Relation Age of Onset  . Alzheimer's disease Mother   . Alzheimer's disease Father    Social History   Tobacco Use  .  Smoking status: Former Smoker    Last attempt to quit: 04/21/2017    Years since quitting: 0.9  . Smokeless tobacco: Never Used  Substance Use Topics  . Alcohol use: Yes    Alcohol/week: 3.0 standard drinks    Types: 3 Cans of beer per week    Frequency: Never     Comment: Drink a half of a 40oz beer every day, drank heavily in the past   No Known Allergies  Prior to Admission medications   Medication Sig Start Date End Date Taking? Authorizing Provider  furosemide (LASIX) 40 MG tablet Take 1 tablet (40 mg total) by mouth 2 (two) times daily. Patient taking differently: Take 40 mg by mouth daily.  12/31/17  Yes [name], MD  losartan (COZAAR) 100 MG tablet Take 100 mg by mouth daily.   Yes [provider]  metoprolol succinate (TOPROL-XL) 25 MG 24 hr tablet Take 50 mg by mouth daily.    Yes [provider]  potassium chloride SA (K-DUR,KLOR-CON) 20 MEQ tablet Take 20 mEq by mouth daily.    Yes [provider]  spironolactone (ALDACTONE) 25 MG tablet Take 25 mg by mouth daily.   Yes [provider]  rivaroxaban (XARELTO) 20 MG TABS tablet Take 1 tablet (20 mg total) by mouth daily with supper. Patient not taking: Reported on 03/31/2018 07/22/17   [name], MD    Review of Systems  Constitutional: Negative for appetite change and fatigue.  HENT: Negative for congestion, postnasal drip and sore throat.   Eyes: Negative.   Respiratory: Negative for chest tightness and shortness of breath.   Cardiovascular: Negative for chest pain, palpitations and leg swelling.  Gastrointestinal: Negative for abdominal distention and abdominal pain.  Endocrine: Negative.   Genitourinary: Negative.   Musculoskeletal: Negative for back pain and neck pain.  Skin: Negative.   Allergic/Immunologic: Negative.   Neurological: Negative for dizziness and light-headedness.  Hematological: Negative for adenopathy. Does not bruise/bleed easily.  Psychiatric/Behavioral: Negative for dysphoric mood and sleep disturbance (sleeping on 2 pillows). The patient is not nervous/anxious.    Vitals:   03/31/18 0919  BP: 129/63  Pulse: 70  Resp: 18  SpO2: 100%  Weight: 229 lb 6 oz (104 kg)  Height: 6\' 5"  (1.956 m)   Wt Readings  from Last 3 Encounters:  03/31/18 229 lb 6 oz (104 kg)  03/24/18 230 lb (104.3 kg)  03/03/18 231 lb 4 oz (104.9 kg)   Lab Results  Component Value Date   CREATININE 1.38 (H) 02/11/2018   CREATININE 1.50 (H) 02/10/2018   CREATININE 1.67 (H) 02/09/2018    Physical Exam  Constitutional: He is oriented to person, place, and time. He appears well-developed and well-nourished.  HENT:  Head: Normocephalic and atraumatic.  Neck: Normal range of motion. Neck supple. No JVD present.  Cardiovascular: Normal rate and regular rhythm.  Pulmonary/Chest: Effort normal. No respiratory distress. He has no wheezes. He has no rales.  Abdominal: Soft. He exhibits no distension. There is no abdominal tenderness.  Musculoskeletal:        General: No tenderness or edema.  Neurological: He is alert and oriented to person, place, and time.  Skin: Skin is warm and dry.  Psychiatric: He has a normal mood and affect. His behavior is normal. Thought content normal.  Nursing note and vitals reviewed.   Assessment & Plan:  1: Chronic heart failure with reduced ejection fraction- - NYHA class I - euvolemic today - weighing  daily; Reviwed the importance of calling for any overnight weight gain of >2 pounds or a weekly weight gain of >5 pounds - weight down 2 pounds from last visit here 1 month ago - not adding salt to his food and tries to eat low sodium foods. Reviewed the importance of closely following a 2000mg  sodium diet  - continues to work 3 days a week at [employer] but says that he usually eats a salad or tomato sandwich - saw cardiology ([place]) 03/24/2018 - since currently without HF symptoms, will not switch losartan to entresto - will obtain BMP today since it was unable to be obtained at cardiology visit - BNP on 07/15/17 was 4283.0 - PharmD reconciled medications with the patient  2: HTN- - BP looks good today - losartan was ordered by cardiology on 03/24/2018 to start at 25mg  daily and he's  been taking losartan 100mg  daily - checking BMP today - BMP from 02/11/18 reviewed and showed sodium 146, potassium 3.5, creatinine 1.38 and GFR >60  3: Atrial flutter- - had an ablation done 06/16/17 - still not on anticoagulants due to fear of GI bleed; lengthy discussion was held between him and cardiology.  - will get CBC today since cardiology wanted to check at their last visit  Medication bottles were reviewed.  Return in 2 months or sooner for any questions/problems before then.

## 2018-03-31 ENCOUNTER — Encounter: Payer: Self-pay | Admitting: Physician Assistant

## 2018-03-31 ENCOUNTER — Encounter: Payer: Self-pay | Admitting: Family

## 2018-03-31 ENCOUNTER — Telehealth: Payer: Self-pay

## 2018-03-31 ENCOUNTER — Other Ambulatory Visit
Admission: RE | Admit: 2018-03-31 | Discharge: 2018-03-31 | Disposition: A | Discharge status: Home / Self Care | Payer: Medicare HMO | Source: Ambulatory Visit | Attending: Family | Admitting: Family

## 2018-03-31 ENCOUNTER — Ambulatory Visit: Payer: Medicare HMO | Admitting: Family

## 2018-03-31 VITALS — BP 129/63 | HR 70 | Resp 18 | Ht 77.0 in | Wt 229.4 lb

## 2018-03-31 DIAGNOSIS — I5042 Chronic combined systolic (congestive) and diastolic (congestive) heart failure: Secondary | ICD-10-CM

## 2018-03-31 DIAGNOSIS — I10 Essential (primary) hypertension: Secondary | ICD-10-CM

## 2018-03-31 DIAGNOSIS — K2270 Barrett's esophagus without dysplasia: Secondary | ICD-10-CM | POA: Insufficient documentation

## 2018-03-31 DIAGNOSIS — Z79899 Other long term (current) drug therapy: Secondary | ICD-10-CM | POA: Insufficient documentation

## 2018-03-31 DIAGNOSIS — I110 Hypertensive heart disease with heart failure: Secondary | ICD-10-CM | POA: Diagnosis not present

## 2018-03-31 DIAGNOSIS — I5022 Chronic systolic (congestive) heart failure: Secondary | ICD-10-CM

## 2018-03-31 DIAGNOSIS — K922 Gastrointestinal hemorrhage, unspecified: Secondary | ICD-10-CM

## 2018-03-31 DIAGNOSIS — I4892 Unspecified atrial flutter: Secondary | ICD-10-CM | POA: Insufficient documentation

## 2018-03-31 DIAGNOSIS — Z87891 Personal history of nicotine dependence: Secondary | ICD-10-CM | POA: Insufficient documentation

## 2018-03-31 DIAGNOSIS — Z7901 Long term (current) use of anticoagulants: Secondary | ICD-10-CM

## 2018-03-31 DIAGNOSIS — D62 Acute posthemorrhagic anemia: Secondary | ICD-10-CM

## 2018-03-31 DIAGNOSIS — I272 Pulmonary hypertension, unspecified: Secondary | ICD-10-CM | POA: Insufficient documentation

## 2018-03-31 LAB — CBC
HCT: 33.5 % — ABNORMAL LOW (ref 39.0–52.0)
Hemoglobin: 10.5 g/dL — ABNORMAL LOW (ref 13.0–17.0)
MCH: 26.6 pg (ref 26.0–34.0)
MCHC: 31.3 g/dL (ref 30.0–36.0)
MCV: 84.8 fL (ref 80.0–100.0)
Platelets: 417 10*3/uL — ABNORMAL HIGH (ref 150–400)
RBC: 3.95 MIL/uL — ABNORMAL LOW (ref 4.22–5.81)
RDW: 14.7 % (ref 11.5–15.5)
WBC: 7.2 10*3/uL (ref 4.0–10.5)
nRBC: 0 % (ref 0.0–0.2)

## 2018-03-31 LAB — BASIC METABOLIC PANEL
Anion gap: 9 (ref 5–15)
BUN: 19 mg/dL (ref 8–23)
CO2: 25 mmol/L (ref 22–32)
Calcium: 9.3 mg/dL (ref 8.9–10.3)
Chloride: 106 mmol/L (ref 98–111)
Creatinine, Ser: 1.27 mg/dL — ABNORMAL HIGH (ref 0.61–1.24)
GFR calc Af Amer: >60 mL/min (ref >60)
GFR calc non Af Amer: 58 mL/min — ABNORMAL LOW (ref >60)
GLUCOSE: 66 mg/dL — AB (ref 70–99)
Potassium: 4 mmol/L (ref 3.5–5.1)
Sodium: 140 mmol/L (ref 135–145)

## 2018-03-31 NOTE — Patient Instructions (Signed)
Continue weighing daily and call for an overnight weight gain of > 2 pounds or a weekly weight gain of >5 pounds. 

## 2018-03-31 NOTE — Progress Notes (Unsigned)
Received labs drawn at his CHF appointment that show a low, though improving HGB. Recommend he follow up with GI like we discussed at his office visit. He also noted he did not want to resume [place]. He was made aware this places him at increased stroke risk. Has he decided if he wants to resume [place] or does he prefer to remain off? Renal function continues to improve. Potassium is at goal. It appears he restarted losartan at 100 mg daily rather than the 25 mg daily I had recommended. Nonetheless, his renal function, potassium, and BP all seem to tolerate this. That said, given his recent hyperkalemia, I recommend he stop KCl and we should recheck a bmet in 2 weeks to ensure stable renal function and potassium.

## 2018-03-31 NOTE — Telephone Encounter (Signed)
Call to patient regarding results and suggestions from Christell Faith, [place], "Received labs drawn at his CHF appointment that show a low, though improving HGB. Recommend he follow up with GI like we discussed at his office visit. He also noted he did not want to resume [place]. He was made aware this places him at increased stroke risk. Has he decided if he wants to resume [place] or does he prefer to remain off? Renal function continues to improve. Potassium is at goal. It appears he restarted losartan at 100 mg daily rather than the 25 mg daily I had recommended. Nonetheless, his renal function, potassium, and BP all seem to tolerate this. That said, given his recent hyperkalemia, I recommend he stop KCl and we should recheck a bmet in 2 weeks to ensure stable renal function and potassium. "  When asked if pt had followed up with GI, he said, "I just got back from seeing them today". Chart review shows that he saw HF clinic this am. He had CMP and CBC.  Pt agreeable to start [facility] and will stop taking potassium at this time.   According to chart, GI has made several attempts to set up care including a letter and call to patient. In talking with pt, I feel there is a disconnect in pt understanding of what is being asked of him.  I attempted to rephrase and pt adamant he had that appointment today.   Sent to provider to advise on what medication should be started for anticoagulation as well as additional lab work given that he had it done today.

## 2018-03-31 NOTE — Progress Notes (Signed)
[facility] - PHARMACIST COUNSELING NOTE   ASSESSMENT   (Brief summary of the presentation/problems identified): Pt presents to the clinic for a follow up appointment. Pt is doing well.    Adherence assessment   Patient is using pill bottle as an adherence strategy. Pt takes his medication everywhere he goes, even work.    Do you ever forget to take your medication? [] Yes (1) [x] No (0)  Do you ever skip doses due to side effects? [] Yes (1) [x] No (0)  Do you have trouble affording your medicines? [] Yes (1) [x] No (0)  Are you ever unable to pick up your medication due to transportation difficulties? [] Yes (1) [x] No (0)  Do you ever stop taking your medications because you don't believe they are helping? [] Yes (1) [x] No (0)  Total score _0______      Guideline-Directed Medical Therapy/Evidence Based Medicine   ACE/ARB/ARNI: Losartan 100 mg daily   Beta Blocker: metoprolol 50 mg daily    Aldosterone Antagonist: spironolactone 25 mg daily   Diuretic: Furosemide 40 mg daily    (Problem 1): HFrEF     PLAN   (Plan 1): Pt is going well. Med rec reported pt taking losartan 25 mg daily, but patient was actually taking 100 mg per the bottle that was brought in. No change to the metoprolol today.      SUBJECTIVE   HPI: 68 y.o. male with history of nonobstructive CAD by cath on 07/18/2017, chronic combined CHF secondary to nonischemic cardiomyopathy, pulmonary hypertension, atrial flutter diagnosed in 04/2017 was on Xarelto, per pt this was d/c'ed due to a reaction.    Past Medical History:  Diagnosis Date  . Atrial flutter ([facility])    a. s/p TEE/DCCV 04/2017; b. CHADS2VASc => 3 (CHF, HTN, age x 1); c. not compliant with Xarelto  . Chronic combined systolic and diastolic CHF (congestive heart failure) ([facility])    a. TTE 2/19: EF 20-25%, diffuse HK, mod dilated LA, mildly reduced RVSF, PASP 42; b. TTE 3/19: EF 20-25%, diffuse HK, RVSF nl, atrial  flutter with RVR  . Essential hypertension   . GI bleed    a.  Hemorrhagic shock in 11/19 secondary to erosive gastropathy and Barrett's esophagus requiring multiple units of packed red blood cells  . Pulmonary hypertension ([facility])       Current Outpatient Medications:  .  furosemide (LASIX) 40 MG tablet, Take 1 tablet (40 mg total) by mouth 2 (two) times daily. (Patient taking differently: Take 40 mg by mouth daily. ), Disp: 180 tablet, Rfl: 2 .  losartan (COZAAR) 25 MG tablet, Take 1 tablet (25 mg total) by mouth daily., Disp: 90 tablet, Rfl: 3 .  metoprolol succinate (TOPROL-XL) 25 MG 24 hr tablet, Take 50 mg by mouth daily. , Disp: , Rfl:  .  potassium chloride SA (K-DUR,KLOR-CON) 20 MEQ tablet, Take 20 mEq by mouth daily. , Disp: , Rfl:  .  rivaroxaban (XARELTO) 20 MG TABS tablet, Take 1 tablet (20 mg total) by mouth daily with supper. (Patient not taking: Reported on 03/31/2018), Disp: 30 tablet, Rfl: 6 .  spironolactone (ALDACTONE) 25 MG tablet, Take 25 mg by mouth daily., Disp: , Rfl:     OBJECTIVE    BMP Latest Ref Rng & Units 02/11/2018 02/10/2018 02/09/2018  Glucose 70 - 99 mg/dL 108(H) 100(H) 118(H)  BUN 8 - 23 mg/dL 15 14 24(H)  Creatinine 0.61 - 1.24 mg/dL 1.38(H) 1.50(H) 1.67(H)  Sodium 135 - 145 mmol/L 146(H)  146(H) 147(H)  Potassium 3.5 - 5.1 mmol/L 3.5 3.6 4.0  Chloride 98 - 111 mmol/L 105 104 103  CO2 22 - 32 mmol/L 30 32 32  Calcium 8.9 - 10.3 mg/dL 8.7(L) 8.9 8.5(L)    Vital signs: HR 70, BP 129/70,   PE: Deferred  ECHO: Date 08/2017, EF 35-40%,    DRUGS TO AVOID IN HEART FAILURE  Drug or Class Mechanism  Analgesics . NSAIDs . COX-2 inhibitors . Glucocorticoids  Sodium and water retention, increased systemic vascular resistance, decreased response to diuretics   Diabetes Medications . Metformin . Thiazolidinediones o Rosiglitazone ([place]andia) o Pioglitazone (Actos) . DPP4 Inhibitors o Saxagliptin (Onglyza) o Sitagliptin (Januvia)   Lactic  acidosis Possible calcium channel blockade   Unknown  Antiarrhythmics . Class I  o Flecainide o Disopyramide . Class III o Sotalol . Other o Dronedarone  Negative inotrope, proarrhythmic   Proarrhythmic, beta blockade  Negative inotrope  Antihypertensives . Alpha Blockers o Doxazosin . Calcium Channel Blockers o Diltiazem o Verapamil o Nifedipine . Central Alpha Adrenergics o Moxonidine . Peripheral Vasodilators o Minoxidil  Increases renin and aldosterone  Negative inotrope    Possible sympathetic withdrawal  Unknown  Anti-infective . Itraconazole . Amphotericin B  Negative inotrope Unknown  Hematologic . Anagrelide . Cilostazol   Possible inhibition of PD IV Inhibition of PD III causing arrhythmias  Neurologic/Psychiatric . Stimulants . Anti-Seizure Drugs o Carbamazepine o Pregabalin . Antidepressants o Tricyclics o Citalopram . Parkinsons o Bromocriptine o Pergolide o Pramipexole . Antipsychotics o Clozapine . Antimigraine o Ergotamine o Methysergide . Appetite suppressants . Bipolar o Lithium  Peripheral alpha and beta agonist activity  Negative inotrope and chronotrope Calcium channel blockade  Negative inotrope, proarrhythmic Dose-dependent QT prolongation  Excessive serotonin activity/valvular damage Excessive serotonin activity/valvular damage Unknown  IgE mediated hypersensitivy, calcium channel blockade  Excessive serotonin activity/valvular damage Excessive serotonin activity/valvular damage Valvular damage  Direct myofibrillar degeneration, adrenergic stimulation  Antimalarials . Chloroquine . Hydroxychloroquine Intracellular inhibition of lysosomal enzymes  Urologic Agents . Alpha Blockers o Doxazosin o Prazosin o Tamsulosin o Terazosin  Increased renin and aldosterone  Adapted from Page RL, et al. "Drugs That May Cause or Exacerbate Heart Failure: A Scientific Statement from the [place]." Circulation 2016; 631:S97-W26. DOI: 10.1161/CIR.0000000000000426   COUNSELING POINTS/CLINICAL [facility]AR[place] Metoprolol Succinate (Goal: 200 mg once daily)  Warn patient to avoid activities requiring mental alertness or coordination until drug effects  are realized, as drug may cause dizziness. Tell patient planning major surgery with anesthesia to alert physician that drug is being  used, as drug impairs ability of heart to respond to reflex adrenergic stimuli. Drug may cause diarrhea, fatigue, headache, or depression. Advise diabetic patient to carefully monitor blood glucose as drug may mask symptoms of hypoglycemia. Patient should take extended-release tablet with or immediately following meals. Counsel patient against sudden discontinuation of drug, as this may precipitate hypertension, angina, or myocardial infarction. In the event of a missed dose, counsel patient to skip the missed dose and maintain a regular dosing schedule Losartan (Goal: 150 mg once daily)  Warn male patient to avoid pregnancy and to report a pregnancy that occurs during  therapy.  Side effects may include dizziness, upper respiratory infection, nasal congestion, and back  pain.  Warn patient to avoid use of potassium supplements or potassium-containing salt  substitutes unless they consult healthcare provider. Furosemide  Drug causes sun-sensitivity. Advise patient to use sunscreen and avoid tanning beds. Patient should avoid activities requiring coordination until  drug effects are realized, as drug  may cause dizziness, vertigo, or blurred vision. This drug may cause hyperglycemia, hyperuricemia, constipation, diarrhea, loss of appetite, nausea, vomiting, purpuric disorder, cramps, spasticity, asthenia, headache, paresthesia, or scaling eczema. Instruct patient to report unusual bleeding/bruising or signs/symptoms of hypotension,  infection, pancreatitis, or ototoxicity (tinnitus, hearing  impairment). Advise patient to report signs/symptoms of a severe skin reactions (flu-like symptoms,  spreading red rash, or skin/mucous membrane blistering) or erythema multiforme. Instruct patient to eat high-potassium foods during drug therapy, as directed by healthcare  professional.  Patient should not drink alcohol while taking this drug. Spironolactone  Warn patient to report dehydration, hypotension, or symptoms of worsening renal function.  Counsel male patient to report gynecomastia.  Side effects may include diarrhea, nausea, vomiting, abdominal cramping, fever, leg  cramps, lethargy, mental confusion, decreased libido, irregular menses, and rash. Suspension: Tell patient to take drug consistently with respect to food, either before or after  a meal.  Advise patient to avoid potassium supplements and foods containing high levels of  potassium, including salt substitutes.   MEDICATION ADHERENCES TIPS AND STRATEGIES 1. Taking medication as prescribed improves patient outcomes in heart failure (reduces hospitalizations, improves symptoms, increases survival) 2. Side effects of medications can be managed by decreasing doses, switching agents, stopping drugs, or adding additional therapy. Please let someone in the [facility] know if you have having bothersome side effects so we can modify your regimen. Do not alter your medication regimen without talking to [place].  3. Medication reminders can help patients remember to take drugs on time. If you are missing or forgetting doses you can try linking behaviors, using pill boxes, or an electronic reminder like an alarm on your phone or an app. Some people can also get automated phone calls as medication reminders.   Time spent: 10 minutes  Oswald Hillock, Pharm.D, BCPS Clinical Pharmacist 03/31/2018

## 2018-03-31 NOTE — Telephone Encounter (Signed)
Please advise patient we have record of him seeing the CHF clinic today however we have no documentation of him seeing GI.  He needs to follow-up with GI in the setting of his GI bleed with acute blood loss anemia.  This was discussed with him at his office visit.  Given the Estimated Creatinine Clearance: 71.1 mL/min (A) (by C-G formula based on SCr of 1.27 mg/dL (H)). patient can resume Xarelto 20 mg daily as he was previously cleared for this by reviewing the discharge summary by Dr. Alice Reichert.  He will need follow-up CBC and BMP in 2 weeks to ensure stability of hemoglobin, renal function, and potassium.  Follow-up as planned.

## 2018-04-01 NOTE — Telephone Encounter (Signed)
No answer. Left message to call back.   

## 2018-04-02 NOTE — Telephone Encounter (Signed)
Call attempted, pt not available

## 2018-04-07 NOTE — Telephone Encounter (Signed)
Scheduled 2/4 at 9 am

## 2018-04-07 NOTE — Telephone Encounter (Signed)
attemtped to reach pt, LMTCB.

## 2018-04-07 NOTE — Telephone Encounter (Signed)
Patient returning call.

## 2018-04-07 NOTE — Telephone Encounter (Signed)
Returned call to patient, I gave him number and explained that according to chart he had not seen "stomach specialist" and they had been trying to reach him. I gave him number to their office to call. He reported that he would start back on xarelto.  He agrees to return for lab work in 2 weeks.  He has no complaints at this time.   Advised pt to call for any further questions or concerns  Routed to scheduling for lab visit.

## 2018-04-21 ENCOUNTER — Other Ambulatory Visit: Payer: Medicare HMO

## 2018-04-21 ENCOUNTER — Other Ambulatory Visit
Admission: RE | Admit: 2018-04-21 | Discharge: 2018-04-21 | Disposition: A | Discharge status: Home / Self Care | Payer: Medicare HMO | Source: Ambulatory Visit | Attending: Physician Assistant | Admitting: Physician Assistant

## 2018-04-21 DIAGNOSIS — I10 Essential (primary) hypertension: Secondary | ICD-10-CM | POA: Diagnosis not present

## 2018-04-21 DIAGNOSIS — I5042 Chronic combined systolic (congestive) and diastolic (congestive) heart failure: Secondary | ICD-10-CM | POA: Insufficient documentation

## 2018-04-21 DIAGNOSIS — I4892 Unspecified atrial flutter: Secondary | ICD-10-CM

## 2018-04-21 LAB — CBC
HCT: 33.5 % — ABNORMAL LOW (ref 39.0–52.0)
Hemoglobin: 10.7 g/dL — ABNORMAL LOW (ref 13.0–17.0)
MCH: 26.1 pg (ref 26.0–34.0)
MCHC: 31.9 g/dL (ref 30.0–36.0)
MCV: 81.7 fL (ref 80.0–100.0)
NRBC: 0 % (ref 0.0–0.2)
Platelets: 279 10*3/uL (ref 150–400)
RBC: 4.1 MIL/uL — AB (ref 4.22–5.81)
RDW: 15.6 % — ABNORMAL HIGH (ref 11.5–15.5)
WBC: 5.3 10*3/uL (ref 4.0–10.5)

## 2018-04-21 LAB — BASIC METABOLIC PANEL
Anion gap: 6 (ref 5–15)
BUN: 23 mg/dL (ref 8–23)
CO2: 29 mmol/L (ref 22–32)
Calcium: 9.7 mg/dL (ref 8.9–10.3)
Chloride: 106 mmol/L (ref 98–111)
Creatinine, Ser: 1.62 mg/dL — ABNORMAL HIGH (ref 0.61–1.24)
GFR, EST AFRICAN AMERICAN: 50 mL/min — AB (ref >60)
GFR, EST NON AFRICAN AMERICAN: 43 mL/min — AB (ref >60)
Glucose, Bld: 101 mg/dL — ABNORMAL HIGH (ref 70–99)
Potassium: 4 mmol/L (ref 3.5–5.1)
Sodium: 141 mmol/L (ref 135–145)

## 2018-04-22 ENCOUNTER — Telehealth: Payer: Self-pay | Admitting: *Deleted

## 2018-04-22 DIAGNOSIS — N289 Disorder of kidney and ureter, unspecified: Secondary | ICD-10-CM

## 2018-04-22 MED ORDER — FUROSEMIDE 40 MG PO TABS: ORAL_TABLET | ORAL | Status: DC

## 2018-04-22 NOTE — Telephone Encounter (Signed)
Patient returning call.

## 2018-04-22 NOTE — Telephone Encounter (Signed)
-----   Message from Theora Gianotti, NP sent at 04/21/2018 12:29 PM EST ----- Kidneys appear drier than usual.  It looks like he is taking lasix 40 daily. Reduce to 20 daily and f/u bmet in a week.

## 2018-04-22 NOTE — Telephone Encounter (Signed)
I spoke with the patient. He is aware of her BMP/ CBC results. I have advised him of Nathan Bayley, NP's recommendations to:  1) Decrease lasix (furosemide) 40 mg- to 1/2 tablet (20 mg) once daily  2) repeat a BMP in 1 week  The patient voices understanding of the above. He states he has to call his transportation to schedule to take him to the lab. He asked if he could go Tuesday 2/11 at 9:00 am as this works the best for him. I have advised him this is ok- he will need to go to the [facility]- 1st desk on the right to check in.  Lab orders placed.

## 2018-04-28 ENCOUNTER — Other Ambulatory Visit
Admission: RE | Admit: 2018-04-28 | Discharge: 2018-04-28 | Disposition: A | Discharge status: Home / Self Care | Payer: Medicare HMO | Source: Ambulatory Visit | Attending: Nurse Practitioner | Admitting: Nurse Practitioner

## 2018-04-28 DIAGNOSIS — N289 Disorder of kidney and ureter, unspecified: Secondary | ICD-10-CM | POA: Diagnosis not present

## 2018-04-28 LAB — BASIC METABOLIC PANEL
Anion gap: 4 — ABNORMAL LOW (ref 5–15)
BUN: 18 mg/dL (ref 8–23)
CO2: 28 mmol/L (ref 22–32)
Calcium: 9.5 mg/dL (ref 8.9–10.3)
Chloride: 108 mmol/L (ref 98–111)
Creatinine, Ser: 1.25 mg/dL — ABNORMAL HIGH (ref 0.61–1.24)
GFR calc Af Amer: >60 mL/min (ref >60)
GFR calc non Af Amer: 59 mL/min — ABNORMAL LOW (ref >60)
Glucose, Bld: 70 mg/dL (ref 70–99)
Potassium: 3.7 mmol/L (ref 3.5–5.1)
Sodium: 140 mmol/L (ref 135–145)

## 2018-06-09 ENCOUNTER — Ambulatory Visit: Payer: Medicare HMO | Admitting: Family

## 2018-06-11 DIAGNOSIS — E669 Obesity, unspecified: Secondary | ICD-10-CM | POA: Diagnosis not present

## 2018-06-11 DIAGNOSIS — Z87891 Personal history of nicotine dependence: Secondary | ICD-10-CM | POA: Diagnosis not present

## 2018-06-11 DIAGNOSIS — I509 Heart failure, unspecified: Secondary | ICD-10-CM | POA: Diagnosis not present

## 2018-06-11 DIAGNOSIS — I739 Peripheral vascular disease, unspecified: Secondary | ICD-10-CM | POA: Diagnosis not present

## 2018-06-11 DIAGNOSIS — Z008 Encounter for other general examination: Secondary | ICD-10-CM | POA: Diagnosis not present

## 2018-06-11 DIAGNOSIS — Z6830 Body mass index (BMI) 30.0-30.9, adult: Secondary | ICD-10-CM | POA: Diagnosis not present

## 2018-06-11 DIAGNOSIS — I110 Hypertensive heart disease with heart failure: Secondary | ICD-10-CM | POA: Diagnosis not present

## 2018-06-30 ENCOUNTER — Emergency Department: Payer: Medicare HMO

## 2018-06-30 ENCOUNTER — Other Ambulatory Visit: Payer: Self-pay

## 2018-06-30 ENCOUNTER — Inpatient Hospital Stay: Payer: Medicare HMO

## 2018-06-30 ENCOUNTER — Inpatient Hospital Stay
Admission: EM | Admit: 2018-06-30 | Discharge: 2018-07-07 | DRG: 065 | Disposition: A | Discharge status: Home Health | Payer: Medicare HMO | Attending: Internal Medicine | Admitting: Internal Medicine

## 2018-06-30 DIAGNOSIS — R4781 Slurred speech: Secondary | ICD-10-CM | POA: Diagnosis not present

## 2018-06-30 DIAGNOSIS — R509 Fever, unspecified: Secondary | ICD-10-CM | POA: Diagnosis not present

## 2018-06-30 DIAGNOSIS — Z7401 Bed confinement status: Secondary | ICD-10-CM | POA: Diagnosis not present

## 2018-06-30 DIAGNOSIS — I5042 Chronic combined systolic (congestive) and diastolic (congestive) heart failure: Secondary | ICD-10-CM | POA: Diagnosis not present

## 2018-06-30 DIAGNOSIS — Z7901 Long term (current) use of anticoagulants: Secondary | ICD-10-CM | POA: Diagnosis not present

## 2018-06-30 DIAGNOSIS — R05 Cough: Secondary | ICD-10-CM

## 2018-06-30 DIAGNOSIS — I480 Paroxysmal atrial fibrillation: Secondary | ICD-10-CM | POA: Diagnosis not present

## 2018-06-30 DIAGNOSIS — I428 Other cardiomyopathies: Secondary | ICD-10-CM | POA: Diagnosis not present

## 2018-06-30 DIAGNOSIS — I6389 Other cerebral infarction: Secondary | ICD-10-CM | POA: Diagnosis not present

## 2018-06-30 DIAGNOSIS — I10 Essential (primary) hypertension: Secondary | ICD-10-CM | POA: Diagnosis not present

## 2018-06-30 DIAGNOSIS — I110 Hypertensive heart disease with heart failure: Secondary | ICD-10-CM | POA: Diagnosis not present

## 2018-06-30 DIAGNOSIS — G8194 Hemiplegia, unspecified affecting left nondominant side: Secondary | ICD-10-CM | POA: Diagnosis present

## 2018-06-30 DIAGNOSIS — Z9114 Patient's other noncompliance with medication regimen: Secondary | ICD-10-CM

## 2018-06-30 DIAGNOSIS — I639 Cerebral infarction, unspecified: Principal | ICD-10-CM | POA: Diagnosis present

## 2018-06-30 DIAGNOSIS — Z79899 Other long term (current) drug therapy: Secondary | ICD-10-CM | POA: Diagnosis not present

## 2018-06-30 DIAGNOSIS — I472 Ventricular tachycardia: Secondary | ICD-10-CM | POA: Diagnosis not present

## 2018-06-30 DIAGNOSIS — I272 Pulmonary hypertension, unspecified: Secondary | ICD-10-CM | POA: Diagnosis present

## 2018-06-30 DIAGNOSIS — G8190 Hemiplegia, unspecified affecting unspecified side: Secondary | ICD-10-CM | POA: Diagnosis not present

## 2018-06-30 DIAGNOSIS — R29810 Facial weakness: Secondary | ICD-10-CM | POA: Diagnosis not present

## 2018-06-30 DIAGNOSIS — Z87891 Personal history of nicotine dependence: Secondary | ICD-10-CM | POA: Diagnosis not present

## 2018-06-30 DIAGNOSIS — I2119 ST elevation (STEMI) myocardial infarction involving other coronary artery of inferior wall: Secondary | ICD-10-CM | POA: Diagnosis not present

## 2018-06-30 DIAGNOSIS — R55 Syncope and collapse: Secondary | ICD-10-CM | POA: Diagnosis not present

## 2018-06-30 DIAGNOSIS — I4892 Unspecified atrial flutter: Secondary | ICD-10-CM | POA: Diagnosis present

## 2018-06-30 DIAGNOSIS — R531 Weakness: Secondary | ICD-10-CM | POA: Diagnosis not present

## 2018-06-30 DIAGNOSIS — I420 Dilated cardiomyopathy: Secondary | ICD-10-CM | POA: Diagnosis not present

## 2018-06-30 DIAGNOSIS — I471 Supraventricular tachycardia: Secondary | ICD-10-CM | POA: Diagnosis not present

## 2018-06-30 DIAGNOSIS — R29705 NIHSS score 5: Secondary | ICD-10-CM | POA: Diagnosis not present

## 2018-06-30 DIAGNOSIS — E785 Hyperlipidemia, unspecified: Secondary | ICD-10-CM | POA: Diagnosis present

## 2018-06-30 DIAGNOSIS — M2550 Pain in unspecified joint: Secondary | ICD-10-CM | POA: Diagnosis not present

## 2018-06-30 DIAGNOSIS — Z8679 Personal history of other diseases of the circulatory system: Secondary | ICD-10-CM | POA: Diagnosis not present

## 2018-06-30 DIAGNOSIS — I483 Typical atrial flutter: Secondary | ICD-10-CM | POA: Diagnosis not present

## 2018-06-30 DIAGNOSIS — Z9861 Coronary angioplasty status: Secondary | ICD-10-CM

## 2018-06-30 DIAGNOSIS — R059 Cough, unspecified: Secondary | ICD-10-CM

## 2018-06-30 DIAGNOSIS — G459 Transient cerebral ischemic attack, unspecified: Secondary | ICD-10-CM | POA: Diagnosis not present

## 2018-06-30 DIAGNOSIS — Z20828 Contact with and (suspected) exposure to other viral communicable diseases: Secondary | ICD-10-CM | POA: Diagnosis not present

## 2018-06-30 DIAGNOSIS — I509 Heart failure, unspecified: Secondary | ICD-10-CM | POA: Diagnosis not present

## 2018-06-30 HISTORY — DX: Patient's other noncompliance with medication regimen: Z91.14

## 2018-06-30 HISTORY — DX: Cerebral infarction, unspecified: I63.9

## 2018-06-30 HISTORY — DX: Patient's other noncompliance with medication regimen for other reason: Z91.148

## 2018-06-30 HISTORY — DX: Other cardiomyopathies: I42.8

## 2018-06-30 HISTORY — DX: Paroxysmal atrial fibrillation: I48.0

## 2018-06-30 LAB — COMPREHENSIVE METABOLIC PANEL
ALT: 13 U/L (ref 0–44)
AST: 17 U/L (ref 15–41)
Albumin: 4.1 g/dL (ref 3.5–5.0)
Alkaline Phosphatase: 53 U/L (ref 38–126)
Anion gap: 11 (ref 5–15)
BUN: 22 mg/dL (ref 8–23)
CO2: 23 mmol/L (ref 22–32)
Calcium: 9.7 mg/dL (ref 8.9–10.3)
Chloride: 106 mmol/L (ref 98–111)
Creatinine, Ser: 1.41 mg/dL — ABNORMAL HIGH (ref 0.61–1.24)
GFR calc Af Amer: 59 mL/min — ABNORMAL LOW (ref >60)
GFR calc non Af Amer: 51 mL/min — ABNORMAL LOW (ref >60)
Glucose, Bld: 116 mg/dL — ABNORMAL HIGH (ref 70–99)
Potassium: 4.4 mmol/L (ref 3.5–5.1)
Sodium: 140 mmol/L (ref 135–145)
Total Bilirubin: 0.4 mg/dL (ref 0.3–1.2)
Total Protein: 8.4 g/dL — ABNORMAL HIGH (ref 6.5–8.1)

## 2018-06-30 LAB — CBC
HCT: 38.1 % — ABNORMAL LOW (ref 39.0–52.0)
Hemoglobin: 12.5 g/dL — ABNORMAL LOW (ref 13.0–17.0)
MCH: 26.5 pg (ref 26.0–34.0)
MCHC: 32.8 g/dL (ref 30.0–36.0)
MCV: 80.7 fL (ref 80.0–100.0)
Platelets: 264 10*3/uL (ref 150–400)
RBC: 4.72 MIL/uL (ref 4.22–5.81)
RDW: 17 % — ABNORMAL HIGH (ref 11.5–15.5)
WBC: 5.3 10*3/uL (ref 4.0–10.5)
nRBC: 0 % (ref 0.0–0.2)

## 2018-06-30 LAB — TROPONIN I: Troponin I: <0.03 ng/mL (ref <0.03)

## 2018-06-30 LAB — GLUCOSE, CAPILLARY: Glucose-Capillary: 95 mg/dL (ref 70–99)

## 2018-06-30 MED ORDER — ASPIRIN 300 MG RE SUPP: 300.0000 mg | Freq: Every day | RECTAL | Status: DC

## 2018-06-30 MED ORDER — ACETAMINOPHEN 325 MG PO TABS
650.0000 mg | ORAL_TABLET | ORAL | Status: DC | PRN
  Administered 2018-07-06: 650 mg via ORAL
  Filled 2018-06-30: qty 2

## 2018-06-30 MED ORDER — POTASSIUM CHLORIDE CRYS ER 20 MEQ PO TBCR
20.0000 meq | EXTENDED_RELEASE_TABLET | Freq: Every day | ORAL | Status: DC
  Administered 2018-07-01 – 2018-07-07 (×7): 20 meq via ORAL
  Filled 2018-06-30 (×7): qty 1

## 2018-06-30 MED ORDER — ASPIRIN 81 MG PO CHEW
324.0000 mg | CHEWABLE_TABLET | Freq: Once | ORAL | Status: AC
  Administered 2018-06-30: 324 mg via ORAL
  Filled 2018-06-30: qty 4

## 2018-06-30 MED ORDER — ACETAMINOPHEN 650 MG RE SUPP: 650.0000 mg | RECTAL | Status: DC | PRN

## 2018-06-30 MED ORDER — METOPROLOL SUCCINATE ER 50 MG PO TB24
50.0000 mg | ORAL_TABLET | Freq: Every day | ORAL | Status: DC
  Administered 2018-07-01 – 2018-07-07 (×6): 50 mg via ORAL
  Filled 2018-06-30 (×7): qty 1

## 2018-06-30 MED ORDER — LOSARTAN POTASSIUM 50 MG PO TABS
100.0000 mg | ORAL_TABLET | Freq: Every day | ORAL | Status: DC
  Administered 2018-06-30 – 2018-07-07 (×7): 100 mg via ORAL
  Filled 2018-06-30 (×8): qty 2

## 2018-06-30 MED ORDER — SPIRONOLACTONE 25 MG PO TABS
25.0000 mg | ORAL_TABLET | Freq: Every day | ORAL | Status: DC
  Administered 2018-06-30 – 2018-07-07 (×7): 25 mg via ORAL
  Filled 2018-06-30 (×8): qty 1

## 2018-06-30 MED ORDER — ENOXAPARIN SODIUM 40 MG/0.4ML ~~LOC~~ SOLN
40.0000 mg | SUBCUTANEOUS | Status: DC
  Administered 2018-06-30: 40 mg via SUBCUTANEOUS
  Filled 2018-06-30: qty 0.4

## 2018-06-30 MED ORDER — ASPIRIN 325 MG PO TABS
325.0000 mg | ORAL_TABLET | Freq: Every day | ORAL | Status: DC
  Filled 2018-06-30: qty 1

## 2018-06-30 MED ORDER — FUROSEMIDE 20 MG PO TABS
20.0000 mg | ORAL_TABLET | Freq: Every day | ORAL | Status: DC
  Administered 2018-07-01 – 2018-07-07 (×6): 20 mg via ORAL
  Filled 2018-06-30 (×7): qty 1

## 2018-06-30 MED ORDER — ACETAMINOPHEN 160 MG/5ML PO SOLN
650.0000 mg | ORAL | Status: DC | PRN
  Filled 2018-06-30: qty 20.3

## 2018-06-30 MED ORDER — STROKE: EARLY STAGES OF RECOVERY BOOK
Freq: Once | Status: AC
  Administered 2018-06-30: 22:00:00

## 2018-06-30 NOTE — ED Notes (Signed)
receiving nurse Kennyth Lose called, had no questions about report handoff. Patient off unit to Room.

## 2018-06-30 NOTE — H&P (Signed)
[facility] NAME: Nathan [name]    MR#:  [id number]  DATE OF BIRTH:  02/12/1951  DATE OF ADMISSION:  06/30/2018  PRIMARY CARE PHYSICIAN: Patient, [name]   REQUESTING/REFERRING PHYSICIAN:   CHIEF COMPLAINT:   Chief Complaint  Patient presents with  . Weakness    HISTORY OF PRESENT ILLNESS: Nathan [name]  is a 68 y.o. male with a known history of atrial flutter, chronic systolic and diastolic combined heart failure with reduced ejection fraction of 20 to 25%, hypertension, pulmonary hypertension noncompliant with meds presented to the emergency room with weakness in the left side of the body.  Patient had slurred speech yesterday afternoon.  He noticed weakness in the left upper and lower extremity.  He has a facial droop.  Was evaluated with CT head which showed no acute abnormality.  Stop smoking 1 1/2-year ago.  No complaints of fever cough chills sore throat.  No recent travel or sick contacts.  PAST MEDICAL HISTORY:   Past Medical History:  Diagnosis Date  . Atrial flutter ([place])    a. s/p TEE/DCCV 04/2017; b. CHADS2VASc => 3 (CHF, HTN, age x 1); c. not compliant with Xarelto  . Chronic combined systolic and diastolic CHF (congestive heart failure) ([place])    a. TTE 2/19: EF 20-25%, diffuse HK, mod dilated LA, mildly reduced RVSF, PASP 42; b. TTE 3/19: EF 20-25%, diffuse HK, RVSF nl, atrial flutter with RVR  . Essential hypertension   . GI bleed    a.  Hemorrhagic shock in 11/19 secondary to erosive gastropathy and Barrett's esophagus requiring multiple units of packed red blood cells  . Pulmonary hypertension ([place])     PAST SURGICAL HISTORY:  Past Surgical History:  Procedure Laterality Date  . A-FLUTTER ABLATION N/A 06/16/2017   Procedure: A-FLUTTER ABLATION;  Surgeon: [name], MD;  Location: [facility];  Service: Cardiovascular;  Laterality: N/A;  . CARDIAC CATHETERIZATION    . CARDIOVERSION N/A  05/01/2017   Procedure: CARDIOVERSION;  Surgeon: [name], MD;  Location: [facility] ORS;  Service: Cardiovascular;  Laterality: N/A;  . CORONARY ANGIOPLASTY    . ESOPHAGOGASTRODUODENOSCOPY (EGD) WITH PROPOFOL N/A 02/09/2018   Procedure: ESOPHAGOGASTRODUODENOSCOPY (EGD) WITH PROPOFOL;  Surgeon: [name], MD;  Location: [facility] ENDOSCOPY;  Service: Endoscopy;  Laterality: N/A;  . RIGHT/LEFT HEART CATH AND CORONARY ANGIOGRAPHY N/A 07/18/2017   Procedure: RIGHT/LEFT HEART CATH AND CORONARY ANGIOGRAPHY;  Surgeon: [name], MD;  Location: [facility];  Service: Cardiovascular;  Laterality: N/A;  . TEE WITHOUT CARDIOVERSION N/A 05/01/2017   Procedure: TRANSESOPHAGEAL ECHOCARDIOGRAM (TEE);  Surgeon: [name], MD;  Location: [facility] ORS;  Service: Cardiovascular;  Laterality: N/A;  . TEE WITHOUT CARDIOVERSION N/A 06/16/2017   Procedure: TRANSESOPHAGEAL ECHOCARDIOGRAM (TEE);  Surgeon: [name], MD;  Location: [facility] ENDOSCOPY;  Service: Cardiovascular;  Laterality: N/A;    SOCIAL HISTORY:  Social History   Tobacco Use  . Smoking status: Former Smoker    Last attempt to quit: 04/21/2017    Years since quitting: 1.1  . Smokeless tobacco: Never Used  Substance Use Topics  . Alcohol use: Yes    Alcohol/week: 3.0 standard drinks    Types: 3 Cans of beer per week    Frequency: Never    Comment: Drink a half of a 40oz beer every day, drank heavily in the past    FAMILY HISTORY:  Family History  Problem Relation  Age of Onset  . Alzheimer's disease Mother   . Alzheimer's disease Father     DRUG ALLERGIES: No Known Allergies  REVIEW OF SYSTEMS:   CONSTITUTIONAL: No fever, fatigue or weakness.  EYES: No blurred or double vision.  EARS, NOSE, AND THROAT: No tinnitus or ear pain.  RESPIRATORY: No cough, shortness of breath, wheezing or hemoptysis.  CARDIOVASCULAR: No chest pain, orthopnea, edema.  GASTROINTESTINAL: No nausea, vomiting, diarrhea or abdominal  pain.  GENITOURINARY: No dysuria, hematuria.  ENDOCRINE: No polyuria, nocturia,  HEMATOLOGY: No anemia, easy bruising or bleeding SKIN: No rash or lesion. MUSCULOSKELETAL: No joint pain or arthritis.   NEUROLOGIC: No tingling, numbness,  Weakness left upper and left lower extremity Slurred speech  PSYCHIATRY: No anxiety or depression.   MEDICATIONS AT HOME:  Prior to Admission medications   Medication Sig Start Date End Date Taking? Authorizing Provider  furosemide (LASIX) 40 MG tablet Take 1/2 tablet (20 mg) by mouth once daily 04/22/18   Theora Gianotti, NP  losartan (COZAAR) 100 MG tablet Take 100 mg by mouth daily.    [provider]  metoprolol succinate (TOPROL-XL) 50 MG 24 hr tablet Take 50 mg by mouth daily. 04/07/18   [provider]  potassium chloride SA (K-DUR,KLOR-CON) 20 MEQ tablet Take 20 mEq by mouth daily.     [provider]  rivaroxaban (XARELTO) 20 MG TABS tablet Take 1 tablet (20 mg total) by mouth daily with supper. Patient not taking: Reported on 03/31/2018 07/22/17   [name], MD  spironolactone (ALDACTONE) 25 MG tablet Take 25 mg by mouth daily.    [provider]      PHYSICAL EXAMINATION:   VITAL SIGNS: Blood pressure (!) 175/85, pulse 60, temperature 98.6 F (37 C), temperature source Oral, resp. rate 19, height 6\' 3"  (1.905 m), weight 106.1 kg, SpO2 99 %.  GENERAL:  68 y.o.-year-old patient lying in the bed with no acute distress.  EYES: Pupils equal, round, reactive to light and accommodation. No scleral icterus. Extraocular muscles intact.  HEENT: Head atraumatic, normocephalic. Oropharynx and nasopharynx clear.  NECK:  Supple, no jugular venous distention. No thyroid enlargement, no tenderness.  LUNGS: Normal breath sounds bilaterally, no wheezing, rales,rhonchi or crepitation. No use of accessory muscles of respiration.  CARDIOVASCULAR: S1, S2 normal. No murmurs, rubs, or gallops.  ABDOMEN: Soft,  nontender, nondistended. Bowel sounds present. No organomegaly or mass.  EXTREMITIES: No pedal edema, cyanosis, or clubbing.  NEUROLOGIC: Facial droop noted. Muscle strength 2/5 in left upper and left lower extremity, 5/5 right upper and lower extremities. Sensation intact. Gait not checked.  Has facial droop PSYCHIATRIC: The patient is alert and oriented x 3.  SKIN: No obvious rash, lesion, or ulcer.   LABORATORY PANEL:   CBC Recent Labs  Lab 06/30/18 1501  WBC 5.3  HGB 12.5*  HCT 38.1*  PLT 264  MCV 80.7  MCH 26.5  MCHC 32.8  RDW 17.0*   ------------------------------------------------------------------------------------------------------------------  Chemistries  Recent Labs  Lab 06/30/18 1501  NA 140  K 4.4  CL 106  CO2 23  GLUCOSE 116*  BUN 22  CREATININE 1.41*  CALCIUM 9.7  AST 17  ALT 13  ALKPHOS 53  BILITOT 0.4   ------------------------------------------------------------------------------------------------------------------ estimated creatinine clearance is 66.9 mL/min (A) (by C-G formula based on SCr of 1.41 mg/dL (H)). ------------------------------------------------------------------------------------------------------------------ No results for input(s): TSH, T4TOTAL, T3FREE, THYROIDAB in the last 72 hours.  Invalid input(s): FREET3   Coagulation profile No results for input(s):  INR, PROTIME in the last 168 hours. ------------------------------------------------------------------------------------------------------------------- No results for input(s): DDIMER in the last 72 hours. -------------------------------------------------------------------------------------------------------------------  Cardiac Enzymes Recent Labs  Lab 06/30/18 1501  TROPONINI <0.03   ------------------------------------------------------------------------------------------------------------------ Invalid input(s):  POCBNP  ---------------------------------------------------------------------------------------------------------------  Urinalysis No results found for: COLORURINE, APPEARANCEUR, LABSPEC, PHURINE, GLUCOSEU, HGBUR, BILIRUBINUR, KETONESUR, PROTEINUR, UROBILINOGEN, NITRITE, LEUKOCYTESUR   RADIOLOGY: Ct Head Wo Contrast  Result Date: 06/30/2018 CLINICAL DATA:  Left-sided weakness and right-sided facial droop EXAM: CT HEAD WITHOUT CONTRAST TECHNIQUE: Contiguous axial images were obtained from the base of the skull through the vertex without intravenous contrast. COMPARISON:  February 07, 2018 FINDINGS: Brain: Mild diffuse atrophy is stable. There is no intracranial mass, hemorrhage, extra-axial fluid collection, or midline shift. There is small vessel disease throughout the centra semiovale bilaterally. Small vessel disease is noted in each internal capsule. There is no demonstrable acute infarct. Vascular: There is no appreciable hyperdense vessel. There is calcification in each distal vertebral artery and carotid siphon region. Skull: The bony calvarium appears intact. Sinuses/Orbits: There is mucosal thickening in several ethmoid air cells. Other visualized paranasal sinuses are clear. Orbits appear symmetric bilaterally except for evidence of previous cataract removal on the left. Other: Mastoid air cells are clear. There is debris in each external auditory canal. IMPRESSION: Atrophy with fairly extensive periventricular small vessel disease. Small vessel disease noted in each internal capsule region. No acute infarct. No mass or hemorrhage. There are foci of arterial vascular calcification. There is mucosal thickening in several ethmoid air cells. There is probable cerumen in each external auditory canal. Electronically Signed   By: Lowella Grip III M.D.   On: 06/30/2018 15:22   Dg Chest Portable 1 View  Result Date: 06/30/2018 CLINICAL DATA:  Syncope with heart block EXAM: PORTABLE CHEST 1 VIEW  COMPARISON:  February 09, 2018 FINDINGS: No edema or consolidation. Heart size and pulmonary vascularity are normal. No adenopathy. There is aortic atherosclerosis. No pneumothorax. No bone lesions. IMPRESSION: No edema or consolidation. Heart size within normal limits. Aortic Atherosclerosis (ICD10-I70.0). Electronically Signed   By: Lowella Grip III M.D.   On: 06/30/2018 17:40    EKG: Orders placed or performed in visit on 03/24/18  . EKG 12-Lead    IMPRESSION AND PLAN: 68 year old male patient with a known history of atrial flutter, chronic systolic and diastolic combined heart failure with reduced ejection fraction of 20 to 25%, hypertension, pulmonary hypertension noncompliant with meds presented to the emergency room with weakness in the left side of the body.  -Acute CVA Admit patient to medical floor Start patient on aspirin Physical therapy speech therapy evaluation Neurology consult Neurovascular check Check MRI MRA brain Check carotid ultrasound and echocardiogram  -Combined systolic diastolic heart failure with reduced ejection fraction Continue diuretics there is Aldactone and Lasix Monitor electrolytes  -Atrial flutter Cardiac monitoring Rate under control Continue rate control meds  -DVT prophylaxis with subcu Lovenox daily  All the records are reviewed and case discussed with ED provider. Management plans discussed with the patient, family and they are in agreement.  CODE STATUS:Full code Code Status History    Date Active Date Inactive Code Status Order ID Comments User Context   02/07/2018 0605 02/12/2018 1548 Full Code [id number]  Arta Silence, MD ED   07/15/2017 1955 07/19/2017 1908 Full Code [id number]  SalaryAvel Peace, MD Inpatient   06/16/2017 1915 06/17/2017 1850 Full Code [id number]  [name], MD Inpatient   06/15/2017 1702 06/16/2017 1915 Full Code [id number]  Daune Perch,  NP Inpatient   06/12/2017 2142 06/15/2017 1523 Full Code [id number]   Demetrios Loll, MD Inpatient   04/29/2017 2333 05/01/2017 1920 Full Code [id number]  Lance Coon, MD Inpatient       TOTAL TIME TAKING CARE OF THIS PATIENT: 53 minutes.    Saundra Shelling M.D on 06/30/2018 at 6:41 PM  Between 7am to 6pm - Pager - [phone]  After 6pm go to [email or web address] - password [email or web address] [facility]  Office  [phone]  CC: Primary care physician; Patient, [name]

## 2018-06-30 NOTE — ED Triage Notes (Addendum)
Patient with left side weakness since yesterday 0900 and left facial droop. Called ems for further eval. .

## 2018-06-30 NOTE — ED Notes (Signed)
ED TO INPATIENT HANDOFF REPORT  ED Nurse Name and Phone #:   S Name/Age/Gender Nathan [name]. 68 y.o. male Room/Bed: ED06A/ED06A  Code Status   Code Status: Prior  Home/SNF/Other Home Patient oriented to: self Is this baseline? No   Triage Complete: Triage complete  Chief Complaint l side weakness  Triage Note Patient with left side weakness since yesterday 0900 and left facial droop. Called ems for further eval. .    Allergies No Known Allergies  Level of Care/Admitting Diagnosis ED Disposition    ED Disposition Condition [facility] Area: [facility] [[id number]]  Level of Care: Med-Surg [16]  Diagnosis: CVA (cerebral vascular accident) [facility]) [[id number]]  Admitting Physician: Saundra Shelling [[id number]]  Attending Physician: Saundra Shelling [[id number]]  Estimated length of stay: past midnight tomorrow  Certification:: I certify this patient will need inpatient services for at least 2 midnights  Possible Covid Disease Patient Isolation: N/A  PT Class (Do Not Modify): Inpatient [101]  PT Acc Code (Do Not Modify): Private [1]       B Medical/Surgery History Past Medical History:  Diagnosis Date  . Atrial flutter ([place])    a. s/p TEE/DCCV 04/2017; b. CHADS2VASc => 3 (CHF, HTN, age x 1); c. not compliant with Xarelto  . Chronic combined systolic and diastolic CHF (congestive heart failure) ([place])    a. TTE 2/19: EF 20-25%, diffuse HK, mod dilated LA, mildly reduced RVSF, PASP 42; b. TTE 3/19: EF 20-25%, diffuse HK, RVSF nl, atrial flutter with RVR  . Essential hypertension   . GI bleed    a.  Hemorrhagic shock in 11/19 secondary to erosive gastropathy and Barrett's esophagus requiring multiple units of packed red blood cells  . Pulmonary hypertension ([place])    Past Surgical History:  Procedure Laterality Date  . A-FLUTTER ABLATION N/A 06/16/2017   Procedure: A-FLUTTER ABLATION;  Surgeon: [name], MD;  Location: [facility];   Service: Cardiovascular;  Laterality: N/A;  . CARDIAC CATHETERIZATION    . CARDIOVERSION N/A 05/01/2017   Procedure: CARDIOVERSION;  Surgeon: [name], MD;  Location: [facility] ORS;  Service: Cardiovascular;  Laterality: N/A;  . CORONARY ANGIOPLASTY    . ESOPHAGOGASTRODUODENOSCOPY (EGD) WITH PROPOFOL N/A 02/09/2018   Procedure: ESOPHAGOGASTRODUODENOSCOPY (EGD) WITH PROPOFOL;  Surgeon: [name], MD;  Location: [facility] ENDOSCOPY;  Service: Endoscopy;  Laterality: N/A;  . RIGHT/LEFT HEART CATH AND CORONARY ANGIOGRAPHY N/A 07/18/2017   Procedure: RIGHT/LEFT HEART CATH AND CORONARY ANGIOGRAPHY;  Surgeon: [name], MD;  Location: [facility];  Service: Cardiovascular;  Laterality: N/A;  . TEE WITHOUT CARDIOVERSION N/A 05/01/2017   Procedure: TRANSESOPHAGEAL ECHOCARDIOGRAM (TEE);  Surgeon: [name], MD;  Location: [facility] ORS;  Service: Cardiovascular;  Laterality: N/A;  . TEE WITHOUT CARDIOVERSION N/A 06/16/2017   Procedure: TRANSESOPHAGEAL ECHOCARDIOGRAM (TEE);  Surgeon: [name], MD;  Location: [facility] ENDOSCOPY;  Service: Cardiovascular;  Laterality: N/A;     A IV Location/Drains/Wounds Patient Lines/Drains/Airways Status   Active Line/Drains/Airways    Name:   Placement date:   Placement time:   Site:   Days:   Peripheral IV 06/30/18 Right Forearm   06/30/18    1453    Forearm   less than 1          Intake/Output Last 24 hours  Intake/Output Summary (Last 24 hours) at 06/30/2018 1853 Last data filed at 06/30/2018 1805 Gross per 24 hour  Intake -  Output 300 ml  Net -300 ml    Labs/Imaging Results for orders placed or performed during the hospital encounter of 06/30/18 (from the past 48 hour(s))  Glucose, capillary     Status: None   Collection Time: 06/30/18  2:53 PM  Result Value Ref Range   Glucose-Capillary 95 70 - 99 mg/dL  CBC     Status: Abnormal   Collection Time: 06/30/18  3:01 PM  Result Value Ref Range   WBC 5.3 4.0 - 10.5 K/uL    RBC 4.72 4.22 - 5.81 MIL/uL   Hemoglobin 12.5 (L) 13.0 - 17.0 g/dL   HCT 38.1 (L) 39.0 - 52.0 %   MCV 80.7 80.0 - 100.0 fL   MCH 26.5 26.0 - 34.0 pg   MCHC 32.8 30.0 - 36.0 g/dL   RDW 17.0 (H) 11.5 - 15.5 %   Platelets 264 150 - 400 K/uL   nRBC 0.0 0.0 - 0.2 %    Comment: Performed at [facility], [address] [place], [place] [address]  Comprehensive metabolic panel     Status: Abnormal   Collection Time: 06/30/18  3:01 PM  Result Value Ref Range   Sodium 140 135 - 145 mmol/L   Potassium 4.4 3.5 - 5.1 mmol/L   Chloride 106 98 - 111 mmol/L   CO2 23 22 - 32 mmol/L   Glucose, Bld 116 (H) 70 - 99 mg/dL   BUN 22 8 - 23 mg/dL   Creatinine, Ser 1.41 (H) 0.61 - 1.24 mg/dL   Calcium 9.7 8.9 - 10.3 mg/dL   Total Protein 8.4 (H) 6.5 - 8.1 g/dL   Albumin 4.1 3.5 - 5.0 g/dL   AST 17 15 - 41 U/L   ALT 13 0 - 44 U/L   Alkaline Phosphatase 53 38 - 126 U/L   Total Bilirubin 0.4 0.3 - 1.2 mg/dL   GFR calc non Af Amer 51 (L) >60 mL/min   GFR calc Af Amer 59 (L) >60 mL/min   Anion gap 11 5 - 15    Comment: Performed at [facility], [place], [place] [address]  Troponin I - ONCE - STAT     Status: None   Collection Time: 06/30/18  3:01 PM  Result Value Ref Range   Troponin I <0.03 <0.03 ng/mL    Comment: Performed at [facility], [address] [place],  [address]   Ct Head Wo Contrast  Result Date: 06/30/2018 CLINICAL DATA:  Left-sided weakness and right-sided facial droop EXAM: CT HEAD WITHOUT CONTRAST TECHNIQUE: Contiguous axial images were obtained from the base of the skull through the vertex without intravenous contrast. COMPARISON:  February 07, 2018 FINDINGS: Brain: Mild diffuse atrophy is stable. There is no intracranial mass, hemorrhage, extra-axial fluid collection, or midline shift. There is small vessel disease throughout the centra semiovale bilaterally. Small vessel disease is noted in each internal capsule. There is no  demonstrable acute infarct. Vascular: There is no appreciable hyperdense vessel. There is calcification in each distal vertebral artery and carotid siphon region. Skull: The bony calvarium appears intact. Sinuses/Orbits: There is mucosal thickening in several ethmoid air cells. Other visualized paranasal sinuses are clear. Orbits appear symmetric bilaterally except for evidence of previous cataract removal on the left. Other: Mastoid air cells are clear. There is debris in each external auditory canal. IMPRESSION: Atrophy with fairly extensive periventricular small vessel disease. Small vessel disease noted in each internal capsule region. No acute infarct. No mass or hemorrhage. There are  foci of arterial vascular calcification. There is mucosal thickening in several ethmoid air cells. There is probable cerumen in each external auditory canal. Electronically Signed   By: Lowella Grip III M.D.   On: 06/30/2018 15:22   Dg Chest Portable 1 View  Result Date: 06/30/2018 CLINICAL DATA:  Syncope with heart block EXAM: PORTABLE CHEST 1 VIEW COMPARISON:  February 09, 2018 FINDINGS: No edema or consolidation. Heart size and pulmonary vascularity are normal. No adenopathy. There is aortic atherosclerosis. No pneumothorax. No bone lesions. IMPRESSION: No edema or consolidation. Heart size within normal limits. Aortic Atherosclerosis (ICD10-I70.0). Electronically Signed   By: Lowella Grip III M.D.   On: 06/30/2018 17:40    Pending Labs [employer] (From admission, onward)    Start     Ordered   Signed and Held  Lipid panel  Tomorrow morning,   R    Comments:  Fasting    Signed and Held   Signed and Held  CBC  (enoxaparin (LOVENOX)    CrCl >/= 30 ml/min)  Once,   R    Comments:  Baseline for enoxaparin therapy IF NOT ALREADY DRAWN.  Notify MD if PLT < 100 K.    Signed and Held   Signed and Held  Creatinine, serum  (enoxaparin (LOVENOX)    CrCl >/= 30 ml/min)  Once,   R    Comments:  Baseline for  enoxaparin therapy IF NOT ALREADY DRAWN.    Signed and Held   Signed and Held  Creatinine, serum  (enoxaparin (LOVENOX)    CrCl >/= 30 ml/min)  Weekly,   R    Comments:  while on enoxaparin therapy    Signed and Held          Vitals/Pain Today's Vitals   06/30/18 1727 06/30/18 1730 06/30/18 1745 06/30/18 1800  BP: (!) 184/94 (!) 160/96  (!) 175/85  Pulse: 60     Resp: 17 16 20 19   Temp:      TempSrc:      SpO2: 99%     Weight:      Height:      PainSc: 0-No pain       Isolation Precautions No active isolations  Medications Medications  aspirin chewable tablet 324 mg (324 mg Oral Given 06/30/18 1723)    Mobility walks with device High fall risk   Focused Assessments Neuro Assessment Handoff:  Swallow screen pass? Yes    NIH Stroke Scale ( + Modified Stroke Scale Criteria)  Interval: Initial Level of Consciousness (1a.)   : Alert, keenly responsive LOC Questions (1b. )   +: Answers both questions correctly LOC Commands (1c. )   + : Performs one task correctly Best Gaze (2. )  +: Normal Visual (3. )  +: Complete hemianopia Facial Palsy (4. )    : Partial paralysis  Motor Arm, Left (5a. )   +: No effort against gravity Motor Arm, Right (5b. )   +: Drift Motor Leg, Left (6a. )   +: No effort against gravity Motor Leg, Right (6b. )   +: No drift Limb Ataxia (7. ): Absent Sensory (8. )   +: Normal, no sensory loss Best Language (9. )   +: Severe aphasia Dysarthria (10. ): Normal Extinction/Inattention (11.)   +: No Abnormality Modified SS Total  +: 12 Complete NIHSS TOTAL: 14 Last date known well: 06/29/18 Last time known well: 0900 Neuro Assessment: Within Defined Limits Neuro Checks:   Initial (06/30/18 1447)  Last Documented NIHSS Modified Score: 12 (06/30/18 1447) Has TPA been given? No If patient is a Neuro Trauma and patient is going to OR before floor call report to [place] nurse: [phone] or [phone]     R Recommendations: See  Admitting Provider Note  Report given to:   Additional Notes:

## 2018-06-30 NOTE — Progress Notes (Addendum)
Advanced care plan. Purpose of the Encounter: CODE STATUS Parties in Attendance: Patient Patient's Decision Capacity: Good Subjective/Patient's story: Nathan [name]  is a 68 y.o. male with a known history of atrial flutter, chronic systolic and diastolic combined heart failure with reduced ejection fraction of 20 to 25%, hypertension, pulmonary hypertension noncompliant with meds presented to the emergency room with weakness in the left side of the body Objective/Medical story Patient was evaluated with CT head in the emergency room.  Needs stroke work-up with MRI MRA brain, carotid ultrasound and echocardiogram.  Neurology consultation. Goals of care determination:  Advance care directives goals of care and treatment plan discussed. Patient wants everything done which includes CPR, intubation ventilator if the need arises CODE STATUS: Full code Time spent discussing advanced care planning: 16 minutes

## 2018-06-30 NOTE — ED Provider Notes (Signed)
[facility] Emergency Department Provider Note  ____________________________________________  Time seen: Approximately 5:17 PM  I have reviewed the triage vital signs and the nursing notes.   HISTORY  Chief Complaint Weakness    HPI Rockwell Zentz. is a 68 y.o. male who denies past medical history but has a recorded history in the medical record of atrial flutter, combined congestive heart failure, hypertension, pulmonary hypertension, and GI bleed, who comes the ED complaining of left-sided weakness that started at 9 PM yesterday.  He denies any vision changes headache or speech difficulty.  Sudden onset, constant, no aggravating or alleviating factors.  Never had anything like this before.  Reports that is not taking any medications.  He had previously been on Xarelto which she states he is no longer taking.      Past Medical History:  Diagnosis Date  . Atrial flutter ([place])    a. s/p TEE/DCCV 04/2017; b. CHADS2VASc => 3 (CHF, HTN, age x 1); c. not compliant with Xarelto  . Chronic combined systolic and diastolic CHF (congestive heart failure) ([place])    a. TTE 2/19: EF 20-25%, diffuse HK, mod dilated LA, mildly reduced RVSF, PASP 42; b. TTE 3/19: EF 20-25%, diffuse HK, RVSF nl, atrial flutter with RVR  . Essential hypertension   . GI bleed    a.  Hemorrhagic shock in 11/19 secondary to erosive gastropathy and Barrett's esophagus requiring multiple units of packed red blood cells  . Pulmonary hypertension [facility])      Patient Active Problem List   Diagnosis Date Noted  . CLE (columnar lined esophagus)   . Stomach irritation   . Acute posthemorrhagic anemia   . Hyperkalemia   . Dilated cardiomyopathy ([place]) 08/26/2017  . Chronic a-fib 07/15/2017  . Chronic systolic heart failure ([place]) 07/09/2017  . HTN (hypertension) 07/09/2017  . Tobacco use 07/09/2017  . Atrial flutter ([place]) 06/15/2017  . A-fib ([place]) 06/12/2017  . Atrial flutter with rapid ventricular  response ([place]) 04/29/2017  . Elevated troponin 04/29/2017  . AKI (acute kidney injury) ([place]) 04/29/2017     Past Surgical History:  Procedure Laterality Date  . A-FLUTTER ABLATION N/A 06/16/2017   Procedure: A-FLUTTER ABLATION;  Surgeon: [name], MD;  Location: [place] CV LAB;  Service: Cardiovascular;  Laterality: N/A;  . CARDIAC CATHETERIZATION    . CARDIOVERSION N/A 05/01/2017   Procedure: CARDIOVERSION;  Surgeon: [name], MD;  Location: [facility] ORS;  Service: Cardiovascular;  Laterality: N/A;  . CORONARY ANGIOPLASTY    . ESOPHAGOGASTRODUODENOSCOPY (EGD) WITH PROPOFOL N/A 02/09/2018   Procedure: ESOPHAGOGASTRODUODENOSCOPY (EGD) WITH PROPOFOL;  Surgeon: [name], MD;  Location: [facility] ENDOSCOPY;  Service: Endoscopy;  Laterality: N/A;  . RIGHT/LEFT HEART CATH AND CORONARY ANGIOGRAPHY N/A 07/18/2017   Procedure: RIGHT/LEFT HEART CATH AND CORONARY ANGIOGRAPHY;  Surgeon: [name], MD;  Location: [place] CV LAB;  Service: Cardiovascular;  Laterality: N/A;  . TEE WITHOUT CARDIOVERSION N/A 05/01/2017   Procedure: TRANSESOPHAGEAL ECHOCARDIOGRAM (TEE);  Surgeon: [name], MD;  Location: [facility] ORS;  Service: Cardiovascular;  Laterality: N/A;  . TEE WITHOUT CARDIOVERSION N/A 06/16/2017   Procedure: TRANSESOPHAGEAL ECHOCARDIOGRAM (TEE);  Surgeon: [name], MD;  Location: [facility] ENDOSCOPY;  Service: Cardiovascular;  Laterality: N/A;     Prior to Admission medications   Medication Sig Start Date End Date Taking? Authorizing Provider  furosemide (LASIX) 40 MG tablet Take 1/2 tablet (20 mg) by mouth once daily 04/22/18   Theora Gianotti, NP  losartan (COZAAR) 100 MG tablet Take 100 mg by mouth daily.    [provider]  metoprolol succinate (TOPROL-XL) 50 MG 24 hr tablet Take 50 mg by mouth daily. 04/07/18   [provider]  potassium chloride SA (K-DUR,KLOR-CON) 20 MEQ tablet Take 20 mEq by mouth daily.     [provider]  rivaroxaban (XARELTO) 20 MG TABS tablet Take 1 tablet (20 mg total) by mouth daily with supper. Patient not taking: Reported on 03/31/2018 07/22/17   [name], MD  spironolactone (ALDACTONE) 25 MG tablet Take 25 mg by mouth daily.    [provider]     Allergies Patient has no known allergies.   Family History  Problem Relation Age of Onset  . Alzheimer's disease Mother   . Alzheimer's disease Father     Social History Social History   Tobacco Use  . Smoking status: Former Smoker    Last attempt to quit: 04/21/2017    Years since quitting: 1.1  . Smokeless tobacco: Never Used  Substance Use Topics  . Alcohol use: Yes    Alcohol/week: 3.0 standard drinks    Types: 3 Cans of beer per week    Frequency: Never    Comment: Drink a half of a 40oz beer every day, drank heavily in the past  . Drug use: Never    Review of Systems  Constitutional:   No fever or chills.  ENT:   No sore throat. No rhinorrhea. Cardiovascular:   No chest pain or syncope. Respiratory:   No dyspnea or cough. Gastrointestinal:   Negative for abdominal pain, vomiting and diarrhea.  Musculoskeletal:   Negative for focal pain or swelling All other systems reviewed and are negative except as documented above in ROS and HPI.  ____________________________________________   PHYSICAL EXAM:  VITAL SIGNS: ED Triage Vitals  Enc Vitals Group     BP 06/30/18 1440 (!) 156/91     Pulse Rate 06/30/18 1614 62     Resp 06/30/18 1440 19     Temp 06/30/18 1440 98.6 F (37 C)     Temp Source 06/30/18 1440 Oral     SpO2 06/30/18 1440 100 %     Weight 06/30/18 1443 234 lb (106.1 kg)     Height 06/30/18 1443 6\' 3"  (1.905 m)     Head Circumference --      Peak Flow --      Pain Score 06/30/18 1443 0     Pain Loc --      Pain Edu? --      Excl. in [place]? --     Vital signs reviewed, nursing assessments reviewed.   Constitutional:   Alert and oriented. Non-toxic  appearance. Eyes:   Conjunctivae are normal. EOMI. PERRL. ENT      Head:   Normocephalic and atraumatic.      Nose:   No congestion/rhinnorhea.       Mouth/Throat:   MMM, no pharyngeal erythema. No peritonsillar mass.       Neck:   No meningismus. Full ROM. Hematological/Lymphatic/Immunilogical:   No cervical lymphadenopathy. Cardiovascular:   RRR. Symmetric bilateral radial and DP pulses.  No murmurs. Cap refill less than 2 seconds. Respiratory:   Normal respiratory effort without tachypnea/retractions. Breath sounds are clear and equal bilaterally. No wheezes/rales/rhonchi. Gastrointestinal:   Soft and nontender. Non distended. There is no CVA tenderness.  No rebound, rigidity, or guarding. Genitourinary:   deferred Musculoskeletal:   Normal range of motion  in all extremities. No joint effusions.  No lower extremity tenderness.  No edema. Neurologic:   Normal speech and language.  Cranial nerves III through XI intact.  Slight tongue deviation to the left.   5/5 motor strength in muscle groups on the right side.  2/5 motor strength in the left upper extremity and left lower extremity. Sensation symmetric. NIH stroke scale = 5 Skin:    Skin is warm, dry and intact. No rash noted.  No petechiae, purpura, or bullae.  ____________________________________________    LABS (pertinent positives/negatives) (all labs ordered are listed, but only abnormal results are displayed) Labs Reviewed  CBC - Abnormal; Notable for the following components:      Result Value   Hemoglobin 12.5 (*)    HCT 38.1 (*)    RDW 17.0 (*)    All other components within normal limits  COMPREHENSIVE METABOLIC PANEL - Abnormal; Notable for the following components:   Glucose, Bld 116 (*)    Creatinine, Ser 1.41 (*)    Total Protein 8.4 (*)    GFR calc non Af Amer 51 (*)    GFR calc Af Amer 59 (*)    All other components within normal limits  GLUCOSE, CAPILLARY  TROPONIN I    ____________________________________________   EKG  Interpreted by me Sinus rhythm rate of 66, normal axis and intervals.  Normal QRS ST segments and T waves.  ____________________________________________    RADIOLOGY  Ct Head Wo Contrast  Result Date: 06/30/2018 CLINICAL DATA:  Left-sided weakness and right-sided facial droop EXAM: CT HEAD WITHOUT CONTRAST TECHNIQUE: Contiguous axial images were obtained from the base of the skull through the vertex without intravenous contrast. COMPARISON:  February 07, 2018 FINDINGS: Brain: Mild diffuse atrophy is stable. There is no intracranial mass, hemorrhage, extra-axial fluid collection, or midline shift. There is small vessel disease throughout the centra semiovale bilaterally. Small vessel disease is noted in each internal capsule. There is no demonstrable acute infarct. Vascular: There is no appreciable hyperdense vessel. There is calcification in each distal vertebral artery and carotid siphon region. Skull: The bony calvarium appears intact. Sinuses/Orbits: There is mucosal thickening in several ethmoid air cells. Other visualized paranasal sinuses are clear. Orbits appear symmetric bilaterally except for evidence of previous cataract removal on the left. Other: Mastoid air cells are clear. There is debris in each external auditory canal. IMPRESSION: Atrophy with fairly extensive periventricular small vessel disease. Small vessel disease noted in each internal capsule region. No acute infarct. No mass or hemorrhage. There are foci of arterial vascular calcification. There is mucosal thickening in several ethmoid air cells. There is probable cerumen in each external auditory canal. Electronically Signed   By: Lowella Grip III M.D.   On: 06/30/2018 15:22    ____________________________________________   PROCEDURES Procedures  ____________________________________________  DIFFERENTIAL DIAGNOSIS   Stroke, intracranial hemorrhage,  intracranial tumor  CLINICAL IMPRESSION / ASSESSMENT AND PLAN / ED COURSE  Medications ordered in the ED: Medications  aspirin chewable tablet 324 mg (has no administration in time range)    Pertinent labs & imaging results that were available during my care of the patient were reviewed by me and considered in my medical decision making (see chart for details).  Tharon Bomar. was evaluated in Emergency Department on 06/30/2018 for the symptoms described in the history of present illness. He was evaluated in the context of the global COVID-19 pandemic, which necessitated consideration that the patient might be at risk for infection with the  SARS-CoV-2 virus that causes COVID-19. Institutional protocols and algorithms that pertain to the evaluation of patients at risk for COVID-19 are in a state of rapid change based on information released by regulatory bodies including the CDC and federal and state organizations. These policies and algorithms were followed during the patient's care in the ED.   Patient presents with left-sided weakness since 9 PM yesterday, concerning for ischemic stroke primarily.  He is outside of window for TPA, Lucianne Lei negative, not a candidate for emergent endovascular procedure.  We will give a full dose aspirin, plan to admit for further stroke work-up.      ____________________________________________   FINAL CLINICAL IMPRESSION(S) / ED DIAGNOSES    Final diagnoses:  Acute ischemic stroke [facility])     ED Discharge Orders    None      Portions of this note were generated with dragon dictation software. Dictation errors may occur despite best attempts at proofreading.   Carrie Mew, MD 06/30/18 1721

## 2018-06-30 NOTE — ED Notes (Signed)
Xray at the bedside.

## 2018-06-30 NOTE — ED Notes (Signed)
Blood glucose 95

## 2018-07-01 ENCOUNTER — Encounter: Payer: Self-pay | Admitting: Nurse Practitioner

## 2018-07-01 ENCOUNTER — Inpatient Hospital Stay (HOSPITAL_COMMUNITY)
Admit: 2018-07-01 | Discharge: 2018-07-01 | Disposition: A | Discharge status: Home / Self Care | Payer: Medicare HMO | Attending: Internal Medicine | Admitting: Internal Medicine

## 2018-07-01 ENCOUNTER — Inpatient Hospital Stay: Payer: Medicare HMO

## 2018-07-01 DIAGNOSIS — I420 Dilated cardiomyopathy: Secondary | ICD-10-CM

## 2018-07-01 DIAGNOSIS — I483 Typical atrial flutter: Secondary | ICD-10-CM

## 2018-07-01 DIAGNOSIS — I639 Cerebral infarction, unspecified: Secondary | ICD-10-CM

## 2018-07-01 DIAGNOSIS — I480 Paroxysmal atrial fibrillation: Secondary | ICD-10-CM

## 2018-07-01 LAB — LIPID PANEL
Cholesterol: 198 mg/dL (ref 0–200)
HDL: 55 mg/dL (ref >40)
LDL Cholesterol: 121 mg/dL — ABNORMAL HIGH (ref 0–99)
Total CHOL/HDL Ratio: 3.6 RATIO
Triglycerides: 112 mg/dL (ref <150)
VLDL: 22 mg/dL (ref 0–40)

## 2018-07-01 MED ORDER — PANTOPRAZOLE SODIUM 40 MG PO TBEC
40.0000 mg | DELAYED_RELEASE_TABLET | Freq: Every day | ORAL | Status: DC
  Administered 2018-07-01 – 2018-07-07 (×7): 40 mg via ORAL
  Filled 2018-07-01 (×8): qty 1

## 2018-07-01 MED ORDER — APIXABAN 5 MG PO TABS
5.0000 mg | ORAL_TABLET | Freq: Two times a day (BID) | ORAL | Status: DC
  Administered 2018-07-01 – 2018-07-07 (×12): 5 mg via ORAL
  Filled 2018-07-01 (×12): qty 1

## 2018-07-01 NOTE — Progress Notes (Signed)
*  PRELIMINARY RESULTS* Echocardiogram 2D Echocardiogram has been performed.  Nathan [name] 07/01/2018, 11:56 AM

## 2018-07-01 NOTE — Consult Note (Signed)
Nathan [name] for Apixaban Indication: atrial fibrillation  No Known Allergies  Patient Measurements: Height: 6\' 3"  (190.5 cm) Weight: 223 lb 14.4 oz (101.6 kg) IBW/kg (Calculated) : 84.5  Vital Signs: Temp: 97.5 F (36.4 C) (04/15 0749) Temp Source: Oral (04/15 0749) BP: 122/85 (04/15 0749) Pulse Rate: 65 (04/15 0749)  Labs: Recent Labs    06/30/18 1501  HGB 12.5*  HCT 38.1*  PLT 264  CREATININE 1.41*  TROPONINI <0.03    Estimated Creatinine Clearance: 65.7 mL/min (A) (by C-G formula based on SCr of 1.41 mg/dL (H)).   Medical History: Past Medical History:  Diagnosis Date  . Atrial flutter ([place])    a. s/p TEE/DCCV 04/2017; b. 06/2017 s/p RFCA; c. CHADS2VASc => 5 (CHF, HTN, age x 1, CVA)-->noncompliant with Xarelto.  . Chronic combined systolic and diastolic CHF (congestive heart failure) ([place])    a. TTE 2/19: EF 20-25%, diffuse HK, mod dilated LA, mildly reduced RVSF, PASP 42; b. TTE 3/19: EF 20-25%, diffuse HK, RVSF nl, atrial flutter with RVR; c. 08/2017 Echo: EF 35-40%, diff HK. Gr1 DD. mildly dil LA. Low nl RV fxn.   . Essential hypertension   . GI bleed    a.  Hemorrhagic shock in 11/19 secondary to erosive gastropathy and Barrett's esophagus requiring multiple units PRBCs; b. Cleared to resume OAC->pt did not.  Marland Kitchen NICM (nonischemic cardiomyopathy) ([place])    a. 04/2017: Echo EF 20-25%; b. TTE 3/19: EF 20-25%; c. 07/2017 Cath: min irregs; d. 08/2017 Echo: EF 35-40%, diff HK. Gr1 DD. mildly dil LA. Low nl RV fxn.   . Noncompliance with medications   . PAF (paroxysmal atrial fibrillation) ([place])    a. 07/2017 afib->broke w/ IV amio->converted to oral bb due to prior intol to oral amio.  . Pulmonary hypertension ([place])   . Stroke [facility])    a. 06/2018 MRI/A Brain: R paramedian pons late acute/early subacute infarct, 1mm. No assoc hemorrhage or mass effect.  Sev chronic microvascular isch changtes and mod voluem loss. No large vessel occlusion,  aneurysm, or significant stenosis.    Medications:  Medications Prior to Admission  Medication Sig Dispense Refill Last Dose  . furosemide (LASIX) 40 MG tablet Take 1/2 tablet (20 mg) by mouth once daily   Past Week at Unknown time  . losartan (COZAAR) 100 MG tablet Take 100 mg by mouth daily.   Past Week at Unknown time  . metoprolol succinate (TOPROL-XL) 50 MG 24 hr tablet Take 50 mg by mouth daily.   Past Week at Unknown time  . potassium chloride SA (K-DUR,KLOR-CON) 20 MEQ tablet Take 20 mEq by mouth daily.    Past Week at Unknown time  . spironolactone (ALDACTONE) 25 MG tablet Take 25 mg by mouth daily.   Past Week at Unknown time  . rivaroxaban (XARELTO) 20 MG TABS tablet Take 1 tablet (20 mg total) by mouth daily with supper. (Patient not taking: Reported on 03/31/2018) 30 tablet 6 Not Taking   Scheduled:  . apixaban  5 mg Oral BID  . furosemide  20 mg Oral Daily  . losartan  100 mg Oral Daily  . metoprolol succinate  50 mg Oral Daily  . pantoprazole  40 mg Oral Q0600  . potassium chloride SA  20 mEq Oral Daily  . spironolactone  25 mg Oral Daily   Infusions:   PRN: acetaminophen **OR** acetaminophen (TYLENOL) oral liquid 160 mg/5 mL **OR** acetaminophen  Assessment: Pharmacy was consulted to initate  apixaban for afib by Dr. Benjie Karvonen. CHADSVASc > 4. Hgb/plt stable. Pt has a hx of GI bleed per notes.    Goal of Therapy:  Monitor platelets by anticoagulation protocol: Yes   Plan:  Will start a dose of apixaban 5 mg BID. Continue to monitor hgb/plt and any signs/symptoms of bleeding.   Oswald Hillock, PharmD, BCPS 07/01/2018,1:57 PM

## 2018-07-01 NOTE — Progress Notes (Signed)
[place] [place] NAME: Nathan [name]    MR#:  [id number]  DATE OF BIRTH:  10-04-1950  SUBJECTIVE:  Patient with left sided weakness  REVIEW OF SYSTEMS:    Review of Systems  Constitutional: Negative for fever, chills weight loss HENT: Negative for ear pain, nosebleeds, congestion, facial swelling, rhinorrhea, neck pain, neck stiffness and ear discharge.   Respiratory: Negative for cough, shortness of breath, wheezing  Cardiovascular: Negative for chest pain, palpitations and leg swelling.  Gastrointestinal: Negative for heartburn, abdominal pain, vomiting, diarrhea or consitpation Genitourinary: Negative for dysuria, urgency, frequency, hematuria Musculoskeletal: Negative for back pain or joint pain Neurological: ++left sided weakness and facial droop Hematological: Does not bruise/bleed easily.  Psychiatric/Behavioral: Negative for hallucinations, confusion, dysphoric mood    Tolerating Diet: yes      DRUG ALLERGIES:  No Known Allergies  VITALS:  Blood pressure 122/85, pulse 65, temperature (!) 97.5 F (36.4 C), temperature source Oral, resp. rate 18, height 6\' 3"  (1.905 m), weight 101.6 kg, SpO2 96 %.  PHYSICAL EXAMINATION:  Constitutional: Appears well-developed and well-nourished. No distress. HENT: Normocephalic. Marland Kitchen Oropharynx is clear and moist.  Eyes: Conjunctivae and EOM are normal. PERRLA, no scleral icterus.  Neck: Normal ROM. Neck supple. No JVD. No tracheal deviation. CVS: RRR, S1/S2 +, no murmurs, no gallops, no carotid bruit.  Pulmonary: Effort and breath sounds normal, no stridor, rhonchi, wheezes, rales.  Abdominal: Soft. BS +,  no distension, tenderness, rebound or guarding.  Musculoskeletal: Normal range of motion. No edema and no tenderness.  Neuro: Alert. Left facial droop with hemiplegia left side Skin: Skin is warm and dry. No rash noted. Psychiatric: Normal mood and affect.      LABORATORY PANEL:    CBC Recent Labs  Lab 06/30/18 1501  WBC 5.3  HGB 12.5*  HCT 38.1*  PLT 264   ------------------------------------------------------------------------------------------------------------------  Chemistries  Recent Labs  Lab 06/30/18 1501  NA 140  K 4.4  CL 106  CO2 23  GLUCOSE 116*  BUN 22  CREATININE 1.41*  CALCIUM 9.7  AST 17  ALT 13  ALKPHOS 53  BILITOT 0.4   ------------------------------------------------------------------------------------------------------------------  Cardiac Enzymes Recent Labs  Lab 06/30/18 1501  TROPONINI <0.03   ------------------------------------------------------------------------------------------------------------------  RADIOLOGY:  Ct Head Wo Contrast  Result Date: 06/30/2018 CLINICAL DATA:  Left-sided weakness and right-sided facial droop EXAM: CT HEAD WITHOUT CONTRAST TECHNIQUE: Contiguous axial images were obtained from the base of the skull through the vertex without intravenous contrast. COMPARISON:  February 07, 2018 FINDINGS: Brain: Mild diffuse atrophy is stable. There is no intracranial mass, hemorrhage, extra-axial fluid collection, or midline shift. There is small vessel disease throughout the centra semiovale bilaterally. Small vessel disease is noted in each internal capsule. There is no demonstrable acute infarct. Vascular: There is no appreciable hyperdense vessel. There is calcification in each distal vertebral artery and carotid siphon region. Skull: The bony calvarium appears intact. Sinuses/Orbits: There is mucosal thickening in several ethmoid air cells. Other visualized paranasal sinuses are clear. Orbits appear symmetric bilaterally except for evidence of previous cataract removal on the left. Other: Mastoid air cells are clear. There is debris in each external auditory canal. IMPRESSION: Atrophy with fairly extensive periventricular small vessel disease. Small vessel disease noted in each internal capsule region. No  acute infarct. No mass or hemorrhage. There are foci of arterial vascular calcification. There is mucosal thickening in several ethmoid air cells. There is probable cerumen in each external auditory  canal. Electronically Signed   By: Lowella Grip III M.D.   On: 06/30/2018 15:22   Mr Brain Wo Contrast  Result Date: 06/30/2018 CLINICAL DATA:  68 y/o M; left-sided weakness, facial droop, slurred speech. EXAM: MRI HEAD WITHOUT CONTRAST MRA HEAD WITHOUT CONTRAST TECHNIQUE: Multiplanar, multiecho pulse sequences of the brain and surrounding structures were obtained without intravenous contrast. Angiographic images of the head were obtained using MRA technique without contrast. COMPARISON:  06/30/2018 CT head. FINDINGS: MRI HEAD FINDINGS Brain: Reduced diffusion within right paramedian pons measuring 22 x 13 x 10 mm (AP x ML x CC series 2, image 20 and series 4, image 19) compatible with acute/early subacute infarction. No associated hemorrhage or mass effect. The infarct is T2 FLAIR hyperintense. Large confluent nonspecific T2 FLAIR hyperintensities in subcortical and periventricular white matter are compatible with severe chronic microvascular ischemic changes. Moderate volume loss of the brain. Motion degraded susceptibility weighted sequence. No mass effect, extra-axial collection, hydrocephalus, or herniation. Vascular: Normal flow voids. Skull and upper cervical spine: Normal marrow signal. Sinuses/Orbits: Negative. Other: None. MRA HEAD FINDINGS Internal carotid arteries: Patent. Prominent infundibulum of the right inferolateral trunk noted. Anterior cerebral arteries:  Patent. Middle cerebral arteries: Patent. Anterior communicating artery: Patent. Posterior communicating arteries: Not identified, likely hypoplastic or absent. Posterior cerebral arteries:  Patent.  Mild right P2 stenosis. Basilar artery:  Patent. Vertebral arteries: The right vertebral artery V3/V4 junction is poorly visualized, however  this is likely due to artifact which is visible throughout that level in the neck. Left dominant vertebrobasilar system. No evidence of high-grade stenosis, large vessel occlusion, or aneurysm. Lumen irregularity of carotid siphons is compatible with intracranial atherosclerosis. IMPRESSION: 1. Right paramedian pons late acute/early subacute infarction measuring up to 22 mm. No associated hemorrhage or mass effect. 2. Severe chronic microvascular ischemic changes and moderate volume loss of the brain. 3. Patent anterior and posterior intracranial circulation. No large vessel occlusion, aneurysm, or significant stenosis is identified. These results will be called to the ordering clinician or representative by the Radiologist Assistant, and communication documented in the PACS or zVision Dashboard. Electronically Signed   By: Kristine Garbe M.D.   On: 06/30/2018 22:35   US Carotid Bilateral (at [facility] Only)  Result Date: 07/01/2018 CLINICAL DATA:  Acute right-sided pontine infarct. EXAM: BILATERAL CAROTID DUPLEX ULTRASOUND TECHNIQUE: Pearline Cables scale imaging, color Doppler and duplex ultrasound were performed of bilateral carotid and vertebral arteries in the neck. COMPARISON:  None. FINDINGS: Criteria: Quantification of carotid stenosis is based on velocity parameters that correlate the residual internal carotid diameter with NASCET-based stenosis levels, using the diameter of the distal internal carotid lumen as the denominator for stenosis measurement. The following velocity measurements were obtained: RIGHT ICA:  54/22 cm/sec CCA:  27/03 cm/sec SYSTOLIC ICA/CCA RATIO:  0.7 ECA:  55 cm/sec LEFT ICA:  66/18 cm/sec CCA:  50/09 cm/sec SYSTOLIC ICA/CCA RATIO:  0.9 ECA:  49 cm/sec RIGHT CAROTID ARTERY: Mild scattered calcified plaque is present at the level of the common carotid artery, carotid bulb and proximal right internal carotid artery. Estimated right ICA stenosis is less than 50%. RIGHT VERTEBRAL  ARTERY: Antegrade flow with normal waveform and velocity. LEFT CAROTID ARTERY: Mild scattered calcified plaque is present at the level of the distal common carotid artery common carotid bulb and left ICA origin. Estimated left ICA stenosis is less than 50%. LEFT VERTEBRAL ARTERY: Antegrade flow with normal waveform and velocity. IMPRESSION: Mild scattered calcified plaque at the level of bilateral  common carotid arteries, carotid bulbs and internal carotid arteries. No significant ICA stenosis identified with estimated bilateral ICA stenoses of less than 50%. Electronically Signed   By: Aletta Edouard M.D.   On: 07/01/2018 09:07   Dg Chest Portable 1 View  Result Date: 06/30/2018 CLINICAL DATA:  Syncope with heart block EXAM: PORTABLE CHEST 1 VIEW COMPARISON:  February 09, 2018 FINDINGS: No edema or consolidation. Heart size and pulmonary vascularity are normal. No adenopathy. There is aortic atherosclerosis. No pneumothorax. No bone lesions. IMPRESSION: No edema or consolidation. Heart size within normal limits. Aortic Atherosclerosis (ICD10-I70.0). Electronically Signed   By: Lowella Grip III M.D.   On: 06/30/2018 17:40   Mr Jodene Nam Head/brain VZ Cm  Result Date: 06/30/2018 CLINICAL DATA:  68 y/o M; left-sided weakness, facial droop, slurred speech. EXAM: MRI HEAD WITHOUT CONTRAST MRA HEAD WITHOUT CONTRAST TECHNIQUE: Multiplanar, multiecho pulse sequences of the brain and surrounding structures were obtained without intravenous contrast. Angiographic images of the head were obtained using MRA technique without contrast. COMPARISON:  06/30/2018 CT head. FINDINGS: MRI HEAD FINDINGS Brain: Reduced diffusion within right paramedian pons measuring 22 x 13 x 10 mm (AP x ML x CC series 2, image 20 and series 4, image 19) compatible with acute/early subacute infarction. No associated hemorrhage or mass effect. The infarct is T2 FLAIR hyperintense. Large confluent nonspecific T2 FLAIR hyperintensities in  subcortical and periventricular white matter are compatible with severe chronic microvascular ischemic changes. Moderate volume loss of the brain. Motion degraded susceptibility weighted sequence. No mass effect, extra-axial collection, hydrocephalus, or herniation. Vascular: Normal flow voids. Skull and upper cervical spine: Normal marrow signal. Sinuses/Orbits: Negative. Other: None. MRA HEAD FINDINGS Internal carotid arteries: Patent. Prominent infundibulum of the right inferolateral trunk noted. Anterior cerebral arteries:  Patent. Middle cerebral arteries: Patent. Anterior communicating artery: Patent. Posterior communicating arteries: Not identified, likely hypoplastic or absent. Posterior cerebral arteries:  Patent.  Mild right P2 stenosis. Basilar artery:  Patent. Vertebral arteries: The right vertebral artery V3/V4 junction is poorly visualized, however this is likely due to artifact which is visible throughout that level in the neck. Left dominant vertebrobasilar system. No evidence of high-grade stenosis, large vessel occlusion, or aneurysm. Lumen irregularity of carotid siphons is compatible with intracranial atherosclerosis. IMPRESSION: 1. Right paramedian pons late acute/early subacute infarction measuring up to 22 mm. No associated hemorrhage or mass effect. 2. Severe chronic microvascular ischemic changes and moderate volume loss of the brain. 3. Patent anterior and posterior intracranial circulation. No large vessel occlusion, aneurysm, or significant stenosis is identified. These results will be called to the ordering clinician or representative by the Radiologist Assistant, and communication documented in the PACS or zVision Dashboard. Electronically Signed   By: Kristine Garbe M.D.   On: 06/30/2018 22:35     ASSESSMENT AND PLAN:   68 year old male with history of atrial flutter, combined chronic systolic and diastolic heart failure and hypertension who presented to the emergency  room due to left-sided weakness.   1. Right paramedian pons late acute/early subacute infarction measuring up to 22 mm. No associated hemorrhage or mass effect causing left-sided hemiplegia and left facial droop: Given patient's history of atrial flutter he was started on anticoagulation. PT, OT and speech consultation Neurology consultation Carotid Doppler without significant stenosis Echocardiogram pending LDL 121 Start statin therapy with goal LDL less than 70  2.  Combined chronic systolic and diastolic heart failure ejection fraction 35 to 40%: Patient is euvolemic Continue Lasix, Aldactone and metoprolol Follow-up  on today's echo 3.  History of atrial flutter status post ablation in the past: Cardiology consultation requested and discussed with Dr. Penni Homans.  Patient will start on anticoagulation.  Patient is currently in normal sinus rhythm. Continue metoprolol for heart rate control  4.  Essential hypertension: Continue metoprolol, Aldactone and losartan   Management plans discussed with the patient and he is in agreement.  CODE STATUS: Full  TOTAL TIME TAKING CARE OF THIS PATIENT: 30 minutes.     POSSIBLE D/C tomorrow, DEPENDING ON CLINICAL CONDITION.   Bettey Costa M.D on 07/01/2018 at 11:25 AM  Between 7am to 6pm - Pager - [phone] After 6pm go to [email or web address] - password [email or web address] [facility]  Office  [phone]  CC: Primary care physician; Patient, [name]  Note: This dictation was prepared with Dragon dictation along with smaller phrase technology. Any transcriptional errors that result from this process are unintentional.

## 2018-07-01 NOTE — Progress Notes (Signed)
Inpatient Rehabilitation-Admissions Coordinator   Spoke with pt via phone regarding CIR program. [facility] reviewed expectations, program details, estimated LOS, and anticipated assist level at DC. Pt interested in the program but wants to speak with his family first before deciding on a venue.   With permission, AC spoke with his fiance, Katharine Look to discuss caregiver support. Katharine Look reports she is unable to provide any physical assistance for the pt besides cook. When asked additional questions for clarification, she asked if we could speak tomorrow as she had company over and ended the conversation.   Per pt request, AC will check insurance benefits to address possible cost of CIR. Will follow up with pt tomorrow to determine interest after benefits check.  Jhonnie Garner, OTR/L  Rehab Admissions Coordinator  [phone] 07/01/2018 5:05 PM

## 2018-07-01 NOTE — Progress Notes (Signed)
Occupational Therapy Treatment Patient Details Name: Nathan [name]. MRN: [id number] DOB: October 19, 1950 Today's Date: 07/01/2018    History of present illness Nathan [name]  is a 68 y.o. male presents to [facility] 4/14 c L-sided weakness and facial droop x2 days. MRI + for "right paramedian pons late acute/early subacute infarction measuring up to 22 mm. No associated hemorrhage or mass effect" as well as severe chronic microvascular ischemic changes. 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 PTA   OT comments  Pt seen for afternoon OT tx session. After being up in the recliner for some period of time, pt looking forward to returning to the bed for some rest. Pt required Min A +2 with hemiwalker on R side for STS and SPT with L knee blocked in case of buckling but no buckling noted. Cues for sequencing and hand/foot placement during transitional movements. Pt did not require any physical assist for LLE during transfer. LUE supported by therapist for additional weightbearing throughout mobility. LUE repositioned on pillow in bed for optimal skin and joint protection. Continue to recommend high intensity acute skilled OT services - CIR at discharge.    Follow Up Recommendations  CIR    Equipment Recommendations       Recommendations for Other Services Rehab consult    Precautions / Restrictions Precautions Precautions: Fall Restrictions Weight Bearing Restrictions: No       Mobility Bed Mobility Overal bed mobility: Needs Assistance Bed Mobility: Sit to Supine       Sit to supine: Min assist;Mod assist   General bed mobility comments: min-mod assist for LLE mgt back to bed  Transfers Overall transfer level: Needs assistance Equipment used: Hemi-walker Transfers: Sit to/from [employer] Transfers Sit to Stand: +2 physical assistance;Min assist Stand pivot transfers: Min assist;+2  physical assistance       General transfer comment: cues for hand placement, sequencing with hemiwalker, no buckling of the L knee noted    Balance Overall balance assessment: Needs assistance Sitting-balance support: Single extremity supported;Feet supported Sitting balance-Leahy Scale: Fair     Standing balance support: Single extremity supported;During functional activity Standing balance-Leahy Scale: Poor Standing balance comment: requires RUE support on hemiwalker                           ADL either performed or assessed with clinical judgement   ADL                                               Vision       Perception     Praxis      Cognition Arousal/Alertness: Awake/alert Behavior During Therapy: WFL for tasks assessed/performed Overall Cognitive Status: Within Functional Limits for tasks assessed                                          Exercises     Shoulder Instructions       General Comments LUE positioned on pillow once back in the bed    Pertinent Vitals/ Pain       Pain Assessment: No/denies pain  Home Living  Prior Functioning/Environment              Frequency  Min 3X/week        Progress Toward Goals  OT Goals(current goals can now be found in the care plan section)  Progress towards OT goals: Progressing toward goals  Acute Rehab OT Goals Patient Stated Goal: Regain independent transfers and AMB  OT Goal Formulation: With patient Time For Goal Achievement: 07/15/18 Potential to Achieve Goals: Good  Plan Discharge plan remains appropriate;Frequency remains appropriate    Co-evaluation                 AM-PAC OT "6 Clicks" Daily Activity     Outcome Measure   Help from another person eating meals?: A Little Help from another person taking care of personal grooming?: A Little Help from another person  toileting, which includes using toliet, bedpan, or urinal?: A Lot Help from another person bathing (including washing, rinsing, drying)?: A Lot Help from another person to put on and taking off regular upper body clothing?: A Little Help from another person to put on and taking off regular lower body clothing?: A Lot 6 Click Score: 15    End of Session Equipment Utilized During Treatment: Gait belt;Other (comment)(hemiwalker)  OT Visit Diagnosis: Other symptoms and signs involving the nervous system (R29.898);Hemiplegia and hemiparesis Hemiplegia - Right/Left: Left Hemiplegia - dominant/non-dominant: Dominant Hemiplegia - caused by: Cerebral infarction   Activity Tolerance Patient tolerated treatment well   Patient Left in bed;with bed alarm set;with call bell/phone within reach   Nurse Communication          Time: 7116-5790 OT Time Calculation (min): 14 min  Charges: OT General Charges $OT Visit: 1 Visit OT Treatments $Therapeutic Activity: 8-22 mins  Jeni Salles, MPH, MS, OTR/L ascom [phone] 07/01/18, 4:17 PM

## 2018-07-01 NOTE — Consult Note (Addendum)
Cardiology Consult    Patient ID: Nathan [name]. MRN: [id number], DOB/AGE: 1950-12-01   Admit date: 06/30/2018 Date of Consult: 07/01/2018  Primary Physician: Patient, [name] Primary Cardiologist: Virl Axe, MD Requesting Provider: Gardiner Coins, MD  Patient Profile    Nathan Bushey. is a 68 y.o. male with a history of atrial flutter s/p ablation (06/2017), PAF (07/2017), NICM/HFrEF (EF 35-40% 08/2017), HTN, PAH, noncompliance, and GIB (01/2018), who is being seen today for the evaluation of stroke at the request of Dr. Benjie Karvonen.  Past Medical History   Past Medical History:  Diagnosis Date  . Atrial flutter ([place])    a. s/p TEE/DCCV 04/2017; b. 06/2017 s/p RFCA; c. CHADS2VASc => 5 (CHF, HTN, age x 1, CVA)-->noncompliant with Xarelto.  . Chronic combined systolic and diastolic CHF (congestive heart failure) ([place])    a. TTE 2/19: EF 20-25%, diffuse HK, mod dilated LA, mildly reduced RVSF, PASP 42; b. TTE 3/19: EF 20-25%, diffuse HK, RVSF nl, atrial flutter with RVR; c. 08/2017 Echo: EF 35-40%, diff HK. Gr1 DD. mildly dil LA. Low nl RV fxn.   . Essential hypertension   . GI bleed    a.  Hemorrhagic shock in 11/19 secondary to erosive gastropathy and Barrett's esophagus requiring multiple units PRBCs; b. Cleared to resume OAC->pt did not.  Marland Kitchen NICM (nonischemic cardiomyopathy) ([place])    a. 04/2017: Echo EF 20-25%; b. TTE 3/19: EF 20-25%; c. 07/2017 Cath: min irregs; d. 08/2017 Echo: EF 35-40%, diff HK. Gr1 DD. mildly dil LA. Low nl RV fxn.   . Noncompliance with medications   . PAF (paroxysmal atrial fibrillation) ([place])    a. 07/2017 afib->broke w/ IV amio->converted to oral bb due to prior intol to oral amio.  . Pulmonary hypertension ([place])   . Stroke [facility])    a. 06/2018 MRI/A Brain: R paramedian pons late acute/early subacute infarct, 63mm. No assoc hemorrhage or mass effect.  Sev chronic microvascular isch changtes and mod voluem loss. No large vessel occlusion, aneurysm, or significant stenosis.     Past Surgical History:  Procedure Laterality Date  . A-FLUTTER ABLATION N/A 06/16/2017   Procedure: A-FLUTTER ABLATION;  Surgeon: [name], MD;  Location: [place] [facility];  Service: Cardiovascular;  Laterality: N/A;  . CARDIAC CATHETERIZATION    . CARDIOVERSION N/A 05/01/2017   Procedure: CARDIOVERSION;  Surgeon: [name], MD;  Location: [facility] ORS;  Service: Cardiovascular;  Laterality: N/A;  . CORONARY ANGIOPLASTY    . ESOPHAGOGASTRODUODENOSCOPY (EGD) WITH PROPOFOL N/A 02/09/2018   Procedure: ESOPHAGOGASTRODUODENOSCOPY (EGD) WITH PROPOFOL;  Surgeon: [name], MD;  Location: [facility] ENDOSCOPY;  Service: Endoscopy;  Laterality: N/A;  . RIGHT/LEFT HEART CATH AND CORONARY ANGIOGRAPHY N/A 07/18/2017   Procedure: RIGHT/LEFT HEART CATH AND CORONARY ANGIOGRAPHY;  Surgeon: [name], MD;  Location: [place] [facility];  Service: Cardiovascular;  Laterality: N/A;  . TEE WITHOUT CARDIOVERSION N/A 05/01/2017   Procedure: TRANSESOPHAGEAL ECHOCARDIOGRAM (TEE);  Surgeon: [name], MD;  Location: [facility] ORS;  Service: Cardiovascular;  Laterality: N/A;  . TEE WITHOUT CARDIOVERSION N/A 06/16/2017   Procedure: TRANSESOPHAGEAL ECHOCARDIOGRAM (TEE);  Surgeon: [name], MD;  Location: [facility] ENDOSCOPY;  Service: Cardiovascular;  Laterality: N/A;     Allergies  No Known Allergies  History of Present Illness    68 y/o ? with the above complex PMH including atrial flutter and fibrillation, HTN, noncompliance, NICM, HFrEF, and GIB.  He was admitted to [facility] in 04/2017 w/ new onset  atrial flutter and CHF w/ an EF of 20-25%.  He was placed on xarelto and underwent TEE and DCCV.  Unfortunately, he could not afford xarelto.  He was readmitted in late 05/2017, once again in atrial flutter.  Warfarin was initiated and he was subsequently tx to [facility] for EP eval + TEE (EF 15-20%) and catheter ablation, which was successful, though he was noted to have intermittent atrial tachycardia  post-procedure and he was started on amiodarone.  Warfarin was transitioned back to xarelto prior to discharge.  Amio ended up being d/c'd due to intolerance (nausea and diarrhea).  He was readmitted 07/2017 w/ recurrent atrial fibrillation, which converted on IV amio and he was maintained on oral  blocker.  Cath was carried out due to ongoing LV dysfxn, and this showed only minor irregularities.  He has since maintained sinus rhythm but required admission in 01/2018 2/2 hemorrhagic shock (Hgb 4.7) and massive GIB.  He required multiple units of PRBCs and was not able to undergo colonoscopy due to an inability to tolerate bowel prep.  EGD showed erosive gastropathy and Barrett's esophagus.  He was cleared by GI to resume [facility] @ discharge but he never did.  He was last seen in cardiology clinic in January and was advised to resume xarelto therapy, however, he did not out of ongoing concerns for recurrent GIB.  He lives locally and has continued to work at [employer], though in the setting of the Covid 19 pandemic, he has been laid off and he is now applying for unemployment.  He was in his usoh until the morning of 4/13, when he had sudden onset of left sided wkns, facial droop, and slurred speech.  As symptoms persisted, he eventually presented to [facility] ED on 4/14.  CT head was negative, however, MRI/A has shown R paramedian pons late acute/early subacute infarct, 74mm.  He has been in sinus rhythm since admission.  He offers no cardiac complaints.  We have been asked to evaluate given h/o afib/flutter and noncompliance w/ [place] following GIB in Nov 2019.  Inpatient Medications    . enoxaparin (LOVENOX) injection  40 mg Subcutaneous Q24H  . furosemide  20 mg Oral Daily  . losartan  100 mg Oral Daily  . metoprolol succinate  50 mg Oral Daily  . potassium chloride SA  20 mEq Oral Daily  . spironolactone  25 mg Oral Daily    Family History    Family History  Problem Relation Age of Onset  .  Alzheimer's disease Mother   . Alzheimer's disease Father    He indicated that the status of his mother is unknown. He indicated that the status of his father is unknown.   Social History    Social History   Socioeconomic History  . Marital status: Single    Spouse name: Not on file  . Number of children: 1  . Years of education: Not on file  . Highest education level: Not on file  Occupational History  . Occupation: [employer]    Comment: Theatre manager  Social Needs  . Financial resource strain: Not hard at all  . Food insecurity:    Worry: Never true    Inability: Never true  . Transportation needs:    Medical: Yes    Non-medical: Yes  Tobacco Use  . Smoking status: Former Smoker    Last attempt to quit: 04/21/2017    Years since quitting: 1.1  . Smokeless tobacco: Never Used  Substance and Sexual Activity  . Alcohol use: Yes    Alcohol/week: 3.0 standard drinks    Types: 3 Cans of beer per week    Frequency: Never    Comment: Drink a half of a 40oz beer every day, drank heavily in the past  . Drug use: Never  . Sexual activity: Yes    Partners: Female  Lifestyle  . Physical activity:    Days per week: 7 days    Minutes per session: 20 min  . Stress: Not at all  Relationships  . Social connections:    Talks on phone: More than three times a week    Gets together: Once a week    Attends religious service: 1 to 4 times per year    Active member of club or organization: No    Attends meetings of clubs or organizations: Never    Relationship status: Divorced  . Intimate partner violence:    Fear of current or ex partner: No    Emotionally abused: No    Physically abused: No    Forced sexual activity: No  Other Topics Concern  . Not on file  Social History Narrative  . Not on file     Review of Systems    General:  No chills, fever, night sweats or weight changes.  Cardiovascular:  No chest pain, dyspnea on exertion, edema, orthopnea, palpitations, paroxysmal  nocturnal dyspnea. Dermatological: No rash, lesions/masses Respiratory: No cough, dyspnea Urologic: No hematuria, dysuria Abdominal:   No nausea, vomiting, diarrhea, bright red blood per rectum, melena, or hematemesis Neurologic:  +++ left sided wkns, +++ facial droop, +++ slurring of speech, no visual changes, changes in mental status. All other systems reviewed and are otherwise negative except as noted above.  Physical Exam    Blood pressure 122/85, pulse 65, temperature (!) 97.5 F (36.4 C), temperature source Oral, resp. rate 18, height 6\' 3"  (1.905 m), weight 101.6 kg, SpO2 96 %.  General: Pleasant, NAD Psych: Normal affect. Neuro: Alert and oriented X 3. L facial droop. LUE 1/5, LLE 2/5. HEENT: left facial droop.  Neck: Supple without bruits or JVD. Lungs:  Resp regular and unlabored, scattered rhonchi. Heart: RRR no s3, s4, or murmurs. Abdomen: Soft, non-tender, non-distended, BS + x 4.  Extremities: No clubbing, cyanosis or edema. DP/PT/Radials 2+ and equal bilaterally.  Labs    Troponin  Recent Labs    06/30/18 1501  TROPONINI <0.03   Lab Results  Component Value Date   WBC 5.3 06/30/2018   HGB 12.5 (L) 06/30/2018   HCT 38.1 (L) 06/30/2018   MCV 80.7 06/30/2018   PLT 264 06/30/2018    Recent Labs  Lab 06/30/18 1501  NA 140  K 4.4  CL 106  CO2 23  BUN 22  CREATININE 1.41*  CALCIUM 9.7  PROT 8.4*  BILITOT 0.4  ALKPHOS 53  ALT 13  AST 17  GLUCOSE 116*   Lab Results  Component Value Date   CHOL 198 07/01/2018   HDL 55 07/01/2018   LDLCALC 121 (H) 07/01/2018   TRIG 112 07/01/2018     Radiology Studies    Ct Head Wo Contrast  Result Date: 06/30/2018 CLINICAL DATA:  Left-sided weakness and right-sided facial droop EXAM: CT HEAD WITHOUT CONTRAST TECHNIQUE: Contiguous axial images were obtained from the base of the skull through the vertex without intravenous contrast. COMPARISON:  February 07, 2018 FINDINGS: Brain: Mild diffuse atrophy is stable.  There is no intracranial mass, hemorrhage,  extra-axial fluid collection, or midline shift. There is small vessel disease throughout the centra semiovale bilaterally. Small vessel disease is noted in each internal capsule. There is no demonstrable acute infarct. Vascular: There is no appreciable hyperdense vessel. There is calcification in each distal vertebral artery and carotid siphon region. Skull: The bony calvarium appears intact. Sinuses/Orbits: There is mucosal thickening in several ethmoid air cells. Other visualized paranasal sinuses are clear. Orbits appear symmetric bilaterally except for evidence of previous cataract removal on the left. Other: Mastoid air cells are clear. There is debris in each external auditory canal. IMPRESSION: Atrophy with fairly extensive periventricular small vessel disease. Small vessel disease noted in each internal capsule region. No acute infarct. No mass or hemorrhage. There are foci of arterial vascular calcification. There is mucosal thickening in several ethmoid air cells. There is probable cerumen in each external auditory canal. Electronically Signed   By: Lowella Grip III M.D.   On: 06/30/2018 15:22   Mr Brain Wo Contrast  Result Date: 06/30/2018 CLINICAL DATA:  68 y/o M; left-sided weakness, facial droop, slurred speech. EXAM: MRI HEAD WITHOUT CONTRAST MRA HEAD WITHOUT CONTRAST TECHNIQUE: Multiplanar, multiecho pulse sequences of the brain and surrounding structures were obtained without intravenous contrast. Angiographic images of the head were obtained using MRA technique without contrast. COMPARISON:  06/30/2018 CT head. FINDINGS: MRI HEAD FINDINGS Brain: Reduced diffusion within right paramedian pons measuring 22 x 13 x 10 mm (AP x ML x CC series 2, image 20 and series 4, image 19) compatible with acute/early subacute infarction. No associated hemorrhage or mass effect. The infarct is T2 FLAIR hyperintense. Large confluent nonspecific T2 FLAIR  hyperintensities in subcortical and periventricular white matter are compatible with severe chronic microvascular ischemic changes. Moderate volume loss of the brain. Motion degraded susceptibility weighted sequence. No mass effect, extra-axial collection, hydrocephalus, or herniation. Vascular: Normal flow voids. Skull and upper cervical spine: Normal marrow signal. Sinuses/Orbits: Negative. Other: None. MRA HEAD FINDINGS Internal carotid arteries: Patent. Prominent infundibulum of the right inferolateral trunk noted. Anterior cerebral arteries:  Patent. Middle cerebral arteries: Patent. Anterior communicating artery: Patent. Posterior communicating arteries: Not identified, likely hypoplastic or absent. Posterior cerebral arteries:  Patent.  Mild right P2 stenosis. Basilar artery:  Patent. Vertebral arteries: The right vertebral artery V3/V4 junction is poorly visualized, however this is likely due to artifact which is visible throughout that level in the neck. Left dominant vertebrobasilar system. No evidence of high-grade stenosis, large vessel occlusion, or aneurysm. Lumen irregularity of carotid siphons is compatible with intracranial atherosclerosis. IMPRESSION: 1. Right paramedian pons late acute/early subacute infarction measuring up to 22 mm. No associated hemorrhage or mass effect. 2. Severe chronic microvascular ischemic changes and moderate volume loss of the brain. 3. Patent anterior and posterior intracranial circulation. No large vessel occlusion, aneurysm, or significant stenosis is identified. These results will be called to the ordering clinician or representative by the Radiologist Assistant, and communication documented in the PACS or zVision Dashboard. Electronically Signed   By: Kristine Garbe M.D.   On: 06/30/2018 22:35   US Carotid Bilateral (at [facility] And [facility] Only)  Result Date: 07/01/2018 CLINICAL DATA:  Acute right-sided pontine infarct. EXAM: BILATERAL CAROTID DUPLEX  ULTRASOUND TECHNIQUE: Pearline Cables scale imaging, color Doppler and duplex ultrasound were performed of bilateral carotid and vertebral arteries in the neck. COMPARISON:  None. FINDINGS: Criteria: Quantification of carotid stenosis is based on velocity parameters that correlate the residual internal carotid diameter with NASCET-based stenosis levels, using the diameter of  the distal internal carotid lumen as the denominator for stenosis measurement. The following velocity measurements were obtained: RIGHT ICA:  54/22 cm/sec CCA:  27/06 cm/sec SYSTOLIC ICA/CCA RATIO:  0.7 ECA:  55 cm/sec LEFT ICA:  66/18 cm/sec CCA:  23/76 cm/sec SYSTOLIC ICA/CCA RATIO:  0.9 ECA:  49 cm/sec RIGHT CAROTID ARTERY: Mild scattered calcified plaque is present at the level of the common carotid artery, carotid bulb and proximal right internal carotid artery. Estimated right ICA stenosis is less than 50%. RIGHT VERTEBRAL ARTERY: Antegrade flow with normal waveform and velocity. LEFT CAROTID ARTERY: Mild scattered calcified plaque is present at the level of the distal common carotid artery common carotid bulb and left ICA origin. Estimated left ICA stenosis is less than 50%. LEFT VERTEBRAL ARTERY: Antegrade flow with normal waveform and velocity. IMPRESSION: Mild scattered calcified plaque at the level of bilateral common carotid arteries, carotid bulbs and internal carotid arteries. No significant ICA stenosis identified with estimated bilateral ICA stenoses of less than 50%. Electronically Signed   By: Aletta Edouard M.D.   On: 07/01/2018 09:07   Dg Chest Portable 1 View  Result Date: 06/30/2018 CLINICAL DATA:  Syncope with heart block EXAM: PORTABLE CHEST 1 VIEW COMPARISON:  February 09, 2018 FINDINGS: No edema or consolidation. Heart size and pulmonary vascularity are normal. No adenopathy. There is aortic atherosclerosis. No pneumothorax. No bone lesions. IMPRESSION: No edema or consolidation. Heart size within normal limits. Aortic  Atherosclerosis (ICD10-I70.0). Electronically Signed   By: Lowella Grip III M.D.   On: 06/30/2018 17:40   Mr Jodene Nam Head/brain EG Cm  Result Date: 06/30/2018 CLINICAL DATA:  68 y/o M; left-sided weakness, facial droop, slurred speech. EXAM: MRI HEAD WITHOUT CONTRAST MRA HEAD WITHOUT CONTRAST TECHNIQUE: Multiplanar, multiecho pulse sequences of the brain and surrounding structures were obtained without intravenous contrast. Angiographic images of the head were obtained using MRA technique without contrast. COMPARISON:  06/30/2018 CT head. FINDINGS: MRI HEAD FINDINGS Brain: Reduced diffusion within right paramedian pons measuring 22 x 13 x 10 mm (AP x ML x CC series 2, image 20 and series 4, image 19) compatible with acute/early subacute infarction. No associated hemorrhage or mass effect. The infarct is T2 FLAIR hyperintense. Large confluent nonspecific T2 FLAIR hyperintensities in subcortical and periventricular white matter are compatible with severe chronic microvascular ischemic changes. Moderate volume loss of the brain. Motion degraded susceptibility weighted sequence. No mass effect, extra-axial collection, hydrocephalus, or herniation. Vascular: Normal flow voids. Skull and upper cervical spine: Normal marrow signal. Sinuses/Orbits: Negative. Other: None. MRA HEAD FINDINGS Internal carotid arteries: Patent. Prominent infundibulum of the right inferolateral trunk noted. Anterior cerebral arteries:  Patent. Middle cerebral arteries: Patent. Anterior communicating artery: Patent. Posterior communicating arteries: Not identified, likely hypoplastic or absent. Posterior cerebral arteries:  Patent.  Mild right P2 stenosis. Basilar artery:  Patent. Vertebral arteries: The right vertebral artery V3/V4 junction is poorly visualized, however this is likely due to artifact which is visible throughout that level in the neck. Left dominant vertebrobasilar system. No evidence of high-grade stenosis, large vessel  occlusion, or aneurysm. Lumen irregularity of carotid siphons is compatible with intracranial atherosclerosis. IMPRESSION: 1. Right paramedian pons late acute/early subacute infarction measuring up to 22 mm. No associated hemorrhage or mass effect. 2. Severe chronic microvascular ischemic changes and moderate volume loss of the brain. 3. Patent anterior and posterior intracranial circulation. No large vessel occlusion, aneurysm, or significant stenosis is identified. These results will be called to the ordering clinician or representative by  the Radiologist Assistant, and communication documented in the PACS or zVision Dashboard. Electronically Signed   By: Kristine Garbe M.D.   On: 06/30/2018 22:35    ECG & Cardiac Imaging    ECG not available for review on pts chart or in Epic.  Tele - RSR  Assessment & Plan    1.  Stroke:  Pt w/ h/o afib/flutter s/p rfca in 06/2017, HTN, and noncompliance w/ [place] following GIB in 01/2018, admitted 4/14 w/ a 1 day h/o left sided wkns, facial droop, and slurring of speech.  MRI/A has shown R paramedian pons late acute/early subacute infarct, 75mm.  He is in sinus rhythm.  We have been asked to consult re: resumption of [place].  H/H stable.  Pt says that he is now willing to resume NOAC rx.  Given prior GIB w/ xarelto, reasonable to try eliquis 5mg  BID, once cleared by neuro.  He   2.  PAF/Flutter:  S/p prior catheter ablation in 06/2017 w/ afib in 07/2017.  He has been managed w/ oral  blocker therapy and he reports compliance.  He was in sinus rhythm on presentation and remains in sinus today.  He reports compliance w/  blocker rx @ home.  As above, he was not taking [place] due to concerns re: bleeding after GIB in Nov 2019.  CHA2DS2VASc = 5.  He was previously cleared by GI (see d/c summary dated 02/12/2018) to resume oral anticoagulation.  He is willing to resume [place] now.  As above, rec initiating eliquis 5mg  BID once cleared by neuro.  Given GIB hx, would add PPI  as well.  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  He says that even though he has insurance, he expects to have difficulty affording medication, therefor, we will have to be sure to provide a 30 day free card and also assess whether or not he would qualify for the pt assistance program through [facility].  3.  NICM/HFrEF:  EF 35-40% by echo in 08/2017.  Unclear as to role of tachycardia mediation/afib/flutter.  Euvolemic. Cont  blocker, ARB, and MRA therapy.  4.  Essential HTN:  Stable.  5.  H/o GIB:  In 01/2018. H/H stable.  Add PPI when resuming [place].  Signed, Murray Hodgkins, NP 07/01/2018, 9:27 AM  For questions or updates, please contact   Please consult [email or web address] for contact info under Cardiology/STEMI.

## 2018-07-01 NOTE — Evaluation (Addendum)
Physical Therapy Evaluation Patient Details Name: Nathan Nathan [name]. MRN: [id number] DOB: 12/29/1950 Today's Date: 07/01/2018   History of Present Illness  Nathan Nathan [name]  is a 68 y.o. male presents to [facility] 4/14 Nathan [name] L-sided weakness and facial droop x2 days. MRI + for "right paramedian pons late acute/early subacute infarction measuring up to 22 mm. No associated hemorrhage or mass effect" as well as severe chronic microvascular ischemic changes. 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 PTA  Clinical Impression  Pt admitted with above diagnosis. Pt currently with functional limitations due to the deficits listed below (see "PT Problem List"). Upon entry, pt in bed, awake and agreeable to participate. The pt is alert and oriented x3, pleasant, conversational, and following commands consistently. Exam/screenign notes normal sensation in LUE/LLE/face, with noted facial droop, difficult drinking with his straw (subsequent coughing), only trace motor activation on the LLE/LLE. Pt requires maxA for bed mobility and transfers, but with practice this session, is able to rise and establish balance with Rt hemiwalker from an elevated surface at mingGuard assist level (Lt arm supported by author while in stance). Functional mobility assessment demonstrates increased effort/time requirements, poor tolerance, and need for physical assistance, whereas the patient performed these at a higher level of independence PTA. In current state, pt is unable to AMB, would need a WC for mobility and considerable physical assist to perform basic ADL. Pt is anxious to go home, but encouraged to consider a rehab setting that would allow him a high frequency of skilled rehab services for the best possible chance of recovery. Pt will benefit from skilled PT intervention to increase independence and safety with basic mobility in preparation for  discharge to the venue listed below.       Follow Up Recommendations CIR;Supervision/Assistance - 24 hour;Supervision for mobility/OOB(WIth definitely need STR if CIR is not available. )    Equipment Recommendations  (defer to receiving facility)    Recommendations for Other Services       Precautions / Restrictions Precautions Precautions: Fall Restrictions Weight Bearing Restrictions: No      Mobility  Bed Mobility Overal bed mobility: Needs Assistance Bed Mobility: Sit to Supine     Supine to sit: HOB elevated;Max assist Sit to supine: Mod assist   General bed mobility comments: LLE/LUE largely flaccid  Transfers Overall transfer level: Needs assistance Equipment used: Hemi-walker Transfers: Sit to/from Stand Sit to Stand: From elevated surface;Min guard;Min assist         General transfer comment: performed 7-8x with varryign techniques and progressive independence; Requires maxA from low surface ad no AD; minGuardA elevated bed with hemiwalker.   Ambulation/Gait Ambulation/Gait assistance: (unable at this time)              Financial trader Rankin (Stroke Patients Only)       Balance Overall balance assessment: Needs assistance Sitting-balance support: Single extremity supported;Feet supported Sitting balance-Leahy Scale: Fair   Postural control: Left lateral lean Standing balance support: Single extremity supported;During functional activity Standing balance-Leahy Scale: Poor Standing balance comment: eventually able to establish balance in standing with HW.                              Pertinent Vitals/Pain Pain Assessment: No/denies pain    Home Living Family/patient expects  to be discharged to:: Private residence Living Arrangements: Spouse/significant other(fiancee) Available Help at Discharge: Family Type of Home: House Home Access: Stairs to enter;Ramped entrance Entrance  Stairs-Rails: Right Entrance Stairs-Number of Steps: 2 Home Layout: One level [place]: [place] - 2 wheels Additional Comments: Pt previously working as a Training and development officer, recently laid off    Prior Function Level of Independence: Independent         Comments: Ocassionally using RW for improved community mobility.      Hand Dominance   Dominant Hand: Left    Extremity/Trunk Assessment   Upper Extremity Assessment Upper Extremity Assessment: Defer to OT evaluation(upon screening, noted trace activation in finger extension/flexion; trace elbow extension/flexion. Intact sensation and proprioception.)   Lower Extremity Assessment Lower Extremity Assessment: (LLE trace activation in toes, no significant activation noted in knee/hip with bed mobility. )        Communication   Communication: No difficulties  Cognition Arousal/Alertness: Awake/alert Behavior During Therapy: WFL for tasks assessed/performed Overall Cognitive Status: Within Functional Limits for tasks assessed                                        General Comments      Exercises    Assessment/Plan    PT Assessment Patient needs continued PT services  PT Problem List Decreased strength;Decreased activity tolerance;Decreased balance;Decreased mobility;Decreased coordination       PT Treatment Interventions Therapeutic exercise;DME instruction;Gait training;Stair training;Functional mobility training;Therapeutic activities;Patient/family education;Neuromuscular re-education;Balance training;Wheelchair mobility training    PT Goals (Current goals can be found in the Care Plan section)  Acute Rehab PT Goals Patient Stated Goal: Regain independent transfers and AMB  PT Goal Formulation: With patient Time For Goal Achievement: 07/15/18 Potential to Achieve Goals: Fair    Frequency 7X/week   Barriers to discharge Decreased caregiver support requires maxA for transfers, unable to AMB      Co-evaluation               AM-PAC PT "6 Clicks" Mobility  Outcome Measure Help needed turning from your back to your side while in a flat bed without using bedrails?: Total Help needed moving from lying on your back to sitting on the side of a flat bed without using bedrails?: A Lot Help needed moving to and from a bed to a chair (including a wheelchair)?: A Lot Help needed standing up from a chair using your arms (e.g., wheelchair or bedside chair)?: A Little Help needed to walk in hospital room?: Total Help needed climbing 3-5 steps with a railing? : Total 6 Click Score: 10    End of Session Equipment Utilized During Treatment: Gait belt Activity Tolerance: Patient tolerated treatment well;Patient limited by fatigue Patient left: in chair;with chair alarm set;with call bell/phone within reach Nurse Communication: Mobility status;Other (comment)(author got call bell for pt) PT Visit Diagnosis: Unsteadiness on feet (R26.81);Difficulty in walking, not elsewhere classified (R26.2);Dizziness and giddiness (R42);Hemiplegia and hemiparesis Hemiplegia - Right/Left: Left Hemiplegia - dominant/non-dominant: Dominant Hemiplegia - caused by: Cerebral infarction    Time: 1027-1105 PT Time Calculation (min) (ACUTE ONLY): 38 min   Charges:   PT Evaluation $PT Eval Low Complexity: 1 Low PT Treatments $Neuromuscular Re-education: 23-37 mins       12:28 PM, 07/01/18 Etta Grandchild, PT, DPT Physical Therapist - [facility]  [phone] ([place])     Nathan Nathan [name] 07/01/2018, 12:23  PM   

## 2018-07-01 NOTE — Evaluation (Signed)
Occupational Therapy Evaluation Patient Details Name: Nathan [name]. MRN: [id number] DOB: October 11, 1950 Today's Date: 07/01/2018    History of Present Illness Nathan [name]  is a 68 y.o. male who presented to the emergency room with L-sided weakness and facial droop. MRI + for "right paramedian pons late acute/early subacute infarction measuring up to 22 mm. No associated hemorrhage or mass effect" as well as severe chronic microvascular ischemic changes. 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   Clinical Impression   Pt seen for OT evaluation this date. Prior to hospital admission, pt was independent in all aspects of ADL and occasionally using a 2WW for community distances. 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 Pt lives with his fiancee in a one-level home with two steps to enter and R hand rail. Currently pt demonstrates significant impairments in LUE/LLE strength and coordination. Pt is L hand dominant and requires mod/max assist for assist for seated ADL and +2 max assist for transfers due to decreased AROM of his LLE and LUE. Pt is eager to return to his PLOF with increased independence and functional use of his left arm and leg. Pt educated on safe positioning of his LUE in order to promote functional return, improve safety, and promote skin integrity on this date. Pt was motivated t/o OT session and return demonstrated PROM exercises using his RUE with VCs for technique. Pt would benefit from skilled OT to address noted impairments and functional limitations (see below for any additional details) in order to maximize safety and independence while minimizing falls risk and caregiver burden.  Upon hospital discharge, recommend pt discharge to CIR to maximize safety and return to PLOF.      Follow Up Recommendations  CIR    Equipment Recommendations  (TBD at next venue of care. )    Recommendations for Other Services Rehab consult     Precautions / Restrictions Precautions Precautions: Fall Restrictions Weight Bearing Restrictions: No      Mobility Bed Mobility Overal bed mobility: Needs Assistance Bed Mobility: Supine to Sit;Sit to Supine     Supine to sit: HOB elevated;Mod assist Sit to supine: Mod assist   General bed mobility comments: Pt required assist for mgt of LLE when completing bed mobility.   Transfers Overall transfer level: Needs assistance Equipment used: Rolling walker (2 wheeled) Transfers: Sit to/from Stand Sit to Stand: +2 physical assistance         General transfer comment: Pt able to stand from elevated surface after x2 attempts on this date. Required significant assistance and blocking of LLE in order to come to standing from EOB. Once standing was able to weight shift onto both RLE & LLE.     Balance Overall balance assessment: Needs assistance Sitting-balance support: Single extremity supported;Feet supported Sitting balance-Leahy Scale: Fair   Postural control: Left lateral lean Standing balance support: Bilateral upper extremity supported Standing balance-Leahy Scale: Poor Standing balance comment: Pt required +2 assist in order to maintain standing balance with heavy RUE use on RW.                            ADL either performed or assessed with clinical judgement   ADL Overall ADL's : Needs assistance/impaired Eating/Feeding: Set up;Sitting;With adaptive utensils;Minimal assistance   Grooming: Set up;Sitting;Minimal assistance Grooming Details (indicate cue type and reason):  Pt required asssistance for stabalizing toothbrush and opening toothpaste on this date. Made attempts with L UE, but was unable to complete task 2/2 weakness and decreased sensation.  Upper Body Bathing: Sitting;Moderate assistance;With adaptive equipment   Lower Body Bathing: Sitting/lateral  leans;With adaptive equipment;Moderate assistance;Maximal assistance   Upper Body Dressing : Sitting;Minimal assistance;Cueing for compensatory techniques   Lower Body Dressing: Cueing for compensatory techniques;Sit to/from stand;+2 for safety/equipment;Moderate assistance;Maximal assistance   Toilet Transfer: BSC;Stand-pivot;+2 for physical assistance;Moderate assistance;Maximal assistance;RW   Toileting- Clothing Manipulation and Hygiene: Set up;Moderate assistance;+2 for safety/equipment;Sit to/from stand;Cueing for compensatory techniques   Tub/ Shower Transfer: Set up;Maximal assistance;Moderate assistance;+2 for safety/equipment;Stand-pivot;Cueing for sequencing;+2 for physical assistance   Functional mobility during ADLs: +2 for physical assistance;Rolling walker;Maximal assistance       Vision Baseline Vision/History: Wears glasses Wears Glasses: At all times Patient Visual Report: No change from baseline       Perception     Praxis      Pertinent Vitals/Pain Pain Assessment: No/denies pain     Hand Dominance Left   Extremity/Trunk Assessment Upper Extremity Assessment Upper Extremity Assessment: LUE deficits/detail LUE Deficits / Details: Grossly 2+/5. Pt with some active movement against gravity but unable to exceed midline of movement. Grip/FMC minimal pt with trace palpable thenar group contraction, but otherwise very limited AROM in hand/digits. Pt is able to use LUE to stabalize with limited success.  LUE Sensation: decreased light touch LUE Coordination: decreased fine motor;decreased gross motor   Lower Extremity Assessment Lower Extremity Assessment: LLE deficits/detail;Defer to PT evaluation LLE Deficits / Details: Grossly 2/5. Unable to move against gravity, but palpalbe contractions noted t/o LLE. Pt able to stand EOB with heavy UE use on walker for ~1 min.  LLE Sensation: decreased light touch LLE Coordination: decreased fine motor;decreased gross  motor       Communication Communication Communication: No difficulties   Cognition Arousal/Alertness: Awake/alert Behavior During Therapy: WFL for tasks assessed/performed Overall Cognitive Status: Within Functional Limits for tasks assessed                                     General Comments       Exercises Other Exercises Other Exercises: Pt educated in safe positioning of LUE in order to maximize safety and prevent skin breakdown; Pt educated on use of RUE for PROM of LUE. Pt return demonstrated shoulder and elbow flexion/extension exercises and was eager to complete movements with therapist.   Other Exercises: Pt assisted with ADL task (oral care) required min assist to compensate for L-sided function loss.    Shoulder Instructions      Home Living Family/patient expects to be discharged to:: Private residence Living Arrangements: Spouse/significant other(fiancee) Available Help at Discharge: Family Type of Home: House Home Access: Stairs to enter;Ramped entrance Entrance Stairs-Number of Steps: 2 Entrance Stairs-Rails: Right Home Layout: One level     Bathroom Shower/Tub: [place] unit;Curtain   Biochemist, clinical: Standard     Home Equipment: Environmental consultant - 2 wheels   Additional Comments: Pt previously working as a Training and development officer, recently laid off      Prior Functioning/Environment Level of Independence: Independent        Comments: Ocassionally using RW for improved community mobility.         OT Problem List: Decreased strength;Impaired balance (sitting and/or standing);Decreased knowledge of precautions;Impaired tone;Decreased range of motion;Decreased safety awareness;Cardiopulmonary status limiting activity;Impaired UE functional use;Impaired  sensation;Decreased knowledge of use of DME or AE;Decreased coordination;Decreased activity tolerance      OT Treatment/Interventions: Self-care/ADL training;Manual therapy;Therapeutic  exercise;Modalities;Patient/family education;Neuromuscular education;Balance training;Energy conservation;Therapeutic activities;Splinting;DME and/or AE instruction    OT Goals(Current goals can be found in the care plan section) Acute Rehab OT Goals Patient Stated Goal: To use my L side like normal OT Goal Formulation: With patient Time For Goal Achievement: 07/15/18 Potential to Achieve Goals: Good ADL Goals Pt Will Perform Eating: with set-up;with adaptive utensils;sitting(With LRAD as needed for safety and improved functional independence.) Pt Will Perform Upper Body Dressing: with adaptive equipment;sitting;with set-up(With LRAD and compensatory strategies as needed for safety and improved functional independence.) Pt Will Perform Lower Body Dressing: with min assist;sit to/from stand;with adaptive equipment(With LRAD and compensatory strategies as needed for safety and improved functional independence.) Pt Will Transfer to Toilet: with min assist;bedside commode;ambulating(With LRAD/DME as needed for safety and improved functional independence.)  OT Frequency: Min 3X/week   Barriers to D/C:            Co-evaluation              AM-PAC OT "6 Clicks" Daily Activity     Outcome Measure Help from another person eating meals?: A Little Help from another person taking care of personal grooming?: A Little Help from another person toileting, which includes using toliet, bedpan, or urinal?: A Lot Help from another person bathing (including washing, rinsing, drying)?: A Lot Help from another person to put on and taking off regular upper body clothing?: A Little Help from another person to put on and taking off regular lower body clothing?: A Lot 6 Click Score: 15   End of Session Equipment Utilized During Treatment: Gait belt;Rolling walker  Activity Tolerance: Patient tolerated treatment well Patient left: in bed;with bed alarm set;with call bell/phone within reach  OT Visit  Diagnosis: Other symptoms and signs involving the nervous system (R29.898);Hemiplegia and hemiparesis Hemiplegia - Right/Left: Left Hemiplegia - dominant/non-dominant: Dominant Hemiplegia - caused by: Cerebral infarction                Time: 2992-4268 OT Time Calculation (min): 43 min Charges:  OT General Charges $OT Visit: 1 Visit OT Evaluation $OT Eval Moderate Complexity: 1 Mod OT Treatments $Self Care/Home Management : 23-37 mins  Shara Blazing, M.S., OTR/L Ascom: [phone] 07/01/18, 11:50 AM

## 2018-07-01 NOTE — Progress Notes (Signed)
Rehab Admissions Coordinator Note:  Per PT and OT recommendation, this patient was screened by Jhonnie Garner for appropriateness for CIR.   An AC will follow up with pt to determine further interest and candidacy.   Jhonnie Garner 07/01/2018, 1:03 PM  I can be reached at [phone].

## 2018-07-01 NOTE — Evaluation (Signed)
Speech Language Pathology Evaluation Patient Details Name: Nathan [name]. MRN: [id number] DOB: Jun 26, 1950 Today's Date: 07/01/2018 Time: 0630-1601 SLP Time Calculation (min) (ACUTE ONLY): 45 min  Problem List:  Patient Active Problem List   Diagnosis Date Noted  . CVA (cerebral vascular accident) ([place]) 06/30/2018  . CLE (columnar lined esophagus)   . Stomach irritation   . Acute posthemorrhagic anemia   . Hyperkalemia   . Dilated cardiomyopathy ([place]) 08/26/2017  . Chronic a-fib 07/15/2017  . Chronic systolic heart failure ([place]) 07/09/2017  . HTN (hypertension) 07/09/2017  . Tobacco use 07/09/2017  . Atrial flutter ([place]) 06/15/2017  . A-fib ([place]) 06/12/2017  . Atrial flutter with rapid ventricular response ([place]) 04/29/2017  . Elevated troponin 04/29/2017  . AKI (acute kidney injury) ([place]) 04/29/2017   Past Medical History:  Past Medical History:  Diagnosis Date  . Atrial flutter ([place])    a. s/p TEE/DCCV 04/2017; b. 06/2017 s/p RFCA; c. CHADS2VASc => 5 (CHF, HTN, age x 1, CVA)-->noncompliant with Xarelto.  . Chronic combined systolic and diastolic CHF (congestive heart failure) ([place])    a. TTE 2/19: EF 20-25%, diffuse HK, mod dilated LA, mildly reduced RVSF, PASP 42; b. TTE 3/19: EF 20-25%, diffuse HK, RVSF nl, atrial flutter with RVR; c. 08/2017 Echo: EF 35-40%, diff HK. Gr1 DD. mildly dil LA. Low nl RV fxn.   . Essential hypertension   . GI bleed    a.  Hemorrhagic shock in 11/19 secondary to erosive gastropathy and Barrett's esophagus requiring multiple units PRBCs; b. Cleared to resume OAC->pt did not.  Marland Kitchen NICM (nonischemic cardiomyopathy) ([place])    a. 04/2017: Echo EF 20-25%; b. TTE 3/19: EF 20-25%; c. 07/2017 Cath: min irregs; d. 08/2017 Echo: EF 35-40%, diff HK. Gr1 DD. mildly dil LA. Low nl RV fxn.   . Noncompliance with medications   . PAF (paroxysmal atrial fibrillation) ([place])    a. 07/2017 afib->broke w/ IV amio->converted to oral bb due to prior intol to oral amio.  .  Pulmonary hypertension ([place])   . Stroke [facility])    a. 06/2018 MRI/A Brain: R paramedian pons late acute/early subacute infarct, 69mm. No assoc hemorrhage or mass effect.  Sev chronic microvascular isch changtes and mod voluem loss. No large vessel occlusion, aneurysm, or significant stenosis.   Past Surgical History:  Past Surgical History:  Procedure Laterality Date  . A-FLUTTER ABLATION N/A 06/16/2017   Procedure: A-FLUTTER ABLATION;  Surgeon: [name], MD;  Location: [facility];  Service: Cardiovascular;  Laterality: N/A;  . CARDIAC CATHETERIZATION    . CARDIOVERSION N/A 05/01/2017   Procedure: CARDIOVERSION;  Surgeon: [name], MD;  Location: [facility] ORS;  Service: Cardiovascular;  Laterality: N/A;  . CORONARY ANGIOPLASTY    . ESOPHAGOGASTRODUODENOSCOPY (EGD) WITH PROPOFOL N/A 02/09/2018   Procedure: ESOPHAGOGASTRODUODENOSCOPY (EGD) WITH PROPOFOL;  Surgeon: [name], MD;  Location: [facility] ENDOSCOPY;  Service: Endoscopy;  Laterality: N/A;  . RIGHT/LEFT HEART CATH AND CORONARY ANGIOGRAPHY N/A 07/18/2017   Procedure: RIGHT/LEFT HEART CATH AND CORONARY ANGIOGRAPHY;  Surgeon: [name], MD;  Location: [facility];  Service: Cardiovascular;  Laterality: N/A;  . TEE WITHOUT CARDIOVERSION N/A 05/01/2017   Procedure: TRANSESOPHAGEAL ECHOCARDIOGRAM (TEE);  Surgeon: [name], MD;  Location: [facility] ORS;  Service: Cardiovascular;  Laterality: N/A;  . TEE WITHOUT CARDIOVERSION N/A 06/16/2017   Procedure: TRANSESOPHAGEAL ECHOCARDIOGRAM (TEE);  Surgeon: [name], MD;  Location: [facility] ENDOSCOPY;  Service: Cardiovascular;  Laterality: N/A;   HPI:  Pt is a 68 y.o. male presents to [facility] 4/14 w/ L-sided weakness and facial droop x2 days. MRI + for "right paramedian pons late acute/early subacute infarction measuring up to 22 mm. No associated hemorrhage or mass effect" as well as severe chronic microvascular ischemic changes. Pt with a known history of atrial  flutter, chronic systolic and diastolic combined heart failure, hypertension, & pulmonary hypertension. PTA.  Pt was seen by ST services in 01/2018 for Dysphagia (MBSS, BSE).  Pt is currently alert and oriented x3, pleasant, conversational, and following commands consistently; noted Left labial weakness, min Dysarthria.    Assessment / Plan / Recommendation Clinical Impression  Pt appears to present w/ mild-mod left oral/labial weakness resulting in Mild Dysarthria but adequate intelligibiltiy of speech. Pt is able to utilize strategies of slowing rate of speech, increasing volume, and over-articulating which increases his articulation of speech. Pt stated he "does not really hear" any deficits but does endorse feeling a "thickness" of the Left side of his mouth/lips. No other motor speech deficits noted. Auditory Comprehension and Verbal Expressive language apear WFL. No overt Cognitive deficits noted; although, pt states he wants to d/c home - unsure about CIR for Rehab needs though he has Left sided U/LE deficits. Of note, pt does exhibit intermittent coughing when drinking thin liquids (while in room); noted a recent MBSS in 01/2018 indicating pharyngeal phase dysphagia w/ mild risk for aspiration. Pt may need further f/u w/ objective swallow study for updated information in light of this new Right paramedian pons late acute/early subacute infarction - aspiration precautions were recommended to pt including small, single sips and sitting fully upright w/ any oral intake.  Recommend ST services f/u for OM strengthening and Dysarthria tx to improve overall articulation of speech in conversation; education and follow through w/ strategies.     SLP Assessment  SLP Recommendation/Assessment: All further Speech Lanaguage Pathology  needs can be addressed in the next venue of care SLP Visit Diagnosis: Dysarthria and anarthria (R47.1)(f/u for assessment of Dysphagia(known history))    Follow Up  Recommendations  Inpatient Rehab    Frequency and Duration (TBD next setting)  (TBD next setting)      SLP Evaluation Cognition  Overall Cognitive Status: Within Functional Limits for tasks assessed Arousal/Alertness: Awake/alert Orientation Level: Oriented X4 Attention: Focused;Sustained Focused Attention: Appears intact Sustained Attention: Appears intact Memory: Appears intact Awareness: Appears intact(grossly) Problem Solving: Appears intact(grossly) Executive Function: Reasoning Reasoning: Appears intact(grossly) Behaviors: (n/a) Safety/Judgment: (TBD) Comments: pt wants to d/c home - unsure about CIR        Comprehension  Auditory Comprehension Overall Auditory Comprehension: Appears within functional limits for tasks assessed Yes/No Questions: Within Functional Limits Commands: Within Functional Limits Conversation: Simple Interfering Components: (n/a) Visual Recognition/Discrimination Discrimination: Not tested Reading Comprehension Reading Status: Not tested    Expression Expression Primary Mode of Expression: Verbal Verbal Expression Overall Verbal Expression: Appears within functional limits for tasks assessed Initiation: No impairment Automatic Speech: (WFL) Level of Generative/Spontaneous Verbalization: Sentence(WFL) Naming: No impairment Pragmatics: No impairment Non-Verbal Means of Communication: Not applicable Other Verbal Expression Comments: min Dysarthria Written Expression Dominant Hand: Left Written Expression: Not tested   Oral / Motor  Oral Motor/Sensory Function Overall Oral Motor/Sensory Function: Mild impairment(-Moderate impairment) Facial ROM: Reduced left;Suspected CN VII (facial) dysfunction Facial Symmetry: Abnormal symmetry left;Suspected CN VII (facial) dysfunction Lingual ROM: Within Functional Limits Lingual Symmetry: Within Functional Limits(grossly) Lingual Strength: Within Functional Limits Velum: Within Functional  Limits Mandible: Within Functional Limits Motor Speech  Overall Motor Speech: Impaired Respiration: Within functional limits Phonation: Normal Resonance: Within functional limits Articulation: Impaired Level of Impairment: Sentence Intelligibility: Intelligibility reduced Word: 75-100% accurate Phrase: 75-100% accurate Sentence: 75-100% accurate Conversation: 75-100% accurate Motor Planning: Witnin functional limits Motor Speech Errors: Not applicable Effective Techniques: Slow rate;Increased vocal intensity;Over-articulate   GO                     Orinda Kenner, [place], CCC-SLP Watson,Katherine 07/01/2018, 6:37 PM

## 2018-07-02 LAB — POTASSIUM: Potassium: 3.8 mmol/L (ref 3.5–5.1)

## 2018-07-02 LAB — ECHOCARDIOGRAM COMPLETE
Height: 75 in
Weight: 3582.4 oz

## 2018-07-02 MED ORDER — APIXABAN 5 MG PO TABS: 5.0000 mg | ORAL_TABLET | Freq: Two times a day (BID) | ORAL | 0 refills | Status: DC

## 2018-07-02 MED ORDER — ATORVASTATIN CALCIUM 80 MG PO TABS: 80.0000 mg | ORAL_TABLET | Freq: Every day | ORAL | 11 refills | Status: DC

## 2018-07-02 MED ORDER — PANTOPRAZOLE SODIUM 40 MG PO TBEC: 40.0000 mg | DELAYED_RELEASE_TABLET | Freq: Every day | ORAL | 0 refills | Status: DC

## 2018-07-02 NOTE — TOC Initial Note (Signed)
Transition of Care (TOC) - Initial/Assessment Note    Patient Details  Name: Nathan [name]. MRN: [id number] Date of Birth: [birth year]  Transition of Care [facility]) CM/SW Contact:    Annamaria Boots, [place] Phone Number: 07/02/2018, 11:03 AM  Clinical Narrative:  Patient was recommended for CIR but CIR has denied. CSW met with patient to discuss discharge planning. CSW explained to patient that CIR has denied him and next option would be SNF for rehab. Patient is agreeable to SNF. CSW explained that MD would like to discharge patient today. Patient is agreeable. CSW will begin bed search and give offers once available. CSW will follow for discharge planning.                  Expected Discharge Plan: Skilled Nursing Facility Barriers to Discharge: SNF Pending bed offer   Patient Goals and CMS Choice Patient states their goals for this hospitalization and ongoing recovery are:: " i want to get some strength back so I can go home"  CMS Medicare.gov Compare Post Acute Care list provided to:: Patient    Expected Discharge Plan and Services Expected Discharge Plan: [place]       Living arrangements for the past 2 months: Single Family Home Expected Discharge Date: 07/02/18                        Prior Living Arrangements/Services Living arrangements for the past 2 months: Single Family Home Lives with:: Significant Other Patient language and need for interpreter reviewed:: Yes Do you feel safe going back to the place where you live?: Yes      Need for Family Participation in Patient Care: Yes (Comment) Care giver support system in place?: Yes (comment)   Criminal Activity/Legal Involvement Pertinent to Current Situation/Hospitalization: No - Comment as needed  Activities of Daily Living Home Assistive Devices/Equipment: None ADL Screening (condition at time of admission) Patient's cognitive ability adequate to safely complete daily activities?: Yes Is the  patient deaf or have difficulty hearing?: No Does the patient have difficulty seeing, even when wearing glasses/contacts?: No Does the patient have difficulty concentrating, remembering, or making decisions?: No Patient able to express need for assistance with ADLs?: Yes Does the patient have difficulty dressing or bathing?: Yes Independently performs ADLs?: Yes (appropriate for developmental age) Does the patient have difficulty walking or climbing stairs?: Yes Weakness of Legs: Left Weakness of Arms/Hands: Left  Permission Sought/Granted Permission sought to share information with : Case Manager, Customer service manager, Family Supports Permission granted to share information with : Yes, Verbal Permission Granted              Emotional Assessment Appearance:: Appears stated age   Affect (typically observed): Accepting, Hopeful Orientation: : Oriented to Self, Oriented to Place, Oriented to  Time, Oriented to Situation Alcohol / Substance Use: Not Applicable Psych Involvement: No (comment)  Admission diagnosis:  Acute ischemic stroke [facility]) [I63.9] Patient Active Problem List   Diagnosis Date Noted  . CVA (cerebral vascular accident) ([place]) 06/30/2018  . CLE (columnar lined esophagus)   . Stomach irritation   . Acute posthemorrhagic anemia   . Hyperkalemia   . Dilated cardiomyopathy ([place]) 08/26/2017  . Chronic a-fib 07/15/2017  . Chronic systolic heart failure ([place]) 07/09/2017  . HTN (hypertension) 07/09/2017  . Tobacco use 07/09/2017  . Atrial flutter ([place]) 06/15/2017  . A-fib ([place]) 06/12/2017  . Atrial flutter with rapid ventricular response ([place]) 04/29/2017  . Elevated  troponin 04/29/2017  . AKI (acute kidney injury) ([place]) 04/29/2017   PCP:  Patient, [name] Pharmacy:   [facility] [address], [place] [address] Phone: [phone] Fax: [phone]     Social Determinants of Health (SDOH)  Interventions    Readmission Risk Interventions No flowsheet data found.

## 2018-07-02 NOTE — Progress Notes (Signed)
Physical Therapy Treatment Patient Details Name: Nathan [name]. MRN: [id number] DOB: Oct 19, 1950 Today's Date: 07/02/2018    History of Present Illness Nathan [name]  is a 68 y.o. male [facility] 4/14 c L-sided weakness and facial droop x2 days. MRI + for "right paramedian pons late acute/early subacute infarction measuring up to 22 mm. No associated hemorrhage or mass effect" as well as severe chronic microvascular ischemic changes. 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 PTA    PT Comments    Pt received in bed, agreeable to participate. Started session with exercises in bed for LLE, Pt has much improved activation in hip extension, flexion, and abduction, but knee and ankle activation remain trace or less. During cues for SAQ, pt has trace activation of rectus femoris only, but remaining quads without visible twitching. Pt moves to EOB without assistance, supervision level for safety. Pt alternates STS transfers with HW (RUE) and sitting independently at EOB. Intermittent cuing needed for [facility] positioning and technique, pt able to come to standing and balance independently 50% of reps from an elevated surface. KI does not arrive until end of session, but pt is allowed to attempt some AMB with KI in place prior to transitioning to chair. Some utility allows for decreased buckling on LLE, but KI adjustments need be made prior to attempting again. Pt progressing well in general.      Follow Up Recommendations  CIR;Supervision/Assistance - 24 hour;Supervision for mobility/OOB     Equipment Recommendations  Other (comment)(deter to facility)    Recommendations for Other Services       Precautions / Restrictions Precautions Precautions: Fall Precaution Comments: Protect LUE from sublulxation at rest Restrictions Weight Bearing Restrictions: No    Mobility  Bed Mobility Overal bed  mobility: Needs Assistance Bed Mobility: Supine to Sit     Supine to sit: Supervision     General bed mobility comments: cued to try without assistance to perform with heavy effort, no physical assistance needed  Transfers Overall transfer level: Needs assistance Equipment used: Hemi-walker Transfers: Sit to/from Stand;Stand Pivot Transfers Sit to Stand: Min assist;Min guard         General transfer comment: Given visual demo 2 for transfers with HW from chair; Pt performs 2 sets of 4, standing for 15 seconds each time; Pt requires no physical assistance for lift or balance for 50% of reps.   Ambulation/Gait Ambulation/Gait assistance: Mod assist Gait Distance (Feet): 4 Feet(Ran out of time to attempt again; will try again later with +2.) Assistive device: Hemi-walker(Lt KI to assist with buckling.)       General Gait Details: Just a quick trial prior to meal set-up, but will adjust KI adn attempt again; pt has difficulty with stright plane management   Stairs             Wheelchair Mobility    Modified Rankin (Stroke Patients Only)       Balance Overall balance assessment: Needs assistance Sitting-balance support: Single extremity supported;Feet supported Sitting balance-Leahy Scale: Fair   Postural control: Left lateral lean Standing balance support: Single extremity supported;During functional activity Standing balance-Leahy Scale: Poor Standing balance comment: requires RUE support on hemiwalker                            Cognition Arousal/Alertness: Awake/alert Behavior During Therapy: WFL for tasks assessed/performed Overall Cognitive Status: Within Functional  Limits for tasks assessed                                        Exercises Other Exercises Other Exercises: Supine LLE heel slides (abdct./add) 1x15 AA/ROM, pt does >75% of work. Other Exercises: Supine LLE Heel slides 1x15 AA?ROM (pt does >50% of work)  Other  Exercises: Supine LLE Leg Press (manually resisted) 1x15 AR/ROM Other Exercises: SUpine LLE SAQ P/ROM: pt has poor activation, upable to contribute  Other Exercises: Hooklying Marching LLE 1x10 (pt does >75% of work);  Hooklying bridges 1x10 bilat (pt fully clears hips from bed, pelvis appears largely level.     General Comments        Pertinent Vitals/Pain Pain Assessment: No/denies pain    Home Living                      Prior Function            PT Goals (current goals can now be found in the care plan section) Acute Rehab PT Goals Patient Stated Goal: Regain independent transfers and AMB  PT Goal Formulation: With patient Time For Goal Achievement: 07/15/18 Potential to Achieve Goals: Fair Progress towards PT goals: Progressing toward goals    Frequency    7X/week      PT Plan Current plan remains appropriate    Co-evaluation              AM-PAC PT "6 Clicks" Mobility   Outcome Measure  Help needed turning from your back to your side while in a flat bed without using bedrails?: None Help needed moving from lying on your back to sitting on the side of a flat bed without using bedrails?: A Little Help needed moving to and from a bed to a chair (including a wheelchair)?: A Little Help needed standing up from a chair using your arms (e.g., wheelchair or bedside chair)?: A Little Help needed to walk in hospital room?: A Lot Help needed climbing 3-5 steps with a railing? : Total 6 Click Score: 16    End of Session Equipment Utilized During Treatment: Left knee immobilizer Activity Tolerance: Patient tolerated treatment well Patient left: in chair;with chair alarm set;with call bell/phone within reach;Other (comment)(meal tray presented) Nurse Communication: Mobility status;Other (comment) PT Visit Diagnosis: Unsteadiness on feet (R26.81);Difficulty in walking, not elsewhere classified (R26.2);Dizziness and giddiness (R42);Hemiplegia and  hemiparesis Hemiplegia - Right/Left: Left Hemiplegia - dominant/non-dominant: Dominant Hemiplegia - caused by: Cerebral infarction     Time: 9826-4158 PT Time Calculation (min) (ACUTE ONLY): 60 min  Charges:  $Neuromuscular Re-education: 53-67 mins                     12:59 PM, 07/02/18 Etta Grandchild, PT, DPT Physical Therapist - [facility]  [phone] ([place])     [name],Nathan C 07/02/2018, 12:52 PM

## 2018-07-02 NOTE — Discharge Summary (Addendum)
[facility] NAME: Nathan [name]    MR#:  [id number]  DATE OF BIRTH:  [birth year]  DATE OF ADMISSION:  06/30/2018 ADMITTING PHYSICIAN: Saundra Shelling, MD  DATE OF DISCHARGE: 07/03/2018  PRIMARY CARE PHYSICIAN: Patient, [name]    ADMISSION DIAGNOSIS:  Acute ischemic stroke ([place]) [I63.9]  DISCHARGE DIAGNOSIS:  Active Problems:   CVA (cerebral vascular accident) ([place])   SECONDARY DIAGNOSIS:   Past Medical History:  Diagnosis Date  . Atrial flutter ([place])    a. s/p TEE/DCCV 04/2017; b. 06/2017 s/p RFCA; c. CHADS2VASc => 5 (CHF, HTN, age x 1, CVA)-->noncompliant with Xarelto.  . Chronic combined systolic and diastolic CHF (congestive heart failure) ([place])    a. TTE 2/19: EF 20-25%, diffuse HK, mod dilated LA, mildly reduced RVSF, PASP 42; b. TTE 3/19: EF 20-25%, diffuse HK, RVSF nl, atrial flutter with RVR; c. 08/2017 Echo: EF 35-40%, diff HK. Gr1 DD. mildly dil LA. Low nl RV fxn.   . Essential hypertension   . GI bleed    a.  Hemorrhagic shock in 11/19 secondary to erosive gastropathy and Barrett's esophagus requiring multiple units PRBCs; b. Cleared to resume OAC->pt did not.  Marland Kitchen NICM (nonischemic cardiomyopathy) ([place])    a. 04/2017: Echo EF 20-25%; b. TTE 3/19: EF 20-25%; c. 07/2017 Cath: min irregs; d. 08/2017 Echo: EF 35-40%, diff HK. Gr1 DD. mildly dil LA. Low nl RV fxn.   . Noncompliance with medications   . PAF (paroxysmal atrial fibrillation) ([place])    a. 07/2017 afib->broke w/ IV amio->converted to oral bb due to prior intol to oral amio.  . Pulmonary hypertension ([place])   . Stroke [facility])    a. 06/2018 MRI/A Brain: R paramedian pons late acute/early subacute infarct, 33mm. No assoc hemorrhage or mass effect.  Sev chronic microvascular isch changtes and mod voluem loss. No large vessel occlusion, aneurysm, or significant stenosis.    HOSPITAL COURSE:   68 year old male with history of atrial flutter, combined chronic systolic and  diastolic heart failure and hypertension who presented to the emergency room due to left-sided weakness.   1. Right paramedian pons late acute/early subacute infarction measuring up to 22 mm. No associated hemorrhage or mass effect causing left-sided hemiplegia and left facial droop: Given patient's history of atrial flutter he was started on anticoagulation. He was supposed to be on Xarelto but never started on it.  He prefers Eliquis die to lower GIB profile.  Carotid Doppler without significant stenosis Echocardiogram 1. The left ventricle has mild-moderately reduced systolic function, with an ejection fraction of 40-45%. The cavity size was normal. Left ventricular diastolic Doppler parameters are consistent with impaired relaxation. Unable to exclude regional wall  motion abnormality.  2. The right ventricle has normal systolic function. The cavity was normal. There is no increase in right ventricular wall thickness.  3. Negative saline contrast bubble study  4. EF improved compared to prior study in 2019   LDL 121 He will start on statin therapy with goal LDL less than 70  2.  Combined chronic systolic and diastolic heart failure ejection fraction 40-45% ECHO 4/20: Patient is euvolemic Continue Lasix, Aldactone and metoprolol    3.  History of atrial flutter status post ablation in the past: Cardiology consultation was obtained. He will start on Eliquis and continue metoprolol for heart rate control He will need outpatient follow up with Cardiology.  4.  Essential hypertension: Continue metoprolol, Aldactone and losartan  DISCHARGE CONDITIONS AND DIET:   Stable Cardiac diet  CONSULTS OBTAINED:  Treatment Team:  [name], MD  DRUG ALLERGIES:  No Known Allergies  DISCHARGE MEDICATIONS:   Allergies as of 07/02/2018   No Known Allergies     Medication List    STOP taking these medications   rivaroxaban 20 MG Tabs tablet Commonly known as:  Xarelto      TAKE these medications   apixaban 5 MG Tabs tablet Commonly known as:  ELIQUIS Take 1 tablet (5 mg total) by mouth 2 (two) times daily.   atorvastatin 80 MG tablet Commonly known as:  Lipitor Take 1 tablet (80 mg total) by mouth daily.   furosemide 40 MG tablet Commonly known as:  LASIX Take 1/2 tablet (20 mg) by mouth once daily   losartan 100 MG tablet Commonly known as:  COZAAR Take 100 mg by mouth daily.   metoprolol succinate 50 MG 24 hr tablet Commonly known as:  TOPROL-XL Take 50 mg by mouth daily.   pantoprazole 40 MG tablet Commonly known as:  PROTONIX Take 1 tablet (40 mg total) by mouth daily at 6 (six) AM. Start taking on:  July 03, 2018   potassium chloride SA 20 MEQ tablet Commonly known as:  K-DUR,KLOR-CON Take 20 mEq by mouth daily.   spironolactone 25 MG tablet Commonly known as:  ALDACTONE Take 25 mg by mouth daily.         Today   CHIEF COMPLAINT:   patient here with acute stroke.   VITAL SIGNS:  Blood pressure 113/86, pulse 65, temperature (!) 97.3 F (36.3 C), temperature source Axillary, resp. rate 16, height 6\' 3"  (1.905 m), weight 101.6 kg, SpO2 97 %.   REVIEW OF SYSTEMS:  Review of Systems  Constitutional: Negative.  Negative for chills, fever and malaise/fatigue.  HENT: Negative.  Negative for ear discharge, ear pain, hearing loss, nosebleeds and sore throat.   Eyes: Negative.  Negative for blurred vision and pain.  Respiratory: Negative.  Negative for cough, hemoptysis, shortness of breath and wheezing.   Cardiovascular: Negative.  Negative for chest pain, palpitations and leg swelling.  Gastrointestinal: Negative.  Negative for abdominal pain, blood in stool, diarrhea, nausea and vomiting.  Genitourinary: Negative.  Negative for dysuria.  Musculoskeletal: Negative.  Negative for back pain.  Skin: Negative.   Neurological: Positive for sensory change, speech change, focal weakness and weakness. Negative for dizziness,  tremors, seizures and headaches.  Endo/Heme/Allergies: Negative.  Does not bruise/bleed easily.  Psychiatric/Behavioral: Negative.  Negative for depression, hallucinations and suicidal ideas.     PHYSICAL EXAMINATION:  GENERAL:  68 y.o.-year-old patient lying in the bed with no acute distress.  NECK:  Supple, no jugular venous distention. No thyroid enlargement, no tenderness.  LUNGS: Normal breath sounds bilaterally, no wheezing, rales,rhonchi  No use of accessory muscles of respiration.  CARDIOVASCULAR: S1, S2 normal. No murmurs, rubs, or gallops.  ABDOMEN: Soft, non-tender, non-distended. Bowel sounds present. No organomegaly or mass.  EXTREMITIES: No pedal edema, cyanosis, or clubbing.  PSYCHIATRIC: The patient is alert and oriented x 3.  SKIN: No obvious rash, lesion, or ulcer.  NeurO: Left facial droop with left-sided hemiplegia decreased sensation left upper arm DATA REVIEW:   CBC Recent Labs  Lab 06/30/18 1501  WBC 5.3  HGB 12.5*  HCT 38.1*  PLT 264    Chemistries  Recent Labs  Lab 06/30/18 1501 07/02/18 0258  NA 140  --   K 4.4 3.8  CL 106  --  CO2 23  --   GLUCOSE 116*  --   BUN 22  --   CREATININE 1.41*  --   CALCIUM 9.7  --   AST 17  --   ALT 13  --   ALKPHOS 53  --   BILITOT 0.4  --     Cardiac Enzymes Recent Labs  Lab 06/30/18 1501  TROPONINI <0.03    Microbiology Results  @[email or web address] @  RADIOLOGY:  Ct Head Wo Contrast  Result Date: 06/30/2018 CLINICAL DATA:  Left-sided weakness and right-sided facial droop EXAM: CT HEAD WITHOUT CONTRAST TECHNIQUE: Contiguous axial images were obtained from the base of the skull through the vertex without intravenous contrast. COMPARISON:  February 07, 2018 FINDINGS: Brain: Mild diffuse atrophy is stable. There is no intracranial mass, hemorrhage, extra-axial fluid collection, or midline shift. There is small vessel disease throughout the centra semiovale bilaterally. Small vessel disease is noted in each  internal capsule. There is no demonstrable acute infarct. Vascular: There is no appreciable hyperdense vessel. There is calcification in each distal vertebral artery and carotid siphon region. Skull: The bony calvarium appears intact. Sinuses/Orbits: There is mucosal thickening in several ethmoid air cells. Other visualized paranasal sinuses are clear. Orbits appear symmetric bilaterally except for evidence of previous cataract removal on the left. Other: Mastoid air cells are clear. There is debris in each external auditory canal. IMPRESSION: Atrophy with fairly extensive periventricular small vessel disease. Small vessel disease noted in each internal capsule region. No acute infarct. No mass or hemorrhage. There are foci of arterial vascular calcification. There is mucosal thickening in several ethmoid air cells. There is probable cerumen in each external auditory canal. Electronically Signed   By: Lowella Grip III M.D.   On: 06/30/2018 15:22   Mr Brain Wo Contrast  Result Date: 06/30/2018 CLINICAL DATA:  68 y/o M; left-sided weakness, facial droop, slurred speech. EXAM: MRI HEAD WITHOUT CONTRAST MRA HEAD WITHOUT CONTRAST TECHNIQUE: Multiplanar, multiecho pulse sequences of the brain and surrounding structures were obtained without intravenous contrast. Angiographic images of the head were obtained using MRA technique without contrast. COMPARISON:  06/30/2018 CT head. FINDINGS: MRI HEAD FINDINGS Brain: Reduced diffusion within right paramedian pons measuring 22 x 13 x 10 mm (AP x ML x CC series 2, image 20 and series 4, image 19) compatible with acute/early subacute infarction. No associated hemorrhage or mass effect. The infarct is T2 FLAIR hyperintense. Large confluent nonspecific T2 FLAIR hyperintensities in subcortical and periventricular white matter are compatible with severe chronic microvascular ischemic changes. Moderate volume loss of the brain. Motion degraded susceptibility weighted sequence.  No mass effect, extra-axial collection, hydrocephalus, or herniation. Vascular: Normal flow voids. Skull and upper cervical spine: Normal marrow signal. Sinuses/Orbits: Negative. Other: None. MRA HEAD FINDINGS Internal carotid arteries: Patent. Prominent infundibulum of the right inferolateral trunk noted. Anterior cerebral arteries:  Patent. Middle cerebral arteries: Patent. Anterior communicating artery: Patent. Posterior communicating arteries: Not identified, likely hypoplastic or absent. Posterior cerebral arteries:  Patent.  Mild right P2 stenosis. Basilar artery:  Patent. Vertebral arteries: The right vertebral artery V3/V4 junction is poorly visualized, however this is likely due to artifact which is visible throughout that level in the neck. Left dominant vertebrobasilar system. No evidence of high-grade stenosis, large vessel occlusion, or aneurysm. Lumen irregularity of carotid siphons is compatible with intracranial atherosclerosis. IMPRESSION: 1. Right paramedian pons late acute/early subacute infarction measuring up to 22 mm. No associated hemorrhage or mass effect. 2. Severe chronic microvascular ischemic changes and moderate  volume loss of the brain. 3. Patent anterior and posterior intracranial circulation. No large vessel occlusion, aneurysm, or significant stenosis is identified. These results will be called to the ordering clinician or representative by the Radiologist Assistant, and communication documented in the PACS or zVision Dashboard. Electronically Signed   By: Kristine Garbe M.D.   On: 06/30/2018 22:35   US Carotid Bilateral (at [facility] Only)  Result Date: 07/01/2018 CLINICAL DATA:  Acute right-sided pontine infarct. EXAM: BILATERAL CAROTID DUPLEX ULTRASOUND TECHNIQUE: Pearline Cables scale imaging, color Doppler and duplex ultrasound were performed of bilateral carotid and vertebral arteries in the neck. COMPARISON:  None. FINDINGS: Criteria: Quantification of carotid stenosis is  based on velocity parameters that correlate the residual internal carotid diameter with NASCET-based stenosis levels, using the diameter of the distal internal carotid lumen as the denominator for stenosis measurement. The following velocity measurements were obtained: RIGHT ICA:  54/22 cm/sec CCA:  16/10 cm/sec SYSTOLIC ICA/CCA RATIO:  0.7 ECA:  55 cm/sec LEFT ICA:  66/18 cm/sec CCA:  96/04 cm/sec SYSTOLIC ICA/CCA RATIO:  0.9 ECA:  49 cm/sec RIGHT CAROTID ARTERY: Mild scattered calcified plaque is present at the level of the common carotid artery, carotid bulb and proximal right internal carotid artery. Estimated right ICA stenosis is less than 50%. RIGHT VERTEBRAL ARTERY: Antegrade flow with normal waveform and velocity. LEFT CAROTID ARTERY: Mild scattered calcified plaque is present at the level of the distal common carotid artery common carotid bulb and left ICA origin. Estimated left ICA stenosis is less than 50%. LEFT VERTEBRAL ARTERY: Antegrade flow with normal waveform and velocity. IMPRESSION: Mild scattered calcified plaque at the level of bilateral common carotid arteries, carotid bulbs and internal carotid arteries. No significant ICA stenosis identified with estimated bilateral ICA stenoses of less than 50%. Electronically Signed   By: Aletta Edouard M.D.   On: 07/01/2018 09:07   Dg Chest Portable 1 View  Result Date: 06/30/2018 CLINICAL DATA:  Syncope with heart block EXAM: PORTABLE CHEST 1 VIEW COMPARISON:  February 09, 2018 FINDINGS: No edema or consolidation. Heart size and pulmonary vascularity are normal. No adenopathy. There is aortic atherosclerosis. No pneumothorax. No bone lesions. IMPRESSION: No edema or consolidation. Heart size within normal limits. Aortic Atherosclerosis (ICD10-I70.0). Electronically Signed   By: Lowella Grip III M.D.   On: 06/30/2018 17:40   Mr Jodene Nam Head/brain VW Cm  Result Date: 06/30/2018 CLINICAL DATA:  68 y/o M; left-sided weakness, facial droop, slurred  speech. EXAM: MRI HEAD WITHOUT CONTRAST MRA HEAD WITHOUT CONTRAST TECHNIQUE: Multiplanar, multiecho pulse sequences of the brain and surrounding structures were obtained without intravenous contrast. Angiographic images of the head were obtained using MRA technique without contrast. COMPARISON:  06/30/2018 CT head. FINDINGS: MRI HEAD FINDINGS Brain: Reduced diffusion within right paramedian pons measuring 22 x 13 x 10 mm (AP x ML x CC series 2, image 20 and series 4, image 19) compatible with acute/early subacute infarction. No associated hemorrhage or mass effect. The infarct is T2 FLAIR hyperintense. Large confluent nonspecific T2 FLAIR hyperintensities in subcortical and periventricular white matter are compatible with severe chronic microvascular ischemic changes. Moderate volume loss of the brain. Motion degraded susceptibility weighted sequence. No mass effect, extra-axial collection, hydrocephalus, or herniation. Vascular: Normal flow voids. Skull and upper cervical spine: Normal marrow signal. Sinuses/Orbits: Negative. Other: None. MRA HEAD FINDINGS Internal carotid arteries: Patent. Prominent infundibulum of the right inferolateral trunk noted. Anterior cerebral arteries:  Patent. Middle cerebral arteries: Patent. Anterior communicating artery: Patent. Posterior  communicating arteries: Not identified, likely hypoplastic or absent. Posterior cerebral arteries:  Patent.  Mild right P2 stenosis. Basilar artery:  Patent. Vertebral arteries: The right vertebral artery V3/V4 junction is poorly visualized, however this is likely due to artifact which is visible throughout that level in the neck. Left dominant vertebrobasilar system. No evidence of high-grade stenosis, large vessel occlusion, or aneurysm. Lumen irregularity of carotid siphons is compatible with intracranial atherosclerosis. IMPRESSION: 1. Right paramedian pons late acute/early subacute infarction measuring up to 22 mm. No associated hemorrhage or  mass effect. 2. Severe chronic microvascular ischemic changes and moderate volume loss of the brain. 3. Patent anterior and posterior intracranial circulation. No large vessel occlusion, aneurysm, or significant stenosis is identified. These results will be called to the ordering clinician or representative by the Radiologist Assistant, and communication documented in the PACS or zVision Dashboard. Electronically Signed   By: Kristine Garbe M.D.   On: 06/30/2018 22:35      Allergies as of 07/02/2018   No Known Allergies     Medication List    STOP taking these medications   rivaroxaban 20 MG Tabs tablet Commonly known as:  Xarelto     TAKE these medications   apixaban 5 MG Tabs tablet Commonly known as:  ELIQUIS Take 1 tablet (5 mg total) by mouth 2 (two) times daily.   atorvastatin 80 MG tablet Commonly known as:  Lipitor Take 1 tablet (80 mg total) by mouth daily.   furosemide 40 MG tablet Commonly known as:  LASIX Take 1/2 tablet (20 mg) by mouth once daily   losartan 100 MG tablet Commonly known as:  COZAAR Take 100 mg by mouth daily.   metoprolol succinate 50 MG 24 hr tablet Commonly known as:  TOPROL-XL Take 50 mg by mouth daily.   pantoprazole 40 MG tablet Commonly known as:  PROTONIX Take 1 tablet (40 mg total) by mouth daily at 6 (six) AM. Start taking on:  July 03, 2018   potassium chloride SA 20 MEQ tablet Commonly known as:  K-DUR,KLOR-CON Take 20 mEq by mouth daily.   spironolactone 25 MG tablet Commonly known as:  ALDACTONE Take 25 mg by mouth daily.          Management plans discussed with the patient and he is in agreement. Stable for discharge   Patient should follow up with neurology  CODE STATUS:     Code Status Orders  (From admission, onward)         Start     Ordered   06/30/18 2008  Full code  Continuous     06/30/18 2007        Code Status History    Date Active Date Inactive Code Status Order ID Comments  User Context   02/07/2018 0605 02/12/2018 1548 Full Code [id number]  Arta Silence, MD ED   07/15/2017 1955 07/19/2017 1908 Full Code [id number]  Salary, Avel Peace, MD Inpatient   06/16/2017 1915 06/17/2017 1850 Full Code [id number]  [name], MD Inpatient   06/15/2017 1702 06/16/2017 1915 Full Code [id number]  Daune Perch, NP Inpatient   06/12/2017 2142 06/15/2017 1523 Full Code [id number]  Demetrios Loll, MD Inpatient   04/29/2017 2333 05/01/2017 1920 Full Code [id number]  Lance Coon, MD Inpatient      TOTAL TIME TAKING CARE OF THIS PATIENT: 38 minutes.    Note: This dictation was prepared with Dragon dictation along with smaller phrase technology. Any transcriptional errors that result from this process are  unintentional.  Bettey Costa M.D on 07/02/2018 at 10:08 AM  Between 7am to 6pm - Pager - [phone] After 6pm go to [email or web address] - password [email or web address] [facility]  Office  [phone]  CC: Primary care physician; Patient, [name]

## 2018-07-02 NOTE — Plan of Care (Signed)
  Problem: Education: Goal: Knowledge of disease or condition will improve Outcome: Progressing Goal: Knowledge of secondary prevention will improve Outcome: Progressing Goal: Knowledge of patient specific risk factors addressed and post discharge goals established will improve Outcome: Progressing   Problem: Coping: Goal: Will verbalize positive feelings about self Outcome: Progressing   Problem: Self-Care: Goal: Ability to participate in self-care as condition permits will improve Outcome: Progressing Goal: Ability to communicate needs accurately will improve Outcome: Progressing   Problem: Nutrition: Goal: Risk of aspiration will decrease Outcome: Progressing   Problem: Ischemic Stroke/TIA Tissue Perfusion: Goal: Complications of ischemic stroke/TIA will be minimized Outcome: Progressing   Problem: Pain Managment: Goal: General experience of comfort will improve Outcome: Progressing   Problem: Safety: Goal: Ability to remain free from injury will improve Outcome: Progressing   Problem: Skin Integrity: Goal: Risk for impaired skin integrity will decrease Outcome: Progressing

## 2018-07-02 NOTE — Progress Notes (Signed)
Physical Therapy Treatment Patient Details Name: Nathan [name]. MRN: [id number] DOB: October 10, 1950 Today's Date: 07/02/2018    History of Present Illness Nathan [name]  is a 68 y.o. male presents to [facility] 4/14 c L-sided weakness and facial droop x2 days. MRI + for "right paramedian pons late acute/early subacute infarction measuring up to 22 mm. No associated hemorrhage or mass effect" as well as severe chronic microvascular ischemic changes. 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 PTA    PT Comments    Pt admitted with above diagnosis. Pt currently with functional limitations due to the deficits listed below (see "PT Problem List"). Upon entry, pt in chair still from AM session. KI is doffed, redonned with assured alignment about the knee joint for maximal assistance in TKE stabilization in weight bearing. Pt participates in 4 STS transfers from chair, which is difficult d/t to lower height, pt offered maxA for lift assist. Pt also participated in AMB 35ft, then AMB 26ft. For AMB, gait training, pt requires +2-3, chair follow, heavy verbal cues for step sequencing and hemiwalker use, as well as eventual Left knee tibial block from author as KI has partial mechanical insufficiency in providing some passive knee joint control in loading. Pt given minA for LLE progression, sliding foot forward on floor. KI not functionally successful, pt would have greater support with a locking hinged knee brace, or potentially a heavy supporting AFO, however pt has no lace up shoes in hospital this date, hence AFO trial not performed. Pt reports he will ask his significant other to bring him some athletic shoes. At present, Lt knee buckling is the biggest detractor from independence with AMB, hence obtaining sufficient knee control in the sagittal plane, I suspect, could more than double patient's AMB distance and independence  level. Will continue to follow.     Follow Up Recommendations  CIR;Supervision/Assistance - 24 hour;Supervision for mobility/OOB     Equipment Recommendations  Other (comment)    Recommendations for Other Services       Precautions / Restrictions Precautions Precautions: Fall Precaution Comments: Protect LUE from sublulxation at rest Restrictions Weight Bearing Restrictions: No    Mobility  Bed Mobility Overal bed mobility: Needs Assistance Bed Mobility: Sit to Supine     Supine to sit: Supervision     General bed mobility comments: Cued to scoot up in bed toward [facility], able to perform without physical assist   Transfers Overall transfer level: Needs assistance Equipment used: Hemi-walker Transfers: Sit to/from [employer] Transfers Sit to Stand: Max assist;+2 safety/equipment(from chair height in hallway durign gait training. ) Stand pivot transfers: Max assist;+2 safety/equipment       General transfer comment: from chair to bed at EOS, pt fatigued and assisted for time-sake  Ambulation/Gait Ambulation/Gait assistance: Mod assist;+2 safety/equipment Gait Distance (Feet): 7 Feet(AMB 40ft c +2-3 and chair follow; then 49ft with Lt knee tibial blocking. ) Assistive device: Hemi-walker(Lt KI to assist with buckling, largely not sufficient.)       General Gait Details: 3-point RUE HW gait, cued for HW placement, cues for Rt trunk lean. KI quickly fails to provide sustained TKE in loading, and reverts to preventing buckling beyond 40 degrees, which detracts form balance and gait control. PT given knee block to assist. MinA for LLE fwd slide progress with sock inside out. Pt given simple cues for step/HW sequencing. VC to avoid movements that are too far outside of BOS.  Several LOB throughout require up to [facility] for reestablishing balance.     Stairs             Wheelchair Mobility    Modified Rankin (Stroke Patients Only)       Balance Overall balance  assessment: Needs assistance Sitting-balance support: Feet supported;No upper extremity supported Sitting balance-Leahy Scale: Good   Postural control: Left lateral lean Standing balance support: Single extremity supported;During functional activity Standing balance-Leahy Scale: Poor(fluctuaing performance, able to achieve indpeendent balance ~50% of transfers) Standing balance comment: requires RUE support on hemiwalker                            Cognition Arousal/Alertness: Awake/alert Behavior During Therapy: WFL for tasks assessed/performed Overall Cognitive Status: Within Functional Limits for tasks assessed                                        Exercises Other Exercises Other Exercises: Supine LLE heel slides (abdct./add) 1x15 AA/ROM, pt does >75% of work. Other Exercises: Supine LLE Heel slides 1x15 AA?ROM (pt does >50% of work)  Other Exercises: Supine LLE Leg Press (manually resisted) 1x15 AR/ROM Other Exercises: SUpine LLE SAQ P/ROM: pt has poor activation, upable to contribute  Other Exercises: Hooklying Marching LLE 1x10 (pt does >75% of work);  Hooklying bridges 1x10 bilat (pt fully clears hips from bed, pelvis appears largely level.     General Comments        Pertinent Vitals/Pain Pain Assessment: No/denies pain    Home Living                      Prior Function            PT Goals (current goals can now be found in the care plan section) Acute Rehab PT Goals Patient Stated Goal: Regain independent transfers and AMB  PT Goal Formulation: With patient Time For Goal Achievement: 07/15/18 Potential to Achieve Goals: Fair Progress towards PT goals: Progressing toward goals    Frequency    7X/week      PT Plan Current plan remains appropriate    Co-evaluation              AM-PAC PT "6 Clicks" Mobility   Outcome Measure  Help needed turning from your back to your side while in a flat bed without using  bedrails?: None Help needed moving from lying on your back to sitting on the side of a flat bed without using bedrails?: A Little Help needed moving to and from a bed to a chair (including a wheelchair)?: A Little Help needed standing up from a chair using your arms (e.g., wheelchair or bedside chair)?: A Little Help needed to walk in hospital room?: A Lot Help needed climbing 3-5 steps with a railing? : Total 6 Click Score: 16    End of Session Equipment Utilized During Treatment: Left knee immobilizer Activity Tolerance: Patient tolerated treatment well Patient left: with call bell/phone within reach;in bed;with bed alarm set Nurse Communication: Mobility status;Other (comment) PT Visit Diagnosis: Unsteadiness on feet (R26.81);Difficulty in walking, not elsewhere classified (R26.2);Dizziness and giddiness (R42);Hemiplegia and hemiparesis Hemiplegia - Right/Left: Left Hemiplegia - dominant/non-dominant: Dominant Hemiplegia - caused by: Cerebral infarction     Time: 1937-9024 PT Time Calculation (min) (ACUTE ONLY): 24 min  Charges:  $Gait Training: 8-22 mins $  Neuromuscular Re-education: 8-22 mins                     2:25 PM, 07/02/18 Etta Grandchild, PT, DPT Physical Therapist - [facility]  [phone] ([place])     , C 07/02/2018, 2:16 PM

## 2018-07-02 NOTE — TOC Progression Note (Signed)
Transition of Care (TOC) - Progression Note    Patient Details  Name: Nathan [name]. MRN: [id number] Date of Birth: [birth year]  Transition of Care [facility]) CM/SW [place], [place] Phone Number: 07/02/2018, 3:28 PM  Clinical Narrative:  Patient has had no bed offers for today. CSW notified MD that patient will have to stay until he can receive bed offers. CSW will continue to follow for discharge planning.      Expected Discharge Plan: Skilled Nursing Facility Barriers to Discharge: SNF Pending bed offer  Expected Discharge Plan and Services Expected Discharge Plan: [place] arrangements for the past 2 months: Single Family Home Expected Discharge Date: 07/02/18                         Social Determinants of Health (SDOH) Interventions    Readmission Risk Interventions No flowsheet data found.

## 2018-07-02 NOTE — Progress Notes (Signed)
Progress Note  Patient Name: Nathan [name]. Date of Encounter: 07/02/2018  Primary Cardiologist: Virl Axe, MD   Subjective   He denied chest pain, palpitations, or racing heart rate. No SOB. Continued L sided weakness and facial droop. He reported he is eager to go home.  Reviewed plan for anticoagulation. He reported he will take the NOAC medication as prescribed. He stated he has not yet received a coupon for Eliquis. He has the [facility] number stored in his phone if questions / concerns re: any future paperwork.   Inpatient Medications    Scheduled Meds: . apixaban  5 mg Oral BID  . furosemide  20 mg Oral Daily  . losartan  100 mg Oral Daily  . metoprolol succinate  50 mg Oral Daily  . pantoprazole  40 mg Oral Q0600  . potassium chloride SA  20 mEq Oral Daily  . spironolactone  25 mg Oral Daily   Continuous Infusions:  PRN Meds: acetaminophen **OR** acetaminophen (TYLENOL) oral liquid 160 mg/5 mL **OR** acetaminophen   Vital Signs    Vitals:   07/02/18 0615 07/02/18 0841 07/02/18 0924 07/02/18 1230  BP: 116/75 113/86  114/71  Pulse: (!) 56 (!) 55 65 64  Resp: 16   16  Temp:  (!) 97.3 F (36.3 C)  97.6 F (36.4 C)  TempSrc:  Axillary  Oral  SpO2: 100% 97%  98%  Weight:      Height:        Intake/Output Summary (Last 24 hours) at 07/02/2018 1344 Last data filed at 07/02/2018 1300 Gross per 24 hour  Intake 480 ml  Output 1500 ml  Net -1020 ml   Filed Weights   06/30/18 1443 06/30/18 2008  Weight: 106.1 kg 101.6 kg    Telemetry    NSR 50-60s with short run of NSVT (6 beats) at ~90bpm around 1PM. - Personally Reviewed  ECG    No recent EKG per EMR- Personally Reviewed  Physical Exam   GEN: No acute distress.  Sitting in chair. Eating lunch and watching television. Neck: Unable to assess d/t sitting in chair  Cardiac: RRR, no murmurs, rubs, or gallops.  Respiratory: Clear to auscultation bilaterally. GI: Soft, nontender, non-distended  MS:  No lower extremity edema; No deformity. L leg in brace d/t weakness s/p stroke. Neuro:  Left sided facial droop, left sided weakness Psych: Normal affect   Labs    Chemistry Recent Labs  Lab 06/30/18 1501 07/02/18 0258  NA 140  --   K 4.4 3.8  CL 106  --   CO2 23  --   GLUCOSE 116*  --   BUN 22  --   CREATININE 1.41*  --   CALCIUM 9.7  --   PROT 8.4*  --   ALBUMIN 4.1  --   AST 17  --   ALT 13  --   ALKPHOS 53  --   BILITOT 0.4  --   GFRNONAA 51*  --   GFRAA 59*  --   ANIONGAP 11  --      Hematology Recent Labs  Lab 06/30/18 1501  WBC 5.3  RBC 4.72  HGB 12.5*  HCT 38.1*  MCV 80.7  MCH 26.5  MCHC 32.8  RDW 17.0*  PLT 264    Cardiac Enzymes Recent Labs  Lab 06/30/18 1501  TROPONINI <0.03   No results for input(s): TROPIPOC in the last 168 hours.   BNPNo results for input(s): BNP, PROBNP in the last  168 hours.   DDimer No results for input(s): DDIMER in the last 168 hours.   Radiology    Ct Head Wo Contrast  Result Date: 06/30/2018 CLINICAL DATA:  Left-sided weakness and right-sided facial droop EXAM: CT HEAD WITHOUT CONTRAST TECHNIQUE: Contiguous axial images were obtained from the base of the skull through the vertex without intravenous contrast. COMPARISON:  February 07, 2018 FINDINGS: Brain: Mild diffuse atrophy is stable. There is no intracranial mass, hemorrhage, extra-axial fluid collection, or midline shift. There is small vessel disease throughout the centra semiovale bilaterally. Small vessel disease is noted in each internal capsule. There is no demonstrable acute infarct. Vascular: There is no appreciable hyperdense vessel. There is calcification in each distal vertebral artery and carotid siphon region. Skull: The bony calvarium appears intact. Sinuses/Orbits: There is mucosal thickening in several ethmoid air cells. Other visualized paranasal sinuses are clear. Orbits appear symmetric bilaterally except for evidence of previous cataract removal  on the left. Other: Mastoid air cells are clear. There is debris in each external auditory canal. IMPRESSION: Atrophy with fairly extensive periventricular small vessel disease. Small vessel disease noted in each internal capsule region. No acute infarct. No mass or hemorrhage. There are foci of arterial vascular calcification. There is mucosal thickening in several ethmoid air cells. There is probable cerumen in each external auditory canal. Electronically Signed   By: Lowella Grip III M.D.   On: 06/30/2018 15:22   Mr Brain Wo Contrast  Result Date: 06/30/2018 CLINICAL DATA:  68 y/o M; left-sided weakness, facial droop, slurred speech. EXAM: MRI HEAD WITHOUT CONTRAST MRA HEAD WITHOUT CONTRAST TECHNIQUE: Multiplanar, multiecho pulse sequences of the brain and surrounding structures were obtained without intravenous contrast. Angiographic images of the head were obtained using MRA technique without contrast. COMPARISON:  06/30/2018 CT head. FINDINGS: MRI HEAD FINDINGS Brain: Reduced diffusion within right paramedian pons measuring 22 x 13 x 10 mm (AP x ML x CC series 2, image 20 and series 4, image 19) compatible with acute/early subacute infarction. No associated hemorrhage or mass effect. The infarct is T2 FLAIR hyperintense. Large confluent nonspecific T2 FLAIR hyperintensities in subcortical and periventricular white matter are compatible with severe chronic microvascular ischemic changes. Moderate volume loss of the brain. Motion degraded susceptibility weighted sequence. No mass effect, extra-axial collection, hydrocephalus, or herniation. Vascular: Normal flow voids. Skull and upper cervical spine: Normal marrow signal. Sinuses/Orbits: Negative. Other: None. MRA HEAD FINDINGS Internal carotid arteries: Patent. Prominent infundibulum of the right inferolateral trunk noted. Anterior cerebral arteries:  Patent. Middle cerebral arteries: Patent. Anterior communicating artery: Patent. Posterior  communicating arteries: Not identified, likely hypoplastic or absent. Posterior cerebral arteries:  Patent.  Mild right P2 stenosis. Basilar artery:  Patent. Vertebral arteries: The right vertebral artery V3/V4 junction is poorly visualized, however this is likely due to artifact which is visible throughout that level in the neck. Left dominant vertebrobasilar system. No evidence of high-grade stenosis, large vessel occlusion, or aneurysm. Lumen irregularity of carotid siphons is compatible with intracranial atherosclerosis. IMPRESSION: 1. Right paramedian pons late acute/early subacute infarction measuring up to 22 mm. No associated hemorrhage or mass effect. 2. Severe chronic microvascular ischemic changes and moderate volume loss of the brain. 3. Patent anterior and posterior intracranial circulation. No large vessel occlusion, aneurysm, or significant stenosis is identified. These results will be called to the ordering clinician or representative by the Radiologist Assistant, and communication documented in the PACS or zVision Dashboard. Electronically Signed   By: Edgardo Roys.D.  On: 06/30/2018 22:35   US Carotid Bilateral (at [facility] And [facility] Only)  Result Date: 07/01/2018 CLINICAL DATA:  Acute right-sided pontine infarct. EXAM: BILATERAL CAROTID DUPLEX ULTRASOUND TECHNIQUE: Pearline Cables scale imaging, color Doppler and duplex ultrasound were performed of bilateral carotid and vertebral arteries in the neck. COMPARISON:  None. FINDINGS: Criteria: Quantification of carotid stenosis is based on velocity parameters that correlate the residual internal carotid diameter with NASCET-based stenosis levels, using the diameter of the distal internal carotid lumen as the denominator for stenosis measurement. The following velocity measurements were obtained: RIGHT ICA:  54/22 cm/sec CCA:  82/50 cm/sec SYSTOLIC ICA/CCA RATIO:  0.7 ECA:  55 cm/sec LEFT ICA:  66/18 cm/sec CCA:  53/97 cm/sec SYSTOLIC ICA/CCA RATIO:   0.9 ECA:  49 cm/sec RIGHT CAROTID ARTERY: Mild scattered calcified plaque is present at the level of the common carotid artery, carotid bulb and proximal right internal carotid artery. Estimated right ICA stenosis is less than 50%. RIGHT VERTEBRAL ARTERY: Antegrade flow with normal waveform and velocity. LEFT CAROTID ARTERY: Mild scattered calcified plaque is present at the level of the distal common carotid artery common carotid bulb and left ICA origin. Estimated left ICA stenosis is less than 50%. LEFT VERTEBRAL ARTERY: Antegrade flow with normal waveform and velocity. IMPRESSION: Mild scattered calcified plaque at the level of bilateral common carotid arteries, carotid bulbs and internal carotid arteries. No significant ICA stenosis identified with estimated bilateral ICA stenoses of less than 50%. Electronically Signed   By: Aletta Edouard M.D.   On: 07/01/2018 09:07   Dg Chest Portable 1 View  Result Date: 06/30/2018 CLINICAL DATA:  Syncope with heart block EXAM: PORTABLE CHEST 1 VIEW COMPARISON:  February 09, 2018 FINDINGS: No edema or consolidation. Heart size and pulmonary vascularity are normal. No adenopathy. There is aortic atherosclerosis. No pneumothorax. No bone lesions. IMPRESSION: No edema or consolidation. Heart size within normal limits. Aortic Atherosclerosis (ICD10-I70.0). Electronically Signed   By: Lowella Grip III M.D.   On: 06/30/2018 17:40   Mr Jodene Nam Head/brain QB Cm  Result Date: 06/30/2018 CLINICAL DATA:  68 y/o M; left-sided weakness, facial droop, slurred speech. EXAM: MRI HEAD WITHOUT CONTRAST MRA HEAD WITHOUT CONTRAST TECHNIQUE: Multiplanar, multiecho pulse sequences of the brain and surrounding structures were obtained without intravenous contrast. Angiographic images of the head were obtained using MRA technique without contrast. COMPARISON:  06/30/2018 CT head. FINDINGS: MRI HEAD FINDINGS Brain: Reduced diffusion within right paramedian pons measuring 22 x 13 x 10 mm  (AP x ML x CC series 2, image 20 and series 4, image 19) compatible with acute/early subacute infarction. No associated hemorrhage or mass effect. The infarct is T2 FLAIR hyperintense. Large confluent nonspecific T2 FLAIR hyperintensities in subcortical and periventricular white matter are compatible with severe chronic microvascular ischemic changes. Moderate volume loss of the brain. Motion degraded susceptibility weighted sequence. No mass effect, extra-axial collection, hydrocephalus, or herniation. Vascular: Normal flow voids. Skull and upper cervical spine: Normal marrow signal. Sinuses/Orbits: Negative. Other: None. MRA HEAD FINDINGS Internal carotid arteries: Patent. Prominent infundibulum of the right inferolateral trunk noted. Anterior cerebral arteries:  Patent. Middle cerebral arteries: Patent. Anterior communicating artery: Patent. Posterior communicating arteries: Not identified, likely hypoplastic or absent. Posterior cerebral arteries:  Patent.  Mild right P2 stenosis. Basilar artery:  Patent. Vertebral arteries: The right vertebral artery V3/V4 junction is poorly visualized, however this is likely due to artifact which is visible throughout that level in the neck. Left dominant vertebrobasilar system. No evidence of high-grade  stenosis, large vessel occlusion, or aneurysm. Lumen irregularity of carotid siphons is compatible with intracranial atherosclerosis. IMPRESSION: 1. Right paramedian pons late acute/early subacute infarction measuring up to 22 mm. No associated hemorrhage or mass effect. 2. Severe chronic microvascular ischemic changes and moderate volume loss of the brain. 3. Patent anterior and posterior intracranial circulation. No large vessel occlusion, aneurysm, or significant stenosis is identified. These results will be called to the ordering clinician or representative by the Radiologist Assistant, and communication documented in the PACS or zVision Dashboard. Electronically Signed    By: Kristine Garbe M.D.   On: 06/30/2018 22:35    Cardiac Studies   TTE 07/02/2018  1. The left ventricle has mild-moderately reduced systolic function, with an ejection fraction of 40-45%. The cavity size was normal. Left ventricular diastolic Doppler parameters are consistent with impaired relaxation. Unable to exclude regional wall  motion abnormality.  2. The right ventricle has normal systolic function. The cavity was normal. There is no increase in right ventricular wall thickness.  3. Negative saline contrast bubble study  4. EF improved compared to prior study in 2019  Patient Profile     68 y.o. male with a h/o atrial flutter s/p ablation (06/2017), PAF (07/2017), NICM, HFrEF (EF 40-45% 07/02/2018), HLD, HTN, PAH, noncompliance, and GIB (01/2018) and who is seen today for recommendations regarding anticoagulation following stroke.  Assessment & Plan    CVA - H/o Afib/flutter s/p rfca in 06/2017, HTN, noncompliance with [place] following 01/2018 GIB. Admitted 4/14 with 1 day h/o L sided weakness, facial droop, slurred speech.  - Workup significant for early subacute infarct.  - SR on admission. Remains in SR.  - Echo updated as above and negative saline contrast bubble study - Cardiology consulted regarding NOAC and Eliquis 5mg  BID recommended with restart per neuro. Continue Eliquis 5mg  BID with PPI  Paroxysmal Atrial flutter s/p ablation (06/2017); PAF (07/2017) - Currently SR. No EKG available this admission.  - Continue PTA BB for rate control. - Continue restarted Eliquis 5mg  BID (cleared by neuro) with PPI. CHA2DS2VASc score of at least 5. As previously noted, he had discontinued [place] d/t concerns regarding 01/2018 GIB; however, he was noted as cleared by GI to resume [place] (discharge summary 02/12/2018). As documented in consult note, he is now willing to resume [place].  - Coupon for 30 day free Eliquis requested. [facility] number provided if need help filling out additional  paperwork for patient assistance when getting medication. Currently unemployed with medicare with [employer] and expects difficulty affording medication.   NICM/HFrEF (EF 40-45%, 07/02/2018) - Echo updated as above with improved EF from 08/2017 echo of EF 35-40%.  - Unclear role of AT contribution to heart failure  - Euvolemic on exam today - Continue medical management with BB, ARB, MRA therapy  HLD - LDL 121. Continue statin therapy, started by IM.  Essential HTN - Stable  H/o GIB (01/2018) - As above. H/H stable. PPI added with [place].   For questions or updates, please contact [place] Please consult [email or web address] for contact info under        Signed, Arvil Chaco, PA-C  07/02/2018, 1:44 PM

## 2018-07-02 NOTE — NC FL2 (Signed)
[place] LEVEL OF CARE SCREENING TOOL     IDENTIFICATION  Patient Name: Nathan [name]. Birthdate: December 08, 1950 Sex: male Admission Date (Current Location): 06/30/2018  [place] and [place] Number:  Engineering geologist and Address:  [facility], [address], [place], [place] [address]      Provider Number: [id number]  Attending Physician Name and Address:  Bettey Costa, MD  Relative Name and Phone Number:       Current Level of Care: Hospital Recommended Level of Care: [place] Prior Approval Number:    Date Approved/Denied:   PASRR Number: [id number]  Discharge Plan: SNF    Current Diagnoses: Patient Active Problem List   Diagnosis Date Noted  . CVA (cerebral vascular accident) ([place]) 06/30/2018  . CLE (columnar lined esophagus)   . Stomach irritation   . Acute posthemorrhagic anemia   . Hyperkalemia   . Dilated cardiomyopathy ([place]) 08/26/2017  . Chronic a-fib 07/15/2017  . Chronic systolic heart failure ([place]) 07/09/2017  . HTN (hypertension) 07/09/2017  . Tobacco use 07/09/2017  . Atrial flutter ([place]) 06/15/2017  . A-fib ([place]) 06/12/2017  . Atrial flutter with rapid ventricular response ([place]) 04/29/2017  . Elevated troponin 04/29/2017  . AKI (acute kidney injury) ([place]) 04/29/2017    Orientation RESPIRATION BLADDER Height & Weight     Self, Time, Place  Normal Continent Weight: 223 lb 14.4 oz (101.6 kg) Height:  6\' 3"  (190.5 cm)  BEHAVIORAL SYMPTOMS/MOOD NEUROLOGICAL BOWEL NUTRITION STATUS  (none) (none) Continent Diet(Heart Healthy )  AMBULATORY STATUS COMMUNICATION OF NEEDS Skin   Extensive Assist Verbally Normal                       Personal Care Assistance Level of Assistance  Bathing, Feeding, Dressing Bathing Assistance: Limited assistance Feeding assistance: Independent Dressing Assistance: Limited assistance     Functional Limitations Info  Sight, Hearing, Speech Sight Info:  Adequate Hearing Info: Adequate Speech Info: Adequate    SPECIAL CARE FACTORS FREQUENCY  PT (By licensed PT), OT (By licensed OT), Speech therapy     PT Frequency: 5 OT Frequency: 5     Speech Therapy Frequency: 3      Contractures Contractures Info: Not present    Additional Factors Info  Code Status, Allergies Code Status Info: Full Code  Allergies Info: NKA           Current Medications (07/02/2018):  This is the current hospital active medication list Current Facility-Administered Medications  Medication Dose Route Frequency Provider Last Rate Last Dose  . acetaminophen (TYLENOL) tablet 650 mg  650 mg Oral Q4H PRN Saundra Shelling, MD       Or  . acetaminophen (TYLENOL) solution 650 mg  650 mg Per Tube Q4H PRN Pyreddy, Reatha Harps, MD       Or  . acetaminophen (TYLENOL) suppository 650 mg  650 mg Rectal Q4H PRN Pyreddy, Reatha Harps, MD      . apixaban (ELIQUIS) tablet 5 mg  5 mg Oral BID Bettey Costa, MD   5 mg at 07/02/18 1002  . furosemide (LASIX) tablet 20 mg  20 mg Oral Daily Pyreddy, Pavan, MD   20 mg at 07/02/18 1002  . losartan (COZAAR) tablet 100 mg  100 mg Oral Daily Pyreddy, Pavan, MD   100 mg at 07/02/18 1001  . metoprolol succinate (TOPROL-XL) 24 hr tablet 50 mg  50 mg Oral Daily Pyreddy, Pavan, MD   50 mg at 07/02/18 1002  .  pantoprazole (PROTONIX) EC tablet 40 mg  40 mg Oral Q0600 Theora Gianotti, NP   40 mg at 07/02/18 5188  . potassium chloride SA (K-DUR,KLOR-CON) CR tablet 20 mEq  20 mEq Oral Daily Pyreddy, Pavan, MD   20 mEq at 07/02/18 1003  . spironolactone (ALDACTONE) tablet 25 mg  25 mg Oral Daily Pyreddy, Reatha Harps, MD   25 mg at 07/02/18 1002     Discharge Medications: Please see discharge summary for a list of discharge medications.  Relevant Imaging Results:  Relevant Lab Results:   Additional Information SSN: [id number]  Annamaria Boots, [place]

## 2018-07-02 NOTE — Progress Notes (Signed)
Inpatient Rehabilitation-Admissions Coordinator   [facility] spoke with pt and his fiance over the phone. Pt understands he needs rehab prior to returning home. Based on pt's current functional status, believe he will need some physical assistance even after CIR. Pt's fiance is unable to provide any physical assist at all and thus pt will need a longer term rehab stay at this time. AC has contacted pt regarding recommendation for SNF and he is agreeable. AC has communicated with CM, [place].   AC will sign off.   Please call if questions.   Jhonnie Garner, OTR/L  Rehab Admissions Coordinator  [phone] 07/02/2018 10:37 AM

## 2018-07-03 ENCOUNTER — Telehealth: Payer: Self-pay | Admitting: Cardiovascular Disease

## 2018-07-03 NOTE — Telephone Encounter (Signed)
[facility] called to schedule ED follow  Consent is pending

## 2018-07-03 NOTE — Care Management Important Message (Signed)
Important Message  Patient Details  Name: Nathan [name]. MRN: [id number] Date of Birth: 1951/01/18   Medicare Important Message Given:  Yes    Dannette Barbara 07/03/2018, 11:13 AM

## 2018-07-03 NOTE — TOC Progression Note (Signed)
Transition of Care (TOC) - Progression Note    Patient Details  Name: Nathan [name]. MRN: [id number] Date of Birth: Jul 17, 1950  Transition of Care [facility]) CM/SW [place], [place] Phone Number: 07/03/2018, 2:04 PM  Clinical Narrative:  CSW presented bed offers to patient and explained that none of those places will have a bed available until Monday. Patient is agreeable to stay until Monday but would like to see if any beds would be available in [facility] at that time CSW has reached out to [place] facilities again to see if anything would be available on Monday. CSW will continue to follow for discharge planning.      Expected Discharge Plan: Skilled Nursing Facility Barriers to Discharge: SNF Pending bed offer  Expected Discharge Plan and Services Expected Discharge Plan: Start arrangements for the past 2 months: Single Family Home Expected Discharge Date: 07/03/18                         Social Determinants of Health (SDOH) Interventions    Readmission Risk Interventions No flowsheet data found.

## 2018-07-03 NOTE — Progress Notes (Signed)
[place] [facility] [place] NAME: Nathan [name]    MR#:  [id number]  DATE OF BIRTH:  Jan 25, 1951  SUBJECTIVE:  Supposed to go to CIR today or SNF   REVIEW OF SYSTEMS:    Review of Systems  Constitutional: Negative for fever, chills weight loss HENT: Negative for ear pain, nosebleeds, congestion, facial swelling, rhinorrhea, neck pain, neck stiffness and ear discharge.   Respiratory: Negative for cough, shortness of breath, wheezing  Cardiovascular: Negative for chest pain, palpitations and leg swelling.  Gastrointestinal: Negative for heartburn, abdominal pain, vomiting, diarrhea or consitpation Genitourinary: Negative for dysuria, urgency, frequency, hematuria Musculoskeletal: Negative for back pain or joint pain Neurological: ++left sided weakness and facial droop Hematological: Does not bruise/bleed easily.  Psychiatric/Behavioral: Negative for hallucinations, confusion, dysphoric mood    Tolerating Diet: yes      DRUG ALLERGIES:  No Known Allergies  VITALS:  Blood pressure (!) 107/52, pulse (!) 58, temperature 98.5 F (36.9 C), temperature source Oral, resp. rate 15, height 6\' 3"  (1.905 m), weight 101.6 kg, SpO2 97 %.  PHYSICAL EXAMINATION:  Constitutional: Appears well-developed and well-nourished. No distress. HENT: Normocephalic. Marland Kitchen Oropharynx is clear and moist.  Eyes: Conjunctivae and EOM are normal. PERRLA, no scleral icterus.  Neck: Normal ROM. Neck supple. No JVD. No tracheal deviation. CVS: RRR, S1/S2 +, no murmurs, no gallops, no carotid bruit.  Pulmonary: Effort and breath sounds normal, no stridor, rhonchi, wheezes, rales.  Abdominal: Soft. BS +,  no distension, tenderness, rebound or guarding.  Musculoskeletal: Normal range of motion. No edema and no tenderness.  Neuro: Alert. Left facial droop with hemiplegia left side Skin: Skin is warm and dry. No rash noted. Psychiatric: Normal mood and affect.      LABORATORY  PANEL:   CBC Recent Labs  Lab 06/30/18 1501  WBC 5.3  HGB 12.5*  HCT 38.1*  PLT 264   ------------------------------------------------------------------------------------------------------------------  Chemistries  Recent Labs  Lab 06/30/18 1501 07/02/18 0258  NA 140  --   K 4.4 3.8  CL 106  --   CO2 23  --   GLUCOSE 116*  --   BUN 22  --   CREATININE 1.41*  --   CALCIUM 9.7  --   AST 17  --   ALT 13  --   ALKPHOS 53  --   BILITOT 0.4  --    ------------------------------------------------------------------------------------------------------------------  Cardiac Enzymes Recent Labs  Lab 06/30/18 1501  TROPONINI <0.03   ------------------------------------------------------------------------------------------------------------------  RADIOLOGY:  No results found.   ASSESSMENT AND PLAN:   68 year old male with history of atrial flutter, combined chronic systolic and diastolic heart failure and hypertension who presented to the emergency room due to left-sided weakness.   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 No associated hemorrhage or mass effectcausing left-sided hemiplegia and left facial droop: Given patient's history of atrial flutter he was started on anticoagulation. He was supposed to be on Xarelto but never started on it.  He prefers Eliquis die to lower GIB profile.  Carotid Doppler without significant stenosis Echocardiogram 1. The left ventricle has mild-moderately reduced systolic function, with an ejection fraction of 40-45%. The cavity size was normal. Left ventricular diastolic Doppler parameters are consistent with impaired relaxation. Unable to exclude regional wall  motion abnormality. 2. The right ventricle has normal systolic function. The cavity was normal. There is no increase in right ventricular wall thickness. 3. Negative saline contrast bubble study 4.  EF improved compared to  prior study in 2019   LDL 121 He will start on statin therapy with goal LDL less than 70  2. Combined chronic systolic and diastolic heart failure ejection fraction 40-45% ECHO 4/20: Patient is euvolemic Continue Lasix,Aldactone and metoprolol    3. History of atrial flutter status post ablation in the past: Cardiology consultation was obtained. He will start on Eliquis and continue metoprolol for heart rate control He will need outpatient follow up with Cardiology.  4. Essential hypertension: Continue metoprolol, Aldactone and losartan  Management plans discussed with the patient and he is in agreement.  CODE STATUS: Full  TOTAL TIME TAKING CARE OF THIS PATIENT: 24 minutes.     POSSIBLE D/C tomorrow, DEPENDING ON CLINICAL CONDITION.   Bettey Costa M.D on 07/02/2018 at 10:13 AM  Between 7am to 6pm - Pager - [phone] After 6pm go to [email or web address] - password [email or web address] [place] [facility]  Office  [phone]  CC: Primary care physician; Patient, [name]  Note: This dictation was prepared with Dragon dictation along with smaller phrase technology. Any transcriptional errors that result from this process are unintentional.

## 2018-07-03 NOTE — Progress Notes (Signed)
Physical Therapy Treatment Patient Details Name: Nathan [name]. MRN: [id number] DOB: [birth year] Today's Date: 07/03/2018    History of Present Illness Nathan [name]  is a 68 y.o. male [facility] c L-sided weakness and facial droop x2 days. MRI + for "right paramedian pons late acute/early subacute infarction measuring up to 22 mm. No associated hemorrhage or mass effect" as well as severe chronic microvascular ischemic changes. 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 PTA    PT Comments    Patient continues to demonstrate good participation with session; however, requires 2-3 people to maintain safety with any/all gait efforts.  Improved mechanics and gait abilities with +2 HHA this date; max/dep assist for L LE/foot placement and L hip/knee control in loading phases of gait cycle. Very unsteady, unsafe to utilize as primary mechanism of mobility or to attempt with <2-3 people assist.  May benefit from manual [facility] if patient chooses to discharge home from hospital (not recommended). Extended conversation regarding deficits and functional implications/level of assist required at home, benefits of frequent and skilled rehab services, concerns over patient desire/plan to return home at this time.  Patient nods in agreement with therapist intermittently, but continues to voice desire to "just go home".  Tearful at times towards end of session; provided with support/encouragement as appropriate.   Patient suffers from L hemiparesis due to CVA which impairs his ability to perform daily activities like toileting, feeding, dressing, grooming, bathing in the home. A cane, walker, crutch will not resolve the patient's issue with performing activities of daily living. A wheelchair is required/recommended and will allow patient to safely perform daily activities.   Patient can safely propel the wheelchair in  the home or has a caregiver who can provide assistance.     Follow Up Recommendations  CIR;Supervision/Assistance - 24 hour;Supervision for mobility/OOB     Equipment Recommendations       Recommendations for Other Services       Precautions / Restrictions Precautions Precautions: Fall Restrictions Weight Bearing Restrictions: No    Mobility  Bed Mobility Overal bed mobility: Needs Assistance Bed Mobility: Supine to Sit     Supine to sit: Min guard     General bed mobility comments: assist for truncal elevation and position/protection of L hemi-body; significant effort required to complete task  Transfers Overall transfer level: Needs assistance Equipment used: 2 person hand held assist Transfers: Sit to/from Stand Sit to Stand: Max assist;+2 physical assistance         General transfer comment: heavy use of R hemi-body to assist with movement transitions; max facilitation for closed-chain WBing/activation of L LE, limited active use with sit/stand  Ambulation/Gait Ambulation/Gait assistance: Max assist;+2 physical assistance Gait Distance (Feet): 10 Feet(x2) Assistive device: 2 person hand held assist       General Gait Details: 3-point, step to gait pattern; max/dep manual cuing for R anterior/lateral weight shift, trunk control and pelvic position.  With adequate weight shift, able to indep initiate advamcement of L LE, but requires total assist (of third person) for placement (due to adduct tone), as well as hip/knee control in loading phases of gait.  Very unsteady, unsafe to utilize as primary mechanism of mobility or to attempt with <2-3 people assist. Overall gait pattern and indep ability improved with second trial, given increased cadence facilitated during trial.   Stairs  Wheelchair Mobility    Modified Rankin (Stroke Patients Only)       Balance Overall balance assessment: Needs assistance Sitting-balance support: No upper  extremity supported;Feet supported Sitting balance-Leahy Scale: Good     Standing balance support: Bilateral upper extremity supported Standing balance-Leahy Scale: Zero                              Cognition Arousal/Alertness: Awake/alert Behavior During Therapy: WFL for tasks assessed/performed Overall Cognitive Status: Within Functional Limits for tasks assessed                                 General Comments: limited insight into depth of deficits and functional implications outside of hospital setting      Exercises Other Exercises Other Exercises: Grossly 2- to 2+/5 ROM throughout L UE; significant compensatory strategies noted with gross flexor synergy noted at times. Other Exercises: Extended conversation regarding deficits and functional implications/level of assist required at home, benefits of frequent and skilled rehab services, concerns over patient desire/plan to return home at this time.  Patient nods in agreement with therapist intermittently, but continues to voice desire to "just go home".  Tearful at times towards end of session; provided with support/encouragement as appropriate.    General Comments        Pertinent Vitals/Pain Pain Assessment: No/denies pain    Home Living                      Prior Function            PT Goals (current goals can now be found in the care plan section) Acute Rehab PT Goals Patient Stated Goal: Regain independent transfers and AMB  PT Goal Formulation: With patient Time For Goal Achievement: 07/15/18 Potential to Achieve Goals: Fair Progress towards PT goals: Progressing toward goals    Frequency    7X/week      PT Plan Current plan remains appropriate    Co-evaluation              AM-PAC PT "6 Clicks" Mobility   Outcome Measure  Help needed turning from your back to your side while in a flat bed without using bedrails?: A Little Help needed moving from lying on  your back to sitting on the side of a flat bed without using bedrails?: A Little Help needed moving to and from a bed to a chair (including a wheelchair)?: A Lot Help needed standing up from a chair using your arms (e.g., wheelchair or bedside chair)?: A Lot Help needed to walk in hospital room?: A Lot Help needed climbing 3-5 steps with a railing? : Total 6 Click Score: 13    End of Session Equipment Utilized During Treatment: Gait belt Activity Tolerance: Patient tolerated treatment well Patient left: in chair;with call bell/phone within reach;with chair alarm set Nurse Communication: Mobility status PT Visit Diagnosis: Unsteadiness on feet (R26.81);Difficulty in walking, not elsewhere classified (R26.2);Dizziness and giddiness (R42);Hemiplegia and hemiparesis Hemiplegia - Right/Left: Left Hemiplegia - dominant/non-dominant: Dominant Hemiplegia - caused by: Cerebral infarction     Time: 1517-6160 PT Time Calculation (min) (ACUTE ONLY): 39 min  Charges:  $Gait Training: 8-22 mins $Therapeutic Activity: 8-22 mins $Neuromuscular Re-education: 8-22 mins                     Blondine Hottel H. Owens Shark,  PT, DPT, NCS 07/03/18, 2:02 PM [phone]

## 2018-07-03 NOTE — Progress Notes (Signed)
Received call from Dr. Benjie Karvonen to discontinue discharge order.

## 2018-07-03 NOTE — Progress Notes (Signed)
Occupational Therapy Treatment Patient Details Name: Nathan [name]. MRN: [id number] DOB: [birth year] Today's Date: 07/03/2018    History of present illness Nathan [name]  is a 68 y.o. male presents to [facility] 4/14 c L-sided weakness and facial droop x2 days. MRI + for "right paramedian pons late acute/early subacute infarction measuring up to 22 mm. No associated hemorrhage or mass effect" as well as severe chronic microvascular ischemic changes. 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    OT comments  Pt seen for OT tx this date. Pt required +2-3 heavy physical assist for STS/SPT from recliner to [facility] and facilitation of LLE. Pt able to perform pericare in standing with set up of wipe using non-dom R hand, with +2 physical assist for standing. AAROM of LUE with progress noted with AROM in GE position. Pt tearful at times with situation. Active listening and emotional support provided. Pt appreciative. Pt progressing towards goals, continues to benefit from skilled, intensive OT services.    Follow Up Recommendations  CIR    Equipment Recommendations  Other (comment)(drop arm bariatric BSC)    Recommendations for Other Services      Precautions / Restrictions Precautions Precautions: Fall Precaution Comments: Protect LUE from sublulxation at rest Restrictions Weight Bearing Restrictions: No       Mobility Bed Mobility Overal bed mobility: Needs Assistance Bed Mobility: Sit to Supine     Sit to supine: Min assist   General bed mobility comments: Min A for LLE mgt back to bed, pt able to use RLE to bridge to help with positioning  Transfers Overall transfer level: Needs assistance Equipment used: 2 person hand held assist Transfers: Sit to/from Stand Sit to Stand: Max assist;+2 physical assistance Stand pivot transfers: Max assist;+2 safety/equipment       General transfer comment:  +2-3 heavy assist with max facilitation of LLE    Balance Overall balance assessment: Needs assistance Sitting-balance support: No upper extremity supported;Feet supported Sitting balance-Leahy Scale: Good   Postural control: Left lateral lean Standing balance support: Bilateral upper extremity supported Standing balance-Leahy Scale: Zero Standing balance comment: requires RUE support on hemiwalker                           ADL either performed or assessed with clinical judgement   ADL Overall ADL's : Needs assistance/impaired                         Toilet Transfer: +2 for physical assistance;BSC;Stand-pivot Toilet Transfer Details (indicate cue type and reason): +2-3 for STS and SPT to [facility] Toileting- Clothing Manipulation and Hygiene: +2 for physical assistance;Sit to/from stand Toileting - Clothing Manipulation Details (indicate cue type and reason): +2 for STS with set up for wet wipe for R (non-dom) hand to perform pericare             Vision Baseline Vision/History: Wears glasses Wears Glasses: At all times Patient Visual Report: No change from baseline     Perception     Praxis      Cognition Arousal/Alertness: Awake/alert Behavior During Therapy: WFL for tasks assessed/performed Overall Cognitive Status: Within Functional Limits for tasks assessed  Exercises Other Exercises Other Exercises: AAROM of LUE with 2-/5 and active weightbearing     Shoulder Instructions       General Comments      Pertinent Vitals/ Pain       Pain Assessment: No/denies pain  Home Living                                          Prior Functioning/Environment              Frequency  Min 3X/week        Progress Toward Goals  OT Goals(current goals can now be found in the care plan section)  Progress towards OT goals: Progressing toward goals  Acute Rehab OT  Goals Patient Stated Goal: Regain independent transfers and AMB  OT Goal Formulation: With patient Time For Goal Achievement: 07/15/18 Potential to Achieve Goals: Good  Plan Discharge plan remains appropriate;Frequency remains appropriate    Co-evaluation                 AM-PAC OT "6 Clicks" Daily Activity     Outcome Measure   Help from another person eating meals?: A Little Help from another person taking care of personal grooming?: A Little Help from another person toileting, which includes using toliet, bedpan, or urinal?: A Lot Help from another person bathing (including washing, rinsing, drying)?: A Lot Help from another person to put on and taking off regular upper body clothing?: A Little Help from another person to put on and taking off regular lower body clothing?: A Lot 6 Click Score: 15    End of Session Equipment Utilized During Treatment: Gait belt  OT Visit Diagnosis: Other symptoms and signs involving the nervous system (R29.898);Hemiplegia and hemiparesis Hemiplegia - Right/Left: Left Hemiplegia - dominant/non-dominant: Dominant Hemiplegia - caused by: Cerebral infarction   Activity Tolerance Patient tolerated treatment well   Patient Left in bed;with bed alarm set;with call bell/phone within reach   Nurse Communication          Time: 1545-1630 OT Time Calculation (min): 45 min  Charges: OT General Charges $OT Visit: 1 Visit OT Treatments $Self Care/Home Management : 23-37 mins $Neuromuscular Re-education: 8-22 mins  Jeni Salles, MPH, MS, OTR/L ascom [phone] 07/03/18, 4:53 PM

## 2018-07-04 NOTE — Progress Notes (Signed)
[facility] [place] NAME: Nathan [name]    MR#:  [id number]  DATE OF BIRTH:  04-May-1950  SUBJECTIVE:  No issues this am Still with left hemiplegia no change  REVIEW OF SYSTEMS:    Review of Systems  Constitutional: Negative for fever, chills weight loss HENT: Negative for ear pain, nosebleeds, congestion, facial swelling, rhinorrhea, neck pain, neck stiffness and ear discharge.   Respiratory: Negative for cough, shortness of breath, wheezing  Cardiovascular: Negative for chest pain, palpitations and leg swelling.  Gastrointestinal: Negative for heartburn, abdominal pain, vomiting, diarrhea or consitpation Genitourinary: Negative for dysuria, urgency, frequency, hematuria Musculoskeletal: Negative for back pain or joint pain Neurological: ++left sided weakness and facial droop Hematological: Does not bruise/bleed easily.  Psychiatric/Behavioral: Negative for hallucinations, confusion, dysphoric mood    Tolerating Diet: yes      DRUG ALLERGIES:  No Known Allergies  VITALS:  Blood pressure 134/79, pulse 62, temperature (!) 97.4 F (36.3 C), temperature source Oral, resp. rate 18, height 6\' 3"  (1.905 m), weight 101.6 kg, SpO2 100 %.  PHYSICAL EXAMINATION:  Constitutional: Appears well-developed and well-nourished. No distress. HENT: Normocephalic. Marland Kitchen Oropharynx is clear and moist.  Eyes: Conjunctivae and EOM are normal. PERRLA, no scleral icterus.  Neck: Normal ROM. Neck supple. No JVD. No tracheal deviation. CVS: RRR, S1/S2 +, no murmurs, no gallops, no carotid bruit.  Pulmonary: Effort and breath sounds normal, no stridor, rhonchi, wheezes, rales.  Abdominal: Soft. BS +,  no distension, tenderness, rebound or guarding.  Musculoskeletal: Normal range of motion. No edema and no tenderness.  Neuro: Alert. Left facial droop with hemiplegia left side Skin: Skin is warm and dry. No rash noted. Psychiatric: Normal mood and affect.       LABORATORY PANEL:   CBC Recent Labs  Lab 06/30/18 1501  WBC 5.3  HGB 12.5*  HCT 38.1*  PLT 264   ------------------------------------------------------------------------------------------------------------------  Chemistries  Recent Labs  Lab 06/30/18 1501 07/02/18 0258  NA 140  --   K 4.4 3.8  CL 106  --   CO2 23  --   GLUCOSE 116*  --   BUN 22  --   CREATININE 1.41*  --   CALCIUM 9.7  --   AST 17  --   ALT 13  --   ALKPHOS 53  --   BILITOT 0.4  --    ------------------------------------------------------------------------------------------------------------------  Cardiac Enzymes Recent Labs  Lab 06/30/18 1501  TROPONINI <0.03   ------------------------------------------------------------------------------------------------------------------  RADIOLOGY:  No results found.   ASSESSMENT AND PLAN:   68 year old male with history of atrial flutter, combined chronic systolic and diastolic heart failure and hypertension who presented to the emergency room due to left-sided weakness.   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 No associated hemorrhage or mass effectcausing left-sided hemiplegia and left facial droop: Given patient's history of atrial flutter he was started on anticoagulation. He was supposed to be on Xarelto but never started on it.  He prefers Eliquis due to lower GIB profile.  Carotid Doppler without significant stenosis Echocardiogram 1. The left ventricle has mild-moderately reduced systolic function, with an ejection fraction of 40-45%. The cavity size was normal. Left ventricular diastolic Doppler parameters are consistent with impaired relaxation. Unable to exclude regional wall  motion abnormality. 2. The right ventricle has normal systolic function. The cavity was normal. There is no increase in right ventricular wall thickness. 3. Negative saline contrast bubble study 4.  EF  improved compared to prior study in 2019   LDL 121 He will continue statin therapy with goal LDL less than 70  2. Combined chronic systolic and diastolic heart failure ejection fraction 40-45% ECHO 4/20: Patient is euvolemic Continue Lasix,Aldactone and metoprolol    3. History of atrial flutter status post ablation in the past:  He will start on Eliquis and continue metoprolol for heart rate control He will need outpatient follow up with Cardiology.  4. Essential hypertension: Continue metoprolol, Aldactone and losartan  Management plans discussed with the patient and he is in agreement.  CODE STATUS: Full  TOTAL TIME TAKING CARE OF THIS PATIENT: 25 minutes.     POSSIBLE D/C Monday SNF , DEPENDING ON INSURANCE   Bettey Costa M.D on 07/02/2018 at 10:07 AM  Between 7am to 6pm - Pager - [phone] After 6pm go to [email or web address] - password [email or web address] [facility]  Office  [phone]  CC: Primary care physician; Patient, [name]  Note: This dictation was prepared with Dragon dictation along with smaller phrase technology. Any transcriptional errors that result from this process are unintentional.

## 2018-07-04 NOTE — Progress Notes (Signed)
12 lead EKG ordered.  Dr Benjie Karvonen aware and reviewing all EKG results and specifically the EKG results at 13:20 pm today.

## 2018-07-04 NOTE — Plan of Care (Signed)
  Problem: Education: Goal: Knowledge of disease or condition will improve Outcome: Progressing Goal: Knowledge of secondary prevention will improve Outcome: Progressing Goal: Knowledge of patient specific risk factors addressed and post discharge goals established will improve Outcome: Progressing   Problem: Coping: Goal: Will verbalize positive feelings about self Outcome: Progressing   Problem: Self-Care: Goal: Ability to participate in self-care as condition permits will improve Outcome: Progressing Goal: Ability to communicate needs accurately will improve Outcome: Progressing   Problem: Nutrition: Goal: Risk of aspiration will decrease Outcome: Progressing   Problem: Ischemic Stroke/TIA Tissue Perfusion: Goal: Complications of ischemic stroke/TIA will be minimized Outcome: Progressing   Problem: Pain Managment: Goal: General experience of comfort will improve Outcome: Progressing   Problem: Safety: Goal: Ability to remain free from injury will improve Outcome: Progressing   Problem: Skin Integrity: Goal: Risk for impaired skin integrity will decrease Outcome: Progressing

## 2018-07-04 NOTE — Progress Notes (Signed)
Physical Therapy Treatment Patient Details Name: Nathan [name]. MRN: [id number] DOB: May 05, 1950 Today's Date: 07/04/2018    History of Present Illness Nathan [name]  is a 68 y.o. male presents to [facility] 4/14 c L-sided weakness and facial droop x2 days. MRI + for "right paramedian pons late acute/early subacute infarction measuring up to 22 mm. No associated hemorrhage or mass effect" as well as severe chronic microvascular ischemic changes. 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     PT Comments    Patient agreeable to PT, very motivated to participate with therapy. Patient performed 3 sets of 10 of weight shifts, constant mod-maxAx2 for safety, as well as blocking of L knee. Sit <> stand prior to ea set of weight shifts maxAx2 to facilitate proper LLE positioning/blocking. Several trials of ambulation this session. Pt tolerated increased distance well, fatigued after ~ total of 15ft. MaxAx2 with constant assist for LLE due to adductor tone and to prevent L knee buckling. Improved L foot clearance this session with increased time for patient to accomplish, shoes in place, and step by step cues for sequencing of movements. The patient would benefit from further skilled PT to continue to maximize mobility and progress towards goals.     Follow Up Recommendations  CIR;Supervision/Assistance - 24 hour;Supervision for mobility/OOB     Equipment Recommendations  Other (comment)    Recommendations for Other Services       Precautions / Restrictions Precautions Precautions: Fall Precaution Comments: Protect LUE from sublulxation at rest Restrictions Weight Bearing Restrictions: No    Mobility  Bed Mobility Overal bed mobility: Needs Assistance Bed Mobility: Sit to Supine     Supine to sit: Min guard        Transfers Overall transfer level: Needs assistance Equipment used: 2 person hand  held assist Transfers: Sit to/from Stand Sit to Stand: Max assist;+2 physical assistance            Ambulation/Gait Ambulation/Gait assistance: Max assist;+2 physical assistance Gait Distance (Feet): 35 Feet(x12 x 3ft) Assistive device: 2 person hand held assist       General Gait Details: 3-point, step to gait pattern; max/dep manual cuing for R anterior/lateral weight shift, trunk control and pelvic position.  With adequate weight shift, able to indep initiate advamcement of L LE, but requires total assist (of third person) for placement (due to adduct tone), as well as hip/knee control in loading phases of gait.  Very unsteady, unsafe, though improved foot clearance noted this session.   Stairs             Wheelchair Mobility    Modified Rankin (Stroke Patients Only)       Balance Overall balance assessment: Needs assistance Sitting-balance support: No upper extremity supported;Feet supported Sitting balance-Leahy Scale: Good       Standing balance-Leahy Scale: Zero                              Cognition Arousal/Alertness: Awake/alert Behavior During Therapy: WFL for tasks assessed/performed Overall Cognitive Status: Within Functional Limits for tasks assessed                                 General Comments: limited insight into depth of deficits and functional implications outside of hospital setting      Exercises Other  Exercises Other Exercises: R shoulder flexion AAROM x10, R elbow flexion/extension AAROM. Other Exercises: Pt became tearful during session, able to cheer up and motivate patient  Other Exercises: standing weight shifts with maxAx2 handheld assist. 3 sets of 10 constant block of L knee and verbal/tactile cues to encourageL knee extension.    General Comments        Pertinent Vitals/Pain Pain Assessment: No/denies pain    Home Living                      Prior Function            PT Goals  (current goals can now be found in the care plan section) Acute Rehab PT Goals Patient Stated Goal: Regain independent transfers and AMB  Progress towards PT goals: Progressing toward goals    Frequency    7X/week      PT Plan Current plan remains appropriate    Co-evaluation              AM-PAC PT "6 Clicks" Mobility   Outcome Measure  Help needed turning from your back to your side while in a flat bed without using bedrails?: A Little Help needed moving from lying on your back to sitting on the side of a flat bed without using bedrails?: A Little Help needed moving to and from a bed to a chair (including a wheelchair)?: A Lot Help needed standing up from a chair using your arms (e.g., wheelchair or bedside chair)?: A Lot Help needed to walk in hospital room?: A Lot Help needed climbing 3-5 steps with a railing? : Total 6 Click Score: 13    End of Session Equipment Utilized During Treatment: Gait belt Activity Tolerance: Patient tolerated treatment well Patient left: in chair;with call bell/phone within reach;with chair alarm set Nurse Communication: Mobility status PT Visit Diagnosis: Unsteadiness on feet (R26.81);Difficulty in walking, not elsewhere classified (R26.2);Dizziness and giddiness (R42);Hemiplegia and hemiparesis Hemiplegia - Right/Left: Left Hemiplegia - dominant/non-dominant: Dominant Hemiplegia - caused by: Cerebral infarction     Time: 8466-5993 PT Time Calculation (min) (ACUTE ONLY): 48 min  Charges:  $Gait Training: 23-37 mins $Neuromuscular Re-education: 8-22 mins                     Lieutenant Diego PT, DPT 12:50 PM,07/04/18 [phone]

## 2018-07-04 NOTE — Progress Notes (Signed)
Occupational Therapy Treatment Patient Details Name: Nathan [name]. MRN: [id number] DOB: [birth year] Today's Date: 07/04/2018    History of present illness Nathan [name]  is a 68 y.o. male presents to [facility] 4/14 c L-sided weakness and facial droop x2 days. MRI + for "right paramedian pons late acute/early subacute infarction measuring up to 22 mm. No associated hemorrhage or mass effect" as well as severe chronic microvascular ischemic changes. 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    OT comments  Pt. performed AAROM for the LUE with active initiation for scapular elevation, horizontal abduction/adduction, elbow flexion, extension, forearm supination, pronation, and gross digit flexion extension. Pt. worked on [employer] for hand to face pattern, and hand to mouth patterns with hand-over-hand assist. Pt. Education was provided about self ROM, as well increasing opportunities to engage the LUE during daily tasks. Pt. continues to benefit from OT services for ADL training, A/E training, neuromuscular reeducation, positioning, and pt. education about home modification, and DME. Pt. Continues to be appropriate for CIR,a nd would benefit from continued OT services upon discharge.   Follow Up Recommendations  CIR    Equipment Recommendations  Other (comment)    Recommendations for Other Services Rehab consult    Precautions / Restrictions Precautions Precautions: Fall Precaution Comments: Protect LUE from sublulxation at rest Restrictions Weight Bearing Restrictions: No       Mobility  Bed Mobility   Pt. Was assisted with repositioning    Transfers                                     ADL either performed or assessed with clinical judgement   ADL Overall ADL's : Needs assistance/impaired     Grooming: Set up;Sitting;Minimal assistance   Upper Body Bathing: Sitting;Moderate  assistance;With adaptive equipment   Lower Body Bathing: Maximal assistance   Upper Body Dressing : Sitting;Minimal assistance   Lower Body Dressing:+2 for safety/equipment;Moderate assistance;Maximal assistance   Toilet Transfer: +2 for physical assistance;BSC;Stand-pivot   Toileting- Clothing Manipulation and Hygiene: +2 for physical assistance;Sit to/from stand   Tub/ Shower Transfer: Set up;Maximal assistance;Moderate assistance;+2 for safety/equipment+2 for physical assistance   Functional mobility during ADLs: +2 for physical assistance;Rolling walker;Maximal assistance       Vision Baseline Vision/History: Wears glasses Wears Glasses: At all times Patient Visual Report: No change from baseline     Perception     Praxis      Cognition Arousal/Alertness: Awake/alert Behavior During Therapy: WFL for tasks assessed/performed Overall Cognitive Status: Within Functional Limits for tasks assessed                                          Exercises     Shoulder Instructions       General Comments      Pertinent Vitals/ Pain       Pain Assessment: No/denies pain  Home Living                                          Prior Functioning/Environment              Frequency  Min 3X/week  Progress Toward Goals  OT Goals(current goals can now be found in the care plan section)     Acute Rehab OT Goals Patient Stated Goal: Regain independent transfers and AMB  OT Goal Formulation: With patient Potential to Achieve Goals: Good  Plan Discharge plan remains appropriate;Frequency remains appropriate    Co-evaluation                 AM-PAC OT "6 Clicks" Daily Activity     Outcome Measure   Help from another person eating meals?: A Little Help from another person taking care of personal grooming?: A Little Help from another person toileting, which includes using toliet, bedpan, or urinal?: A Lot Help from  another person bathing (including washing, rinsing, drying)?: A Lot Help from another person to put on and taking off regular upper body clothing?: A Little Help from another person to put on and taking off regular lower body clothing?: A Lot 6 Click Score: 15    End of Session Equipment Utilized During Treatment: Gait belt  OT Visit Diagnosis: Other symptoms and signs involving the nervous system (R29.898);Hemiplegia and hemiparesis Hemiplegia - Right/Left: Left Hemiplegia - dominant/non-dominant: Dominant Hemiplegia - caused by: Cerebral infarction   Activity Tolerance Patient tolerated treatment well   Patient Left in bed;with bed alarm set;with call bell/phone within reach   Nurse Communication          Time: 0102-7253 OT Time Calculation (min): 22 min  Charges: OT General Charges $OT Visit: 1 Visit OT Treatments $Self Care/Home Management : 8-22 mins  Harrel Carina, MS, OTR/L   Harrel Carina 07/04/2018, 11:40 AM

## 2018-07-05 NOTE — Progress Notes (Signed)
[place] [facility] [place] NAME: Nathan [name]    MR#:  [id number]  DATE OF BIRTH:  11-28-1950  SUBJECTIVE:  Patient with slightly increased mobility of left arm and leg.  Was in junctional rhythm yesterday and asymptomatic  REVIEW OF SYSTEMS:    Review of Systems  Constitutional: Negative for fever, chills weight loss HENT: Negative for ear pain, nosebleeds, congestion, facial swelling, rhinorrhea, neck pain, neck stiffness and ear discharge.   Respiratory: Negative for cough, shortness of breath, wheezing  Cardiovascular: Negative for chest pain, palpitations and leg swelling.  Gastrointestinal: Negative for heartburn, abdominal pain, vomiting, diarrhea or consitpation Genitourinary: Negative for dysuria, urgency, frequency, hematuria Musculoskeletal: Negative for back pain or joint pain Neurological: ++left sided weakness and facial droop Hematological: Does not bruise/bleed easily.  Psychiatric/Behavioral: Negative for hallucinations, confusion, dysphoric mood    Tolerating Diet: yes      DRUG ALLERGIES:  No Known Allergies  VITALS:  Blood pressure (!) 112/54, pulse 67, temperature (!) 97.5 F (36.4 C), temperature source Oral, resp. rate 18, height 6\' 3"  (1.905 m), weight 101.6 kg, SpO2 99 %.  PHYSICAL EXAMINATION:  Constitutional: Appears well-developed and well-nourished. No distress. HENT: Normocephalic. Marland Kitchen Oropharynx is clear and moist.  Eyes: Conjunctivae and EOM are normal. PERRLA, no scleral icterus.  Neck: Normal ROM. Neck supple. No JVD. No tracheal deviation. CVS: RRR, S1/S2 +, no murmurs, no gallops, no carotid bruit.  Pulmonary: Effort and breath sounds normal, no stridor, rhonchi, wheezes, rales.  Abdominal: Soft. BS +,  no distension, tenderness, rebound or guarding.  Musculoskeletal: Normal range of motion. No edema and no tenderness.  Neuro: Alert. Left facial droop with hemiplegia left side (slightly improved  since admission) Skin: Skin is warm and dry. No rash noted. Psychiatric: Normal mood and affect.      LABORATORY PANEL:   CBC Recent Labs  Lab 06/30/18 1501  WBC 5.3  HGB 12.5*  HCT 38.1*  PLT 264   ------------------------------------------------------------------------------------------------------------------  Chemistries  Recent Labs  Lab 06/30/18 1501 07/02/18 0258  NA 140  --   K 4.4 3.8  CL 106  --   CO2 23  --   GLUCOSE 116*  --   BUN 22  --   CREATININE 1.41*  --   CALCIUM 9.7  --   AST 17  --   ALT 13  --   ALKPHOS 53  --   BILITOT 0.4  --    ------------------------------------------------------------------------------------------------------------------  Cardiac Enzymes Recent Labs  Lab 06/30/18 1501  TROPONINI <0.03   ------------------------------------------------------------------------------------------------------------------  RADIOLOGY:  No results found.   ASSESSMENT AND PLAN:   67 year old male with history of atrial flutter, combined chronic systolic and diastolic heart failure and hypertension who presented to the emergency room due to left-sided weakness.   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 No associated hemorrhage or mass effectcausing left-sided hemiplegia and left facial droop: Given patient's history of atrial flutter he was started on anticoagulation. He was supposed to be on Xarelto but never started on it.  He prefers Eliquis due to lower GIB profile.  Carotid Doppler without significant stenosis Echocardiogram 1. The left ventricle has mild-moderately reduced systolic function, with an ejection fraction of 40-45%. The cavity size was normal. Left ventricular diastolic Doppler parameters are consistent with impaired relaxation. Unable to exclude regional wall  motion abnormality. 2. The right ventricle has normal systolic function. The cavity was normal. There is no  increase in right ventricular wall thickness. 3. Negative saline contrast bubble study 4. EF improved compared to prior study in 2019   LDL 121 He will continue statin therapy with goal LDL less than 70  2. Combined chronic systolic and diastolic heart failure ejection fraction 40-45% ECHO 4/20: Patient is euvolemic Continue Lasix,Aldactone and metoprolol    3. History of atrial flutter status post ablation in the past:  He will continue Eliquis and continue metoprolol for heart rate control He will need outpatient follow up with Cardiology. He had episode of junctional rhythm yesterday however normal sinus rhythm today. Continue telemetry  4. Essential hypertension: Continue metoprolol, Aldactone and losartan  Management plans discussed with the patient and he is in agreement.  CODE STATUS: Full  TOTAL TIME TAKING CARE OF THIS PATIENT: 22 minutes.     POSSIBLE D/C Monday SNF , DEPENDING ON INSURANCE   Bettey Costa M.D on 07/02/2018 at 10:11 AM  Between 7am to 6pm - Pager - [phone] After 6pm go to [email or web address] - password [email or web address] [place] [facility]  Office  [phone]  CC: Primary care physician; Patient, [name]  Note: This dictation was prepared with Dragon dictation along with smaller phrase technology. Any transcriptional errors that result from this process are unintentional.

## 2018-07-06 ENCOUNTER — Inpatient Hospital Stay: Payer: Medicare HMO

## 2018-07-06 LAB — RESPIRATORY PANEL BY PCR

## 2018-07-06 LAB — PROCALCITONIN: Procalcitonin: 0.17 ng/mL

## 2018-07-06 LAB — INFLUENZA PANEL BY PCR (TYPE A & B)
Influenza A By PCR: NEGATIVE
Influenza B By PCR: NEGATIVE

## 2018-07-06 LAB — SARS CORONAVIRUS 2 BY RT PCR (HOSPITAL ORDER, PERFORMED IN ~~LOC~~ HOSPITAL LAB): SARS Coronavirus 2: NEGATIVE

## 2018-07-06 NOTE — Care Management Important Message (Signed)
Important Message  Patient Details  Name: Nathan [name]. MRN: [id number] Date of Birth: [birth year]   Medicare Important Message Given:  Yes    Juliann Pulse A [name] 07/06/2018, 12:03 PM

## 2018-07-06 NOTE — Progress Notes (Signed)
Physical Therapy Treatment Patient Details Name: Nathan [name]. MRN: [id number] DOB: 1950/06/03 Today's Date: 07/06/2018    History of Present Illness Nathan [name]  is a 68 y.o. male presents to [facility] 4/14 c L-sided weakness and facial droop x2 days. MRI + for "right paramedian pons late acute/early subacute infarction measuring up to 22 mm. No associated hemorrhage or mass effect" as well as severe chronic microvascular ischemic changes. 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     PT Comments    Pt received in bed, agreeable to PT treatment, recently finished his testing to r/o COVID-19 which was negative. Performed gait training in hallway with +3 support: author assisting with LLE initial contact, TKE, manual tibial block to prevent buckling during RLE swing phase. Pt given extensive verbal cues for HW placement, Rt lateral lean to facilitate swing phase, maintaining Rt knee locking during Left swing phase (worse when fatigued), and maintaining step length appropriate distance to assist with maintaining balance. By the third 50ft section of AMB, pt required very little in cuing for sequencing, much less cuing for [facility] placement, and demonstrated improved Rt lateral lean and LLE swing. Pt has improved RLE stability on HW. Pt does fatigue after each bout, and has more difficulty with postural control and balance. After gait training, pt reports need to void bowel, assisted to [facility] via SPT. Nursing is asked to assist with pericare/cleanup, gown and sock changes, as these activities were unsafe to perform as a sole practitioner. Pt has good seated balance on BSC, but requires moderate assistance to remain standing for several ~90 seconds for full pericare. Pt making good progress overall, showing clear demonstration of learning by performed gait modified with substantial decrease of verbal cues by end of session.     Follow Up Recommendations  SNF;Supervision - Intermittent;Supervision for mobility/OOB;CIR     Equipment Recommendations  Other (comment)    Recommendations for Other Services       Precautions / Restrictions Precautions Precautions: Fall Precaution Comments: Protect LUE from sublulxation at rest Restrictions Weight Bearing Restrictions: No    Mobility  Bed Mobility Overal bed mobility: Modified Independent Bed Mobility: Supine to Sit     Supine to sit: Modified independent (Device/Increase time)     General bed mobility comments: requires significant effort/time, pt using bed rails  Transfers Overall transfer level: Needs assistance Equipment used: Hemi-walker;2 person hand held assist Transfers: Sit to/from Stand Sit to Stand: +2 physical assistance;+2 safety/equipment;Max assist;Min assist;From elevated surface(CGA-minA from elevated EOB; MaxA from chair/BSC) Stand pivot transfers: Max assist       General transfer comment: MaxA SPT chair to/from [facility] for expediency   Ambulation/Gait Ambulation/Gait assistance: +2 safety/equipment;+2 physical assistance;Mod assist Gait Distance (Feet): 15 Feet(3x53ft, seated rest between bouts) Assistive device: 2 person hand held assist;Hemi-walker       General Gait Details: 3-point gait with HW, verbal cues for sequencing for first half, then able to sequence without cues. VC for appropriate HW placement, which he is able to perform without cueing during last bout of 40ft. VC for >Rt lateral lean to faciliatate LLE swing phase. Physical assist for approriate Lt foot placement and manual tibial block to maintain locked Left knee for weight.    Stairs             Wheelchair Mobility    Modified Rankin (Stroke Patients Only) Modified Rankin (Stroke Patients Only) Pre-Morbid Rankin Score: No significant  disability Modified Rankin: Moderately severe disability     Balance Overall balance assessment: Needs  assistance Sitting-balance support: No upper extremity supported Sitting balance-Leahy Scale: Good   Postural control: Left lateral lean Standing balance support: Bilateral upper extremity supported Standing balance-Leahy Scale: Fair(closer to poor once fatigue settles in after AMB) Standing balance comment: requires RUE support on hemiwalker                            Cognition Arousal/Alertness: Awake/alert Behavior During Therapy: WFL for tasks assessed/performed Overall Cognitive Status: Within Functional Limits for tasks assessed                                        Exercises Other Exercises Other Exercises: Pt educated in compensatory dressing strategies for mgt of hemiplegia. Assisted with LB dressing dressing task. +2 mod/max assist to don underwear on this date.     General Comments        Pertinent Vitals/Pain Pain Assessment: No/denies pain    Home Living                      Prior Function            PT Goals (current goals can now be found in the care plan section) Acute Rehab PT Goals Patient Stated Goal: Regain independent transfers and AMB  PT Goal Formulation: With patient Time For Goal Achievement: 07/15/18 Potential to Achieve Goals: Fair Progress towards PT goals: Progressing toward goals    Frequency    7X/week      PT Plan Current plan remains appropriate    Co-evaluation              AM-PAC PT "6 Clicks" Mobility   Outcome Measure  Help needed turning from your back to your side while in a flat bed without using bedrails?: A Little Help needed moving from lying on your back to sitting on the side of a flat bed without using bedrails?: A Little Help needed moving to and from a bed to a chair (including a wheelchair)?: A Lot Help needed standing up from a chair using your arms (e.g., wheelchair or bedside chair)?: A Lot Help needed to walk in hospital room?: A Lot Help needed climbing 3-5  steps with a railing? : Total 6 Click Score: 13    End of Session Equipment Utilized During Treatment: Gait belt Activity Tolerance: Patient tolerated treatment well;No increased pain;Patient limited by fatigue Patient left: in chair;with chair alarm set;with call bell/phone within reach Nurse Communication: Mobility status PT Visit Diagnosis: Unsteadiness on feet (R26.81);Difficulty in walking, not elsewhere classified (R26.2);Dizziness and giddiness (R42);Hemiplegia and hemiparesis Hemiplegia - Right/Left: Left Hemiplegia - dominant/non-dominant: Dominant Hemiplegia - caused by: Cerebral infarction     Time: 1320-1401 PT Time Calculation (min) (ACUTE ONLY): 41 min  Charges:  $Gait Training: 23-37 mins $Therapeutic Activity: 8-22 mins                     7:53 PM, 07/06/18 Etta Grandchild, PT, DPT Physical Therapist - [facility]  [phone] ([place])      , C 07/06/2018, 7:44 PM

## 2018-07-06 NOTE — Progress Notes (Signed)
Occupational Therapy Treatment Patient Details Name: Nathan [name]. MRN: [id number] DOB: [birth year] Today's Date: 07/06/2018    History of present illness Nathan [name]  is a 68 y.o. male presents to [facility] 4/14 c L-sided weakness and facial droop x2 days. MRI + for "right paramedian pons late acute/early subacute infarction measuring up to 22 mm. No associated hemorrhage or mass effect" as well as severe chronic microvascular ischemic changes. 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    OT comments  Pt seen for OT treatment on this date. Upon arrival to room pt awake/alert. Pt seated upright in room recliner with  Pt A&O x 4 and denied pain. Pt agreeable to OT tx. Pt instructed in adapted dressing strategies for hemiplegia as well as safe use of AE for LB dressing. Requiring Mod/Max assist for donning underwear ADL. Pt verbalized understanding amd return demonstrated strategies with VCs and physical assistance. Pt making good progress toward goals. Pt continues to benefit from skilled OT services to maximize return to PLOF and minimize risk of future falls, injury, caregiver burden, and readmission. Will continue to follow POC. Discharge recommendation remains appropriate.    Follow Up Recommendations  CIR    Equipment Recommendations  Other (comment)    Recommendations for Other Services      Precautions / Restrictions Precautions Precautions: Fall Precaution Comments: Protect LUE from sublulxation at rest Restrictions Weight Bearing Restrictions: No       Mobility Bed Mobility                  Transfers Overall transfer level: Needs assistance Equipment used: Hemi-walker;2 person hand held assist Transfers: Sit to/from Stand Sit to Stand: +2 physical assistance;+2 safety/equipment;Mod assist         General transfer comment: Pt stood from room recliner with +2 mod assist  and cues for hand placement. Pt able to establish balance with support from OT/rehab tech.    Balance Overall balance assessment: Needs assistance Sitting-balance support: No upper extremity supported Sitting balance-Leahy Scale: Good   Postural control: Left lateral lean Standing balance support: Bilateral upper extremity supported Standing balance-Leahy Scale: Fair Standing balance comment: requires RUE support on hemiwalker                           ADL either performed or assessed with clinical judgement   ADL                       Lower Body Dressing: Cueing for compensatory techniques;Sit to/from stand;+2 for safety/equipment;Moderate assistance;Maximal assistance Lower Body Dressing Details (indicate cue type and reason): Pt trialed use of LH reacher for LB dressing to don underwear on this date. Pt was able to don underwear using compensatory dressing strategies for hemiplegia with mod assist and VCs for sequencing. +2 max assist for STS transfer and pulling up underwear.                      Vision Baseline Vision/History: Wears glasses Wears Glasses: At all times Patient Visual Report: No change from baseline     Perception     Praxis      Cognition Arousal/Alertness: Awake/alert Behavior During Therapy: WFL for tasks assessed/performed Overall Cognitive Status: Within Functional Limits for tasks assessed  Exercises Other Exercises Other Exercises: Pt educated in compensatory dressing strategies for mgt of hemiplegia. Assisted with LB dressing dressing task. +2 mod/max assist to don underwear on this date.    Shoulder Instructions       General Comments      Pertinent Vitals/ Pain       Pain Assessment: No/denies pain  Home Living                                          Prior Functioning/Environment              Frequency  Min 3X/week         Progress Toward Goals  OT Goals(current goals can now be found in the care plan section)  Progress towards OT goals: Progressing toward goals  Acute Rehab OT Goals Patient Stated Goal: Regain independent transfers and AMB  OT Goal Formulation: With patient Time For Goal Achievement: 07/15/18 Potential to Achieve Goals: Good  Plan Discharge plan remains appropriate;Frequency remains appropriate    Co-evaluation                 AM-PAC OT "6 Clicks" Daily Activity     Outcome Measure   Help from another person eating meals?: A Little Help from another person taking care of personal grooming?: A Little Help from another person toileting, which includes using toliet, bedpan, or urinal?: A Lot Help from another person bathing (including washing, rinsing, drying)?: A Lot Help from another person to put on and taking off regular upper body clothing?: A Little Help from another person to put on and taking off regular lower body clothing?: A Lot 6 Click Score: 15    End of Session Equipment Utilized During Treatment: Gait belt;Other (comment)(Hemi-walker)  OT Visit Diagnosis: Other symptoms and signs involving the nervous system (R29.898);Hemiplegia and hemiparesis Hemiplegia - Right/Left: Left Hemiplegia - dominant/non-dominant: Dominant Hemiplegia - caused by: Cerebral infarction   Activity Tolerance Patient tolerated treatment well   Patient Left in chair;with chair alarm set;with call bell/phone within reach   Nurse Communication          Time: 7915-0569 OT Time Calculation (min): 16 min  Charges: OT General Charges $OT Visit: 1 Visit OT Treatments $Self Care/Home Management : 8-22 mins  Shara Blazing, M.S., OTR/L Ascom: [phone] 07/06/18, 4:09 PM

## 2018-07-06 NOTE — Progress Notes (Addendum)
Physical Therapy Treatment Patient Details Name: Nathan [name]. MRN: [id number] DOB: 1950/07/10 Today's Date: 07/05/2018    History of Present Illness Nathan [name]  is a 68 y.o. male presents to [facility] 4/14 c L-sided weakness and facial droop x2 days. MRI + for "right paramedian pons late acute/early subacute infarction measuring up to 22 mm. No associated hemorrhage or mass effect" as well as severe chronic microvascular ischemic changes. 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     PT Comments    Pt received up in bed, awake, motivated to participate. Attempted trial AFO this date to assess utility in Lt knee stabilization, however unable to don without painful pressure in the lateral mid foot. Manual assistance provided for Lt knee block. Pt demonstrates improved fluency in safe HW use for transfers and quicker establishment of balance after rising. Noted mild increase in hip flexion/extension during bed exercises, however, difficult to see any improvement in quads activation in supine, albeit pt is developing improved (yet still inadequate) strategies to control Left knee buckling in weight bearing. Pt progressing well in general.     Follow Up Recommendations  SNF;Supervision - Intermittent;Supervision for mobility/OOB;CIR     Equipment Recommendations  Other (comment)(defer to receivinbg facility)    Recommendations for Other Services       Precautions / Restrictions Precautions Precautions: Fall Precaution Comments: Protect LUE from sublulxation at rest Restrictions Weight Bearing Restrictions: No    Mobility  Bed Mobility Overal bed mobility: Modified Independent Bed Mobility: Supine to Sit     Supine to sit: Modified independent (Device/Increase time)     General bed mobility comments: requires significant effort/time, pt using bed rails  Transfers Overall transfer level:  Needs assistance Equipment used: 1 person hand held assist;Hemi-walker Transfers: Sit to/from [facility] Sit to Stand: From elevated surface;Mod assist(Performed 10 in session; modA to rise, with knee block, ) Stand pivot transfers: Max assist;+2 physical assistance(knee block provided, cues for stepping sequencing)       General transfer comment: No cues needed for STS regarding HW placement/use. Pt is able to establsih balance >80% of the time if Lt knee is stabilized.   Ambulation/Gait Ambulation/Gait assistance: (deferred this session to work on transfers and balance)               Marine scientist Rankin (Stroke Patients Only)       Balance Overall balance assessment: Needs assistance Sitting-balance support: No upper extremity supported Sitting balance-Leahy Scale: Good       Standing balance-Leahy Scale: Fair Standing balance comment: requires RUE support on hemiwalker(Performance is best when Lt knee block is provided)                            Cognition Arousal/Alertness: Awake/alert Behavior During Therapy: WFL for tasks assessed/performed Overall Cognitive Status: Within Functional Limits for tasks assessed                                        Exercises General Exercises - Lower Extremity Short Arc Quad: Left;10 reps;PROM;Limitations Heel Slides: AAROM;Left;15 reps;Supine;Limitations(pt does >75% of work) Heel Slides Limitations: manual resistance provided for extension phase Hip ABduction/ADduction: Left;10 reps;Supine;Limitations Hip Abduction/Adduction  Limitations: Pt does 25-50% work, noted quick onset fatigue    General Comments        Pertinent Vitals/Pain      Home Living                      Prior Function            PT Goals (current goals can now be found in the care plan section)      Frequency    7X/week      PT Plan  Current plan remains appropriate    Co-evaluation              AM-PAC PT "6 Clicks" Mobility   Outcome Measure  Help needed turning from your back to your side while in a flat bed without using bedrails?: A Little Help needed moving from lying on your back to sitting on the side of a flat bed without using bedrails?: A Little Help needed moving to and from a bed to a chair (including a wheelchair)?: A Lot Help needed standing up from a chair using your arms (e.g., wheelchair or bedside chair)?: A Lot Help needed to walk in hospital room?: A Lot Help needed climbing 3-5 steps with a railing? : Total 6 Click Score: 13    End of Session Equipment Utilized During Treatment: Gait belt;Other (comment)(AFO attempted, but unsuccessful) Activity Tolerance: Patient tolerated treatment well;No increased pain;Patient limited by fatigue Patient left: in chair;with chair alarm set;with call bell/phone within reach(pt declines feet up in chair) Nurse Communication: Mobility status PT Visit Diagnosis: Unsteadiness on feet (R26.81);Difficulty in walking, not elsewhere classified (R26.2);Dizziness and giddiness (R42);Hemiplegia and hemiparesis Hemiplegia - Right/Left: Left Hemiplegia - dominant/non-dominant: Dominant Hemiplegia - caused by: Cerebral infarction     Time:  -     Charges:               07/05/18 1150  PT Treatments  $Neuromuscular Re-education 23-37 mins              8:25 AM, 07/06/18 Etta Grandchild, PT, DPT Physical Therapist - [facility]  [phone] ([place])     Buccola,Allan C 07/06/2018, 8:21 AM

## 2018-07-06 NOTE — Plan of Care (Signed)
  Problem: Education: Goal: Knowledge of disease or condition will improve Outcome: Progressing Goal: Knowledge of secondary prevention will improve Outcome: Progressing Goal: Knowledge of patient specific risk factors addressed and post discharge goals established will improve Outcome: Progressing   Problem: Coping: Goal: Will verbalize positive feelings about self Outcome: Progressing   Problem: Self-Care: Goal: Ability to participate in self-care as condition permits will improve Outcome: Progressing Goal: Ability to communicate needs accurately will improve Outcome: Progressing   Problem: Nutrition: Goal: Risk of aspiration will decrease Outcome: Progressing   Problem: Ischemic Stroke/TIA Tissue Perfusion: Goal: Complications of ischemic stroke/TIA will be minimized Outcome: Progressing   Problem: Pain Managment: Goal: General experience of comfort will improve Outcome: Progressing

## 2018-07-06 NOTE — TOC Progression Note (Signed)
Transition of Care (TOC) - Progression Note    Patient Details  Name: Nathan [name]. MRN: [id number] Date of Birth: 21-Mar-1950  Transition of Care [facility]) CM/SW Contact  Su Hilt, RN Phone Number: 07/06/2018, 4:41 PM  Clinical Narrative:     Currently no beds in a SNF available for this patient  Expected Discharge Plan: [place] Barriers to Discharge: SNF Pending bed offer  Expected Discharge Plan and Services Expected Discharge Plan: [place] arrangements for the past 2 months: Single Family Home Expected Discharge Date: 07/03/18                         Social Determinants of Health (SDOH) Interventions    Readmission Risk Interventions No flowsheet data found.

## 2018-07-06 NOTE — Progress Notes (Deleted)
Physical Therapy Treatment Note:     07/05/18 1150  PT Visit Information  Last PT Received On 07/05/18  Assistance Needed +2  History of Present Illness Nathan [name]  is a 68 y.o. male presents to [facility] 4/14 c L-sided weakness and facial droop x2 days. MRI + for "right paramedian pons late acute/early subacute infarction measuring up to 22 mm. No associated hemorrhage or mass effect" as well as severe chronic microvascular ischemic changes. 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   Precautions  Precautions Fall  Precaution Comments Protect LUE from sublulxation at rest  Restrictions  Weight Bearing Restrictions No  Pain Assessment  Pain Assessment No/denies pain  Cognition  Arousal/Alertness Awake/alert  Behavior During Therapy WFL for tasks assessed/performed  Overall Cognitive Status Within Functional Limits for tasks assessed  Bed Mobility  Overal bed mobility Modified Independent  Bed Mobility Supine to Sit  Supine to sit Modified independent (Device/Increase time)  General bed mobility comments requires significant effort/time, pt using bed rails  Transfers  Overall transfer level Needs assistance  Equipment used 1 person hand held assist;Hemi-walker  Transfers Sit to/from [facility]  Sit to Stand From elevated surface;Mod assist (Performed 10 in session; modA to rise, with knee block, )  Stand pivot transfers Max assist;+2 physical assistance (knee block provided, cues for stepping sequencing)  General transfer comment No cues needed for STS regarding HW placement/use. Pt is able to establsih balance >80% of the time if Lt knee is stabilized.   Ambulation/Gait  Ambulation/Gait assistance  (deferred this session to work on transfers and balance)  Balance  Overall balance assessment Needs assistance  Sitting-balance support No upper extremity supported  Sitting  balance-Leahy Scale Good  Standing balance-Leahy Scale Fair  Standing balance comment requires RUE support on hemiwalker (Performance is best when Lt knee block is provided)  Exercises  Exercises General Lower Extremity  General Exercises - Lower Extremity  Short Arc Quad Left;10 reps;PROM;Limitations  Heel Slides AAROM;Left;15 reps;Supine;Limitations (pt does >75% of work)  Hip ABduction/ADduction Left;10 reps;Supine;Limitations  Short Arc Quad Limitations no twitch noted in quads; strongest attempts result in partial hip flexion/inititiation  Heel Slides Limitations manual resistance provided for extension phase  Hip Abduction/Adduction Limitations Pt does 25-50% work, noted quick onset fatigue  PT - End of Session  Equipment Utilized During Treatment Gait belt;Other (comment) (AFO attempted, but unsuccessful)  Activity Tolerance Patient tolerated treatment well;No increased pain;Patient limited by fatigue  Patient left in chair;with chair alarm set;with call bell/phone within reach (pt declines feet up in chair)  Nurse Communication Mobility status   PT - Assessment/Plan  PT Plan Current plan remains appropriate  PT Visit Diagnosis Unsteadiness on feet (R26.81);Difficulty in walking, not elsewhere classified (R26.2);Dizziness and giddiness (R42);Hemiplegia and hemiparesis  Hemiplegia - Right/Left Left  Hemiplegia - dominant/non-dominant Dominant  Hemiplegia - caused by Cerebral infarction  PT Frequency (ACUTE ONLY) 7X/week  Follow Up Recommendations SNF;Supervision - Intermittent;Supervision for mobility/OOB;CIR  PT equipment Other (comment) (defer to receivinbg facility)  AM-PAC PT "6 Clicks" Mobility Outcome Measure (Version 2)  Help needed turning from your back to your side while in a flat bed without using bedrails? 3  Help needed moving from lying on your back to sitting on the side of a flat bed without using bedrails? 3  Help needed moving to and from a bed to a chair  (including a wheelchair)? 2  Help needed standing up  from a chair using your arms (e.g., wheelchair or bedside chair)? 2  Help needed to walk in hospital room? 2  Help needed climbing 3-5 steps with a railing?  1  6 Click Score 13  Consider Recommendation of Discharge To: CIR/SNF/LTACH  PT Goal Progression  Progress towards PT goals Progressing toward goals  PT Time Calculation  PT Start Time (ACUTE ONLY) 1115  PT Stop Time (ACUTE ONLY) 1145  PT Time Calculation (min) (ACUTE ONLY) 30 min  PT General Charges  $$ ACUTE PT VISIT 1 Visit  PT Treatments  $Neuromuscular Re-education 23-37 mins    8:20 AM, 07/06/18 Etta Grandchild, PT, DPT Physical Therapist - [facility]  [phone] [facility])

## 2018-07-06 NOTE — Progress Notes (Signed)
[facility] NAME: Timarion Agcaoili    MR#:  [id number]  DATE OF BIRTH:  1950/11/12  SUBJECTIVE:  Patient ran low-grade temperature 100.5 at 1:30 AM today reporting cough ,with slightly increased mobility of left arm and leg.  Was in junctional rhythm yesterday and asymptomatic  REVIEW OF SYSTEMS:    Review of Systems  Constitutional: Negative for fever, chills weight loss HENT: Negative for ear pain, nosebleeds, congestion, facial swelling, rhinorrhea, neck pain, neck stiffness and ear discharge.   Respiratory: Negative for cough, shortness of breath, wheezing  Cardiovascular: Negative for chest pain, palpitations and leg swelling.  Gastrointestinal: Negative for heartburn, abdominal pain, vomiting, diarrhea or consitpation Genitourinary: Negative for dysuria, urgency, frequency, hematuria Musculoskeletal: Negative for back pain or joint pain Neurological: ++left sided weakness and facial droop Hematological: Does not bruise/bleed easily.  Psychiatric/Behavioral: Negative for hallucinations, confusion, dysphoric mood    Tolerating Diet: yes      DRUG ALLERGIES:  No Known Allergies  VITALS:  Blood pressure 119/66, pulse 66, temperature (!) 97.4 F (36.3 C), temperature source Oral, resp. rate 18, height 6\' 3"  (1.905 m), weight 101.6 kg, SpO2 100 %.  PHYSICAL EXAMINATION:  Constitutional: Appears well-developed and well-nourished. No distress. HENT: Normocephalic. Marland Kitchen Oropharynx is clear and moist.  Eyes: Conjunctivae and EOM are normal. PERRLA, no scleral icterus.  Neck: Normal ROM. Neck supple. No JVD. No tracheal deviation. CVS: RRR, S1/S2 +, no murmurs, no gallops, no carotid bruit.  Pulmonary: Effort and breath sounds normal, no stridor, rhonchi, wheezes, rales.  Abdominal: Soft. BS +,  no distension, tenderness, rebound or guarding.  Musculoskeletal: Normal range of motion. No edema and no tenderness.  Neuro: Alert. Left  facial droop with hemiplegia left side (slightly improved since admission) Skin: Skin is warm and dry. No rash noted. Psychiatric: Normal mood and affect.      LABORATORY PANEL:   CBC Recent Labs  Lab 06/30/18 1501  WBC 5.3  HGB 12.5*  HCT 38.1*  PLT 264   ------------------------------------------------------------------------------------------------------------------  Chemistries  Recent Labs  Lab 06/30/18 1501 07/02/18 0258  NA 140  --   K 4.4 3.8  CL 106  --   CO2 23  --   GLUCOSE 116*  --   BUN 22  --   CREATININE 1.41*  --   CALCIUM 9.7  --   AST 17  --   ALT 13  --   ALKPHOS 53  --   BILITOT 0.4  --    ------------------------------------------------------------------------------------------------------------------  Cardiac Enzymes Recent Labs  Lab 06/30/18 1501  TROPONINI <0.03   ------------------------------------------------------------------------------------------------------------------  RADIOLOGY:  Dg Chest Port 1 View  Result Date: 07/06/2018 CLINICAL DATA:  68 year old male with cough EXAM: PORTABLE CHEST 1 VIEW COMPARISON:  Prior chest x-ray 06/30/2018 FINDINGS: The lungs are clear and negative for focal airspace consolidation, pulmonary edema or suspicious pulmonary nodule. No pleural effusion or pneumothorax. Cardiac and mediastinal contours are within normal limits. No acute fracture or lytic or blastic osseous lesions. The visualized upper abdominal bowel gas pattern is unremarkable. IMPRESSION: No active disease. Electronically Signed   By: Jacqulynn Cadet M.D.   On: 07/06/2018 10:49     ASSESSMENT AND PLAN:   68 year old male with history of atrial flutter, combined chronic systolic and diastolic heart failure and hypertension who presented to the emergency room due to left-sided weakness.   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 No associated hemorrhage  or mass effectcausing  left-sided hemiplegia and left facial droop: Given patient's history of atrial flutter he was started on anticoagulation. He was supposed to be on Xarelto but never started on it.  He is started on Eliquis as patient prefers Eliquis due to lower GI bleed profile Carotid Doppler without significant stenosis Echocardiogram 1. The left ventricle has mild-moderately reduced systolic function, with an ejection fraction of 40-45%. The cavity size was normal. Left ventricular diastolic Doppler parameters are consistent with impaired relaxation. Unable to exclude regional wall  motion abnormality. 2. The right ventricle has normal systolic function. The cavity was normal. There is no increase in right ventricular wall thickness. 3. Negative saline contrast bubble study 4. EF improved compared to prior study in 2019   LDL 121 He will continue statin therapy with goal LDL less than 70  2. Combined chronic systolic and diastolic heart failure ejection fraction 40-45% ECHO 4/20: Patient is euvolemic Continue Lasix,Aldactone and metoprolol  3. History of atrial flutter status post ablation in the past:  He will continue Eliquis and continue metoprolol for heart rate control He will need outpatient follow up with Cardiology. He had episode of junctional rhythm yesterday however normal sinus rhythm today. Continue telemetry  4. Essential hypertension: Continue metoprolol, Aldactone and losartan  5.  Low-grade fever Chest x-ray negative flu test is negative.  COVID -19 test is negative respiratory panel pending  Will discharge patient if is afebrile for 24 hours  Disposition skilled nursing facility  Management plans discussed with the patient and he is in agreement.  CODE STATUS: Full  TOTAL TIME TAKING CARE OF THIS PATIENT: 33 minutes.     POSSIBLE D/C  SNF , DEPENDING ON INSURANCE   Illene Silver Anabela Crayton M.D on 07/02/2018 at 12:21 PM  Between 7am to 6pm - Pager - [phone] After  6pm go to [email or web address] - password [email or web address] [facility]  Office  [phone]  CC: Primary care physician; Patient, [name]  Note: This dictation was prepared with Dragon dictation along with smaller phrase technology. Any transcriptional errors that result from this process are unintentional.

## 2018-07-07 ENCOUNTER — Ambulatory Visit: Payer: Medicare HMO | Admitting: Family

## 2018-07-07 ENCOUNTER — Telehealth: Payer: Self-pay | Admitting: Internal Medicine

## 2018-07-07 LAB — PROCALCITONIN: Procalcitonin: 0.15 ng/mL

## 2018-07-07 LAB — BASIC METABOLIC PANEL
Anion gap: 7 (ref 5–15)
BUN: 33 mg/dL — ABNORMAL HIGH (ref 8–23)
CO2: 21 mmol/L — ABNORMAL LOW (ref 22–32)
Calcium: 8.9 mg/dL (ref 8.9–10.3)
Chloride: 109 mmol/L (ref 98–111)
Creatinine, Ser: 1.49 mg/dL — ABNORMAL HIGH (ref 0.61–1.24)
GFR calc Af Amer: 55 mL/min — ABNORMAL LOW (ref >60)
GFR calc non Af Amer: 48 mL/min — ABNORMAL LOW (ref >60)
Glucose, Bld: 105 mg/dL — ABNORMAL HIGH (ref 70–99)
Potassium: 4 mmol/L (ref 3.5–5.1)
Sodium: 137 mmol/L (ref 135–145)

## 2018-07-07 MED ORDER — APIXABAN 5 MG PO TABS: 5.0000 mg | ORAL_TABLET | Freq: Two times a day (BID) | ORAL | 0 refills | Status: DC

## 2018-07-07 MED ORDER — PANTOPRAZOLE SODIUM 40 MG PO TBEC: 40.0000 mg | DELAYED_RELEASE_TABLET | Freq: Every day | ORAL | 0 refills | Status: DC

## 2018-07-07 MED ORDER — ATORVASTATIN CALCIUM 80 MG PO TABS: 80.0000 mg | ORAL_TABLET | Freq: Every day | ORAL | 1 refills | Status: DC

## 2018-07-07 NOTE — Progress Notes (Signed)
Patient suffers from stroke and weakness which impairs their ability to perform daily activities like ADLs in the home.  A walker will not resolve the issue with performing activities of daily living. A wheelchair will allow patient to safely perform daily activities. Patient is not able to propel themselves in the home using a standard weight wheelchair due to weakness. Patient can self propel in the lightweight wheelchair.  Accessories: elevating leg rests (ELRs), wheel locks, extensions and anti-tippers. And back cushion.

## 2018-07-07 NOTE — Progress Notes (Signed)
Physical Therapy Treatment Patient Details Name: Nathan [name]. MRN: [id number] DOB: 1951/01/20 Today's Date: 07/07/2018    History of Present Illness Nathan [name]  is a 68 y.o. male presents to [facility] 4/14 c L-sided weakness and facial droop x2 days. MRI + for "right paramedian pons late acute/early subacute infarction measuring up to 22 mm. No associated hemorrhage or mass effect" as well as severe chronic microvascular ischemic changes. 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     PT Comments    Pt seen for PT/OT co-treat.  Pt reporting plan to discharge home although therapy educated pt that PT/OT still recommending STR.  During session pt practiced lateral scooting edge of bed and then lateral scooting to/from drop arm BSC; ADL and self care with toileting.  Pt requiring assist as well as vc's and tactile cues for positioning and technique.  Educated pt on current assist levels and also safe use of manual w/c (pt educated to always have assist with any mobility for safety and also for w/c transfers and w/c mobility).  Also educated pt on safety concerns with transfers and w/c use (ex: locking brakes) and w/c propulsion (d/t L UE and L LE weakness); educated pt to have caregiver push pt in w/c until able to practice with HHPT.  Decreased insight into deficits and functional implications upon discharge noted.  Therapist had already discussed with care management safety concerns regarding discharge home this morning; care management already aware.  Continue to recommend STR.   Follow Up Recommendations  SNF     Equipment Recommendations  Wheelchair (measurements PT);Wheelchair cushion (measurements PT);Other (comment)(Drop arm 3 in 1; hoyer lift)    Recommendations for Other Services       Precautions / Restrictions Precautions Precautions: Fall Precaution Comments: Protect LUE from sublulxation  at rest Restrictions Weight Bearing Restrictions: No    Mobility  Bed Mobility Overal bed mobility: Needs Assistance Bed Mobility: Supine to Sit     Supine to sit: Supervision;HOB elevated Sit to supine: Supervision;HOB elevated   General bed mobility comments: increased effort to perform on own; use of bedrails  Transfers Overall transfer level: Needs assistance Equipment used: None Transfers: Lateral/Scoot Transfers          Lateral/Scoot Transfers: (+1-2 (see below for details)) General transfer comment: min assist x2 lateral scoot to R downhill bed to [facility] (armrest removed; assist required to block L>R knee and L UE for safety; vc's and tactiles cues required for "heads" and "hips" relationship--practiced scooting edge of bed first prior to transfer to [facility] with 2 assist and vc's for technique; bed pad under pt to commode and pt used this to assist with sliding to R as well and R UE grabbing BSC to help pull himself over); min assist x1 lateral scoot uphill to bed to L (assist to block L knee and for overal stability with forward lean and 2 attempts to get over to bed; vc's for technique required)  Ambulation/Gait             General Gait Details: Deferred d/t focus on transfers this session   Stairs             Wheelchair Mobility    Modified Rankin (Stroke Patients Only)       Balance Overall balance assessment: Needs assistance Sitting-balance support: No upper extremity supported Sitting balance-Leahy Scale: Good Sitting balance - Comments: steady static sitting reaching within BOS  with R UE                                    Cognition Arousal/Alertness: Awake/alert Behavior During Therapy: WFL for tasks assessed/performed Overall Cognitive Status: Within Functional Limits for tasks assessed                                 General Comments: Decreased insight into deficits and functional implications upon discharge       Exercises      General Comments   Nursing cleared pt for participation in physical therapy.  Pt agreeable to PT session.      Pertinent Vitals/Pain Pain Assessment: No/denies pain    Home Living                      Prior Function            PT Goals (current goals can now be found in the care plan section) Acute Rehab PT Goals Patient Stated Goal: improve independence PT Goal Formulation: With patient Time For Goal Achievement: 07/15/18 Potential to Achieve Goals: Fair Progress towards PT goals: Progressing toward goals    Frequency    7X/week      PT Plan Current plan remains appropriate    Co-evaluation PT/OT/SLP Co-Evaluation/Treatment: Yes Reason for Co-Treatment: To address functional/ADL transfers;For patient/therapist safety PT goals addressed during session: Mobility/safety with mobility;Balance;Proper use of DME OT goals addressed during session: ADL's and self-care;Proper use of Adaptive equipment and DME      AM-PAC PT "6 Clicks" Mobility   Outcome Measure  Help needed turning from your back to your side while in a flat bed without using bedrails?: A Little Help needed moving from lying on your back to sitting on the side of a flat bed without using bedrails?: A Little Help needed moving to and from a bed to a chair (including a wheelchair)?: A Lot Help needed standing up from a chair using your arms (e.g., wheelchair or bedside chair)?: A Lot Help needed to walk in hospital room?: Total Help needed climbing 3-5 steps with a railing? : Total 6 Click Score: 12    End of Session Equipment Utilized During Treatment: Gait belt Activity Tolerance: Patient tolerated treatment well Patient left: in bed;with call bell/phone within reach;with bed alarm set Nurse Communication: Mobility status;Precautions PT Visit Diagnosis: Unsteadiness on feet (R26.81);Difficulty in walking, not elsewhere classified (R26.2);Dizziness and giddiness  (R42);Hemiplegia and hemiparesis Hemiplegia - Right/Left: Left Hemiplegia - dominant/non-dominant: Dominant Hemiplegia - caused by: Cerebral infarction     Time: 1127-1214 PT Time Calculation (min) (ACUTE ONLY): 47 min  Charges:  $Therapeutic Activity: 23-37 mins                    Leitha Bleak, PT 07/07/18, 12:46 PM [phone]

## 2018-07-07 NOTE — Plan of Care (Signed)
  Problem: Education: Goal: Knowledge of disease or condition will improve Outcome: Progressing Goal: Knowledge of secondary prevention will improve Outcome: Progressing Goal: Knowledge of patient specific risk factors addressed and post discharge goals established will improve Outcome: Progressing   Problem: Coping: Goal: Will verbalize positive feelings about self Outcome: Progressing   Problem: Self-Care: Goal: Ability to participate in self-care as condition permits will improve Outcome: Progressing Goal: Ability to communicate needs accurately will improve Outcome: Progressing   Problem: Nutrition: Goal: Risk of aspiration will decrease Outcome: Progressing   Problem: Ischemic Stroke/TIA Tissue Perfusion: Goal: Complications of ischemic stroke/TIA will be minimized Outcome: Progressing   Problem: Pain Managment: Goal: General experience of comfort will improve Outcome: Progressing   Problem: Safety: Goal: Ability to remain free from injury will improve Outcome: Progressing   Problem: Skin Integrity: Goal: Risk for impaired skin integrity will decrease Outcome: Progressing

## 2018-07-07 NOTE — Discharge Instructions (Signed)
Follow-up with primary care physician at the facility in 3 days Follow-up with neurology in 3 weeks Follow-up with cardiology in CHF clinic as scheduled Follow-up with nephrology in a month

## 2018-07-07 NOTE — Progress Notes (Signed)
Patient discharged home per MD order. Prescriptions given to patient and reviewed. All discharge instructions given and all questions answered. Will call EMS for transport.

## 2018-07-07 NOTE — TOC Transition Note (Addendum)
Transition of Care [facility]) - CM/SW Discharge Note   Patient Details  Name: Nathan [name]. MRN: [id number] Date of Birth: [birth year]  Transition of [facility]) CM/SW Contact:  Su Hilt, RN Phone Number: 07/07/2018, 10:55 AM   Clinical Narrative:    Patient to DC home today, he stated that his fiance would pick him up, ordered a wheelchair and 3 in 1 for the patient to have at home, set up [facility] services with [facility].  The patient was strongly encouraged to go to SNF and he refused.    Final next level of care: [place] Barriers to Discharge: Barriers Resolved   Patient Goals and CMS Choice Patient states their goals for this hospitalization and ongoing recovery are:: " i want to get some strength back so I can go home"  CMS Medicare.gov Compare Post Acute Care list provided to:: Patient    Discharge Placement                       Discharge Plan and Services                          Social Determinants of Health (SDOH) Interventions     Readmission Risk Interventions No flowsheet data found.

## 2018-07-07 NOTE — Telephone Encounter (Signed)
New Message:  Please call patient daughter for a post hospital follow up with Dr. [name] please call his daughter.

## 2018-07-07 NOTE — Progress Notes (Signed)
Occupational Therapy Treatment Patient Details Name: Nathan [name]. MRN: [id number] DOB: [birth year] Today's Date: 07/07/2018    History of present illness Nathan [name]  is a 68 y.o. male presents to [facility] 4/14 c L-sided weakness and facial droop x2 days. MRI + for "right paramedian pons late acute/early subacute infarction measuring up to 22 mm. No associated hemorrhage or mass effect" as well as severe chronic microvascular ischemic changes. 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    OT comments  Pt seen for co-tx session with PT to address lateral/scoot transfers from EOB to drop arm BSC for toileting tasks. Pt required +1-2 assist for transfers (see PT note for additional detail), and required initial set up and SBA for pt to perform pericare with lateral lean to L with weightbearing through LUE on lap. Pt required Min A for completing pericare for thoroughness. Pt instructed in techniques to maximize safety. Pt verbalized understanding. Pt eager to return home. Pt continues to require significant assist for functional mobility and ADL tasks. Pt states, "I'm going to try" to perform mobility/ADL tasks at home and pt reports that fiancee can assist, as "she was in that line of work before" and that "she is strong." Continue to recommend high-intensity skilled OT services upon discharge. Continue to recommend CIR. If pt does go home, will require additional equipment to maximize safety. RNCM notified.   Patient suffers from a CVA with significant L sided impairments which impairs his ability to perform daily activities like bathing, dressing, grooming, toileting, and feeding in the home. A hemi walker will not resolve the patient's issue with performing activities of daily living. A wheelchair is required and will allow patient to safely perform daily activities.   Patient can safely propel the wheelchair  in the home or has a caregiver who can provide assistance.    Follow Up Recommendations  CIR    Equipment Recommendations  3 in 1 bedside commode;Wheelchair cushion (measurements OT);Wheelchair (measurements OT)(drop arm BSC )    Recommendations for Other Services      Precautions / Restrictions Precautions Precautions: Fall Precaution Comments: Protect LUE from sublulxation at rest Restrictions Weight Bearing Restrictions: No       Mobility Bed Mobility Overal bed mobility: Needs Assistance Bed Mobility: Supine to Sit     Supine to sit: Supervision;HOB elevated Sit to supine: Supervision;HOB elevated   General bed mobility comments: increased effort to perform on own; use of bedrails  Transfers Overall transfer level: Needs assistance Equipment used: None Transfers: Lateral/Scoot Transfers          Lateral/Scoot Transfers: (+1-2, see PT  note for additional detail) General transfer comment: min assist x2 lateral scoot to R downhill bed to [facility] (armrest removed; assist required to block L>R knee and L UE for safety; vc's and tactiles cues required for "heads" and "hips" relationship--practiced scooting edge of bed first prior to transfer to [facility] with 2 assist and vc's for technique; bed pad under pt to commode and pt used this to assist with sliding to R as well and R UE grabbing BSC to help pull himself over); min assist x1 lateral scoot uphill to bed to L (assist to block L knee and for overal stability with forward lean and 2 attempts to get over to bed; vc's for technique required)    Balance Overall balance assessment: Needs assistance Sitting-balance support: No upper extremity supported Sitting balance-Leahy Scale: Good Sitting  balance - Comments: steady static sitting reaching within BOS with R UE                                   ADL either performed or assessed with clinical judgement   ADL Overall ADL's : Needs assistance/impaired                      Lower Body Dressing: Sitting/lateral leans;Moderate assistance;Minimal assistance;Cueing for sequencing;Cueing for safety Lower Body Dressing Details (indicate cue type and reason): leaning L to R, pt able to demo ability to reach back with R and L UEs, still requiring assist to complete donning/doffing over hips Toilet Transfer: +2 for physical assistance;BSC;Squat-pivot Toilet Transfer Details (indicate cue type and reason): +1-2 for lateral scoot transfers - see PT note for additional detail Toileting- Clothing Manipulation and Hygiene: Sitting/lateral lean;Minimal assistance Toileting - Clothing Manipulation Details (indicate cue type and reason): Min A to complete pericare for thoroughness, pt able to perform using non-dom R hand with set up of wipes and leaning to L onto L elbow             Vision Baseline Vision/History: Wears glasses Wears Glasses: At all times Patient Visual Report: No change from baseline     Perception     Praxis      Cognition Arousal/Alertness: Awake/alert Behavior During Therapy: WFL for tasks assessed/performed Overall Cognitive Status: Within Functional Limits for tasks assessed                                 General Comments: Decreased insight into deficits and functional implications upon discharge        Exercises Other Exercises Other Exercises: pt instructed in safety and falls prevention, assist required for ADL tasks, drop arm BSC   Shoulder Instructions       General Comments      Pertinent Vitals/ Pain       Pain Assessment: No/denies pain  Home Living                                          Prior Functioning/Environment              Frequency  Min 3X/week        Progress Toward Goals  OT Goals(current goals can now be found in the care plan section)  Progress towards OT goals: Progressing toward goals  Acute Rehab OT Goals Patient Stated Goal: improve  independence OT Goal Formulation: With patient Time For Goal Achievement: 07/15/18 Potential to Achieve Goals: Good  Plan Discharge plan remains appropriate;Frequency remains appropriate    Co-evaluation      Reason for Co-Treatment: To address functional/ADL transfers;For patient/therapist safety PT goals addressed during session: Mobility/safety with mobility;Balance;Proper use of DME OT goals addressed during session: ADL's and self-care;Proper use of Adaptive equipment and DME      AM-PAC OT "6 Clicks" Daily Activity     Outcome Measure   Help from another person eating meals?: A Little Help from another person taking care of personal grooming?: A Little Help from another person toileting, which includes using toliet, bedpan, or urinal?: A Lot Help from another person bathing (including washing, rinsing, drying)?: A Lot Help from another person  to put on and taking off regular upper body clothing?: A Little Help from another person to put on and taking off regular lower body clothing?: A Lot 6 Click Score: 15    End of Session Equipment Utilized During Treatment: Gait belt  OT Visit Diagnosis: Other symptoms and signs involving the nervous system (R29.898);Hemiplegia and hemiparesis Hemiplegia - Right/Left: Left Hemiplegia - dominant/non-dominant: Dominant Hemiplegia - caused by: Cerebral infarction   Activity Tolerance Patient tolerated treatment well   Patient Left in bed;with call bell/phone within reach;with bed alarm set   Nurse Communication          Time: 4320-0379 OT Time Calculation (min): 47 min  Charges: OT General Charges $OT Visit: 1 Visit OT Treatments $Self Care/Home Management : 8-22 mins  Jeni Salles, MPH, MS, OTR/L ascom [phone] 07/07/18, 12:52 PM

## 2018-07-07 NOTE — TOC Progression Note (Signed)
Transition of Care (TOC) - Progression Note    Patient Details  Name: Nathan [name]. MRN: [id number] Date of Birth: Mar 22, 1950  Transition of Care [facility]) CM/SW Contact  Su Hilt, RN Phone Number: 07/07/2018, 10:45 AM  Clinical Narrative:     Spoke with the patient to review bed choices, he stated he is going home, he is refusing to go to rehab, I encouraged him to reconsider reminding him that home may npt be a safe choice,  He said he would do [place], I called and got [facility] set up for the patient, a RW and BSC set up with [employer] to be delivered to the room, He stated that his fiance would pick him up top transport home   Expected Discharge Plan: [place] Barriers to Discharge: SNF Pending bed offer  Expected Discharge Plan and Services Expected Discharge Plan: [place] arrangements for the past 2 months: Single Family Home Expected Discharge Date: 07/07/18                         Social Determinants of Health (SDOH) Interventions    Readmission Risk Interventions No flowsheet data found.

## 2018-07-07 NOTE — Discharge Summary (Addendum)
[facility] NAME: Nathan [name]    MR#:  [id number]  DATE OF BIRTH:  [birth year]  DATE OF ADMISSION:  06/30/2018 ADMITTING PHYSICIAN: Saundra Shelling, MD  DATE OF DISCHARGE:  07/07/18   PRIMARY CARE PHYSICIAN: Patient, [name]    ADMISSION DIAGNOSIS:  Acute ischemic stroke ([place]) [I63.9]  DISCHARGE DIAGNOSIS:  Active Problems:   CVA (cerebral vascular accident) ([place])  COVID -negative SECONDARY DIAGNOSIS:   Past Medical History:  Diagnosis Date  . Atrial flutter ([place])    a. s/p TEE/DCCV 04/2017; b. 06/2017 s/p RFCA; c. CHADS2VASc => 5 (CHF, HTN, age x 1, CVA)-->noncompliant with Xarelto.  . Chronic combined systolic and diastolic CHF (congestive heart failure) ([place])    a. TTE 2/19: EF 20-25%, diffuse HK, mod dilated LA, mildly reduced RVSF, PASP 42; b. TTE 3/19: EF 20-25%, diffuse HK, RVSF nl, atrial flutter with RVR; c. 08/2017 Echo: EF 35-40%, diff HK. Gr1 DD. mildly dil LA. Low nl RV fxn.   . Essential hypertension   . GI bleed    a.  Hemorrhagic shock in 11/19 secondary to erosive gastropathy and Barrett's esophagus requiring multiple units PRBCs; b. Cleared to resume OAC->pt did not.  Marland Kitchen NICM (nonischemic cardiomyopathy) ([place])    a. 04/2017: Echo EF 20-25%; b. TTE 3/19: EF 20-25%; c. 07/2017 Cath: min irregs; d. 08/2017 Echo: EF 35-40%, diff HK. Gr1 DD. mildly dil LA. Low nl RV fxn.   . Noncompliance with medications   . PAF (paroxysmal atrial fibrillation) ([place])    a. 07/2017 afib->broke w/ IV amio->converted to oral bb due to prior intol to oral amio.  . Pulmonary hypertension ([place])   . Stroke [facility])    a. 06/2018 MRI/A Brain: R paramedian pons late acute/early subacute infarct, 32mm. No assoc hemorrhage or mass effect.  Sev chronic microvascular isch changtes and mod voluem loss. No large vessel occlusion, aneurysm, or significant stenosis.    HOSPITAL COURSE:  HISTORY OF PRESENT ILLNESS: Nathan [name]  is a 68 y.o. male  with a known history of atrial flutter, chronic systolic and diastolic combined heart failure with reduced ejection fraction of 20 to 25%, hypertension, pulmonary hypertension noncompliant with meds presented to the emergency room with weakness in the left side of the body.  Patient had slurred speech yesterday afternoon.  He noticed weakness in the left upper and lower extremity.  He has a facial droop.  Was evaluated with CT head which showed no acute abnormality.  Stop smoking 1 1/2-year ago.  No complaints of fever cough chills sore throat.  No recent travel or sick contacts.  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 No associated hemorrhage or mass effectcausing left-sided hemiplegia and left facial droop: Given patient's history of atrial flutter he was started on anticoagulation. He was supposed to be on Xarelto but never started on it.  He is started on Eliquis as patient prefers Eliquis due to lower GI bleed profile Carotid Doppler without significant stenosis Echocardiogram 1. The left ventricle has mild-moderately reduced systolic function, with an ejection fraction of 40-45%. The cavity size was normal. Left ventricular diastolic Doppler parameters are consistent with impaired relaxation. Unable to exclude regional wall  motion abnormality. 2. The right ventricle has normal systolic function. The cavity was normal. There is no increase in right ventricular wall thickness. 3. Negative saline contrast bubble study 4. EF improved compared to prior study in 2019  LDL  121 He will continue statin therapy with goal LDL less than 70  2. Combined chronic systolic and diastolic heart failure ejection fraction40-45% ECHO 4/20: Patient is euvolemic Continue Lasix,Aldactone and metoprolol  3. History of atrial flutter status post ablation in the past:  He will continue Eliquis and continue metoprolol for heart rate control He will need  outpatient follow up with Cardiology. He had episode of junctional rhythm yesterday however normal sinus rhythm today. Monitor patient on telemetry  4. Essential hypertension: Continue metoprolol, Aldactone and losartan  5.  Low-grade fever-resolved Afebrile for more than 24 hours Chest x-ray negative flu test is negative.  COVID -19 test is negative respiratory panel -negative Will discharge patient if is afebrile for 24 hours  6.  Renal insufficiency avoid nephrotoxins and outpatient follow-up with nephrology  Disposition-physical therapy recommended skilled nursing facility but patient is refusing he prefers going home will arrange home health, bedside commode and rolling walker   DISCHARGE CONDITIONS:   fair  CONSULTS OBTAINED:     PROCEDURES  None   DRUG ALLERGIES:  No Known Allergies  DISCHARGE MEDICATIONS:   Allergies as of 07/07/2018   No Known Allergies     Medication List    STOP taking these medications   rivaroxaban 20 MG Tabs tablet Commonly known as:  Xarelto     TAKE these medications   apixaban 5 MG Tabs tablet Commonly known as:  ELIQUIS Take 1 tablet (5 mg total) by mouth 2 (two) times daily.   atorvastatin 80 MG tablet Commonly known as:  Lipitor Take 1 tablet (80 mg total) by mouth daily.   furosemide 40 MG tablet Commonly known as:  LASIX Take 1/2 tablet (20 mg) by mouth once daily   losartan 100 MG tablet Commonly known as:  COZAAR Take 100 mg by mouth daily.   metoprolol succinate 50 MG 24 hr tablet Commonly known as:  TOPROL-XL Take 50 mg by mouth daily.   pantoprazole 40 MG tablet Commonly known as:  PROTONIX Take 1 tablet (40 mg total) by mouth daily at 6 (six) AM.   potassium chloride SA 20 MEQ tablet Commonly known as:  K-DUR Take 20 mEq by mouth daily.   spironolactone 25 MG tablet Commonly known as:  ALDACTONE Take 25 mg by mouth daily.            Durable Medical Equipment  (From admission, onward)          Start     Ordered   07/07/18 1053  For home use only DME Walker rolling  Once    Comments:  Rolling walker with 5 inch wheels  Question:  Patient needs a walker to treat with the following condition  Answer:  Acute CVA (cerebrovascular accident) ([place])   07/07/18 1052   07/07/18 1053  For home use only DME Bedside commode  Once    Question:  Patient needs a bedside commode to treat with the following condition  Answer:  Acute CVA (cerebrovascular accident) ([place])   07/07/18 1052           DISCHARGE INSTRUCTIONS:  Follow-up with primary care physician at the facility in 3 days Follow-up with neurology in 2 weeks Follow-up with cardiology Dr. Candis Musa and the CHF clinic Follow-up with nephrology in a month DIET:  Cardiac diet  DISCHARGE CONDITION:  Stable  ACTIVITY:  Activity as tolerated PER  PT/OT  OXYGEN:  Home Oxygen: No.   Oxygen Delivery: room air  DISCHARGE LOCATION:  nursing home  If you experience worsening of your admission symptoms, develop shortness of breath, life threatening emergency, suicidal or homicidal thoughts you must seek medical attention immediately by calling 911 or calling your MD immediately  if symptoms less severe.  You Must read complete instructions/literature along with all the possible adverse reactions/side effects for all the Medicines you take and that have been prescribed to you. Take any new Medicines after you have completely understood and accpet all the possible adverse reactions/side effects.   Please note  You were cared for by a hospitalist during your hospital stay. If you have any questions about your discharge medications or the care you received while you were in the hospital after you are discharged, you can call the unit and asked to speak with the hospitalist on call if the hospitalist that took care of you is not available. Once you are discharged, your primary care physician will handle any further medical issues.  Please note that NO REFILLS for any discharge medications will be authorized once you are discharged, as it is imperative that you return to your primary care physician (or establish a relationship with a primary care physician if you do not have one) for your aftercare needs so that they can reassess your need for medications and monitor your lab values.     Today  Chief Complaint  Patient presents with  . Weakness   Patient's left upper extremity weakness is clinically improving at a slower pace No fever in the past 24 hours.  Agrees to go to skilled nursing facility today ROS:  CONSTITUTIONAL: Denies fevers, chills. Denies any fatigue, weakness.  EYES: Denies blurry vision, double vision, eye pain. EARS, NOSE, THROAT: Denies tinnitus, ear pain, hearing loss. RESPIRATORY: Denies cough, wheeze, shortness of breath.  CARDIOVASCULAR: Denies chest pain, palpitations, edema.  GASTROINTESTINAL: Denies nausea, vomiting, diarrhea, abdominal pain. Denies bright red blood per rectum. GENITOURINARY: Denies dysuria, hematuria. ENDOCRINE: Denies nocturia or thyroid problems. HEMATOLOGIC AND LYMPHATIC: Denies easy bruising or bleeding. SKIN: Denies rash or lesion. MUSCULOSKELETAL: Denies pain in neck, back, shoulder, knees, hips or arthritic symptoms.  NEUROLOGIC: Positive left-sided weakness with minimal facial droop PSYCHIATRIC: Denies anxiety or depressive symptoms.   VITAL SIGNS:  Blood pressure 116/73, pulse 66, temperature (!) 97.5 F (36.4 C), temperature source Oral, resp. rate 16, height 6\' 3"  (1.905 m), weight 101.6 kg, SpO2 100 %.  I/O:    Intake/Output Summary (Last 24 hours) at 07/07/2018 1056 Last data filed at 07/07/2018 0901 Gross per 24 hour  Intake 960 ml  Output 1475 ml  Net -515 ml    PHYSICAL EXAMINATION:  GENERAL:  68 y.o.-year-old patient lying in the bed with no acute distress.  EYES: Pupils equal, round, reactive to light and accommodation. No scleral icterus.  Extraocular muscles intact.  HEENT: Head atraumatic, normocephalic. Oropharynx and nasopharynx clear.  NECK:  Supple, no jugular venous distention. No thyroid enlargement, no tenderness.  LUNGS: Normal breath sounds bilaterally, no wheezing, rales,rhonchi or crepitation. No use of accessory muscles of respiration.  CARDIOVASCULAR: S1, S2 normal. No murmurs, rubs, or gallops.  ABDOMEN: Soft, non-tender, non-distended. Bowel sounds present.  EXTREMITIES: No pedal edema, cyanosis, or clubbing.  NEUROLOGIC: Awake, alert and oriented x3.  Left-sided facial droop with left-sided hemiplegia Sensation intact. Gait not checked.  PSYCHIATRIC: The patient is alert and oriented x 3.  SKIN: No obvious rash, lesion, or ulcer.   DATA REVIEW:   CBC Recent Labs  Lab 06/30/18 1501  WBC 5.3  HGB 12.5*  HCT 38.1*  PLT 264    Chemistries  Recent Labs  Lab 06/30/18 1501  07/07/18 0308  NA 140  --  137  K 4.4   < > 4.0  CL 106  --  109  CO2 23  --  21*  GLUCOSE 116*  --  105*  BUN 22  --  33*  CREATININE 1.41*  --  1.49*  CALCIUM 9.7  --  8.9  AST 17  --   --   ALT 13  --   --   ALKPHOS 53  --   --   BILITOT 0.4  --   --    < > = values in this interval not displayed.    Cardiac Enzymes Recent Labs  Lab 06/30/18 1501  TROPONINI <0.03    Microbiology Results  Results for orders placed or performed during the hospital encounter of 06/30/18  Respiratory Panel by PCR     Status: None   Collection Time: 07/06/18 10:00 AM  Result Value Ref Range Status   Adenovirus NOT DETECTED NOT DETECTED Final   Coronavirus 229E NOT DETECTED NOT DETECTED Final    Comment: (NOTE) The Coronavirus on the Respiratory Panel, DOES NOT test for the novel  Coronavirus (2019 nCoV)    Coronavirus HKU1 NOT DETECTED NOT DETECTED Final   Coronavirus NL63 NOT DETECTED NOT DETECTED Final   Coronavirus OC43 NOT DETECTED NOT DETECTED Final   Metapneumovirus NOT DETECTED NOT DETECTED Final   Rhinovirus /  Enterovirus NOT DETECTED NOT DETECTED Final   Influenza A NOT DETECTED NOT DETECTED Final   Influenza B NOT DETECTED NOT DETECTED Final   Parainfluenza Virus 1 NOT DETECTED NOT DETECTED Final   Parainfluenza Virus 2 NOT DETECTED NOT DETECTED Final   Parainfluenza Virus 3 NOT DETECTED NOT DETECTED Final   Parainfluenza Virus 4 NOT DETECTED NOT DETECTED Final   Respiratory Syncytial Virus NOT DETECTED NOT DETECTED Final   Bordetella pertussis NOT DETECTED NOT DETECTED Final   Chlamydophila pneumoniae NOT DETECTED NOT DETECTED Final   Mycoplasma pneumoniae NOT DETECTED NOT DETECTED Final    Comment: Performed at [facility], [place]. [address] [place], [place] [address]  SARS Coronavirus 2 [facility] order, Performed in [facility] hospital lab)     Status: None   Collection Time: 07/06/18 10:00 AM  Result Value Ref Range Status   SARS Coronavirus 2 NEGATIVE NEGATIVE Final    Comment: (NOTE) If result is NEGATIVE SARS-CoV-2 target nucleic acids are NOT DETECTED. The SARS-CoV-2 RNA is generally detectable in upper and lower  respiratory specimens during the acute phase of infection. The lowest  concentration of SARS-CoV-2 viral copies this assay can detect is 250  copies / mL. A negative result does not preclude SARS-CoV-2 infection  and should not be used as the sole basis for treatment or other  patient management decisions.  A negative result may occur with  improper specimen collection / handling, submission of specimen other  than nasopharyngeal swab, presence of viral mutation(s) within the  areas targeted by this assay, and inadequate number of viral copies  (<250 copies / mL). A negative result must be combined with clinical  observations, patient history, and epidemiological information. If result is POSITIVE SARS-CoV-2 target nucleic acids are DETECTED. The SARS-CoV-2 RNA is generally detectable in upper and lower  respiratory specimens dur ing the acute phase of infection.   Positive  results are indicative of active infection with SARS-CoV-2.  Clinical  correlation with  patient history and other diagnostic information is  necessary to determine patient infection status.  Positive results do  not rule out bacterial infection or co-infection with other viruses. If result is PRESUMPTIVE POSTIVE SARS-CoV-2 nucleic acids MAY BE PRESENT.   A presumptive positive result was obtained on the submitted specimen  and confirmed on repeat testing.  While 2019 novel coronavirus  (SARS-CoV-2) nucleic acids may be present in the submitted sample  additional confirmatory testing may be necessary for epidemiological  and / or clinical management purposes  to differentiate between  SARS-CoV-2 and other Sarbecovirus currently known to infect humans.  If clinically indicated additional testing with an alternate test  methodology [phone]) is advised. The SARS-CoV-2 RNA is generally  detectable in upper and lower respiratory sp ecimens during the acute  phase of infection. The expected result is Negative. Fact Sheet for Patients:  StrictlyIdeas.no Fact Sheet for Healthcare Providers: BankingDealers.co.za This test is not yet approved or cleared by the [place] FDA and has been authorized for detection and/or diagnosis of SARS-CoV-2 by FDA under an Emergency Use Authorization (EUA).  This EUA will remain in effect (meaning this test can be used) for the duration of the COVID-19 declaration under Section 564(b)(1) of the Act, 21 U.S.C. section 360bbb-3(b)(1), unless the authorization is terminated or revoked sooner. Performed at [facility], [address] [place], [place] [address]   CULTURE, BLOOD (ROUTINE X 2) w Reflex to ID Panel     Status: None (Preliminary result)   Collection Time: 07/06/18 12:42 PM  Result Value Ref Range Status   Specimen Description BLOOD BLOOD RIGHT WRIST  Final   Special Requests    Final    BOTTLES DRAWN AEROBIC AND ANAEROBIC Blood Culture adequate volume   Culture   Final    NO GROWTH < 24 HOURS Performed at [facility], [address] [place], [place] [address]    Report Status PENDING  Incomplete  CULTURE, BLOOD (ROUTINE X 2) w Reflex to ID Panel     Status: None (Preliminary result)   Collection Time: 07/06/18  1:45 PM  Result Value Ref Range Status   Specimen Description BLOOD BLOOD RIGHT ARM  Final   Special Requests   Final    BOTTLES DRAWN AEROBIC AND ANAEROBIC Blood Culture adequate volume   Culture   Final    NO GROWTH < 24 HOURS Performed at [facility], [address] [place], [place] [address]    Report Status PENDING  Incomplete    RADIOLOGY:  Dg Chest Port 1 View  Result Date: 07/06/2018 CLINICAL DATA:  68 year old male with cough EXAM: PORTABLE CHEST 1 VIEW COMPARISON:  Prior chest x-ray 06/30/2018 FINDINGS: The lungs are clear and negative for focal airspace consolidation, pulmonary edema or suspicious pulmonary nodule. No pleural effusion or pneumothorax. Cardiac and mediastinal contours are within normal limits. No acute fracture or lytic or blastic osseous lesions. The visualized upper abdominal bowel gas pattern is unremarkable. IMPRESSION: No active disease. Electronically Signed   By: Jacqulynn Cadet M.D.   On: 07/06/2018 10:49    EKG:   Orders placed or performed during the hospital encounter of 06/30/18  . EKG 12-Lead  . EKG 12-Lead  . EKG 12-Lead  . EKG 12-Lead  . EKG 12-Lead  . EKG 12-Lead  . EKG 12-Lead  . EKG 12-Lead  . EKG 12-Lead  . EKG 12-Lead  . EKG 12-Lead  . EKG 12-Lead      Management plans  discussed with the patient, he is in agreement.  CODE STATUS:     Code Status Orders  (From admission, onward)         Start     Ordered   06/30/18 2008  Full code  Continuous     06/30/18 2007        Code Status History    Date Active Date Inactive Code Status Order ID Comments User  Context   02/07/2018 0605 02/12/2018 1548 Full Code [id number]  Arta Silence, MD ED   07/15/2017 1955 07/19/2017 1908 Full Code [id number]  Salary, Avel Peace, MD Inpatient   06/16/2017 1915 06/17/2017 1850 Full Code [id number]  [name], MD Inpatient   06/15/2017 1702 06/16/2017 1915 Full Code [id number]  Daune Perch, NP Inpatient   06/12/2017 2142 06/15/2017 1523 Full Code [id number]  Demetrios Loll, MD Inpatient   04/29/2017 2333 05/01/2017 1920 Full Code [id number]  Lance Coon, MD Inpatient      TOTAL TIME TAKING CARE OF THIS PATIENT: 45  minutes.   Note: This dictation was prepared with Dragon dictation along with smaller phrase technology. Any transcriptional errors that result from this process are unintentional.   @[facility] @  on 07/07/2018 at 10:56 AM  Between 7am to 6pm - Pager - [phone]  After 6pm go to [email or web address] - password [email or web address] [facility]  Office  [phone]  CC: Primary care physician; Patient, [name]

## 2018-07-08 ENCOUNTER — Telehealth: Payer: Self-pay

## 2018-07-08 DIAGNOSIS — I639 Cerebral infarction, unspecified: Secondary | ICD-10-CM | POA: Diagnosis not present

## 2018-07-08 NOTE — Telephone Encounter (Signed)
Virtual Visit Pre-Appointment Phone Call  "(Name), I am calling you today to discuss your upcoming appointment. We are currently trying to limit exposure to the virus that causes COVID-19 by seeing patients at home rather than in the office."  1. "What is the BEST phone number to call the day of the visit?" - include this in appointment notes  2. "Do you have or have access to (through a family member/friend) a smartphone with video capability that we can use for your visit?" a. If yes - list this number in appt notes as "cell" (if different from BEST phone #) and list the appointment type as a VIDEO visit in appointment notes b. If no - list the appointment type as a PHONE visit in appointment notes  3. Confirm consent - "In the setting of the current Covid19 crisis, you are scheduled for a TELEPHONE visit with your provider on 07/10/2018 at 2:40PM.  Just as we do with many in-office visits, in order for you to participate in this visit, we must obtain consent.  If you'd like, I can send this to your mychart (if signed up) or email for you to review.  Otherwise, I can obtain your verbal consent now.  All virtual visits are billed to your insurance company just like a normal visit would be.  By agreeing to a virtual visit, we'd like you to understand that the technology does not allow for your provider to perform an examination, and thus may limit your provider's ability to fully assess your condition. If your provider identifies any concerns that need to be evaluated in person, we will make arrangements to do so.  Finally, though the technology is pretty good, we cannot assure that it will always work on either your or our end, and in the setting of a video visit, we may have to convert it to a phone-only visit.  In either situation, we cannot ensure that we have a secure connection.  Are you willing to proceed?" STAFF: Did the patient verbally acknowledge consent to telehealth visit? Document YES/NO  here: YES  4. Advise patient to be prepared - "Two hours prior to your appointment, go ahead and check your blood pressure, pulse, oxygen saturation, and your weight (if you have the equipment to check those) and write them all down. When your visit starts, your provider will ask you for this information. If you have an Apple Watch or Kardia device, please plan to have heart rate information ready on the day of your appointment. Please have a pen and paper handy nearby the day of the visit as well."  5. Give patient instructions for MyChart download to smartphone OR Doximity/Doxy.me as below if video visit (depending on what platform provider is using)  6. Inform patient they will receive a phone call 15 minutes prior to their appointment time (may be from unknown caller ID) so they should be prepared to answer    TELEPHONE CALL NOTE  Nathan Amini. has been deemed a candidate for a follow-up tele-health visit to limit community exposure during the Covid-19 pandemic. I spoke with the patient via phone to ensure availability of phone/video source, confirm preferred email & phone number, and discuss instructions and expectations.  I reminded Nathan Pinney. to be prepared with any vital sign and/or heart rhythm information that could potentially be obtained via home monitoring, at the time of his visit. I reminded Nathan Jou. to expect a phone call prior to his visit.  Nathan Ridge, [place] 07/08/2018 2:18 PM   INSTRUCTIONS FOR DOWNLOADING THE MYCHART APP TO SMARTPHONE  - The patient must first make sure to have activated MyChart and know their login information - If Apple, go to [employer] and type in MyChart in the search bar and download the app. If Android, ask patient to go to [employer] and type in [place] in the search bar and download the app. The app is free but as with any other app downloads, their phone may require them to verify saved payment information or Apple/Android  password.  - The patient will need to then log into the app with their MyChart username and password, and select [facility] as their healthcare provider to link the account. When it is time for your visit, go to the MyChart app, find appointments, and click Begin Video Visit. Be sure to Select Allow for your device to access the Microphone and Camera for your visit. You will then be connected, and your provider will be with you shortly.  **If they have any issues connecting, or need assistance please contact MyChart service desk [phone])**  **If using a computer, in order to ensure the best quality for their visit they will need to use either of the following Internet Browsers: [employer], or Google Chrome**  IF USING DOXIMITY or DOXY.ME - The patient will receive a link just prior to their visit by text.     FULL LENGTH CONSENT FOR TELE-HEALTH VISIT   I hereby voluntarily request, consent and authorize [facility] and its employed or contracted physicians, physician assistants, nurse practitioners or other licensed health care professionals (the Practitioner), to provide me with telemedicine health care services (the "Services") as deemed necessary by the treating Practitioner. I acknowledge and consent to receive the Services by the Practitioner via telemedicine. I understand that the telemedicine visit will involve communicating with the Practitioner through live audiovisual communication technology and the disclosure of certain medical information by electronic transmission. I acknowledge that I have been given the opportunity to request an in-person assessment or other available alternative prior to the telemedicine visit and am voluntarily participating in the telemedicine visit.  I understand that I have the right to withhold or withdraw my consent to the use of telemedicine in the course of my care at any time, without affecting my right to future care or treatment,  and that the Practitioner or I may terminate the telemedicine visit at any time. I understand that I have the right to inspect all information obtained and/or recorded in the course of the telemedicine visit and may receive copies of available information for a reasonable fee.  I understand that some of the potential risks of receiving the Services via telemedicine include:  Marland Kitchen Delay or interruption in medical evaluation due to technological equipment failure or disruption; . Information transmitted may not be sufficient (e.g. poor resolution of images) to allow for appropriate medical decision making by the Practitioner; and/or  . In rare instances, security protocols could fail, causing a breach of personal health information.  Furthermore, I acknowledge that it is my responsibility to provide information about my medical history, conditions and care that is complete and accurate to the best of my ability. I acknowledge that Practitioner's advice, recommendations, and/or decision may be based on factors not within their control, such as incomplete or inaccurate data provided by me or distortions of diagnostic images or specimens that may result from electronic transmissions. I understand that  the practice of medicine is not an exact science and that Practitioner makes no warranties or guarantees regarding treatment outcomes. I acknowledge that I will receive a copy of this consent concurrently upon execution via email to the email address I last provided but may also request a printed copy by calling the office of [facility].    I understand that my insurance will be billed for this visit.   I have read or had this consent read to me. . I understand the contents of this consent, which adequately explains the benefits and risks of the Services being provided via telemedicine.  . I have been provided ample opportunity to ask questions regarding this consent and the Services and have had my questions  answered to my satisfaction. . I give my informed consent for the services to be provided through the use of telemedicine in my medical care  By participating in this telemedicine visit I agree to the above.

## 2018-07-10 ENCOUNTER — Other Ambulatory Visit: Payer: Self-pay

## 2018-07-10 ENCOUNTER — Telehealth: Payer: Medicare HMO | Admitting: Cardiovascular Disease

## 2018-07-10 NOTE — TOC Progression Note (Signed)
Transition of Care (TOC) - Progression Note    Patient Details  Name: Nathan [name]. MRN: [id number] Date of Birth: Apr 22, 1950  Transition of Care [facility]) CM/SW Contact  Su Hilt, RN Phone Number: 07/10/2018, 9:30 AM  Clinical Narrative:     Daughter Nathan [name] called  And stated that [facility] has not came to see the patient yet, I called [facility] and Malachy Mood stated that they had a difficult time getting in touch with the patient and that they finally were able to last night at 730 PM, [facility] is scheduled to see the patient today       Expected Discharge Plan and Services                                                 Social Determinants of Health (SDOH) Interventions    Readmission Risk Interventions No flowsheet data found.

## 2018-07-11 LAB — CULTURE, BLOOD (ROUTINE X 2)
Culture: NO GROWTH
Culture: NO GROWTH
Special Requests: ADEQUATE
Special Requests: ADEQUATE

## 2018-07-12 DIAGNOSIS — E785 Hyperlipidemia, unspecified: Secondary | ICD-10-CM | POA: Diagnosis not present

## 2018-07-12 DIAGNOSIS — I4892 Unspecified atrial flutter: Secondary | ICD-10-CM | POA: Diagnosis not present

## 2018-07-12 DIAGNOSIS — I255 Ischemic cardiomyopathy: Secondary | ICD-10-CM | POA: Diagnosis not present

## 2018-07-12 DIAGNOSIS — I480 Paroxysmal atrial fibrillation: Secondary | ICD-10-CM | POA: Diagnosis not present

## 2018-07-12 DIAGNOSIS — I69392 Facial weakness following cerebral infarction: Secondary | ICD-10-CM | POA: Diagnosis not present

## 2018-07-12 DIAGNOSIS — N189 Chronic kidney disease, unspecified: Secondary | ICD-10-CM | POA: Diagnosis not present

## 2018-07-12 DIAGNOSIS — I5042 Chronic combined systolic (congestive) and diastolic (congestive) heart failure: Secondary | ICD-10-CM | POA: Diagnosis not present

## 2018-07-12 DIAGNOSIS — I69354 Hemiplegia and hemiparesis following cerebral infarction affecting left non-dominant side: Secondary | ICD-10-CM | POA: Diagnosis not present

## 2018-07-12 DIAGNOSIS — I2510 Atherosclerotic heart disease of native coronary artery without angina pectoris: Secondary | ICD-10-CM | POA: Diagnosis not present

## 2018-07-12 DIAGNOSIS — I130 Hypertensive heart and chronic kidney disease with heart failure and stage 1 through stage 4 chronic kidney disease, or unspecified chronic kidney disease: Secondary | ICD-10-CM | POA: Diagnosis not present

## 2018-07-12 DIAGNOSIS — N185 Chronic kidney disease, stage 5: Secondary | ICD-10-CM | POA: Diagnosis not present

## 2018-07-12 DIAGNOSIS — I272 Pulmonary hypertension, unspecified: Secondary | ICD-10-CM | POA: Diagnosis not present

## 2018-07-13 DIAGNOSIS — I272 Pulmonary hypertension, unspecified: Secondary | ICD-10-CM | POA: Diagnosis not present

## 2018-07-13 DIAGNOSIS — I69392 Facial weakness following cerebral infarction: Secondary | ICD-10-CM | POA: Diagnosis not present

## 2018-07-13 DIAGNOSIS — I255 Ischemic cardiomyopathy: Secondary | ICD-10-CM | POA: Diagnosis not present

## 2018-07-13 DIAGNOSIS — I2510 Atherosclerotic heart disease of native coronary artery without angina pectoris: Secondary | ICD-10-CM | POA: Diagnosis not present

## 2018-07-13 DIAGNOSIS — I69354 Hemiplegia and hemiparesis following cerebral infarction affecting left non-dominant side: Secondary | ICD-10-CM | POA: Diagnosis not present

## 2018-07-13 DIAGNOSIS — N189 Chronic kidney disease, unspecified: Secondary | ICD-10-CM | POA: Diagnosis not present

## 2018-07-13 DIAGNOSIS — I130 Hypertensive heart and chronic kidney disease with heart failure and stage 1 through stage 4 chronic kidney disease, or unspecified chronic kidney disease: Secondary | ICD-10-CM | POA: Diagnosis not present

## 2018-07-13 DIAGNOSIS — I4892 Unspecified atrial flutter: Secondary | ICD-10-CM | POA: Diagnosis not present

## 2018-07-13 DIAGNOSIS — I480 Paroxysmal atrial fibrillation: Secondary | ICD-10-CM | POA: Diagnosis not present

## 2018-07-13 DIAGNOSIS — I5042 Chronic combined systolic (congestive) and diastolic (congestive) heart failure: Secondary | ICD-10-CM | POA: Diagnosis not present

## 2018-07-14 ENCOUNTER — Telehealth: Payer: Self-pay | Admitting: Cardiovascular Disease

## 2018-07-14 ENCOUNTER — Telehealth: Payer: Self-pay

## 2018-07-14 DIAGNOSIS — I480 Paroxysmal atrial fibrillation: Secondary | ICD-10-CM | POA: Diagnosis not present

## 2018-07-14 DIAGNOSIS — I272 Pulmonary hypertension, unspecified: Secondary | ICD-10-CM | POA: Diagnosis not present

## 2018-07-14 DIAGNOSIS — I130 Hypertensive heart and chronic kidney disease with heart failure and stage 1 through stage 4 chronic kidney disease, or unspecified chronic kidney disease: Secondary | ICD-10-CM | POA: Diagnosis not present

## 2018-07-14 DIAGNOSIS — I5042 Chronic combined systolic (congestive) and diastolic (congestive) heart failure: Secondary | ICD-10-CM | POA: Diagnosis not present

## 2018-07-14 DIAGNOSIS — I2510 Atherosclerotic heart disease of native coronary artery without angina pectoris: Secondary | ICD-10-CM | POA: Diagnosis not present

## 2018-07-14 DIAGNOSIS — I69392 Facial weakness following cerebral infarction: Secondary | ICD-10-CM | POA: Diagnosis not present

## 2018-07-14 DIAGNOSIS — I4892 Unspecified atrial flutter: Secondary | ICD-10-CM | POA: Diagnosis not present

## 2018-07-14 DIAGNOSIS — I69354 Hemiplegia and hemiparesis following cerebral infarction affecting left non-dominant side: Secondary | ICD-10-CM | POA: Diagnosis not present

## 2018-07-14 DIAGNOSIS — I255 Ischemic cardiomyopathy: Secondary | ICD-10-CM | POA: Diagnosis not present

## 2018-07-14 DIAGNOSIS — N189 Chronic kidney disease, unspecified: Secondary | ICD-10-CM | POA: Diagnosis not present

## 2018-07-14 NOTE — Telephone Encounter (Signed)
° °  Please call to give : SW evaluation.

## 2018-07-14 NOTE — Telephone Encounter (Signed)
TELEPHONE CALL NOTE  Nathan [name]. has been deemed a candidate for a follow-up tele-health visit to limit community exposure during the Covid-19 pandemic. I spoke with the patient via phone to ensure availability of phone/video source, confirm preferred email & phone number, discuss instructions and expectations, and review consent.   I reminded Nathan [name]. to be prepared with any vital sign and/or heart rhythm information that could potentially be obtained via home monitoring, at the time of his visit.  Finally, I reminded Nathan [name]. to expect an e-mail containing a link for their video-based visit approximately 15 minutes before his visit, or alternatively, a phone call at the time of his visit if his visit is planned to be a phone encounter.  Did the patient verbally consent to treatment as below? YES  Nathan [name], CMA 07/14/2018 10:30 AM  CONSENT FOR TELE-HEALTH VISIT - PLEASE REVIEW  I hereby voluntarily request, consent and authorize [facility] and its employed or contracted physicians, physician assistants, nurse practitioners or other licensed health care professionals (the Practitioner), to provide me with telemedicine health care services (the "Services") as deemed necessary by the treating Practitioner. I acknowledge and consent to receive the Services by the Practitioner via telemedicine. I understand that the telemedicine visit will involve communicating with the Practitioner through telephonic communication technology and the disclosure of certain medical information by electronic transmission. I acknowledge that I have been given the opportunity to request an in-person assessment or other available alternative prior to the telemedicine visit and am voluntarily participating in the telemedicine visit.  I understand that I have the right to withhold or withdraw my consent to the use of telemedicine in the course of my care at any time, without affecting my  right to future care or treatment, and that the Practitioner or I may terminate the telemedicine visit at any time. I understand that I have the right to inspect all information obtained and/or recorded in the course of the telemedicine visit and may receive copies of available information for a reasonable fee.  I understand that some of the potential risks of receiving the Services via telemedicine include:  Marland Kitchen Delay or interruption in medical evaluation due to technological equipment failure or disruption; . Information transmitted may not be sufficient (e.g. poor resolution of images) to allow for appropriate medical decision making by the Practitioner; and/or  . In rare instances, security protocols could fail, causing a breach of personal health information.  Furthermore, I acknowledge that it is my responsibility to provide information about my medical history, conditions and care that is complete and accurate to the best of my ability. I acknowledge that Practitioner's advice, recommendations, and/or decision may be based on factors not within their control, such as incomplete or inaccurate data provided by me or lack of visual representation. I understand that the practice of medicine is not an exact science and that Practitioner makes no warranties or guarantees regarding treatment outcomes. I acknowledge that I will receive a copy of this consent concurrently upon execution via email to the email address I last provided but may also request a printed copy by calling the office of [facility].    I understand that my insurance may be billed for this visit.   I have read or had this consent read to me. . I understand the contents of this consent, which adequately explains the benefits and risks of the Services being provided via telemedicine.  Marland Kitchen  I have been provided ample opportunity to ask questions regarding this consent and the Services and have had my questions answered to my  satisfaction. . I give my informed consent for the services to be provided through the use of telemedicine in my medical care  By participating in this telemedicine visit I agree to the above.

## 2018-07-14 NOTE — Telephone Encounter (Signed)
LMTCB

## 2018-07-14 NOTE — Telephone Encounter (Signed)
   TELEPHONE CALL NOTE  This patient has been deemed a candidate for follow-up tele-health visit to limit community exposure during the Covid-19 pandemic. I spoke with the patient via phone to discuss instructions. The patient was advised to review the section on consent for treatment as well. The patient will receive a phone call 2-3 days prior to their E-Visit at which time consent will be verbally confirmed. A Virtual Office Visit appointment type has been scheduled for 07/15/2018 with Darylene Price FNP.  ALLRED, AMANDA L, CMA 07/14/2018 10:30 AM

## 2018-07-15 ENCOUNTER — Ambulatory Visit: Payer: Medicare HMO | Attending: Family | Admitting: Family

## 2018-07-15 ENCOUNTER — Telehealth (HOSPITAL_COMMUNITY): Payer: Self-pay

## 2018-07-15 ENCOUNTER — Other Ambulatory Visit: Payer: Self-pay

## 2018-07-15 ENCOUNTER — Encounter: Payer: Self-pay | Admitting: Family

## 2018-07-15 VITALS — Wt 247.0 lb

## 2018-07-15 DIAGNOSIS — I480 Paroxysmal atrial fibrillation: Secondary | ICD-10-CM

## 2018-07-15 DIAGNOSIS — I5022 Chronic systolic (congestive) heart failure: Secondary | ICD-10-CM

## 2018-07-15 NOTE — Telephone Encounter (Signed)
Called and spoke with representative at [facility].  They need order for SW evaluation. Encouraged them to reach out to patient's PCP, however, patient does not have PCP at this time.  Advised I will route to Dr Rockey Situ for the ok and let them know.

## 2018-07-15 NOTE — Patient Instructions (Signed)
Continue weighing daily and call for an overnight weight gain of > 2 pounds or a weekly weight gain of >5 pounds. 

## 2018-07-15 NOTE — Progress Notes (Signed)
Virtual Visit via Telephone Note    Evaluation Performed:  Follow-up visit  This visit type was conducted due to national recommendations for restrictions regarding the COVID-19 Pandemic (e.g. social distancing).  This format is felt to be most appropriate for this patient at this time.  All issues noted in this document were discussed and addressed.  No physical exam was performed (except for noted visual exam findings with Video Visits).  Please refer to the patient's chart (MyChart message for video visits and phone note for telephone visits) for the patient's consent to telehealth for [facility]  Date:  07/15/2018   ID:  Nathan Glazier., DOB 1950/07/31, MRN [id number]  Patient Location:  [place] [address]   Provider location:   [facility] [place] [address], [place] [address]  PCP:  Patient, [name]  Cardiologist:  Ida Rogue, MD Electrophysiologist:  Virl Axe, MD  Chief Complaint:  Minimal fatigue  History of Present Illness:    Nathan Arpino. is a 68 y.o. male who presents via audio/video conferencing for a telehealth visit today.  Patient verified DOB and address.  The patient does not have symptoms concerning for COVID-19 infection (fever, chills, cough, or new SHORTNESS OF BREATH).   He describes minimal fatigue upon moderate exertion. He says that this has been present for several years. He has an associated dry cough along with this. He denies any dizziness, pedal edema, abdominal distention, palpitations, chest pain, shortness of breath, rhinorrhea, difficulty sleeping or weight gain. Says that he's supposed to have a home health nurse come out today. Sister-in-law reviewed medications with me from multiple bags as patient says that he has some difficulty with things since his recent stroke.   Prior CV studies:   The following studies were reviewed today:  Echo report from 07/01/2018 reviewed and  showed an EF of 40-45%.  Catheterization done 07/18/17 showed mild nonobstructive disease. Right heart catheterization showed moderately to severely elevated filling pressure, severe pulmonary HTN and severely reduced cardiac output.   Past Medical History:  Diagnosis Date  . Atrial flutter ([place])    a. s/p TEE/DCCV 04/2017; b. 06/2017 s/p RFCA; c. CHADS2VASc => 5 (CHF, HTN, age x 1, CVA)-->noncompliant with Xarelto.  . Chronic combined systolic and diastolic CHF (congestive heart failure) ([place])    a. TTE 2/19: EF 20-25%, diffuse HK, mod dilated LA, mildly reduced RVSF, PASP 42; b. TTE 3/19: EF 20-25%, diffuse HK, RVSF nl, atrial flutter with RVR; c. 08/2017 Echo: EF 35-40%, diff HK. Gr1 DD. mildly dil LA. Low nl RV fxn.   . Essential hypertension   . GI bleed    a.  Hemorrhagic shock in 11/19 secondary to erosive gastropathy and Barrett's esophagus requiring multiple units PRBCs; b. Cleared to resume OAC->pt did not.  Marland Kitchen NICM (nonischemic cardiomyopathy) ([place])    a. 04/2017: Echo EF 20-25%; b. TTE 3/19: EF 20-25%; c. 07/2017 Cath: min irregs; d. 08/2017 Echo: EF 35-40%, diff HK. Gr1 DD. mildly dil LA. Low nl RV fxn.   . Noncompliance with medications   . PAF (paroxysmal atrial fibrillation) ([place])    a. 07/2017 afib->broke w/ IV amio->converted to oral bb due to prior intol to oral amio.  . Pulmonary hypertension ([place])   . Stroke [facility])    a. 06/2018 MRI/A Brain: R paramedian pons late acute/early subacute infarct, 55mm. No assoc hemorrhage or mass effect.  Sev chronic microvascular isch changtes and  mod voluem loss. No large vessel occlusion, aneurysm, or significant stenosis.   Past Surgical History:  Procedure Laterality Date  . A-FLUTTER ABLATION N/A 06/16/2017   Procedure: A-FLUTTER ABLATION;  Surgeon: [name], MD;  Location: [facility];  Service: Cardiovascular;  Laterality: N/A;  . CARDIAC CATHETERIZATION    . CARDIOVERSION N/A 05/01/2017   Procedure: CARDIOVERSION;  Surgeon:  [name], MD;  Location: [facility] ORS;  Service: Cardiovascular;  Laterality: N/A;  . CORONARY ANGIOPLASTY    . ESOPHAGOGASTRODUODENOSCOPY (EGD) WITH PROPOFOL N/A 02/09/2018   Procedure: ESOPHAGOGASTRODUODENOSCOPY (EGD) WITH PROPOFOL;  Surgeon: [name], MD;  Location: [facility] ENDOSCOPY;  Service: Endoscopy;  Laterality: N/A;  . RIGHT/LEFT HEART CATH AND CORONARY ANGIOGRAPHY N/A 07/18/2017   Procedure: RIGHT/LEFT HEART CATH AND CORONARY ANGIOGRAPHY;  Surgeon: [name], MD;  Location: [facility];  Service: Cardiovascular;  Laterality: N/A;  . TEE WITHOUT CARDIOVERSION N/A 05/01/2017   Procedure: TRANSESOPHAGEAL ECHOCARDIOGRAM (TEE);  Surgeon: [name], MD;  Location: [facility] ORS;  Service: Cardiovascular;  Laterality: N/A;  . TEE WITHOUT CARDIOVERSION N/A 06/16/2017   Procedure: TRANSESOPHAGEAL ECHOCARDIOGRAM (TEE);  Surgeon: [name], MD;  Location: [facility] ENDOSCOPY;  Service: Cardiovascular;  Laterality: N/A;     Current Meds  Medication Sig  . apixaban (ELIQUIS) 5 MG TABS tablet Take 1 tablet (5 mg total) by mouth 2 (two) times daily.  Marland Kitchen atorvastatin (LIPITOR) 80 MG tablet Take 1 tablet (80 mg total) by mouth daily.  . furosemide (LASIX) 40 MG tablet Take 1/2 tablet (20 mg) by mouth once daily (Patient taking differently: 40 mg 2 (two) times daily. Take 1/2 tablet (20 mg) by mouth once daily)  . losartan (COZAAR) 100 MG tablet Take 100 mg by mouth daily.  . metoprolol succinate (TOPROL-XL) 50 MG 24 hr tablet Take 50 mg by mouth daily.  . pantoprazole (PROTONIX) 40 MG tablet Take 1 tablet (40 mg total) by mouth daily at 6 (six) AM.  . potassium chloride SA (K-DUR,KLOR-CON) 20 MEQ tablet Take 20 mEq by mouth 2 (two) times daily.   Marland Kitchen spironolactone (ALDACTONE) 25 MG tablet Take 25 mg by mouth daily.     Allergies:   Patient has no known allergies.   Social History   Tobacco Use  . Smoking status: Former Smoker    Last attempt to quit: 04/21/2017     Years since quitting: 1.2  . Smokeless tobacco: Never Used  Substance Use Topics  . Alcohol use: Yes    Alcohol/week: 3.0 standard drinks    Types: 3 Cans of beer per week    Frequency: Never    Comment: Drink a half of a 40oz beer every day, drank heavily in the past  . Drug use: Never     Family Hx: The patient's family history includes Alzheimer's disease in his father and mother.  ROS:   Please see the history of present illness.     All other systems reviewed and are negative.   Labs/Other Tests and Data Reviewed:    Recent Labs: 07/15/2017: B Natriuretic Peptide 4,283.0 02/09/2018: Magnesium 2.0 06/30/2018: ALT 13; Hemoglobin 12.5; Platelets 264 07/07/2018: BUN 33; Creatinine, Ser 1.49; Potassium 4.0; Sodium 137   Recent Lipid Panel Lab Results  Component Value Date/Time   CHOL 198 07/01/2018 02:59 AM   TRIG 112 07/01/2018 02:59 AM   HDL 55 07/01/2018 02:59 AM   CHOLHDL 3.6 07/01/2018 02:59 AM   LDLCALC 121 (H) 07/01/2018 02:59 AM  Wt Readings from Last 3 Encounters:  07/15/18 247 lb (112 kg)  06/30/18 223 lb 14.4 oz (101.6 kg)  03/31/18 229 lb 6 oz (104 kg)     Exam:    Vital Signs:  Wt 247 lb (112 kg) Comment: self-reported  BMI 30.87 kg/m    Well nourished, well developed male in no  acute distress.   ASSESSMENT & PLAN:    1. Chronic heart failure with mildly reduced ejection fraction- - NYHA class II - euvolemic based on patient's description of symptoms - weighing daily and says that his weight has been stable. Instructed to call for an overnight weight gain of >2 pounds or a weekly weight gain of >5 pounds - not adding salt and says that he uses pepper. His brother/ sister-in-law are currently doing his grocery shopping and cooking for him since his recent stroke. Reminded them to read food labels so that he can closely follow a low sodium diet - EF >40% so would not qualify for entresto - says that he's supposed to have home health start  today - discussed paramedic program with patient and he is interested so will make a referral to her   - BNP 07/15/17 was 4283.0 - had telemedicine visit with cardiology Rockey Situ) 07/10/2018  2: Atrial fibrillation- - currently on apixaban - cardioverted February 2019 - BMP on 07/07/2018 reviewed and showed sodium 137, potassium 4.0, creatinine 1.49 and GFR 55  COVID-19 Education: The signs and symptoms of COVID-19 were discussed with the patient and how to seek care for testing (follow up with PCP or arrange E-visit).  The importance of social distancing was discussed today.  Patient Risk:   After full review of this patients clinical status, I feel that they are at least moderate risk at this time.  Time:   Today, I have spent 11 minutes with the patient with telehealth technology discussing medications, diet and weights.     Medication Adjustments/Labs and Tests Ordered: Current medicines are reviewed at length with the patient today.  Concerns regarding medicines are outlined above.   Tests Ordered: No orders of the defined types were placed in this encounter.  Medication Changes: No orders of the defined types were placed in this encounter.   Disposition: Due to recent admission, will follow-up in 2 weeks or sooner for any questions/problems before then.   Signed, Alisa Graff, FNP  07/15/2018 11:46 AM    [facility]

## 2018-07-15 NOTE — Telephone Encounter (Signed)
Contacted Ajay today to set up appt to meet with him and see if interested in Paramedicine program.  Have appt tomorrow after 2:00 at his home.   [place] [phone]

## 2018-07-16 ENCOUNTER — Encounter (HOSPITAL_COMMUNITY): Payer: Self-pay

## 2018-07-16 ENCOUNTER — Other Ambulatory Visit (HOSPITAL_COMMUNITY): Payer: Self-pay

## 2018-07-16 DIAGNOSIS — I480 Paroxysmal atrial fibrillation: Secondary | ICD-10-CM | POA: Diagnosis not present

## 2018-07-16 DIAGNOSIS — I5042 Chronic combined systolic (congestive) and diastolic (congestive) heart failure: Secondary | ICD-10-CM | POA: Diagnosis not present

## 2018-07-16 DIAGNOSIS — I2510 Atherosclerotic heart disease of native coronary artery without angina pectoris: Secondary | ICD-10-CM | POA: Diagnosis not present

## 2018-07-16 DIAGNOSIS — I4892 Unspecified atrial flutter: Secondary | ICD-10-CM | POA: Diagnosis not present

## 2018-07-16 DIAGNOSIS — I255 Ischemic cardiomyopathy: Secondary | ICD-10-CM | POA: Diagnosis not present

## 2018-07-16 DIAGNOSIS — I130 Hypertensive heart and chronic kidney disease with heart failure and stage 1 through stage 4 chronic kidney disease, or unspecified chronic kidney disease: Secondary | ICD-10-CM | POA: Diagnosis not present

## 2018-07-16 DIAGNOSIS — N189 Chronic kidney disease, unspecified: Secondary | ICD-10-CM | POA: Diagnosis not present

## 2018-07-16 DIAGNOSIS — I69354 Hemiplegia and hemiparesis following cerebral infarction affecting left non-dominant side: Secondary | ICD-10-CM | POA: Diagnosis not present

## 2018-07-16 DIAGNOSIS — I272 Pulmonary hypertension, unspecified: Secondary | ICD-10-CM | POA: Diagnosis not present

## 2018-07-16 DIAGNOSIS — I69392 Facial weakness following cerebral infarction: Secondary | ICD-10-CM | POA: Diagnosis not present

## 2018-07-16 NOTE — Telephone Encounter (Signed)
Ok to order a Education officer, museum He did not pick up for televisit this past week Daughter has not got back to [place] concerning his care

## 2018-07-16 NOTE — Progress Notes (Signed)
Today was first visit with Nathan [name] to introduce myself and the program.  He is wanting to be part of the program.  He signed the Authorization form.  He has [facility] coming out also.  His only complaint is his foot hurting from his stroke, he refused to do PT today but he states they are coming back tomorrow.  Sister at the house stated he has done nothing but sit on the couch since discharged from hospital.  Advised him he has to get up and about to get feeling better. He says he will tomorrow.  Verified his medications, he states Nathan [name] advised a to take 1/2 spironalactone, verified with Nathan [name] and she states he needs to take a whole 25 mg, explained to him and he appears to understand and then he stated it was a year ago she told him that, I advised him since this discharge he needs to take the 25 mg.  He has plus 1 edema in lower extremities.  He states he is doing ok getting around the house, he has someone that lives with him that helps.  [facility] has showed up and cut the visit short because he is getting overwhelmed so she can get her self introduced, it is her first visit.   Advised him will get back in touch with him to do another home visit to see if there is anything I can help him with.   [place] [phone]

## 2018-07-17 DIAGNOSIS — I480 Paroxysmal atrial fibrillation: Secondary | ICD-10-CM | POA: Diagnosis not present

## 2018-07-17 DIAGNOSIS — I69354 Hemiplegia and hemiparesis following cerebral infarction affecting left non-dominant side: Secondary | ICD-10-CM | POA: Diagnosis not present

## 2018-07-17 DIAGNOSIS — I2510 Atherosclerotic heart disease of native coronary artery without angina pectoris: Secondary | ICD-10-CM | POA: Diagnosis not present

## 2018-07-17 DIAGNOSIS — I4892 Unspecified atrial flutter: Secondary | ICD-10-CM | POA: Diagnosis not present

## 2018-07-17 DIAGNOSIS — I272 Pulmonary hypertension, unspecified: Secondary | ICD-10-CM | POA: Diagnosis not present

## 2018-07-17 DIAGNOSIS — I69392 Facial weakness following cerebral infarction: Secondary | ICD-10-CM | POA: Diagnosis not present

## 2018-07-17 DIAGNOSIS — N189 Chronic kidney disease, unspecified: Secondary | ICD-10-CM | POA: Diagnosis not present

## 2018-07-17 DIAGNOSIS — I255 Ischemic cardiomyopathy: Secondary | ICD-10-CM | POA: Diagnosis not present

## 2018-07-17 DIAGNOSIS — I5042 Chronic combined systolic (congestive) and diastolic (congestive) heart failure: Secondary | ICD-10-CM | POA: Diagnosis not present

## 2018-07-17 DIAGNOSIS — I130 Hypertensive heart and chronic kidney disease with heart failure and stage 1 through stage 4 chronic kidney disease, or unspecified chronic kidney disease: Secondary | ICD-10-CM | POA: Diagnosis not present

## 2018-07-17 NOTE — Telephone Encounter (Signed)
Needs someone to get into the house to make sure he is taking his anticoagulation Recent stroke because he was medication noncompliant Ideally should be up to the family but they are not particularly involved it would seem If they have someone on their team that can check and make sure he is taking his medications that would be best

## 2018-07-17 NOTE — Telephone Encounter (Signed)
I called Nathan [name] back at [facility] to clarify if she was needing Dr. Donivan Scull ok regarding nursing not seeing him for 4 weeks so his therapies could come in. She states she just wanted to make Dr. Rockey Situ aware and know he was ok with this. Nathan [name] now says it will be more like 6 weeks before nursing is back in to see him.  I advised I did not realize she was needing Dr. Donivan Scull ok on this as she made it sound more like she just wanted to make him aware. I advised Nathan [name] I had sent the message to Dr. Rockey Situ earlier today, but as he has been in clinic he has not had a chance to review the message.  She is aware we will call back next week once reviewed.  Nathan [name] voices understanding.

## 2018-07-17 NOTE — Telephone Encounter (Signed)
I spoke with Nathan [name] at [facility]. I have given her a verbal order per Dr. Rockey Situ- ok to order a Education officer, museum. Per Nathan [name], she will schedule this for next week with him.  She also needed to make Dr. Rockey Situ aware that the [facility] team had a meeting about him this morning and they decided to take nursing out of his care for ~ 4 weeks as he is getting ST/ OT/ & PT- s/p stroke. He was totaling about 5 visits per week and they didn't want to overwhelm him. Nursing will do a re-evaluation on him in ~ 4 weeks.   Will forward to Dr. Rockey Situ as an Juluis Rainier.

## 2018-07-17 NOTE — Telephone Encounter (Signed)
Nathan [name] from [facility] calling to check on status  Would like a verbal ok once this information is reviewed by Dr Rhona Raider number to contact will be [phone]

## 2018-07-20 DIAGNOSIS — I480 Paroxysmal atrial fibrillation: Secondary | ICD-10-CM | POA: Diagnosis not present

## 2018-07-20 DIAGNOSIS — I272 Pulmonary hypertension, unspecified: Secondary | ICD-10-CM | POA: Diagnosis not present

## 2018-07-20 DIAGNOSIS — I69354 Hemiplegia and hemiparesis following cerebral infarction affecting left non-dominant side: Secondary | ICD-10-CM | POA: Diagnosis not present

## 2018-07-20 DIAGNOSIS — I2510 Atherosclerotic heart disease of native coronary artery without angina pectoris: Secondary | ICD-10-CM | POA: Diagnosis not present

## 2018-07-20 DIAGNOSIS — I130 Hypertensive heart and chronic kidney disease with heart failure and stage 1 through stage 4 chronic kidney disease, or unspecified chronic kidney disease: Secondary | ICD-10-CM | POA: Diagnosis not present

## 2018-07-20 DIAGNOSIS — I5042 Chronic combined systolic (congestive) and diastolic (congestive) heart failure: Secondary | ICD-10-CM | POA: Diagnosis not present

## 2018-07-20 DIAGNOSIS — I255 Ischemic cardiomyopathy: Secondary | ICD-10-CM | POA: Diagnosis not present

## 2018-07-20 DIAGNOSIS — N189 Chronic kidney disease, unspecified: Secondary | ICD-10-CM | POA: Diagnosis not present

## 2018-07-20 DIAGNOSIS — I69392 Facial weakness following cerebral infarction: Secondary | ICD-10-CM | POA: Diagnosis not present

## 2018-07-20 DIAGNOSIS — I4892 Unspecified atrial flutter: Secondary | ICD-10-CM | POA: Diagnosis not present

## 2018-07-21 DIAGNOSIS — I5042 Chronic combined systolic (congestive) and diastolic (congestive) heart failure: Secondary | ICD-10-CM | POA: Diagnosis not present

## 2018-07-21 DIAGNOSIS — I2510 Atherosclerotic heart disease of native coronary artery without angina pectoris: Secondary | ICD-10-CM | POA: Diagnosis not present

## 2018-07-21 DIAGNOSIS — I272 Pulmonary hypertension, unspecified: Secondary | ICD-10-CM | POA: Diagnosis not present

## 2018-07-21 DIAGNOSIS — I69392 Facial weakness following cerebral infarction: Secondary | ICD-10-CM | POA: Diagnosis not present

## 2018-07-21 DIAGNOSIS — I255 Ischemic cardiomyopathy: Secondary | ICD-10-CM | POA: Diagnosis not present

## 2018-07-21 DIAGNOSIS — N189 Chronic kidney disease, unspecified: Secondary | ICD-10-CM | POA: Diagnosis not present

## 2018-07-21 DIAGNOSIS — I4892 Unspecified atrial flutter: Secondary | ICD-10-CM | POA: Diagnosis not present

## 2018-07-21 DIAGNOSIS — I130 Hypertensive heart and chronic kidney disease with heart failure and stage 1 through stage 4 chronic kidney disease, or unspecified chronic kidney disease: Secondary | ICD-10-CM | POA: Diagnosis not present

## 2018-07-21 DIAGNOSIS — I480 Paroxysmal atrial fibrillation: Secondary | ICD-10-CM | POA: Diagnosis not present

## 2018-07-21 DIAGNOSIS — I69354 Hemiplegia and hemiparesis following cerebral infarction affecting left non-dominant side: Secondary | ICD-10-CM | POA: Diagnosis not present

## 2018-07-21 NOTE — Telephone Encounter (Signed)
Forms faxed back to [facility] home health and original placed above my desk

## 2018-07-21 NOTE — Telephone Encounter (Signed)
Spoke with Stanton Kidney at [facility] and she states that when they first went out patient did not have any medications in his home. Advised that I did fax over orders and to please reach out if any further concerns or questions.

## 2018-07-21 NOTE — Telephone Encounter (Signed)
Orders received and signed for patient evaluation and nurse visits. Will fax signed orders back.

## 2018-07-22 DIAGNOSIS — N189 Chronic kidney disease, unspecified: Secondary | ICD-10-CM | POA: Diagnosis not present

## 2018-07-22 DIAGNOSIS — I272 Pulmonary hypertension, unspecified: Secondary | ICD-10-CM | POA: Diagnosis not present

## 2018-07-22 DIAGNOSIS — I130 Hypertensive heart and chronic kidney disease with heart failure and stage 1 through stage 4 chronic kidney disease, or unspecified chronic kidney disease: Secondary | ICD-10-CM | POA: Diagnosis not present

## 2018-07-22 DIAGNOSIS — I480 Paroxysmal atrial fibrillation: Secondary | ICD-10-CM | POA: Diagnosis not present

## 2018-07-22 DIAGNOSIS — I69354 Hemiplegia and hemiparesis following cerebral infarction affecting left non-dominant side: Secondary | ICD-10-CM | POA: Diagnosis not present

## 2018-07-22 DIAGNOSIS — I5042 Chronic combined systolic (congestive) and diastolic (congestive) heart failure: Secondary | ICD-10-CM | POA: Diagnosis not present

## 2018-07-22 DIAGNOSIS — I4892 Unspecified atrial flutter: Secondary | ICD-10-CM | POA: Diagnosis not present

## 2018-07-22 DIAGNOSIS — I69392 Facial weakness following cerebral infarction: Secondary | ICD-10-CM | POA: Diagnosis not present

## 2018-07-22 DIAGNOSIS — I255 Ischemic cardiomyopathy: Secondary | ICD-10-CM | POA: Diagnosis not present

## 2018-07-22 DIAGNOSIS — I2510 Atherosclerotic heart disease of native coronary artery without angina pectoris: Secondary | ICD-10-CM | POA: Diagnosis not present

## 2018-07-23 ENCOUNTER — Telehealth (HOSPITAL_COMMUNITY): Payer: Self-pay

## 2018-07-23 DIAGNOSIS — I5042 Chronic combined systolic (congestive) and diastolic (congestive) heart failure: Secondary | ICD-10-CM | POA: Diagnosis not present

## 2018-07-23 DIAGNOSIS — I255 Ischemic cardiomyopathy: Secondary | ICD-10-CM | POA: Diagnosis not present

## 2018-07-23 DIAGNOSIS — I69392 Facial weakness following cerebral infarction: Secondary | ICD-10-CM | POA: Diagnosis not present

## 2018-07-23 DIAGNOSIS — I130 Hypertensive heart and chronic kidney disease with heart failure and stage 1 through stage 4 chronic kidney disease, or unspecified chronic kidney disease: Secondary | ICD-10-CM | POA: Diagnosis not present

## 2018-07-23 DIAGNOSIS — I4892 Unspecified atrial flutter: Secondary | ICD-10-CM | POA: Diagnosis not present

## 2018-07-23 DIAGNOSIS — I69354 Hemiplegia and hemiparesis following cerebral infarction affecting left non-dominant side: Secondary | ICD-10-CM | POA: Diagnosis not present

## 2018-07-23 DIAGNOSIS — I272 Pulmonary hypertension, unspecified: Secondary | ICD-10-CM | POA: Diagnosis not present

## 2018-07-23 DIAGNOSIS — I2510 Atherosclerotic heart disease of native coronary artery without angina pectoris: Secondary | ICD-10-CM | POA: Diagnosis not present

## 2018-07-23 DIAGNOSIS — I480 Paroxysmal atrial fibrillation: Secondary | ICD-10-CM | POA: Diagnosis not present

## 2018-07-23 DIAGNOSIS — N189 Chronic kidney disease, unspecified: Secondary | ICD-10-CM | POA: Diagnosis not present

## 2018-07-23 NOTE — Telephone Encounter (Signed)
Today was a telephone visit with Nathan [name].  He states doing better.  He states physical therapy has started and he is doing well with it.  He states taking his medication.  He states watching what he eats and fluid.  Denies chest pain, shortness of breath, headaches or dizziness.  Medications verified.  Will visit at his home next week for heart failure, medication compliance and diet.   [place] [phone]

## 2018-07-24 DIAGNOSIS — I69392 Facial weakness following cerebral infarction: Secondary | ICD-10-CM | POA: Diagnosis not present

## 2018-07-24 DIAGNOSIS — I272 Pulmonary hypertension, unspecified: Secondary | ICD-10-CM | POA: Diagnosis not present

## 2018-07-24 DIAGNOSIS — I2510 Atherosclerotic heart disease of native coronary artery without angina pectoris: Secondary | ICD-10-CM | POA: Diagnosis not present

## 2018-07-24 DIAGNOSIS — N189 Chronic kidney disease, unspecified: Secondary | ICD-10-CM | POA: Diagnosis not present

## 2018-07-24 DIAGNOSIS — I69354 Hemiplegia and hemiparesis following cerebral infarction affecting left non-dominant side: Secondary | ICD-10-CM | POA: Diagnosis not present

## 2018-07-24 DIAGNOSIS — I130 Hypertensive heart and chronic kidney disease with heart failure and stage 1 through stage 4 chronic kidney disease, or unspecified chronic kidney disease: Secondary | ICD-10-CM | POA: Diagnosis not present

## 2018-07-24 DIAGNOSIS — I5042 Chronic combined systolic (congestive) and diastolic (congestive) heart failure: Secondary | ICD-10-CM | POA: Diagnosis not present

## 2018-07-24 DIAGNOSIS — I4892 Unspecified atrial flutter: Secondary | ICD-10-CM | POA: Diagnosis not present

## 2018-07-24 DIAGNOSIS — I480 Paroxysmal atrial fibrillation: Secondary | ICD-10-CM | POA: Diagnosis not present

## 2018-07-24 DIAGNOSIS — I255 Ischemic cardiomyopathy: Secondary | ICD-10-CM | POA: Diagnosis not present

## 2018-07-27 DIAGNOSIS — I480 Paroxysmal atrial fibrillation: Secondary | ICD-10-CM | POA: Diagnosis not present

## 2018-07-27 DIAGNOSIS — I272 Pulmonary hypertension, unspecified: Secondary | ICD-10-CM | POA: Diagnosis not present

## 2018-07-27 DIAGNOSIS — I130 Hypertensive heart and chronic kidney disease with heart failure and stage 1 through stage 4 chronic kidney disease, or unspecified chronic kidney disease: Secondary | ICD-10-CM | POA: Diagnosis not present

## 2018-07-27 DIAGNOSIS — I69392 Facial weakness following cerebral infarction: Secondary | ICD-10-CM | POA: Diagnosis not present

## 2018-07-27 DIAGNOSIS — I4892 Unspecified atrial flutter: Secondary | ICD-10-CM | POA: Diagnosis not present

## 2018-07-27 DIAGNOSIS — N189 Chronic kidney disease, unspecified: Secondary | ICD-10-CM | POA: Diagnosis not present

## 2018-07-27 DIAGNOSIS — I255 Ischemic cardiomyopathy: Secondary | ICD-10-CM | POA: Diagnosis not present

## 2018-07-27 DIAGNOSIS — I5042 Chronic combined systolic (congestive) and diastolic (congestive) heart failure: Secondary | ICD-10-CM | POA: Diagnosis not present

## 2018-07-27 DIAGNOSIS — I2510 Atherosclerotic heart disease of native coronary artery without angina pectoris: Secondary | ICD-10-CM | POA: Diagnosis not present

## 2018-07-27 DIAGNOSIS — I69354 Hemiplegia and hemiparesis following cerebral infarction affecting left non-dominant side: Secondary | ICD-10-CM | POA: Diagnosis not present

## 2018-07-29 DIAGNOSIS — I69354 Hemiplegia and hemiparesis following cerebral infarction affecting left non-dominant side: Secondary | ICD-10-CM | POA: Diagnosis not present

## 2018-07-29 DIAGNOSIS — I130 Hypertensive heart and chronic kidney disease with heart failure and stage 1 through stage 4 chronic kidney disease, or unspecified chronic kidney disease: Secondary | ICD-10-CM | POA: Diagnosis not present

## 2018-07-29 DIAGNOSIS — I69392 Facial weakness following cerebral infarction: Secondary | ICD-10-CM | POA: Diagnosis not present

## 2018-07-29 DIAGNOSIS — I4892 Unspecified atrial flutter: Secondary | ICD-10-CM | POA: Diagnosis not present

## 2018-07-29 DIAGNOSIS — I272 Pulmonary hypertension, unspecified: Secondary | ICD-10-CM | POA: Diagnosis not present

## 2018-07-29 DIAGNOSIS — I480 Paroxysmal atrial fibrillation: Secondary | ICD-10-CM | POA: Diagnosis not present

## 2018-07-29 DIAGNOSIS — N189 Chronic kidney disease, unspecified: Secondary | ICD-10-CM | POA: Diagnosis not present

## 2018-07-29 DIAGNOSIS — I2510 Atherosclerotic heart disease of native coronary artery without angina pectoris: Secondary | ICD-10-CM | POA: Diagnosis not present

## 2018-07-29 DIAGNOSIS — I255 Ischemic cardiomyopathy: Secondary | ICD-10-CM | POA: Diagnosis not present

## 2018-07-29 DIAGNOSIS — I5042 Chronic combined systolic (congestive) and diastolic (congestive) heart failure: Secondary | ICD-10-CM | POA: Diagnosis not present

## 2018-07-30 ENCOUNTER — Telehealth: Payer: Medicare HMO | Admitting: Family

## 2018-07-30 DIAGNOSIS — I255 Ischemic cardiomyopathy: Secondary | ICD-10-CM | POA: Diagnosis not present

## 2018-07-30 DIAGNOSIS — I130 Hypertensive heart and chronic kidney disease with heart failure and stage 1 through stage 4 chronic kidney disease, or unspecified chronic kidney disease: Secondary | ICD-10-CM | POA: Diagnosis not present

## 2018-07-30 DIAGNOSIS — I69392 Facial weakness following cerebral infarction: Secondary | ICD-10-CM | POA: Diagnosis not present

## 2018-07-30 DIAGNOSIS — I5042 Chronic combined systolic (congestive) and diastolic (congestive) heart failure: Secondary | ICD-10-CM | POA: Diagnosis not present

## 2018-07-30 DIAGNOSIS — I2510 Atherosclerotic heart disease of native coronary artery without angina pectoris: Secondary | ICD-10-CM | POA: Diagnosis not present

## 2018-07-30 DIAGNOSIS — N189 Chronic kidney disease, unspecified: Secondary | ICD-10-CM | POA: Diagnosis not present

## 2018-07-30 DIAGNOSIS — I69354 Hemiplegia and hemiparesis following cerebral infarction affecting left non-dominant side: Secondary | ICD-10-CM | POA: Diagnosis not present

## 2018-07-30 DIAGNOSIS — I480 Paroxysmal atrial fibrillation: Secondary | ICD-10-CM | POA: Diagnosis not present

## 2018-07-30 DIAGNOSIS — I4892 Unspecified atrial flutter: Secondary | ICD-10-CM | POA: Diagnosis not present

## 2018-07-30 DIAGNOSIS — I272 Pulmonary hypertension, unspecified: Secondary | ICD-10-CM | POA: Diagnosis not present

## 2018-07-31 DIAGNOSIS — I255 Ischemic cardiomyopathy: Secondary | ICD-10-CM | POA: Diagnosis not present

## 2018-07-31 DIAGNOSIS — I69354 Hemiplegia and hemiparesis following cerebral infarction affecting left non-dominant side: Secondary | ICD-10-CM | POA: Diagnosis not present

## 2018-07-31 DIAGNOSIS — I130 Hypertensive heart and chronic kidney disease with heart failure and stage 1 through stage 4 chronic kidney disease, or unspecified chronic kidney disease: Secondary | ICD-10-CM | POA: Diagnosis not present

## 2018-07-31 DIAGNOSIS — I480 Paroxysmal atrial fibrillation: Secondary | ICD-10-CM | POA: Diagnosis not present

## 2018-07-31 DIAGNOSIS — I4892 Unspecified atrial flutter: Secondary | ICD-10-CM | POA: Diagnosis not present

## 2018-07-31 DIAGNOSIS — I272 Pulmonary hypertension, unspecified: Secondary | ICD-10-CM | POA: Diagnosis not present

## 2018-07-31 DIAGNOSIS — I2510 Atherosclerotic heart disease of native coronary artery without angina pectoris: Secondary | ICD-10-CM | POA: Diagnosis not present

## 2018-07-31 DIAGNOSIS — I69392 Facial weakness following cerebral infarction: Secondary | ICD-10-CM | POA: Diagnosis not present

## 2018-07-31 DIAGNOSIS — I5042 Chronic combined systolic (congestive) and diastolic (congestive) heart failure: Secondary | ICD-10-CM | POA: Diagnosis not present

## 2018-07-31 DIAGNOSIS — N189 Chronic kidney disease, unspecified: Secondary | ICD-10-CM | POA: Diagnosis not present

## 2018-08-03 ENCOUNTER — Other Ambulatory Visit: Payer: Self-pay | Admitting: Cardiovascular Disease

## 2018-08-03 DIAGNOSIS — I69392 Facial weakness following cerebral infarction: Secondary | ICD-10-CM | POA: Diagnosis not present

## 2018-08-03 DIAGNOSIS — I272 Pulmonary hypertension, unspecified: Secondary | ICD-10-CM | POA: Diagnosis not present

## 2018-08-03 DIAGNOSIS — I69354 Hemiplegia and hemiparesis following cerebral infarction affecting left non-dominant side: Secondary | ICD-10-CM | POA: Diagnosis not present

## 2018-08-03 DIAGNOSIS — I130 Hypertensive heart and chronic kidney disease with heart failure and stage 1 through stage 4 chronic kidney disease, or unspecified chronic kidney disease: Secondary | ICD-10-CM | POA: Diagnosis not present

## 2018-08-03 DIAGNOSIS — I480 Paroxysmal atrial fibrillation: Secondary | ICD-10-CM | POA: Diagnosis not present

## 2018-08-03 DIAGNOSIS — N189 Chronic kidney disease, unspecified: Secondary | ICD-10-CM | POA: Diagnosis not present

## 2018-08-03 DIAGNOSIS — I2510 Atherosclerotic heart disease of native coronary artery without angina pectoris: Secondary | ICD-10-CM | POA: Diagnosis not present

## 2018-08-03 DIAGNOSIS — I4892 Unspecified atrial flutter: Secondary | ICD-10-CM | POA: Diagnosis not present

## 2018-08-03 DIAGNOSIS — I5042 Chronic combined systolic (congestive) and diastolic (congestive) heart failure: Secondary | ICD-10-CM | POA: Diagnosis not present

## 2018-08-03 DIAGNOSIS — I255 Ischemic cardiomyopathy: Secondary | ICD-10-CM | POA: Diagnosis not present

## 2018-08-03 NOTE — Telephone Encounter (Signed)
Please advise if ok to refill Metoprolol succinate 50 mg tablet qd.

## 2018-08-04 DIAGNOSIS — I480 Paroxysmal atrial fibrillation: Secondary | ICD-10-CM | POA: Diagnosis not present

## 2018-08-04 DIAGNOSIS — I130 Hypertensive heart and chronic kidney disease with heart failure and stage 1 through stage 4 chronic kidney disease, or unspecified chronic kidney disease: Secondary | ICD-10-CM | POA: Diagnosis not present

## 2018-08-04 DIAGNOSIS — I4892 Unspecified atrial flutter: Secondary | ICD-10-CM | POA: Diagnosis not present

## 2018-08-04 DIAGNOSIS — I255 Ischemic cardiomyopathy: Secondary | ICD-10-CM | POA: Diagnosis not present

## 2018-08-04 DIAGNOSIS — N189 Chronic kidney disease, unspecified: Secondary | ICD-10-CM | POA: Diagnosis not present

## 2018-08-04 DIAGNOSIS — I5042 Chronic combined systolic (congestive) and diastolic (congestive) heart failure: Secondary | ICD-10-CM | POA: Diagnosis not present

## 2018-08-04 DIAGNOSIS — I2510 Atherosclerotic heart disease of native coronary artery without angina pectoris: Secondary | ICD-10-CM | POA: Diagnosis not present

## 2018-08-04 DIAGNOSIS — I272 Pulmonary hypertension, unspecified: Secondary | ICD-10-CM | POA: Diagnosis not present

## 2018-08-04 DIAGNOSIS — I69392 Facial weakness following cerebral infarction: Secondary | ICD-10-CM | POA: Diagnosis not present

## 2018-08-04 DIAGNOSIS — I69354 Hemiplegia and hemiparesis following cerebral infarction affecting left non-dominant side: Secondary | ICD-10-CM | POA: Diagnosis not present

## 2018-08-05 DIAGNOSIS — I480 Paroxysmal atrial fibrillation: Secondary | ICD-10-CM | POA: Diagnosis not present

## 2018-08-05 DIAGNOSIS — I130 Hypertensive heart and chronic kidney disease with heart failure and stage 1 through stage 4 chronic kidney disease, or unspecified chronic kidney disease: Secondary | ICD-10-CM | POA: Diagnosis not present

## 2018-08-05 DIAGNOSIS — I272 Pulmonary hypertension, unspecified: Secondary | ICD-10-CM | POA: Diagnosis not present

## 2018-08-05 DIAGNOSIS — I2510 Atherosclerotic heart disease of native coronary artery without angina pectoris: Secondary | ICD-10-CM | POA: Diagnosis not present

## 2018-08-05 DIAGNOSIS — I69392 Facial weakness following cerebral infarction: Secondary | ICD-10-CM | POA: Diagnosis not present

## 2018-08-05 DIAGNOSIS — I69354 Hemiplegia and hemiparesis following cerebral infarction affecting left non-dominant side: Secondary | ICD-10-CM | POA: Diagnosis not present

## 2018-08-05 DIAGNOSIS — I255 Ischemic cardiomyopathy: Secondary | ICD-10-CM | POA: Diagnosis not present

## 2018-08-05 DIAGNOSIS — I4892 Unspecified atrial flutter: Secondary | ICD-10-CM | POA: Diagnosis not present

## 2018-08-05 DIAGNOSIS — I5042 Chronic combined systolic (congestive) and diastolic (congestive) heart failure: Secondary | ICD-10-CM | POA: Diagnosis not present

## 2018-08-05 DIAGNOSIS — N189 Chronic kidney disease, unspecified: Secondary | ICD-10-CM | POA: Diagnosis not present

## 2018-08-06 DIAGNOSIS — I69392 Facial weakness following cerebral infarction: Secondary | ICD-10-CM | POA: Diagnosis not present

## 2018-08-06 DIAGNOSIS — I69354 Hemiplegia and hemiparesis following cerebral infarction affecting left non-dominant side: Secondary | ICD-10-CM | POA: Diagnosis not present

## 2018-08-06 DIAGNOSIS — I5042 Chronic combined systolic (congestive) and diastolic (congestive) heart failure: Secondary | ICD-10-CM | POA: Diagnosis not present

## 2018-08-06 DIAGNOSIS — I272 Pulmonary hypertension, unspecified: Secondary | ICD-10-CM | POA: Diagnosis not present

## 2018-08-06 DIAGNOSIS — I480 Paroxysmal atrial fibrillation: Secondary | ICD-10-CM | POA: Diagnosis not present

## 2018-08-06 DIAGNOSIS — I255 Ischemic cardiomyopathy: Secondary | ICD-10-CM | POA: Diagnosis not present

## 2018-08-06 DIAGNOSIS — I130 Hypertensive heart and chronic kidney disease with heart failure and stage 1 through stage 4 chronic kidney disease, or unspecified chronic kidney disease: Secondary | ICD-10-CM | POA: Diagnosis not present

## 2018-08-06 DIAGNOSIS — N189 Chronic kidney disease, unspecified: Secondary | ICD-10-CM | POA: Diagnosis not present

## 2018-08-06 DIAGNOSIS — I4892 Unspecified atrial flutter: Secondary | ICD-10-CM | POA: Diagnosis not present

## 2018-08-06 DIAGNOSIS — I2510 Atherosclerotic heart disease of native coronary artery without angina pectoris: Secondary | ICD-10-CM | POA: Diagnosis not present

## 2018-08-07 DIAGNOSIS — I639 Cerebral infarction, unspecified: Secondary | ICD-10-CM | POA: Diagnosis not present

## 2018-08-10 ENCOUNTER — Other Ambulatory Visit: Payer: Self-pay | Admitting: Cardiovascular Disease

## 2018-08-11 DIAGNOSIS — I5042 Chronic combined systolic (congestive) and diastolic (congestive) heart failure: Secondary | ICD-10-CM | POA: Diagnosis not present

## 2018-08-11 DIAGNOSIS — I272 Pulmonary hypertension, unspecified: Secondary | ICD-10-CM | POA: Diagnosis not present

## 2018-08-11 DIAGNOSIS — N189 Chronic kidney disease, unspecified: Secondary | ICD-10-CM | POA: Diagnosis not present

## 2018-08-11 DIAGNOSIS — I2510 Atherosclerotic heart disease of native coronary artery without angina pectoris: Secondary | ICD-10-CM | POA: Diagnosis not present

## 2018-08-11 DIAGNOSIS — I69392 Facial weakness following cerebral infarction: Secondary | ICD-10-CM | POA: Diagnosis not present

## 2018-08-11 DIAGNOSIS — I130 Hypertensive heart and chronic kidney disease with heart failure and stage 1 through stage 4 chronic kidney disease, or unspecified chronic kidney disease: Secondary | ICD-10-CM | POA: Diagnosis not present

## 2018-08-11 DIAGNOSIS — I4892 Unspecified atrial flutter: Secondary | ICD-10-CM | POA: Diagnosis not present

## 2018-08-11 DIAGNOSIS — I255 Ischemic cardiomyopathy: Secondary | ICD-10-CM | POA: Diagnosis not present

## 2018-08-11 DIAGNOSIS — I69354 Hemiplegia and hemiparesis following cerebral infarction affecting left non-dominant side: Secondary | ICD-10-CM | POA: Diagnosis not present

## 2018-08-11 DIAGNOSIS — I480 Paroxysmal atrial fibrillation: Secondary | ICD-10-CM | POA: Diagnosis not present

## 2018-08-13 ENCOUNTER — Telehealth (HOSPITAL_COMMUNITY): Payer: Self-pay

## 2018-08-13 NOTE — Telephone Encounter (Addendum)
Had a visit with Nathan [name] by phone today.  He states doing well, he has been doing PT at home and getting around much better.  He is aware of Darylene Price appt next week on Thursday and that it is by phone.  Verified his medications and he has them all, just refilled them.  He denies chest pain, shortness of breath, headaches or dizziness.  He states weighs daily and weight today is 234 lbs.  He states watches his sodium.  He states swelling in his legs have gone down.  He sounds good.  Appetite has been good.  He is concerned about going to doctors appts and people visiting him at this time.  Will continue to visit for heart failure and medication compliance.

## 2018-08-19 ENCOUNTER — Telehealth: Payer: Self-pay

## 2018-08-19 NOTE — Telephone Encounter (Signed)
   TELEPHONE CALL NOTE  This patient has been deemed a candidate for follow-up tele-health visit to limit community exposure during the Covid-19 pandemic. I spoke with the patient via phone to discuss instructions. The patient was advised to review the section on consent for treatment as well. The patient will receive a phone call 2-3 days prior to their E-Visit at which time consent will be verbally confirmed. A Virtual Office Visit appointment type has been scheduled for 08/20/2018 with Darylene Price FNP.  Vonda Antigua L, CMA 08/19/2018 9:23 AM

## 2018-08-19 NOTE — Telephone Encounter (Signed)
TELEPHONE CALL NOTE  Parley Pidcock. has been deemed a candidate for a follow-up tele-health visit to limit community exposure during the Covid-19 pandemic. I spoke with the patient via phone to ensure availability of phone/video source, confirm preferred email & phone number, discuss instructions and expectations, and review consent.   I reminded Benjaman Artman. to be prepared with any vital sign and/or heart rhythm information that could potentially be obtained via home monitoring, at the time of his visit.  Finally, I reminded Francisca Harbuck. to expect an e-mail containing a link for their video-based visit approximately 15 minutes before his visit, or alternatively, a phone call at the time of his visit if his visit is planned to be a phone encounter.  Did the patient verbally consent to treatment as below? YES  Gaylord Shih, CMA 08/19/2018 9:23 AM  CONSENT FOR TELE-HEALTH VISIT - PLEASE REVIEW  I hereby voluntarily request, consent and authorize [facility] and its employed or contracted physicians, physician assistants, nurse practitioners or other licensed health care professionals (the Practitioner), to provide me with telemedicine health care services (the "Services") as deemed necessary by the treating Practitioner. I acknowledge and consent to receive the Services by the Practitioner via telemedicine. I understand that the telemedicine visit will involve communicating with the Practitioner through telephonic communication technology and the disclosure of certain medical information by electronic transmission. I acknowledge that I have been given the opportunity to request an in-person assessment or other available alternative prior to the telemedicine visit and am voluntarily participating in the telemedicine visit.  I understand that I have the right to withhold or withdraw my consent to the use of telemedicine in the course of my care at any time, without affecting my  right to future care or treatment, and that the Practitioner or I may terminate the telemedicine visit at any time. I understand that I have the right to inspect all information obtained and/or recorded in the course of the telemedicine visit and may receive copies of available information for a reasonable fee.  I understand that some of the potential risks of receiving the Services via telemedicine include:  Marland Kitchen Delay or interruption in medical evaluation due to technological equipment failure or disruption; . Information transmitted may not be sufficient (e.g. poor resolution of images) to allow for appropriate medical decision making by the Practitioner; and/or  . In rare instances, security protocols could fail, causing a breach of personal health information.  Furthermore, I acknowledge that it is my responsibility to provide information about my medical history, conditions and care that is complete and accurate to the best of my ability. I acknowledge that Practitioner's advice, recommendations, and/or decision may be based on factors not within their control, such as incomplete or inaccurate data provided by me or lack of visual representation. I understand that the practice of medicine is not an exact science and that Practitioner makes no warranties or guarantees regarding treatment outcomes. I acknowledge that I will receive a copy of this consent concurrently upon execution via email to the email address I last provided but may also request a printed copy by calling the office of [facility].    I understand that my insurance may be billed for this visit.   I have read or had this consent read to me. . I understand the contents of this consent, which adequately explains the benefits and risks of the Services being provided via telemedicine.  Marland Kitchen  I have been provided ample opportunity to ask questions regarding this consent and the Services and have had my questions answered to my  satisfaction. . I give my informed consent for the services to be provided through the use of telemedicine in my medical care  By participating in this telemedicine visit I agree to the above.

## 2018-08-20 ENCOUNTER — Ambulatory Visit: Payer: Medicare HMO | Attending: Family | Admitting: Family

## 2018-08-20 ENCOUNTER — Other Ambulatory Visit: Payer: Self-pay

## 2018-08-20 ENCOUNTER — Encounter: Payer: Self-pay | Admitting: Family

## 2018-08-20 VITALS — BP 126/80 | Wt 228.6 lb

## 2018-08-20 DIAGNOSIS — I5022 Chronic systolic (congestive) heart failure: Secondary | ICD-10-CM

## 2018-08-20 DIAGNOSIS — I480 Paroxysmal atrial fibrillation: Secondary | ICD-10-CM

## 2018-08-20 NOTE — Progress Notes (Signed)
Virtual Visit via Telephone Note    Evaluation Performed:  Follow-up visit  This visit type was conducted due to national recommendations for restrictions regarding the COVID-19 Pandemic (e.g. social distancing).  This format is felt to be most appropriate for this patient at this time.  All issues noted in this document were discussed and addressed.  No physical exam was performed (except for noted visual exam findings with Video Visits).  Please refer to the patient's chart (MyChart message for video visits and phone note for telephone visits) for the patient's consent to telehealth for [facility]  Date:  08/20/2018   ID:  Nathan [name]., DOB 1950/07/21, MRN [id number]  Patient Location:  [place] [address]   Provider location:   [facility] [address], [place] [address]  PCP:  Patient, [name]  Cardiologist:  Ida Rogue, MD Electrophysiologist:  Virl Axe, MD  Chief Complaint:  None   History of Present Illness:    Nathan [name]. is a 68 y.o. male who presents via audio/video conferencing for a telehealth visit today.  Patient verified DOB and address.  The patient does not have symptoms concerning for COVID-19 infection (fever, chills, cough, or new SHORTNESS OF BREATH).   Patient currently reports no symptoms and specifically denies dizziness, swelling in his legs/ abdomen, palpitations, chest pain, shortness of breath, cough, difficulty sleeping, fatigue or weight gain. Currently has PT coming to the home and he's been walking more.   Prior CV studies:   The following studies were reviewed today:  Echo report from 07/01/2018 reviewed and showed an EF of 40-45%  Past Medical History:  Diagnosis Date  . Atrial flutter ([place])    a. s/p TEE/DCCV 04/2017; b. 06/2017 s/p RFCA; c. CHADS2VASc => 5 (CHF, HTN, age x 1, CVA)-->noncompliant with Xarelto.  . Chronic combined systolic and diastolic CHF  (congestive heart failure) ([place])    a. TTE 2/19: EF 20-25%, diffuse HK, mod dilated LA, mildly reduced RVSF, PASP 42; b. TTE 3/19: EF 20-25%, diffuse HK, RVSF nl, atrial flutter with RVR; c. 08/2017 Echo: EF 35-40%, diff HK. Gr1 DD. mildly dil LA. Low nl RV fxn.   . Essential hypertension   . GI bleed    a.  Hemorrhagic shock in 11/19 secondary to erosive gastropathy and Barrett's esophagus requiring multiple units PRBCs; b. Cleared to resume OAC->pt did not.  Marland Kitchen NICM (nonischemic cardiomyopathy) ([place])    a. 04/2017: Echo EF 20-25%; b. TTE 3/19: EF 20-25%; c. 07/2017 Cath: min irregs; d. 08/2017 Echo: EF 35-40%, diff HK. Gr1 DD. mildly dil LA. Low nl RV fxn.   . Noncompliance with medications   . PAF (paroxysmal atrial fibrillation) ([place])    a. 07/2017 afib->broke w/ IV amio->converted to oral bb due to prior intol to oral amio.  . Pulmonary hypertension ([place])   . Stroke [facility])    a. 06/2018 MRI/A Brain: R paramedian pons late acute/early subacute infarct, 41mm. No assoc hemorrhage or mass effect.  Sev chronic microvascular isch changtes and mod voluem loss. No large vessel occlusion, aneurysm, or significant stenosis.   Past Surgical History:  Procedure Laterality Date  . A-FLUTTER ABLATION N/A 06/16/2017   Procedure: A-FLUTTER ABLATION;  Surgeon: [name], MD;  Location: [facility] CV [facility];  Service: Cardiovascular;  Laterality: N/A;  . CARDIAC CATHETERIZATION    . CARDIOVERSION N/A 05/01/2017   Procedure: CARDIOVERSION;  Surgeon: [name],  MD;  Location: [facility] ORS;  Service: Cardiovascular;  Laterality: N/A;  . CORONARY ANGIOPLASTY    . ESOPHAGOGASTRODUODENOSCOPY (EGD) WITH PROPOFOL N/A 02/09/2018   Procedure: ESOPHAGOGASTRODUODENOSCOPY (EGD) WITH PROPOFOL;  Surgeon: [name], MD;  Location: [facility] ENDOSCOPY;  Service: Endoscopy;  Laterality: N/A;  . RIGHT/LEFT HEART CATH AND CORONARY ANGIOGRAPHY N/A 07/18/2017   Procedure: RIGHT/LEFT HEART CATH AND CORONARY ANGIOGRAPHY;   Surgeon: [name], MD;  Location: [facility];  Service: Cardiovascular;  Laterality: N/A;  . TEE WITHOUT CARDIOVERSION N/A 05/01/2017   Procedure: TRANSESOPHAGEAL ECHOCARDIOGRAM (TEE);  Surgeon: [name], MD;  Location: [facility] ORS;  Service: Cardiovascular;  Laterality: N/A;  . TEE WITHOUT CARDIOVERSION N/A 06/16/2017   Procedure: TRANSESOPHAGEAL ECHOCARDIOGRAM (TEE);  Surgeon: [name], MD;  Location: [facility] ENDOSCOPY;  Service: Cardiovascular;  Laterality: N/A;     Current Meds  Medication Sig  . apixaban (ELIQUIS) 5 MG TABS tablet Take 1 tablet (5 mg total) by mouth 2 (two) times daily.  Marland Kitchen atorvastatin (LIPITOR) 80 MG tablet Take 1 tablet (80 mg total) by mouth daily.  . furosemide (LASIX) 40 MG tablet Take 1/2 tablet (20 mg) by mouth once daily (Patient taking differently: 40 mg 2 (two) times daily. Take 1/2 tablet (20 mg) by mouth once daily)  . losartan (COZAAR) 100 MG tablet Take 1 tablet by mouth once daily  . metoprolol succinate (TOPROL-XL) 50 MG 24 hr tablet TAKE 1 TABLET BY MOUTH DAILY. TAKE WITH OR IMMEDIATELY FOLLOWING A MEAL  . pantoprazole (PROTONIX) 40 MG tablet Take 1 tablet (40 mg total) by mouth daily at 6 (six) AM.  . potassium chloride SA (K-DUR,KLOR-CON) 20 MEQ tablet Take 20 mEq by mouth 2 (two) times daily.   Marland Kitchen spironolactone (ALDACTONE) 25 MG tablet Take 1 tablet by mouth once daily     Allergies:   Patient has no known allergies.   Social History   Tobacco Use  . Smoking status: Former Smoker    Last attempt to quit: 04/21/2017    Years since quitting: 1.3  . Smokeless tobacco: Never Used  Substance Use Topics  . Alcohol use: Yes    Alcohol/week: 3.0 standard drinks    Types: 3 Cans of beer per week    Frequency: Never    Comment: Drink a half of a 40oz beer every day, drank heavily in the past  . Drug use: Never     Family Hx: The patient's family history includes Alzheimer's disease in his father and mother.  ROS:    Please see the history of present illness.     All other systems reviewed and are negative.   Labs/Other Tests and Data Reviewed:    Recent Labs: 02/09/2018: Magnesium 2.0 06/30/2018: ALT 13; Hemoglobin 12.5; Platelets 264 07/07/2018: BUN 33; Creatinine, Ser 1.49; Potassium 4.0; Sodium 137   Recent Lipid Panel Lab Results  Component Value Date/Time   CHOL 198 07/01/2018 02:59 AM   TRIG 112 07/01/2018 02:59 AM   HDL 55 07/01/2018 02:59 AM   CHOLHDL 3.6 07/01/2018 02:59 AM   LDLCALC 121 (H) 07/01/2018 02:59 AM    Wt Readings from Last 3 Encounters:  08/20/18 228 lb 9 oz (103.7 kg)  07/16/18 234 lb (106.1 kg)  07/15/18 247 lb (112 kg)     Exam:    Vital Signs:  BP 126/80 Comment: self-reported  Wt 228 lb 9 oz (103.7 kg) Comment: self-reported  BMI 28.57 kg/m    Well nourished, well developed male  in no  acute distress.   ASSESSMENT & PLAN:    1.Chronic heart failure with mildly reduced ejection fraction- - NYHA class I - euvolemic based on patient's description of symptoms - weighing daily and says that his weight has been stable. Reminded to call for an overnight weight gain of >2 pounds or a weekly weight gain of >5 pounds - not adding salt and says that he uses pepper. His brother/ sister-in-law are currently doing his grocery shopping and cooking for him. Reminded them to read food labels so that he can closely follow a low sodium diet - EF >40% so would not qualify for entresto - participating in paramedicine program  - BNP 07/15/17 was 4283.0 - had telemedicine visit with cardiology Rockey Situ) 07/10/2018  2: Atrial fibrillation- - currently on apixaban - cardioverted February 2019 - BMP on 07/07/2018 reviewed and showed sodium 137, potassium 4.0, creatinine 1.49 and GFR 55    COVID-19 Education: The signs and symptoms of COVID-19 were discussed with the patient and how to seek care for testing (follow up with PCP or arrange E-visit).  The importance of social  distancing was discussed today.  Patient Risk:   After full review of this patients clinical status, I feel that they are at least moderate risk at this time.  Time:   Today, I have spent 12 minutes with the patient with telehealth technology discussing medications, diet and symptoms to call about     Medication Adjustments/Labs and Tests Ordered: Current medicines are reviewed at length with the patient today.  Concerns regarding medicines are outlined above.   Tests Ordered: No orders of the defined types were placed in this encounter.  Medication Changes: No orders of the defined types were placed in this encounter.   Disposition:  Follow-up in 6 weeks or sooner for any questions/problems before then.   Signed, Alisa Graff, FNP  08/20/2018 11:25 AM    [facility]

## 2018-08-20 NOTE — Patient Instructions (Signed)
Continue weighing daily and call for an overnight weight gain of > 2 pounds or a weekly weight gain of >5 pounds. 

## 2018-08-25 ENCOUNTER — Other Ambulatory Visit: Payer: Self-pay

## 2018-08-25 NOTE — Telephone Encounter (Signed)
Ok to refill. #30 day w/ 6 refills  Thanks!

## 2018-08-26 MED ORDER — ATORVASTATIN CALCIUM 80 MG PO TABS: 80.0000 mg | ORAL_TABLET | Freq: Every day | ORAL | 6 refills | Status: DC

## 2018-08-26 MED ORDER — APIXABAN 5 MG PO TABS: 5.0000 mg | ORAL_TABLET | Freq: Two times a day (BID) | ORAL | 6 refills | Status: DC

## 2018-08-26 MED ORDER — PANTOPRAZOLE SODIUM 40 MG PO TBEC: 40.0000 mg | DELAYED_RELEASE_TABLET | Freq: Every day | ORAL | 6 refills | Status: DC

## 2018-09-07 ENCOUNTER — Telehealth (HOSPITAL_COMMUNITY): Payer: Self-pay

## 2018-09-07 DIAGNOSIS — I639 Cerebral infarction, unspecified: Secondary | ICD-10-CM | POA: Diagnosis not present

## 2018-09-07 NOTE — Telephone Encounter (Signed)
Nathan [name] today states doing well.  He states getting up and around a lot better.  He denies chest pain, headaches, dizziness or shortness of breath.  He states mostly stays home to try to stay healthy.  Verified his medications and he states has them all.  He still has PT coming out.  He states weighs daily, he states not gaining none but does not remember what he weighed this am.  He denies any swelling in lower extremities.  He is aware of appt with Darylene Price on 16th of this month in the office.  He states appetite is good, tries to watch high sodium foods.  Will continue to visit for heart failure.   [place] [phone]

## 2018-09-08 ENCOUNTER — Telehealth: Payer: Self-pay | Admitting: Cardiovascular Disease

## 2018-09-08 NOTE — Telephone Encounter (Signed)
Otila Kluver with [facility] home health calling Wanted to make office aware patient has been discharged from nursing services, has met all nursing goals Patient will continue occupational and physical therapy Please call Otila Kluver with any questions or concerns at [phone]

## 2018-09-10 DIAGNOSIS — I2510 Atherosclerotic heart disease of native coronary artery without angina pectoris: Secondary | ICD-10-CM | POA: Diagnosis not present

## 2018-09-10 DIAGNOSIS — I480 Paroxysmal atrial fibrillation: Secondary | ICD-10-CM | POA: Diagnosis not present

## 2018-09-10 DIAGNOSIS — I69354 Hemiplegia and hemiparesis following cerebral infarction affecting left non-dominant side: Secondary | ICD-10-CM | POA: Diagnosis not present

## 2018-09-10 DIAGNOSIS — I69392 Facial weakness following cerebral infarction: Secondary | ICD-10-CM | POA: Diagnosis not present

## 2018-09-10 DIAGNOSIS — I255 Ischemic cardiomyopathy: Secondary | ICD-10-CM | POA: Diagnosis not present

## 2018-09-10 DIAGNOSIS — I272 Pulmonary hypertension, unspecified: Secondary | ICD-10-CM | POA: Diagnosis not present

## 2018-09-10 DIAGNOSIS — E785 Hyperlipidemia, unspecified: Secondary | ICD-10-CM | POA: Diagnosis not present

## 2018-09-10 DIAGNOSIS — N185 Chronic kidney disease, stage 5: Secondary | ICD-10-CM | POA: Diagnosis not present

## 2018-09-10 DIAGNOSIS — I130 Hypertensive heart and chronic kidney disease with heart failure and stage 1 through stage 4 chronic kidney disease, or unspecified chronic kidney disease: Secondary | ICD-10-CM | POA: Diagnosis not present

## 2018-09-10 DIAGNOSIS — I5042 Chronic combined systolic (congestive) and diastolic (congestive) heart failure: Secondary | ICD-10-CM | POA: Diagnosis not present

## 2018-09-10 NOTE — Telephone Encounter (Signed)
Received order from [facility] for patient to continue with OT. Need Dr Rockey Situ to sign. Placing on Pam, RN's desk. Dr Rockey Situ to sign when returns to office.

## 2018-09-16 NOTE — Telephone Encounter (Signed)
Orders placed on TG desk to review and sign.

## 2018-09-29 NOTE — Telephone Encounter (Signed)
Called over to home health and left voicemail message requesting return call.

## 2018-09-30 NOTE — Progress Notes (Deleted)
Patient ID: Michall Noffke., male    DOB: 1950/05/28, 68 y.o.   MRN: [id number]  HPI  Mr Zietz is a 68 y/o male with a history of HTN, current tobacco use and chronic heart failure.   Echo report from 07/01/2018 reviewed and showed an EF of 40-45%. Echo report from 09/04/17 reviewed and showed an EF of 35-40%. Echo report from 06/16/17 reviewed and showed an EF of 15-20% along with mod-severe TR and an elevated PA pressure of 45 mm Hg.  Cardiac catheterization done 07/18/17 and showed heavily calcified coronary arteries with mild nonobstructive disease.  Left dominant system. Right heart catheterization showed moderately to severely elevated filling pressures, severe pulmonary hypertension and severely reduced cardiac output.   Admitted 06/30/2018 due to weakness and slurred speech. Head CT showed no acute abnormality. Brain MRI showed right paramedian pons late acute/early subacute infarction measuring up to 22 mm. Cardiology consult obtained. Anticoagulation begun. PT evaluation done. Discharged after 7 days.   He presents today with a chief complaint of a follow-up visit.  Past Medical History:  Diagnosis Date  . Atrial flutter ([place])    a. s/p TEE/DCCV 04/2017; b. 06/2017 s/p RFCA; c. CHADS2VASc => 5 (CHF, HTN, age x 1, CVA)-->noncompliant with Xarelto.  . Chronic combined systolic and diastolic CHF (congestive heart failure) ([place])    a. TTE 2/19: EF 20-25%, diffuse HK, mod dilated LA, mildly reduced RVSF, PASP 42; b. TTE 3/19: EF 20-25%, diffuse HK, RVSF nl, atrial flutter with RVR; c. 08/2017 Echo: EF 35-40%, diff HK. Gr1 DD. mildly dil LA. Low nl RV fxn.   . Essential hypertension   . GI bleed    a.  Hemorrhagic shock in 11/19 secondary to erosive gastropathy and Barrett's esophagus requiring multiple units PRBCs; b. Cleared to resume OAC->pt did not.  Marland Kitchen NICM (nonischemic cardiomyopathy) ([place])    a. 04/2017: Echo EF 20-25%; b. TTE 3/19: EF 20-25%; c. 07/2017 Cath: min irregs; d. 08/2017 Echo: EF  35-40%, diff HK. Gr1 DD. mildly dil LA. Low nl RV fxn.   . Noncompliance with medications   . PAF (paroxysmal atrial fibrillation) ([place])    a. 07/2017 afib->broke w/ IV amio->converted to oral bb due to prior intol to oral amio.  . Pulmonary hypertension ([place])   . Stroke [facility])    a. 06/2018 MRI/A Brain: R paramedian pons late acute/early subacute infarct, 51mm. No assoc hemorrhage or mass effect.  Sev chronic microvascular isch changtes and mod voluem loss. No large vessel occlusion, aneurysm, or significant stenosis.   Past Surgical History:  Procedure Laterality Date  . A-FLUTTER ABLATION N/A 06/16/2017   Procedure: A-FLUTTER ABLATION;  Surgeon: [name], MD;  Location: [place] CV LAB;  Service: Cardiovascular;  Laterality: N/A;  . CARDIAC CATHETERIZATION    . CARDIOVERSION N/A 05/01/2017   Procedure: CARDIOVERSION;  Surgeon: [name], MD;  Location: [facility] ORS;  Service: Cardiovascular;  Laterality: N/A;  . CORONARY ANGIOPLASTY    . ESOPHAGOGASTRODUODENOSCOPY (EGD) WITH PROPOFOL N/A 02/09/2018   Procedure: ESOPHAGOGASTRODUODENOSCOPY (EGD) WITH PROPOFOL;  Surgeon: [name], MD;  Location: [facility] ENDOSCOPY;  Service: Endoscopy;  Laterality: N/A;  . RIGHT/LEFT HEART CATH AND CORONARY ANGIOGRAPHY N/A 07/18/2017   Procedure: RIGHT/LEFT HEART CATH AND CORONARY ANGIOGRAPHY;  Surgeon: [name], MD;  Location: [place] [facility];  Service: Cardiovascular;  Laterality: N/A;  . TEE WITHOUT CARDIOVERSION N/A 05/01/2017   Procedure: TRANSESOPHAGEAL ECHOCARDIOGRAM (TEE);  Surgeon: [name], MD;  Location: [facility]  ORS;  Service: Cardiovascular;  Laterality: N/A;  . TEE WITHOUT CARDIOVERSION N/A 06/16/2017   Procedure: TRANSESOPHAGEAL ECHOCARDIOGRAM (TEE);  Surgeon: [name], MD;  Location: [facility] ENDOSCOPY;  Service: Cardiovascular;  Laterality: N/A;   Family History  Problem Relation Age of Onset  . Alzheimer's disease Mother   . Alzheimer's disease Father     Social History   Tobacco Use  . Smoking status: Former Smoker    Quit date: 04/21/2017    Years since quitting: 1.4  . Smokeless tobacco: Never Used  Substance Use Topics  . Alcohol use: Yes    Alcohol/week: 3.0 standard drinks    Types: 3 Cans of beer per week    Frequency: Never    Comment: Drink a half of a 40oz beer every day, drank heavily in the past   No Known Allergies    Review of Systems  Constitutional: Negative for appetite change and fatigue.  HENT: Negative for congestion, postnasal drip and sore throat.   Eyes: Negative.   Respiratory: Negative for chest tightness and shortness of breath.   Cardiovascular: Negative for chest pain, palpitations and leg swelling.  Gastrointestinal: Negative for abdominal distention and abdominal pain.  Endocrine: Negative.   Genitourinary: Negative.   Musculoskeletal: Negative for back pain and neck pain.  Skin: Negative.   Allergic/Immunologic: Negative.   Neurological: Negative for dizziness and light-headedness.  Hematological: Negative for adenopathy. Does not bruise/bleed easily.  Psychiatric/Behavioral: Negative for dysphoric mood and sleep disturbance (sleeping on 2 pillows). The patient is not nervous/anxious.      Physical Exam  Constitutional: He is oriented to person, place, and time. He appears well-developed and well-nourished.  HENT:  Head: Normocephalic and atraumatic.  Neck: Normal range of motion. Neck supple. No JVD present.  Cardiovascular: Normal rate and regular rhythm.  Pulmonary/Chest: Effort normal. No respiratory distress. He has no wheezes. He has no rales.  Abdominal: Soft. He exhibits no distension. There is no abdominal tenderness.  Musculoskeletal:        General: No tenderness or edema.  Neurological: He is alert and oriented to person, place, and time.  Skin: Skin is warm and dry.  Psychiatric: He has a normal mood and affect. His behavior is normal. Thought content normal.  Nursing note  and vitals reviewed.   Assessment & Plan:  1: Chronic heart failure with reduced ejection fraction- - NYHA class I - euvolemic today - weighing daily; Reviwed the importance of calling for any overnight weight gain of >2 pounds or a weekly weight gain of >5 pounds - weight 229.6 pounds from last visit here 6 months ago - not adding salt to his food and tries to eat low sodium foods. Reviewed the importance of closely following a 2000mg  sodium diet  - continues to work 3 days a week at [employer] but says that he usually eats a salad or tomato sandwich - had telemedicine visit with cardiology Rockey Situ) 07/10/2018  -  - BNP on 07/15/17 was 4283.0   2: HTN- - BP  - PCP - BMP from 07/07/2018 reviewed and showed sodium 137, potassium 4.0, creatinine 1.49 and GFR 55  3: Atrial flutter- - had an ablation done 06/16/17 -   Medication bottles were reviewed.

## 2018-09-30 NOTE — Telephone Encounter (Signed)
Called and spoke with Elbert Ewings over at [place] care and that I do have a couple of orders that are signed but requested that the fax we received on 09/23/2018 be sent to his primary care provider if possible. Advised if they have any problems to please let me know and I will be happy to assist. She verbalized understanding with no further questions at this time.

## 2018-10-01 ENCOUNTER — Ambulatory Visit: Payer: Medicare HMO | Admitting: Family

## 2018-10-07 DIAGNOSIS — I639 Cerebral infarction, unspecified: Secondary | ICD-10-CM | POA: Diagnosis not present

## 2018-10-10 DIAGNOSIS — I2510 Atherosclerotic heart disease of native coronary artery without angina pectoris: Secondary | ICD-10-CM | POA: Diagnosis not present

## 2018-10-10 DIAGNOSIS — I69354 Hemiplegia and hemiparesis following cerebral infarction affecting left non-dominant side: Secondary | ICD-10-CM | POA: Diagnosis not present

## 2018-10-10 DIAGNOSIS — I5042 Chronic combined systolic (congestive) and diastolic (congestive) heart failure: Secondary | ICD-10-CM | POA: Diagnosis not present

## 2018-10-10 DIAGNOSIS — N185 Chronic kidney disease, stage 5: Secondary | ICD-10-CM | POA: Diagnosis not present

## 2018-10-10 DIAGNOSIS — I255 Ischemic cardiomyopathy: Secondary | ICD-10-CM | POA: Diagnosis not present

## 2018-10-10 DIAGNOSIS — I272 Pulmonary hypertension, unspecified: Secondary | ICD-10-CM | POA: Diagnosis not present

## 2018-10-10 DIAGNOSIS — I69392 Facial weakness following cerebral infarction: Secondary | ICD-10-CM | POA: Diagnosis not present

## 2018-10-10 DIAGNOSIS — I480 Paroxysmal atrial fibrillation: Secondary | ICD-10-CM | POA: Diagnosis not present

## 2018-10-10 DIAGNOSIS — I130 Hypertensive heart and chronic kidney disease with heart failure and stage 1 through stage 4 chronic kidney disease, or unspecified chronic kidney disease: Secondary | ICD-10-CM | POA: Diagnosis not present

## 2018-10-10 DIAGNOSIS — E785 Hyperlipidemia, unspecified: Secondary | ICD-10-CM | POA: Diagnosis not present

## 2018-10-12 ENCOUNTER — Other Ambulatory Visit: Payer: Self-pay | Admitting: Cardiovascular Disease

## 2018-10-12 NOTE — Telephone Encounter (Signed)
Please review for refill. Thanks!  

## 2018-10-13 ENCOUNTER — Other Ambulatory Visit (HOSPITAL_COMMUNITY): Payer: Self-pay

## 2018-10-13 ENCOUNTER — Encounter (HOSPITAL_COMMUNITY): Payer: Self-pay

## 2018-10-13 DIAGNOSIS — I69354 Hemiplegia and hemiparesis following cerebral infarction affecting left non-dominant side: Secondary | ICD-10-CM | POA: Diagnosis not present

## 2018-10-13 DIAGNOSIS — N185 Chronic kidney disease, stage 5: Secondary | ICD-10-CM | POA: Diagnosis not present

## 2018-10-13 DIAGNOSIS — I5042 Chronic combined systolic (congestive) and diastolic (congestive) heart failure: Secondary | ICD-10-CM | POA: Diagnosis not present

## 2018-10-13 DIAGNOSIS — I2510 Atherosclerotic heart disease of native coronary artery without angina pectoris: Secondary | ICD-10-CM | POA: Diagnosis not present

## 2018-10-13 DIAGNOSIS — I69392 Facial weakness following cerebral infarction: Secondary | ICD-10-CM | POA: Diagnosis not present

## 2018-10-13 DIAGNOSIS — I480 Paroxysmal atrial fibrillation: Secondary | ICD-10-CM | POA: Diagnosis not present

## 2018-10-13 DIAGNOSIS — E785 Hyperlipidemia, unspecified: Secondary | ICD-10-CM | POA: Diagnosis not present

## 2018-10-13 DIAGNOSIS — I272 Pulmonary hypertension, unspecified: Secondary | ICD-10-CM | POA: Diagnosis not present

## 2018-10-13 DIAGNOSIS — I255 Ischemic cardiomyopathy: Secondary | ICD-10-CM | POA: Diagnosis not present

## 2018-10-13 DIAGNOSIS — I130 Hypertensive heart and chronic kidney disease with heart failure and stage 1 through stage 4 chronic kidney disease, or unspecified chronic kidney disease: Secondary | ICD-10-CM | POA: Diagnosis not present

## 2018-10-13 NOTE — Progress Notes (Signed)
Today was a phone visit with Nathan [name].  He states getting up and getting around good.  He states been feeling well.  He denies any fluid in legs or feet.  He states weighs daily, does not want to get above 240 lbs.  Denies chest pain, shortness of breath, headaches or dizziness.  He is aware of appt with HF clinic on Friday this week.  He states has all his medications and taking them.  Verified his meds.  Will continue to visit for heart failure.  He is aware to contact me if there is any problems with weight of 3 lbs or more overnight or 5 lbs in a week or medication problems.   [facility] [phone]

## 2018-10-15 DIAGNOSIS — I272 Pulmonary hypertension, unspecified: Secondary | ICD-10-CM | POA: Diagnosis not present

## 2018-10-15 DIAGNOSIS — I130 Hypertensive heart and chronic kidney disease with heart failure and stage 1 through stage 4 chronic kidney disease, or unspecified chronic kidney disease: Secondary | ICD-10-CM | POA: Diagnosis not present

## 2018-10-15 DIAGNOSIS — E785 Hyperlipidemia, unspecified: Secondary | ICD-10-CM | POA: Diagnosis not present

## 2018-10-15 DIAGNOSIS — I69392 Facial weakness following cerebral infarction: Secondary | ICD-10-CM | POA: Diagnosis not present

## 2018-10-15 DIAGNOSIS — I2510 Atherosclerotic heart disease of native coronary artery without angina pectoris: Secondary | ICD-10-CM | POA: Diagnosis not present

## 2018-10-15 DIAGNOSIS — I5042 Chronic combined systolic (congestive) and diastolic (congestive) heart failure: Secondary | ICD-10-CM | POA: Diagnosis not present

## 2018-10-15 DIAGNOSIS — N185 Chronic kidney disease, stage 5: Secondary | ICD-10-CM | POA: Diagnosis not present

## 2018-10-15 DIAGNOSIS — I69354 Hemiplegia and hemiparesis following cerebral infarction affecting left non-dominant side: Secondary | ICD-10-CM | POA: Diagnosis not present

## 2018-10-15 DIAGNOSIS — I480 Paroxysmal atrial fibrillation: Secondary | ICD-10-CM | POA: Diagnosis not present

## 2018-10-15 DIAGNOSIS — I255 Ischemic cardiomyopathy: Secondary | ICD-10-CM | POA: Diagnosis not present

## 2018-10-15 NOTE — Progress Notes (Signed)
Patient ID: Nathan Benedick., male    DOB: [birth year], 68 y.o.   MRN: [id number]  HPI  Nathan [name] is a 68 y/o male with a history of HTN, stroke, previous tobacco use and chronic heart failure.   Echo report from 07/01/2018 reviewed and showed an EF of 40-45%. Echo report from 09/04/17 reviewed and showed an EF of 35-40%. Echo report from 06/16/17 reviewed and showed an EF of 15-20% along with mod-severe TR and an elevated PA pressure of 45 mm Hg.  Cardiac catheterization done 07/18/17 and showed heavily calcified coronary arteries with mild nonobstructive disease.  Left dominant system. Right heart catheterization showed moderately to severely elevated filling pressures, severe pulmonary hypertension and severely reduced cardiac output.   Admitted 06/30/2018 due to weakness and slurred speech. Head CT showed no acute abnormality. Brain MRI showed right paramedian pons late acute/early subacute infarction measuring up to 22 mm. Cardiology consult obtained. Anticoagulation begun. PT evaluation done. Discharged after 7 days.   He presents today with a chief complaint of a follow-up visit. Does report being off balance at times and uses his cane when walking for long distances. Has physical therapy coming to the home 3-4 days/ week. Currently denies any difficulty sleeping, dizziness, abdominal distention, palpitations, pedal edema, chest pain, shortness of breath, cough, weight gain or fatigue.   Past Medical History:  Diagnosis Date  . Atrial flutter ([place])    a. s/p TEE/DCCV 04/2017; b. 06/2017 s/p RFCA; c. CHADS2VASc => 5 (CHF, HTN, age x 1, CVA)-->noncompliant with Xarelto.  . Chronic combined systolic and diastolic CHF (congestive heart failure) ([place])    a. TTE 2/19: EF 20-25%, diffuse HK, mod dilated LA, mildly reduced RVSF, PASP 42; b. TTE 3/19: EF 20-25%, diffuse HK, RVSF nl, atrial flutter with RVR; c. 08/2017 Echo: EF 35-40%, diff HK. Gr1 DD. mildly dil LA. Low nl RV fxn.   . Essential hypertension    . GI bleed    a.  Hemorrhagic shock in 11/19 secondary to erosive gastropathy and Barrett's esophagus requiring multiple units PRBCs; b. Cleared to resume OAC->pt did not.  Marland Kitchen NICM (nonischemic cardiomyopathy) ([place])    a. 04/2017: Echo EF 20-25%; b. TTE 3/19: EF 20-25%; c. 07/2017 Cath: min irregs; d. 08/2017 Echo: EF 35-40%, diff HK. Gr1 DD. mildly dil LA. Low nl RV fxn.   . Noncompliance with medications   . PAF (paroxysmal atrial fibrillation) ([place])    a. 07/2017 afib->broke w/ IV amio->converted to oral bb due to prior intol to oral amio.  . Pulmonary hypertension ([place])   . Stroke [facility])    a. 06/2018 MRI/A Brain: R paramedian pons late acute/early subacute infarct, 41mm. No assoc hemorrhage or mass effect.  Sev chronic microvascular isch changtes and mod voluem loss. No large vessel occlusion, aneurysm, or significant stenosis.   Past Surgical History:  Procedure Laterality Date  . A-FLUTTER ABLATION N/A 06/16/2017   Procedure: A-FLUTTER ABLATION;  Surgeon: [name], MD;  Location: [place] CV LAB;  Service: Cardiovascular;  Laterality: N/A;  . CARDIAC CATHETERIZATION    . CARDIOVERSION N/A 05/01/2017   Procedure: CARDIOVERSION;  Surgeon: [name], MD;  Location: [facility] ORS;  Service: Cardiovascular;  Laterality: N/A;  . CORONARY ANGIOPLASTY    . ESOPHAGOGASTRODUODENOSCOPY (EGD) WITH PROPOFOL N/A 02/09/2018   Procedure: ESOPHAGOGASTRODUODENOSCOPY (EGD) WITH PROPOFOL;  Surgeon: [name], MD;  Location: [facility] ENDOSCOPY;  Service: Endoscopy;  Laterality: N/A;  . RIGHT/LEFT HEART CATH AND CORONARY ANGIOGRAPHY N/A 07/18/2017  Procedure: RIGHT/LEFT HEART CATH AND CORONARY ANGIOGRAPHY;  Surgeon: [name], MD;  Location: [place] CV LAB;  Service: Cardiovascular;  Laterality: N/A;  . TEE WITHOUT CARDIOVERSION N/A 05/01/2017   Procedure: TRANSESOPHAGEAL ECHOCARDIOGRAM (TEE);  Surgeon: [name], MD;  Location: [facility] ORS;  Service: Cardiovascular;   Laterality: N/A;  . TEE WITHOUT CARDIOVERSION N/A 06/16/2017   Procedure: TRANSESOPHAGEAL ECHOCARDIOGRAM (TEE);  Surgeon: [name], MD;  Location: [facility] ENDOSCOPY;  Service: Cardiovascular;  Laterality: N/A;   Family History  Problem Relation Age of Onset  . Alzheimer's disease Mother   . Alzheimer's disease Father    Social History   Tobacco Use  . Smoking status: Former Smoker    Quit date: 04/21/2017    Years since quitting: 1.4  . Smokeless tobacco: Never Used  Substance Use Topics  . Alcohol use: Yes    Alcohol/week: 3.0 standard drinks    Types: 3 Cans of beer per week    Frequency: Never    Comment: Drink a half of a 40oz beer every day, drank heavily in the past   No Known Allergies  Prior to Admission medications   Medication Sig Start Date End Date Taking? Authorizing Provider  apixaban (ELIQUIS) 5 MG TABS tablet Take 1 tablet (5 mg total) by mouth 2 (two) times daily. 08/26/18  Yes Dunn, Areta Haber, PA-C  atorvastatin (LIPITOR) 80 MG tablet Take 1 tablet (80 mg total) by mouth daily. 08/26/18 08/26/19 Yes Dunn, Areta Haber, PA-C  furosemide (LASIX) 40 MG tablet Take 1/2 tablet (20 mg) by mouth once daily Patient taking differently: 40 mg 2 (two) times daily.  04/22/18  Yes Theora Gianotti, NP  losartan (COZAAR) 100 MG tablet Take 1 tablet by mouth once daily 08/11/18  Yes Gollan, Kathlene November, MD  metoprolol succinate (TOPROL-XL) 50 MG 24 hr tablet TAKE 1 TABLET BY MOUTH DAILY. TAKE WITH OR IMMEDIATELY FOLLOWING A MEAL 08/03/18  Yes Gollan, Kathlene November, MD  pantoprazole (PROTONIX) 40 MG tablet Take 1 tablet (40 mg total) by mouth daily at 6 (six) AM. 08/26/18  Yes Dunn, Areta Haber, PA-C  Potassium Chloride ER 20 MEQ TBCR Take 1 tablet by mouth twice daily 10/12/18  Yes Darylene Price A, FNP  spironolactone (ALDACTONE) 25 MG tablet Take 1 tablet by mouth once daily 08/11/18  Yes Gollan, Kathlene November, MD    Review of Systems  Constitutional: Negative for appetite change and fatigue.   HENT: Negative for congestion, postnasal drip and sore throat.   Eyes: Negative.   Respiratory: Negative for chest tightness and shortness of breath.   Cardiovascular: Negative for chest pain, palpitations and leg swelling.  Gastrointestinal: Negative for abdominal distention and abdominal pain.  Endocrine: Negative.   Genitourinary: Negative.   Musculoskeletal: Negative for back pain and neck pain.  Skin: Negative.   Allergic/Immunologic: Negative.   Neurological: Negative for dizziness and light-headedness.  Hematological: Negative for adenopathy. Does not bruise/bleed easily.  Psychiatric/Behavioral: Negative for dysphoric mood and sleep disturbance (sleeping on 2 pillows). The patient is not nervous/anxious.    Vitals:   10/16/18 1130  BP: 125/68  Pulse: (!) 57  Resp: 18  SpO2: 100%  Weight: 236 lb 6 oz (107.2 kg)  Height: 6\' 5"  (1.956 m)   Wt Readings from Last 3 Encounters:  10/16/18 236 lb 6 oz (107.2 kg)  10/13/18 235 lb (106.6 kg)  08/20/18 228 lb 9 oz (103.7 kg)   Lab Results  Component Value Date   CREATININE  1.49 (H) 07/07/2018   CREATININE 1.41 (H) 06/30/2018   CREATININE 1.25 (H) 04/28/2018    Physical Exam  Constitutional: He is oriented to person, place, and time. He appears well-developed and well-nourished.  HENT:  Head: Normocephalic and atraumatic.  Neck: Normal range of motion. Neck supple. No JVD present.  Cardiovascular: Normal rate and regular rhythm.  Pulmonary/Chest: Effort normal. No respiratory distress. He has no wheezes. He has no rales.  Abdominal: Soft. He exhibits no distension. There is no abdominal tenderness.  Musculoskeletal:        General: No tenderness or edema.  Neurological: He is alert and oriented to person, place, and time.  Skin: Skin is warm and dry.  Psychiatric: He has a normal mood and affect. His behavior is normal. Thought content normal.  Nursing note and vitals reviewed.   Assessment & Plan:  1: Chronic heart  failure with mildly reduced ejection fraction- - NYHA class I - euvolemic today - weighing daily; Reviewed the importance of calling for any overnight weight gain of >2 pounds or a weekly weight gain of >5 pounds - weight up 7 pounds from last visit here 6 months ago but he says that it's been a slow gradual weight gain - not adding salt to his food and tries to eat low sodium foods. Reviewed the importance of closely following a 2000mg  sodium diet  - is not back at work yet and is still receiving PT services at home - had telemedicine visit with cardiology Rockey Situ) 07/10/2018 - participating in paramedicine program - BNP on 07/15/17 was 4283.0 - EF >40% so would not qualify for entresto - bradycardic so will not titrate up metoprolol succinate  2: HTN- - BP looks good today  - does not have a PCP and says that he's not interested in obtaining one - BMP from 07/07/2018 reviewed and showed sodium 137, potassium 4.0, creatinine 1.49 and GFR 55   Medication bottles were reviewed.  Return in 6 months or sooner for any questions/problems before then.

## 2018-10-16 ENCOUNTER — Ambulatory Visit: Payer: Medicare HMO | Attending: Family | Admitting: Family

## 2018-10-16 ENCOUNTER — Encounter: Payer: Self-pay | Admitting: Family

## 2018-10-16 ENCOUNTER — Other Ambulatory Visit: Payer: Self-pay

## 2018-10-16 VITALS — BP 125/68 | HR 57 | Resp 18 | Ht 77.0 in | Wt 236.4 lb

## 2018-10-16 DIAGNOSIS — I5022 Chronic systolic (congestive) heart failure: Secondary | ICD-10-CM

## 2018-10-16 DIAGNOSIS — I509 Heart failure, unspecified: Secondary | ICD-10-CM | POA: Diagnosis present

## 2018-10-16 DIAGNOSIS — Z87891 Personal history of nicotine dependence: Secondary | ICD-10-CM | POA: Insufficient documentation

## 2018-10-16 DIAGNOSIS — Z7901 Long term (current) use of anticoagulants: Secondary | ICD-10-CM | POA: Diagnosis not present

## 2018-10-16 DIAGNOSIS — I272 Pulmonary hypertension, unspecified: Secondary | ICD-10-CM | POA: Insufficient documentation

## 2018-10-16 DIAGNOSIS — Z8673 Personal history of transient ischemic attack (TIA), and cerebral infarction without residual deficits: Secondary | ICD-10-CM | POA: Insufficient documentation

## 2018-10-16 DIAGNOSIS — I110 Hypertensive heart disease with heart failure: Secondary | ICD-10-CM | POA: Diagnosis not present

## 2018-10-16 DIAGNOSIS — I10 Essential (primary) hypertension: Secondary | ICD-10-CM

## 2018-10-16 DIAGNOSIS — I480 Paroxysmal atrial fibrillation: Secondary | ICD-10-CM | POA: Insufficient documentation

## 2018-10-16 DIAGNOSIS — Z79899 Other long term (current) drug therapy: Secondary | ICD-10-CM | POA: Insufficient documentation

## 2018-10-16 NOTE — Patient Instructions (Signed)
Continue weighing daily and call for an overnight weight gain of > 2 pounds or a weekly weight gain of >5 pounds. 

## 2018-10-19 DIAGNOSIS — I2510 Atherosclerotic heart disease of native coronary artery without angina pectoris: Secondary | ICD-10-CM | POA: Diagnosis not present

## 2018-10-19 DIAGNOSIS — I255 Ischemic cardiomyopathy: Secondary | ICD-10-CM | POA: Diagnosis not present

## 2018-10-19 DIAGNOSIS — E785 Hyperlipidemia, unspecified: Secondary | ICD-10-CM | POA: Diagnosis not present

## 2018-10-19 DIAGNOSIS — I480 Paroxysmal atrial fibrillation: Secondary | ICD-10-CM | POA: Diagnosis not present

## 2018-10-19 DIAGNOSIS — I272 Pulmonary hypertension, unspecified: Secondary | ICD-10-CM | POA: Diagnosis not present

## 2018-10-19 DIAGNOSIS — I69392 Facial weakness following cerebral infarction: Secondary | ICD-10-CM | POA: Diagnosis not present

## 2018-10-19 DIAGNOSIS — I69354 Hemiplegia and hemiparesis following cerebral infarction affecting left non-dominant side: Secondary | ICD-10-CM | POA: Diagnosis not present

## 2018-10-19 DIAGNOSIS — I5042 Chronic combined systolic (congestive) and diastolic (congestive) heart failure: Secondary | ICD-10-CM | POA: Diagnosis not present

## 2018-10-19 DIAGNOSIS — I130 Hypertensive heart and chronic kidney disease with heart failure and stage 1 through stage 4 chronic kidney disease, or unspecified chronic kidney disease: Secondary | ICD-10-CM | POA: Diagnosis not present

## 2018-10-19 DIAGNOSIS — N185 Chronic kidney disease, stage 5: Secondary | ICD-10-CM | POA: Diagnosis not present

## 2018-10-21 DIAGNOSIS — I5042 Chronic combined systolic (congestive) and diastolic (congestive) heart failure: Secondary | ICD-10-CM | POA: Diagnosis not present

## 2018-10-21 DIAGNOSIS — I272 Pulmonary hypertension, unspecified: Secondary | ICD-10-CM | POA: Diagnosis not present

## 2018-10-21 DIAGNOSIS — I69354 Hemiplegia and hemiparesis following cerebral infarction affecting left non-dominant side: Secondary | ICD-10-CM | POA: Diagnosis not present

## 2018-10-21 DIAGNOSIS — I69392 Facial weakness following cerebral infarction: Secondary | ICD-10-CM | POA: Diagnosis not present

## 2018-10-21 DIAGNOSIS — N185 Chronic kidney disease, stage 5: Secondary | ICD-10-CM | POA: Diagnosis not present

## 2018-10-21 DIAGNOSIS — I255 Ischemic cardiomyopathy: Secondary | ICD-10-CM | POA: Diagnosis not present

## 2018-10-21 DIAGNOSIS — E785 Hyperlipidemia, unspecified: Secondary | ICD-10-CM | POA: Diagnosis not present

## 2018-10-21 DIAGNOSIS — I2510 Atherosclerotic heart disease of native coronary artery without angina pectoris: Secondary | ICD-10-CM | POA: Diagnosis not present

## 2018-10-21 DIAGNOSIS — I130 Hypertensive heart and chronic kidney disease with heart failure and stage 1 through stage 4 chronic kidney disease, or unspecified chronic kidney disease: Secondary | ICD-10-CM | POA: Diagnosis not present

## 2018-10-21 DIAGNOSIS — I480 Paroxysmal atrial fibrillation: Secondary | ICD-10-CM | POA: Diagnosis not present

## 2018-10-26 ENCOUNTER — Telehealth (HOSPITAL_COMMUNITY): Payer: Self-pay

## 2018-10-26 DIAGNOSIS — I5042 Chronic combined systolic (congestive) and diastolic (congestive) heart failure: Secondary | ICD-10-CM | POA: Diagnosis not present

## 2018-10-26 DIAGNOSIS — I69392 Facial weakness following cerebral infarction: Secondary | ICD-10-CM | POA: Diagnosis not present

## 2018-10-26 DIAGNOSIS — I2510 Atherosclerotic heart disease of native coronary artery without angina pectoris: Secondary | ICD-10-CM | POA: Diagnosis not present

## 2018-10-26 DIAGNOSIS — I255 Ischemic cardiomyopathy: Secondary | ICD-10-CM | POA: Diagnosis not present

## 2018-10-26 DIAGNOSIS — E785 Hyperlipidemia, unspecified: Secondary | ICD-10-CM | POA: Diagnosis not present

## 2018-10-26 DIAGNOSIS — I272 Pulmonary hypertension, unspecified: Secondary | ICD-10-CM | POA: Diagnosis not present

## 2018-10-26 DIAGNOSIS — I480 Paroxysmal atrial fibrillation: Secondary | ICD-10-CM | POA: Diagnosis not present

## 2018-10-26 DIAGNOSIS — N185 Chronic kidney disease, stage 5: Secondary | ICD-10-CM | POA: Diagnosis not present

## 2018-10-26 DIAGNOSIS — I130 Hypertensive heart and chronic kidney disease with heart failure and stage 1 through stage 4 chronic kidney disease, or unspecified chronic kidney disease: Secondary | ICD-10-CM | POA: Diagnosis not present

## 2018-10-26 DIAGNOSIS — I69354 Hemiplegia and hemiparesis following cerebral infarction affecting left non-dominant side: Secondary | ICD-10-CM | POA: Diagnosis not present

## 2018-10-26 NOTE — Telephone Encounter (Signed)
Today had a telephone visit with Nathan [name].  He states doing well, he states HF clinic appt went well, he dont go back for 6 months.  He states he is able to get around now.  He denies chest pain, shortness of breath, headaches or dizziness.  He watches his diet and keeps his weight under 240 lbs, today weight is 236 lbs.  He denies swelling in extremities or abdominal area feeling tight.  Meds verified and he is aware of what to take and when.  Will continue to visit for heart failure.   [place] [phone]

## 2018-10-27 ENCOUNTER — Telehealth: Payer: Self-pay | Admitting: Cardiovascular Disease

## 2018-10-27 DIAGNOSIS — E785 Hyperlipidemia, unspecified: Secondary | ICD-10-CM | POA: Diagnosis not present

## 2018-10-27 DIAGNOSIS — I272 Pulmonary hypertension, unspecified: Secondary | ICD-10-CM | POA: Diagnosis not present

## 2018-10-27 DIAGNOSIS — I130 Hypertensive heart and chronic kidney disease with heart failure and stage 1 through stage 4 chronic kidney disease, or unspecified chronic kidney disease: Secondary | ICD-10-CM | POA: Diagnosis not present

## 2018-10-27 DIAGNOSIS — I5042 Chronic combined systolic (congestive) and diastolic (congestive) heart failure: Secondary | ICD-10-CM | POA: Diagnosis not present

## 2018-10-27 DIAGNOSIS — N185 Chronic kidney disease, stage 5: Secondary | ICD-10-CM | POA: Diagnosis not present

## 2018-10-27 DIAGNOSIS — I2510 Atherosclerotic heart disease of native coronary artery without angina pectoris: Secondary | ICD-10-CM | POA: Diagnosis not present

## 2018-10-27 DIAGNOSIS — I69392 Facial weakness following cerebral infarction: Secondary | ICD-10-CM | POA: Diagnosis not present

## 2018-10-27 DIAGNOSIS — I255 Ischemic cardiomyopathy: Secondary | ICD-10-CM | POA: Diagnosis not present

## 2018-10-27 DIAGNOSIS — I480 Paroxysmal atrial fibrillation: Secondary | ICD-10-CM | POA: Diagnosis not present

## 2018-10-27 DIAGNOSIS — I69354 Hemiplegia and hemiparesis following cerebral infarction affecting left non-dominant side: Secondary | ICD-10-CM | POA: Diagnosis not present

## 2018-10-27 NOTE — Telephone Encounter (Signed)
Returned call to [employer]. She is inquiring about PT/OT orders as he is being d/c from home health next week.   Dr. Rockey Situ is listed as primary on their paperwork. I advised Cecille Rubin that since we are not primary care he would need to refer to them for orders outside of cardiology. She verbalized understanding and would speak to Mr. Skov and his family about getting PCP lined up.   I suggested that she could call [facility] for further information about local PCP. According to records we have provided him with this information in the past.   Furniture conservator/restorer.   Advised to call for any further questions or concerns.

## 2018-10-27 NOTE — Telephone Encounter (Signed)
Home health OT calling in regarding patient being discharged from home health services next week. Patient will need a referral to outpatient (preferably [facility] outpatient therapy) therapy for OT and PT

## 2018-11-02 DIAGNOSIS — I272 Pulmonary hypertension, unspecified: Secondary | ICD-10-CM | POA: Diagnosis not present

## 2018-11-02 DIAGNOSIS — I5042 Chronic combined systolic (congestive) and diastolic (congestive) heart failure: Secondary | ICD-10-CM | POA: Diagnosis not present

## 2018-11-02 DIAGNOSIS — I255 Ischemic cardiomyopathy: Secondary | ICD-10-CM | POA: Diagnosis not present

## 2018-11-02 DIAGNOSIS — I69392 Facial weakness following cerebral infarction: Secondary | ICD-10-CM | POA: Diagnosis not present

## 2018-11-02 DIAGNOSIS — E785 Hyperlipidemia, unspecified: Secondary | ICD-10-CM | POA: Diagnosis not present

## 2018-11-02 DIAGNOSIS — I69354 Hemiplegia and hemiparesis following cerebral infarction affecting left non-dominant side: Secondary | ICD-10-CM | POA: Diagnosis not present

## 2018-11-02 DIAGNOSIS — N185 Chronic kidney disease, stage 5: Secondary | ICD-10-CM | POA: Diagnosis not present

## 2018-11-02 DIAGNOSIS — I2510 Atherosclerotic heart disease of native coronary artery without angina pectoris: Secondary | ICD-10-CM | POA: Diagnosis not present

## 2018-11-02 DIAGNOSIS — I130 Hypertensive heart and chronic kidney disease with heart failure and stage 1 through stage 4 chronic kidney disease, or unspecified chronic kidney disease: Secondary | ICD-10-CM | POA: Diagnosis not present

## 2018-11-02 DIAGNOSIS — I480 Paroxysmal atrial fibrillation: Secondary | ICD-10-CM | POA: Diagnosis not present

## 2018-11-03 ENCOUNTER — Other Ambulatory Visit: Payer: Self-pay | Admitting: Cardiovascular Disease

## 2018-11-03 DIAGNOSIS — E785 Hyperlipidemia, unspecified: Secondary | ICD-10-CM | POA: Diagnosis not present

## 2018-11-03 DIAGNOSIS — I69392 Facial weakness following cerebral infarction: Secondary | ICD-10-CM | POA: Diagnosis not present

## 2018-11-03 DIAGNOSIS — I272 Pulmonary hypertension, unspecified: Secondary | ICD-10-CM | POA: Diagnosis not present

## 2018-11-03 DIAGNOSIS — I480 Paroxysmal atrial fibrillation: Secondary | ICD-10-CM | POA: Diagnosis not present

## 2018-11-03 DIAGNOSIS — I2510 Atherosclerotic heart disease of native coronary artery without angina pectoris: Secondary | ICD-10-CM | POA: Diagnosis not present

## 2018-11-03 DIAGNOSIS — I130 Hypertensive heart and chronic kidney disease with heart failure and stage 1 through stage 4 chronic kidney disease, or unspecified chronic kidney disease: Secondary | ICD-10-CM | POA: Diagnosis not present

## 2018-11-03 DIAGNOSIS — I69354 Hemiplegia and hemiparesis following cerebral infarction affecting left non-dominant side: Secondary | ICD-10-CM | POA: Diagnosis not present

## 2018-11-03 DIAGNOSIS — I5042 Chronic combined systolic (congestive) and diastolic (congestive) heart failure: Secondary | ICD-10-CM | POA: Diagnosis not present

## 2018-11-03 DIAGNOSIS — I255 Ischemic cardiomyopathy: Secondary | ICD-10-CM | POA: Diagnosis not present

## 2018-11-03 DIAGNOSIS — N185 Chronic kidney disease, stage 5: Secondary | ICD-10-CM | POA: Diagnosis not present

## 2018-11-03 NOTE — Telephone Encounter (Signed)
Please schedule overdue follow-up appointment. Patient was no-show for last scheduled visit. Thank you!

## 2018-11-07 DIAGNOSIS — I639 Cerebral infarction, unspecified: Secondary | ICD-10-CM | POA: Diagnosis not present

## 2018-11-25 ENCOUNTER — Ambulatory Visit (INDEPENDENT_AMBULATORY_CARE_PROVIDER_SITE_OTHER): Payer: Medicare HMO | Admitting: Physician Assistant

## 2018-11-25 ENCOUNTER — Encounter: Payer: Self-pay | Admitting: Nurse Practitioner

## 2018-11-25 ENCOUNTER — Other Ambulatory Visit: Payer: Self-pay

## 2018-11-25 VITALS — BP 122/68 | HR 60 | Ht 77.0 in | Wt 242.8 lb

## 2018-11-25 DIAGNOSIS — I4892 Unspecified atrial flutter: Secondary | ICD-10-CM

## 2018-11-25 DIAGNOSIS — Z8673 Personal history of transient ischemic attack (TIA), and cerebral infarction without residual deficits: Secondary | ICD-10-CM

## 2018-11-25 DIAGNOSIS — I5022 Chronic systolic (congestive) heart failure: Secondary | ICD-10-CM

## 2018-11-25 DIAGNOSIS — I480 Paroxysmal atrial fibrillation: Secondary | ICD-10-CM

## 2018-11-25 DIAGNOSIS — Z9114 Patient's other noncompliance with medication regimen: Secondary | ICD-10-CM

## 2018-11-25 DIAGNOSIS — Z8719 Personal history of other diseases of the digestive system: Secondary | ICD-10-CM

## 2018-11-25 DIAGNOSIS — E785 Hyperlipidemia, unspecified: Secondary | ICD-10-CM

## 2018-11-25 DIAGNOSIS — I10 Essential (primary) hypertension: Secondary | ICD-10-CM

## 2018-11-25 DIAGNOSIS — Z91148 Patient's other noncompliance with medication regimen for other reason: Secondary | ICD-10-CM

## 2018-11-25 DIAGNOSIS — I428 Other cardiomyopathies: Secondary | ICD-10-CM

## 2018-11-25 MED ORDER — POTASSIUM CHLORIDE ER 20 MEQ PO TBCR: 1.0000 | EXTENDED_RELEASE_TABLET | Freq: Every day | ORAL | 3 refills | Status: DC

## 2018-11-25 MED ORDER — FUROSEMIDE 40 MG PO TABS: 40.0000 mg | ORAL_TABLET | Freq: Two times a day (BID) | ORAL | 3 refills | Status: DC

## 2018-11-25 NOTE — Patient Instructions (Signed)
Medication Instructions:  Your physician has recommended you make the following change in your medication:  1- Lasix Take 1 tablet (40 mg total) by mouth 2 (two) times daily 2- Potassium Cl Take 1 tablet by mouth daily If you need a refill on your cardiac medications before your next appointment, please call your pharmacy.   Lab work: Your physician recommends that you have lab work today(BMET, CBC)  If you have labs (blood work) drawn today and your tests are completely normal, you will receive your results only by: Marland Kitchen MyChart Message (if you have MyChart) OR . A paper copy in the mail If you have any lab test that is abnormal or we need to change your treatment, we will call you to review the results.  Testing/Procedures: None ordered   Follow-Up: At [facility], you and your health needs are our priority.  As part of our continuing mission to provide you with exceptional heart care, we have created designated Provider Care Teams.  These Care Teams include your primary Cardiologist (physician) and Advanced Practice Providers (APPs -  Physician Assistants and Nurse Practitioners) who all work together to provide you with the care you need, when you need it. You will need a follow up appointment in 6 months.  Please call our office 2 months in advance to schedule this appointment.  You may see Virl Axe, MD or Marrianne Mood, PA-C.

## 2018-11-25 NOTE — Progress Notes (Signed)
Office Visit    Patient Name: Nathan [name]. Date of Encounter: 11/25/2018  Primary Care Provider:  Patient, [name] Primary Cardiologist:  Ida Rogue, MD  Chief Complaint    68 year old male with history of atrial flutter s/p ablation (06/2017), PAF (07/2017), NICM, HFrEF (EF 40 to 45% 07/02/2018), hyperlipidemia, hypertension, PAH, noncompliance, and GI bleed (01/2018), and who is being seen today for follow-up regarding anticoagulation following stroke.   Past Medical History    Past Medical History:  Diagnosis Date   Atrial flutter ([place])    a. s/p TEE/DCCV 04/2017; b. 06/2017 s/p RFCA; c. CHADS2VASc => 5 (CHF, HTN, age x 1, CVA)-->noncompliant with Xarelto.   Chronic combined systolic and diastolic CHF (congestive heart failure) ([place])    a. TTE 2/19: EF 20-25%, diffuse HK; b. TTE 3/19: EF 20-25%, diffuse HK; c. 08/2017 Echo: EF 35-40%, diff HK. Gr1 DD; d. 06/2018 Echo: EF 40-45%, DD. Neg bubble study.   Essential hypertension    GI bleed    a.  Hemorrhagic shock in 11/19 secondary to erosive gastropathy and Barrett's esophagus requiring multiple units PRBCs; b. Cleared to resume OAC->pt did not.   NICM (nonischemic cardiomyopathy) ([place])    a. 04/2017: Echo EF 20-25%; b. TTE 3/19: EF 20-25%; c. 07/2017 Cath: min irregs; d. 08/2017 Echo: EF 35-40%, diff HK; e. 06/2018 Echo: EF 40-45%, DD.   Noncompliance with medications    PAF (paroxysmal atrial fibrillation) ([place])    a. 07/2017 afib->broke w/ IV amio->converted to oral bb due to prior intol to oral amio.   Pulmonary hypertension ([place])    Stroke ([place])    a. 06/2018 MRI/A Brain: R paramedian pons late acute/early subacute infarct, 77mm. No assoc hemorrhage or mass effect.  Sev chronic microvascular isch changtes and mod voluem loss. No large vessel occlusion, aneurysm, or significant stenosis.   Past Surgical History:  Procedure Laterality Date   A-FLUTTER ABLATION N/A 06/16/2017   Procedure: A-FLUTTER ABLATION;  Surgeon:  [name], MD;  Location: [place] [facility];  Service: Cardiovascular;  Laterality: N/A;   CARDIAC CATHETERIZATION     CARDIOVERSION N/A 05/01/2017   Procedure: CARDIOVERSION;  Surgeon: [name], MD;  Location: [facility] ORS;  Service: Cardiovascular;  Laterality: N/A;   CORONARY ANGIOPLASTY     ESOPHAGOGASTRODUODENOSCOPY (EGD) WITH PROPOFOL N/A 02/09/2018   Procedure: ESOPHAGOGASTRODUODENOSCOPY (EGD) WITH PROPOFOL;  Surgeon: [name], MD;  Location: [facility] ENDOSCOPY;  Service: Endoscopy;  Laterality: N/A;   RIGHT/LEFT HEART CATH AND CORONARY ANGIOGRAPHY N/A 07/18/2017   Procedure: RIGHT/LEFT HEART CATH AND CORONARY ANGIOGRAPHY;  Surgeon: [name], MD;  Location: [place] [facility];  Service: Cardiovascular;  Laterality: N/A;   TEE WITHOUT CARDIOVERSION N/A 05/01/2017   Procedure: TRANSESOPHAGEAL ECHOCARDIOGRAM (TEE);  Surgeon: [name], MD;  Location: [facility] ORS;  Service: Cardiovascular;  Laterality: N/A;   TEE WITHOUT CARDIOVERSION N/A 06/16/2017   Procedure: TRANSESOPHAGEAL ECHOCARDIOGRAM (TEE);  Surgeon: [name], MD;  Location: [facility] ENDOSCOPY;  Service: Cardiovascular;  Laterality: N/A;    Allergies  No Known Allergies  History of Present Illness    68 year old male with the above complex past medical history.  He was admitted to [facility] 04/2017 with new onset atrial flutter and CHF with an EF of 20 to 25%.  He was placed on Xarelto and underwent TEE/DCCV but could not afford Xarelto.  He was readmitted 05/2017 and atrial flutter with warfarin initiated.  He was transferred to [facility] for EP  evaluation and TEE with EF 15 to 20% and catheter ablation, which was successful, though he was noted to have intermittent atrial tachycardia post procedure and was started on amiodarone.  Warfarin was transitioned back to Xarelto prior to discharge.  Amiodarone ended up being discontinued due to intolerance (nausea and diarrhea).  He was readmitted 07/2017 with  recurrent atrial fibrillation and converted on IV amiodarone then maintained on oral beta-blocker.  Cath was carried out due to ongoing left ventricular dysfunction and showed only minor irregularities.  In 01/2018, he was admitted for hemorrhagic shock and massive GI bleed.  He required multiple units of PRBCs.  EGD showed erosive gastropathy and Barrett's esophagus.  He was cleared by GI to resume oral anticoagulation at discharge but never did.  He was seen in clinic 03/2018 and advised to resume Xarelto but did not for fear of recurrent GI bleed.  In 06/2018, he had sudden onset of left-sided weakness, facial droop, and slurred speech.  MRI/a showed right paramedian pons late acute/early subacute infarct, 22 mm.  Echo was updated at and showed negative saline contrast bubble study.  Cardiology consulted and patient started on Eliquis 5 mg twice daily with restart per neuro and recommended PPI given history of GIB.  Coupon provided for Eliquis as requested.  Since that time, patient has reportedly been doing well from a cardiac standpoint. He denied any chest pain, palpitations, or racing HR. He is completing physical therapy and has noted that he continues to grow stronger with reduced L sided weakness. He is ambulating with a cane but denies SOB/DOE with ambulation. He reported that he is hopeful to return to work at [employer] in November 2020, as he does not wish to remain idle at home. He intends to start walking daily once he is strong enough from physical therapy. He reported that he drinks 1 beer at most a day. He typically eats eggs and bacon for breakfast and reported a diet high in meat. He stated that he does not like very many vegetables but will eat turnip salad with vinegar. He is trying to eat more fruits. He does not drink coffee or tea. He typically drinks around 2L of water a day but denied any other intake of fluids. He denied orthopnea, LEE, and PND. He reported that he uses two pillows  at night; however, this is for comfort purposes only. He checks his blood pressure at home with SBP in the 120s consistently. He weighs himself at home as well and tries to keep his weight under 240 lbs (with clinic weight today 242lbs but euvolemic on exam). Of note, in addition to PT, he follows with the HF clinic. He stated that he recently changed the way he takes his medications and now takes Lasix 40mg  BID (previously Lasix 20mg  once daily) and KCl tab 36mEq daily (previously KCl 59mEq twice daily). He reported medication compliance and that he takes the remaineder of his medications as indicated on his list. No symptoms of pre-syncope or syncope. No s/sx of recurrent GIB on anticoagulation, including melena, BRBPR, hematochezia, and hematuria. He continues to abstain from smoking cigarettes and reported he has not smoked for two years.   Previous labs: 07/07/2018: Cr 1.49, BUN 33, K 4.0.  06/30/2018: Hgb 12.5. 07/01/2018: LDL 121.  Home Medications    Prior to Admission medications   Medication Sig Start Date End Date Taking? Authorizing Provider  apixaban (ELIQUIS) 5 MG TABS tablet Take 1 tablet (5 mg total) by mouth  2 (two) times daily. 08/26/18   Dunn, Areta Haber, PA-C  atorvastatin (LIPITOR) 80 MG tablet Take 1 tablet (80 mg total) by mouth daily. 08/26/18 08/26/19  Rise Mu, PA-C  furosemide (LASIX) 40 MG tablet Take 1/2 tablet (20 mg) by mouth once daily Patient taking differently: 40 mg 2 (two) times daily. Take 1/2 tablet (20 mg) by mouth once daily 04/22/18   Theora Gianotti, NP  losartan (COZAAR) 100 MG tablet Take 1 tablet by mouth once daily 08/11/18   [name], MD  metoprolol succinate (TOPROL-XL) 50 MG 24 hr tablet TAKE 1 TABLET BY MOUTH DAILY. TAKE WITH OR IMMEDIATELY FOLLOWING A MEAL 08/03/18   [name], MD  pantoprazole (PROTONIX) 40 MG tablet Take 1 tablet (40 mg total) by mouth daily at 6 (six) AM. 08/26/18   Dunn, Areta Haber, PA-C  Potassium Chloride ER 20  MEQ TBCR Take 1 tablet by mouth twice daily 10/12/18   Alisa Graff, FNP  spironolactone (ALDACTONE) 25 MG tablet Take 1 tablet by mouth once daily 11/03/18   [name], MD    Review of Systems    He denies chest pain, palpitations, dyspnea, pnd, orthopnea, n, v, dizziness, syncope, edema, weight gain, or early satiety.   All other systems reviewed and are otherwise negative except as noted above.  Physical Exam    VS:  BP 122/68 (BP Location: Right Arm, Patient Position: Sitting, Cuff Size: Normal)    Pulse 60    Ht 6\' 5"  (1.956 m)    Wt 242 lb 12 oz (110.1 kg)    SpO2 96%    BMI 28.79 kg/m  , BMI Body mass index is 28.79 kg/m. GEN: Well nourished, well developed, in no acute distress. Sitting in wheelchair  HEENT: normal. Neck: Supple, no JVD, carotid bruit, masses. Cardiac: RRR, no murmurs, rubs, or gallops. No clubbing, cyanosis, edema.  Radials/DP/PT 2+ and equal bilaterally.  Respiratory:  Respirations regular and unlabored, clear to auscultation bilaterally. GI: Soft, nontender, nondistended, BS + x 4. MS: no deformity or atrophy. Skin: warm and dry, no rash. Neuro:  Strength and sensation are intact. Psych: Normal affect.  Accessory Clinical Findings    ECG personally reviewed by me today - NSR, 60bpm, LAD, baseline artifact - no acute changes.  TTE 07/02/2018 1. The left ventricle has mild-moderately reduced systolic function, with an ejection fraction of 40-45%. The cavity size was normal. Left ventricular diastolic Doppler parameters are consistent with impaired relaxation. Unable to exclude regional wall  motion abnormality. 2. The right ventricle has normal systolic function. The cavity was normal. There is no increase in right ventricular wall thickness. 3. Negative saline contrast bubble study 4. EF improved compared to prior study in 2019  Assessment & Plan    CVA --History of atrial fibrillation/atrial flutter s/p RF CVA in 06/2017, hypertension,  noncompliance with oral anticoagulation following 01/2018 GIB.  Admitted 4/14 with 1 day history of left-sided weakness, facial droop, slurred speech.  Work-up showed early subacute infarct.  Echo as above and negative saline contrast bubble study. Continue Eliquis 5mg  twice daily with PPI.  Continues to follow-up with PT. Reported stronger on the L side.   PAF (06/2017); PAF (07/2017) --CHA2DS2VASc score of at least  5 (CHF, age, HTN, stroke, vascular). At last discharge 06/2018, NSR with short runs NSVT. Remains in SR today. Denies any recent racing HR or palpitations.  --Continue Eliquis 5mg  BID. Reported compliance and denied s/sx of bleeding. CBC  obtained today. --Continue BB with rate well controlled today.  NICM/HFrEF (EF 40-45%, 07/02/2018) --Echo as above with EF reduced yet improved from 08/2017. Unclear role of previous AT contributing to HF.  --Euvolemic on exam, despite weight increase from 236lbs (7/31)  242lbs today. Reportedly taking lasix 40mg  twice daily now with KCl 51mEq down to once daily, per [facility]. Will update medication list and obtain BMET to check renal function and potassium with this increase in diuretic dose and decrease in potassium dose.  Dietary and lifestyle recommendations provided, including adding more vegetables / fiber and increasing activity as tolerated.  --Continue ARB, BB, spironolactone.  HLD --LDL 121 on 06/2018. --Continue statin therapy.  HTN --Well controlled today. Reported home BP also well controlled.   --Continue BB, ARB, spironolactone.  --Check BMET to monitor renal function and electrolytes.   H/o GIB (01/2018) --PPI added with [place].  --Patient denied any s/sx of bleeding today. --Check CBC.  History of medication compliance --Reportedly taking all medications and understands risks associated with noncompliance.    Disposition: Labs obtained today: BMET, CBC; Follow-up in 6 months.  Arvil Chaco, PA-C 11/25/2018, 5:38 PM

## 2018-11-26 ENCOUNTER — Telehealth: Payer: Self-pay | Admitting: *Deleted

## 2018-11-26 LAB — BASIC METABOLIC PANEL
BUN/Creatinine Ratio: 13 (ref 10–24)
BUN: 17 mg/dL (ref 8–27)
CO2: 21 mmol/L (ref 20–29)
Calcium: 9.7 mg/dL (ref 8.6–10.2)
Chloride: 102 mmol/L (ref 96–106)
Creatinine, Ser: 1.31 mg/dL — ABNORMAL HIGH (ref 0.76–1.27)
GFR calc Af Amer: 65 mL/min/{1.73_m2} (ref >59)
GFR calc non Af Amer: 56 mL/min/{1.73_m2} — ABNORMAL LOW (ref >59)
Glucose: 83 mg/dL (ref 65–99)
Potassium: 4.6 mmol/L (ref 3.5–5.2)
Sodium: 140 mmol/L (ref 134–144)

## 2018-11-26 LAB — CBC
Hematocrit: 33.3 % — ABNORMAL LOW (ref 37.5–51.0)
Hemoglobin: 11.3 g/dL — ABNORMAL LOW (ref 13.0–17.7)
MCH: 28.5 pg (ref 26.6–33.0)
MCHC: 33.9 g/dL (ref 31.5–35.7)
MCV: 84 fL (ref 79–97)
Platelets: 262 10*3/uL (ref 150–450)
RBC: 3.97 x10E6/uL — ABNORMAL LOW (ref 4.14–5.80)
RDW: 14.2 % (ref 11.6–15.4)
WBC: 5.3 10*3/uL (ref 3.4–10.8)

## 2018-11-26 NOTE — Telephone Encounter (Signed)
-----   Message from Theora Gianotti, NP sent at 11/26/2018  8:07 AM EDT ----- Renal fxn, lytes, blood counts stable.

## 2018-11-26 NOTE — Telephone Encounter (Signed)
Results called to pt. Pt verbalized understanding.  

## 2018-11-26 NOTE — Telephone Encounter (Signed)
No answer. Left message to call back.   

## 2018-11-30 ENCOUNTER — Other Ambulatory Visit: Payer: Self-pay | Admitting: Cardiovascular Disease

## 2018-12-03 ENCOUNTER — Telehealth (HOSPITAL_COMMUNITY): Payer: Self-pay

## 2018-12-03 NOTE — Telephone Encounter (Signed)
Today was a telephone visit with Nathan [name].  He states been doing well.  He has all his medications and knows how to take them, verified them.  He states weighs daily and weight is 242 lbs.  He is aware of appts.  He denies chest pain, shortness of breath, headaches or dizziness.  He states getting around good and feels good.  He denies any problems or anything I can do for him.  He request I call and check on him.  Will continue to visit for heart failure.   [place] [phone]

## 2018-12-08 ENCOUNTER — Telehealth (HOSPITAL_COMMUNITY): Payer: Self-pay

## 2018-12-08 NOTE — Telephone Encounter (Signed)
Today had a telephone appt with Nathan [name].  He states doing well.  He is aware of how to take his medications and has all of them, they are affordable for him.  He has went through PT and is getting around much better.  He is watching his diet and fluids.  He has no swelling and complains of no chest pain.  He has been to cardiology appts and appears to be doing ok.  Informed hom of discharging him from my program and he understands.  He is aware to call if he has any problems and he can be added back to the program.  If he has hospitalizations he can be added back.  Darylene Price with HF clinic is aware and agrees.   [place] [phone]

## 2019-02-24 ENCOUNTER — Other Ambulatory Visit: Payer: Self-pay | Admitting: Cardiovascular Disease

## 2019-03-01 ENCOUNTER — Other Ambulatory Visit: Payer: Self-pay | Admitting: Physician Assistant

## 2019-03-25 ENCOUNTER — Other Ambulatory Visit
Admission: RE | Admit: 2019-03-25 | Discharge: 2019-03-25 | Disposition: A | Discharge status: Home / Self Care | Payer: Medicare Other | Attending: Internal Medicine | Admitting: Internal Medicine

## 2019-03-25 DIAGNOSIS — I639 Cerebral infarction, unspecified: Secondary | ICD-10-CM | POA: Insufficient documentation

## 2019-03-25 DIAGNOSIS — N400 Enlarged prostate without lower urinary tract symptoms: Secondary | ICD-10-CM | POA: Diagnosis present

## 2019-03-25 LAB — CBC WITH DIFFERENTIAL/PLATELET
Abs Immature Granulocytes: 0.01 10*3/uL (ref 0.00–0.07)
Basophils Absolute: 0.1 10*3/uL (ref 0.0–0.1)
Basophils Relative: 1 %
Eosinophils Absolute: 0.4 10*3/uL (ref 0.0–0.5)
Eosinophils Relative: 7 %
HCT: 35.4 % — ABNORMAL LOW (ref 39.0–52.0)
Hemoglobin: 11.7 g/dL — ABNORMAL LOW (ref 13.0–17.0)
Immature Granulocytes: 0 %
Lymphocytes Relative: 26 %
Lymphs Abs: 1.4 10*3/uL (ref 0.7–4.0)
MCH: 28.9 pg (ref 26.0–34.0)
MCHC: 33.1 g/dL (ref 30.0–36.0)
MCV: 87.4 fL (ref 80.0–100.0)
Monocytes Absolute: 0.5 10*3/uL (ref 0.1–1.0)
Monocytes Relative: 10 %
Neutro Abs: 3 10*3/uL (ref 1.7–7.7)
Neutrophils Relative %: 56 %
Platelets: 262 10*3/uL (ref 150–400)
RBC: 4.05 MIL/uL — ABNORMAL LOW (ref 4.22–5.81)
RDW: 14.3 % (ref 11.5–15.5)
WBC: 5.4 10*3/uL (ref 4.0–10.5)
nRBC: 0 % (ref 0.0–0.2)

## 2019-03-25 LAB — COMPREHENSIVE METABOLIC PANEL
ALT: 19 U/L (ref 0–44)
AST: 22 U/L (ref 15–41)
Albumin: 4 g/dL (ref 3.5–5.0)
Alkaline Phosphatase: 71 U/L (ref 38–126)
Anion gap: 11 (ref 5–15)
BUN: 21 mg/dL (ref 8–23)
CO2: 24 mmol/L (ref 22–32)
Calcium: 9.6 mg/dL (ref 8.9–10.3)
Chloride: 104 mmol/L (ref 98–111)
Creatinine, Ser: 1.88 mg/dL — ABNORMAL HIGH (ref 0.61–1.24)
GFR calc Af Amer: 42 mL/min — ABNORMAL LOW (ref >60)
GFR calc non Af Amer: 36 mL/min — ABNORMAL LOW (ref >60)
Glucose, Bld: 105 mg/dL — ABNORMAL HIGH (ref 70–99)
Potassium: 5.3 mmol/L — ABNORMAL HIGH (ref 3.5–5.1)
Sodium: 139 mmol/L (ref 135–145)
Total Bilirubin: 0.7 mg/dL (ref 0.3–1.2)
Total Protein: 8 g/dL (ref 6.5–8.1)

## 2019-03-25 LAB — LIPID PANEL
Cholesterol: 161 mg/dL (ref 0–200)
HDL: 79 mg/dL (ref >40)
LDL Cholesterol: 61 mg/dL (ref 0–99)
Total CHOL/HDL Ratio: 2 RATIO
Triglycerides: 103 mg/dL (ref <150)
VLDL: 21 mg/dL (ref 0–40)

## 2019-03-25 LAB — PSA: Prostatic Specific Antigen: 38.5 ng/mL — ABNORMAL HIGH (ref 0.00–4.00)

## 2019-04-16 ENCOUNTER — Ambulatory Visit: Payer: Medicare HMO | Admitting: Family

## 2019-04-17 NOTE — Progress Notes (Signed)
Patient ID: Nathan Tullos., male    DOB: 09-Mar-1951, 69 y.o.   MRN: [id number]  HPI  Nathan [name] is a 69 y/o male with a history of HTN, stroke, previous tobacco use and chronic heart failure.   Echo report from 07/01/2018 reviewed and showed an EF of 40-45%. Echo report from 09/04/17 reviewed and showed an EF of 35-40%. Echo report from 06/16/17 reviewed and showed an EF of 15-20% along with mod-severe TR and an elevated PA pressure of 45 mm Hg.  Cardiac catheterization done 07/18/17 and showed heavily calcified coronary arteries with mild nonobstructive disease.  Left dominant system. Right heart catheterization showed moderately to severely elevated filling pressures, severe pulmonary hypertension and severely reduced cardiac output.   Has not been admitted or been in the ED in the last 6 months.    He presents today with a chief complaint of a follow-up visit. He does endorse gradual weight gain but says that it's because he's just "eating too much" with COVID quarantine. He denies any difficulty sleeping, dizziness, abdominal distention, palpitations, pedal edema, chest pain, shortness of breath, cough or fatigue.   Past Medical History:  Diagnosis Date  . Atrial flutter ([place])    a. s/p TEE/DCCV 04/2017; b. 06/2017 s/p RFCA; c. CHADS2VASc => 5 (CHF, HTN, age x 1, CVA)-->noncompliant with Xarelto.  . Chronic combined systolic and diastolic CHF (congestive heart failure) ([place])    a. TTE 2/19: EF 20-25%, diffuse HK; b. TTE 3/19: EF 20-25%, diffuse HK; c. 08/2017 Echo: EF 35-40%, diff HK. Gr1 DD; d. 06/2018 Echo: EF 40-45%, DD. Neg bubble study.  . Essential hypertension   . GI bleed    a.  Hemorrhagic shock in 11/19 secondary to erosive gastropathy and Barrett's esophagus requiring multiple units PRBCs; b. Cleared to resume OAC->pt did not.  Marland Kitchen NICM (nonischemic cardiomyopathy) ([place])    a. 04/2017: Echo EF 20-25%; b. TTE 3/19: EF 20-25%; c. 07/2017 Cath: min irregs; d. 08/2017 Echo: EF 35-40%, diff HK; e.  06/2018 Echo: EF 40-45%, DD.  Marland Kitchen Noncompliance with medications   . PAF (paroxysmal atrial fibrillation) ([place])    a. 07/2017 afib->broke w/ IV amio->converted to oral bb due to prior intol to oral amio.  . Pulmonary hypertension ([place])   . Stroke [facility])    a. 06/2018 MRI/A Brain: R paramedian pons late acute/early subacute infarct, 17mm. No assoc hemorrhage or mass effect.  Sev chronic microvascular isch changtes and mod voluem loss. No large vessel occlusion, aneurysm, or significant stenosis.   Past Surgical History:  Procedure Laterality Date  . A-FLUTTER ABLATION N/A 06/16/2017   Procedure: A-FLUTTER ABLATION;  Surgeon: [name], MD;  Location: [place] [facility] LAB;  Service: Cardiovascular;  Laterality: N/A;  . CARDIAC CATHETERIZATION    . CARDIOVERSION N/A 05/01/2017   Procedure: CARDIOVERSION;  Surgeon: [name], MD;  Location: [facility] ORS;  Service: Cardiovascular;  Laterality: N/A;  . CORONARY ANGIOPLASTY    . ESOPHAGOGASTRODUODENOSCOPY (EGD) WITH PROPOFOL N/A 02/09/2018   Procedure: ESOPHAGOGASTRODUODENOSCOPY (EGD) WITH PROPOFOL;  Surgeon: [name], MD;  Location: [facility] ENDOSCOPY;  Service: Endoscopy;  Laterality: N/A;  . RIGHT/LEFT HEART CATH AND CORONARY ANGIOGRAPHY N/A 07/18/2017   Procedure: RIGHT/LEFT HEART CATH AND CORONARY ANGIOGRAPHY;  Surgeon: [name], MD;  Location: [place] CV LAB;  Service: Cardiovascular;  Laterality: N/A;  . TEE WITHOUT CARDIOVERSION N/A 05/01/2017   Procedure: TRANSESOPHAGEAL ECHOCARDIOGRAM (TEE);  Surgeon: [name], MD;  Location: [facility] ORS;  Service:  Cardiovascular;  Laterality: N/A;  . TEE WITHOUT CARDIOVERSION N/A 06/16/2017   Procedure: TRANSESOPHAGEAL ECHOCARDIOGRAM (TEE);  Surgeon: [name], MD;  Location: [facility] ENDOSCOPY;  Service: Cardiovascular;  Laterality: N/A;   Family History  Problem Relation Age of Onset  . Alzheimer's disease Mother   . Alzheimer's disease Father    Social History    Tobacco Use  . Smoking status: Former Smoker    Quit date: 04/21/2017    Years since quitting: 1.9  . Smokeless tobacco: Never Used  Substance Use Topics  . Alcohol use: Yes    Alcohol/week: 3.0 standard drinks    Types: 3 Cans of beer per week    Comment: Drink a half of a 40oz beer every day, drank heavily in the past   No Known Allergies  Prior to Admission medications   Medication Sig Start Date End Date Taking? Authorizing Provider  apixaban (ELIQUIS) 5 MG TABS tablet Take 1 tablet (5 mg total) by mouth 2 (two) times daily. 08/26/18  Yes Dunn, Areta Haber, PA-C  atorvastatin (LIPITOR) 80 MG tablet Take 1 tablet by mouth once daily 03/01/19  Yes Dunn, Areta Haber, PA-C  furosemide (LASIX) 40 MG tablet Take 1 tablet (40 mg total) by mouth 2 (two) times daily. 11/25/18  Yes Theora Gianotti, NP  losartan (COZAAR) 100 MG tablet Take 1 tablet by mouth once daily 11/30/18  Yes Gollan, Kathlene November, MD  metoprolol succinate (TOPROL-XL) 50 MG 24 hr tablet TAKE 1 TABLET BY MOUTH ONCE DAILY. TAKE WITH OR IMMEDAITELY FOLLOWING A MEAL. 02/24/19  Yes [name], MD  pantoprazole (PROTONIX) 40 MG tablet Take 1 tablet (40 mg total) by mouth daily at 6 (six) AM. 08/26/18  Yes Dunn, Areta Haber, PA-C  Potassium Chloride ER 20 MEQ TBCR Take 1 tablet by mouth daily. 11/25/18  Yes Theora Gianotti, NP  spironolactone (ALDACTONE) 25 MG tablet Take 1 tablet by mouth once daily 11/03/18  Yes Gollan, Kathlene November, MD     Review of Systems  Constitutional: Negative for appetite change and fatigue.  HENT: Negative for congestion, postnasal drip and sore throat.   Eyes: Negative.   Respiratory: Negative for chest tightness and shortness of breath.   Cardiovascular: Negative for chest pain, palpitations and leg swelling.  Gastrointestinal: Negative for abdominal distention and abdominal pain.  Endocrine: Negative.   Genitourinary: Negative.   Musculoskeletal: Negative for back pain and neck pain.  Skin:  Negative.   Allergic/Immunologic: Negative.   Neurological: Negative for dizziness and light-headedness.  Hematological: Negative for adenopathy. Does not bruise/bleed easily.  Psychiatric/Behavioral: Negative for dysphoric mood and sleep disturbance (sleeping on 2 pillows). The patient is not nervous/anxious.    Vitals:   04/19/19 0950  BP: 134/79  Pulse: 90  Resp: 18  SpO2: 100%  Weight: 252 lb 6.4 oz (114.5 kg)  Height: 6\' 5"  (1.956 m)   Wt Readings from Last 3 Encounters:  04/19/19 252 lb 6.4 oz (114.5 kg)  11/25/18 242 lb 12 oz (110.1 kg)  10/16/18 236 lb 6 oz (107.2 kg)   Lab Results  Component Value Date   CREATININE 1.88 (H) 03/25/2019   CREATININE 1.31 (H) 11/25/2018   CREATININE 1.49 (H) 07/07/2018     Physical Exam  Constitutional: He is oriented to person, place, and time. He appears well-developed and well-nourished.  HENT:  Head: Normocephalic and atraumatic.  Neck: No JVD present.  Cardiovascular: Normal rate and regular rhythm.  Pulmonary/Chest: Effort normal. No respiratory distress.  He has no wheezes. He has no rales.  Abdominal: Soft. He exhibits no distension. There is no abdominal tenderness.  Musculoskeletal:        General: No tenderness or edema.     Cervical back: Normal range of motion and neck supple.  Neurological: He is alert and oriented to person, place, and time.  Skin: Skin is warm and dry.  Psychiatric: He has a normal mood and affect. His behavior is normal. Thought content normal.  Nursing note and vitals reviewed.   Assessment & Plan:  1: Chronic heart failure with mildly reduced ejection fraction- - NYHA class I - euvolemic today - weighing daily; Reviewed the importance of calling for any overnight weight gain of >2 pounds or a weekly weight gain of >5 pounds - weight up 16 pounds from last visit here 6 months ago  - says that he's eating "too much"; encouraged him to increase his activity and to be mindful of what and how  much he is eating - not adding salt to his food and tries to eat low sodium foods. Reminded to closely follow a 2000mg  sodium diet  - saw cardiology Mickle Plumb) 11/25/2018 - BNP on 07/15/17 was 4283.0 - EF >40% so would not qualify for entresto  2: HTN- - BP looks good today - seeing PCP (Tejan-Sie) in 2 days - BMP from 03/25/19 reviewed and showed sodium 139, potassium 5.3, creatinine 1.88 and GFR 42 - will stop potassium supplements due to mild hyperkalemia; continue spironolactone   Medication bottles were reviewed.  Return in 6 months or sooner for any questions/problems before then.

## 2019-04-19 ENCOUNTER — Encounter: Payer: Self-pay | Admitting: Family

## 2019-04-19 ENCOUNTER — Other Ambulatory Visit: Payer: Self-pay

## 2019-04-19 ENCOUNTER — Ambulatory Visit: Payer: Medicare Other | Attending: Family | Admitting: Family

## 2019-04-19 ENCOUNTER — Other Ambulatory Visit: Payer: Self-pay | Admitting: Physician Assistant

## 2019-04-19 VITALS — BP 134/79 | HR 90 | Resp 18 | Ht 77.0 in | Wt 252.4 lb

## 2019-04-19 DIAGNOSIS — I5022 Chronic systolic (congestive) heart failure: Secondary | ICD-10-CM

## 2019-04-19 DIAGNOSIS — Z7901 Long term (current) use of anticoagulants: Secondary | ICD-10-CM | POA: Insufficient documentation

## 2019-04-19 DIAGNOSIS — I5042 Chronic combined systolic (congestive) and diastolic (congestive) heart failure: Secondary | ICD-10-CM | POA: Insufficient documentation

## 2019-04-19 DIAGNOSIS — Z8719 Personal history of other diseases of the digestive system: Secondary | ICD-10-CM | POA: Insufficient documentation

## 2019-04-19 DIAGNOSIS — Z8673 Personal history of transient ischemic attack (TIA), and cerebral infarction without residual deficits: Secondary | ICD-10-CM | POA: Diagnosis not present

## 2019-04-19 DIAGNOSIS — I110 Hypertensive heart disease with heart failure: Secondary | ICD-10-CM | POA: Diagnosis not present

## 2019-04-19 DIAGNOSIS — I272 Pulmonary hypertension, unspecified: Secondary | ICD-10-CM | POA: Diagnosis not present

## 2019-04-19 DIAGNOSIS — Z87891 Personal history of nicotine dependence: Secondary | ICD-10-CM | POA: Insufficient documentation

## 2019-04-19 DIAGNOSIS — E875 Hyperkalemia: Secondary | ICD-10-CM | POA: Diagnosis not present

## 2019-04-19 DIAGNOSIS — Z79899 Other long term (current) drug therapy: Secondary | ICD-10-CM | POA: Insufficient documentation

## 2019-04-19 DIAGNOSIS — I480 Paroxysmal atrial fibrillation: Secondary | ICD-10-CM | POA: Diagnosis not present

## 2019-04-19 DIAGNOSIS — I10 Essential (primary) hypertension: Secondary | ICD-10-CM

## 2019-04-19 NOTE — Patient Instructions (Addendum)
Continue weighing daily and call for an overnight weight gain of > 2 pounds or a weekly weight gain of >5 pounds.  Stop taking the potassium and continue taking spironolactone

## 2019-04-28 IMAGING — DX DG CHEST 1V PORT
1 series · 1 of 1 positions shown · non-contrast
Comparison: 07/15/2017

CLINICAL DATA: Intubation. Hematemesis for 1 week. History of
congestive heart failure and hypertension.

EXAM:
PORTABLE CHEST 1 VIEW

[chest ap]
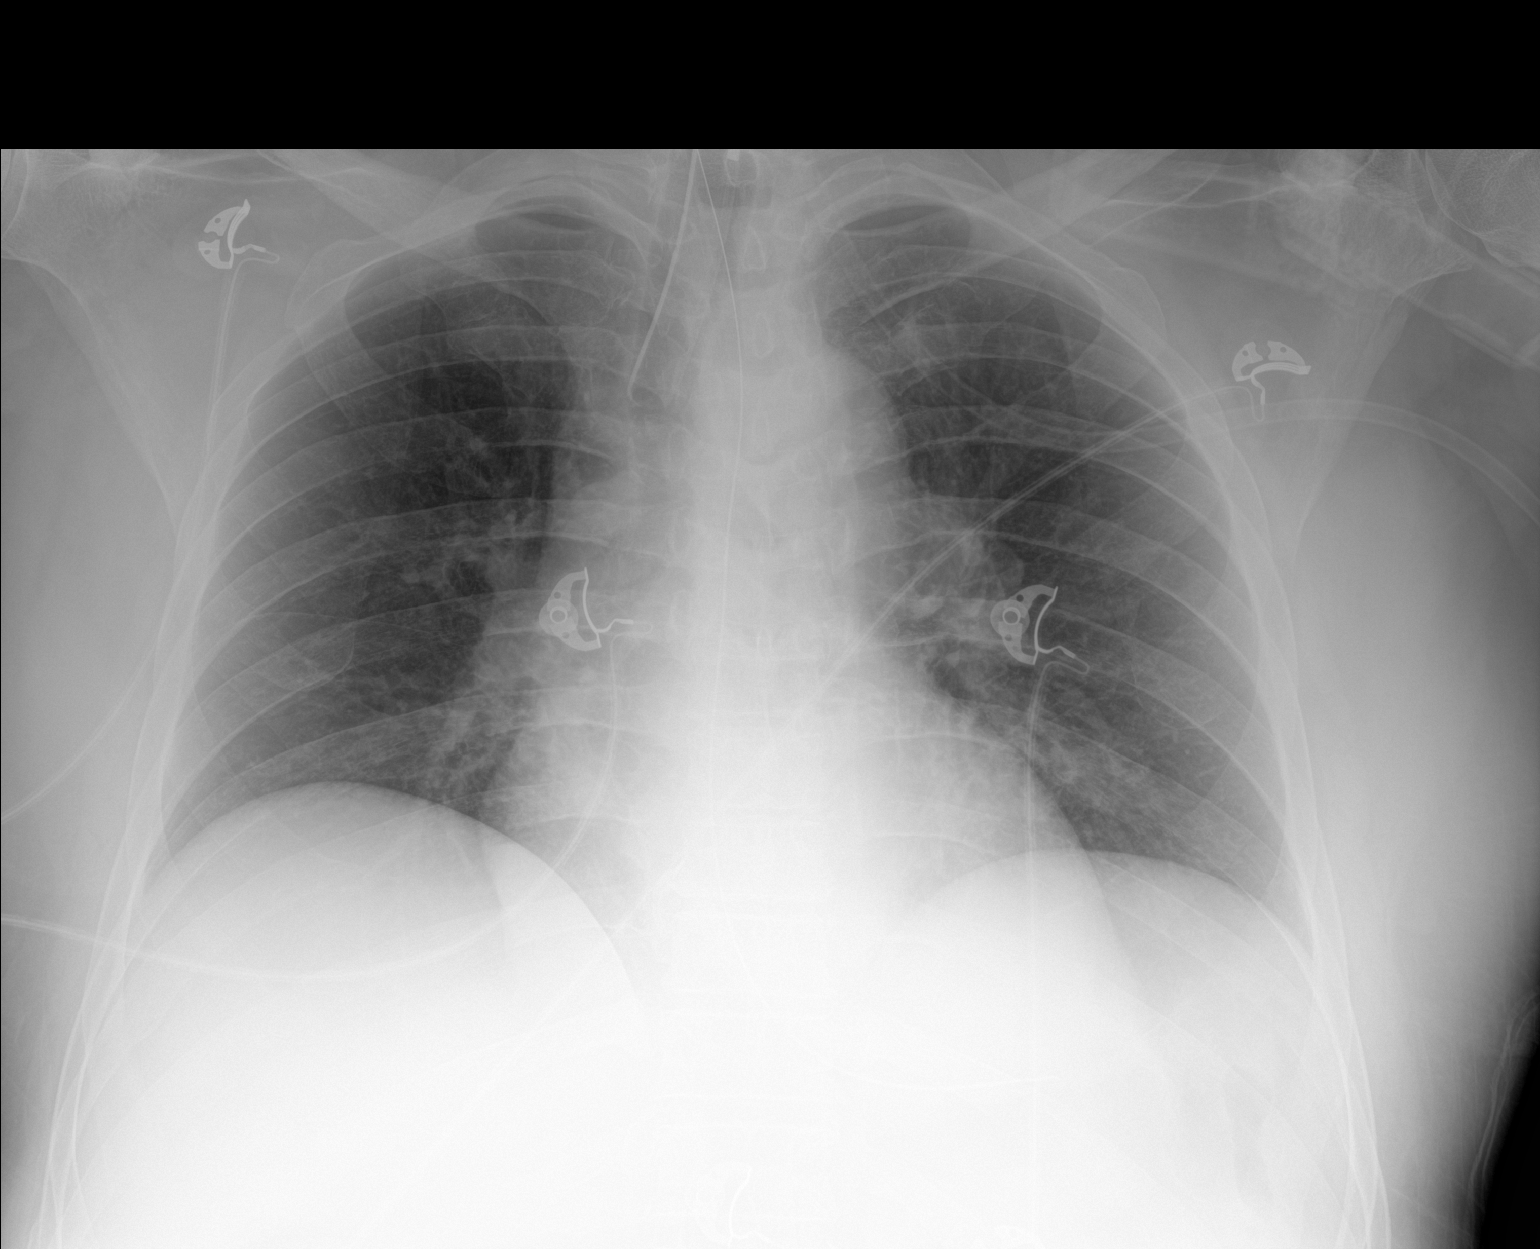

[1 of 1 positions shown; findings below may reference images not displayed]

FINDINGS: Endotracheal tube tip measures 3.5 cm above the carina. Enteric tube
tip is over the left upper quadrant consistent with location in the
upper stomach. Shallow inspiration. Heart size and pulmonary
vascularity are normal for technique. No airspace disease or
consolidation in the lungs. No blunting of costophrenic angles. No
pneumothorax. Mediastinal contours appear intact.
IMPRESSION: Appliances appear in satisfactory location. No evidence of active
pulmonary disease.

## 2019-04-30 IMAGING — DX DG CHEST 1V PORT
1 series · 1 of 1 positions shown · non-contrast
Comparison: Prior radiograph from 02/07/2018.

CLINICAL DATA: Initial evaluation for acute cough.

EXAM:
PORTABLE CHEST 1 VIEW

[chest ap]
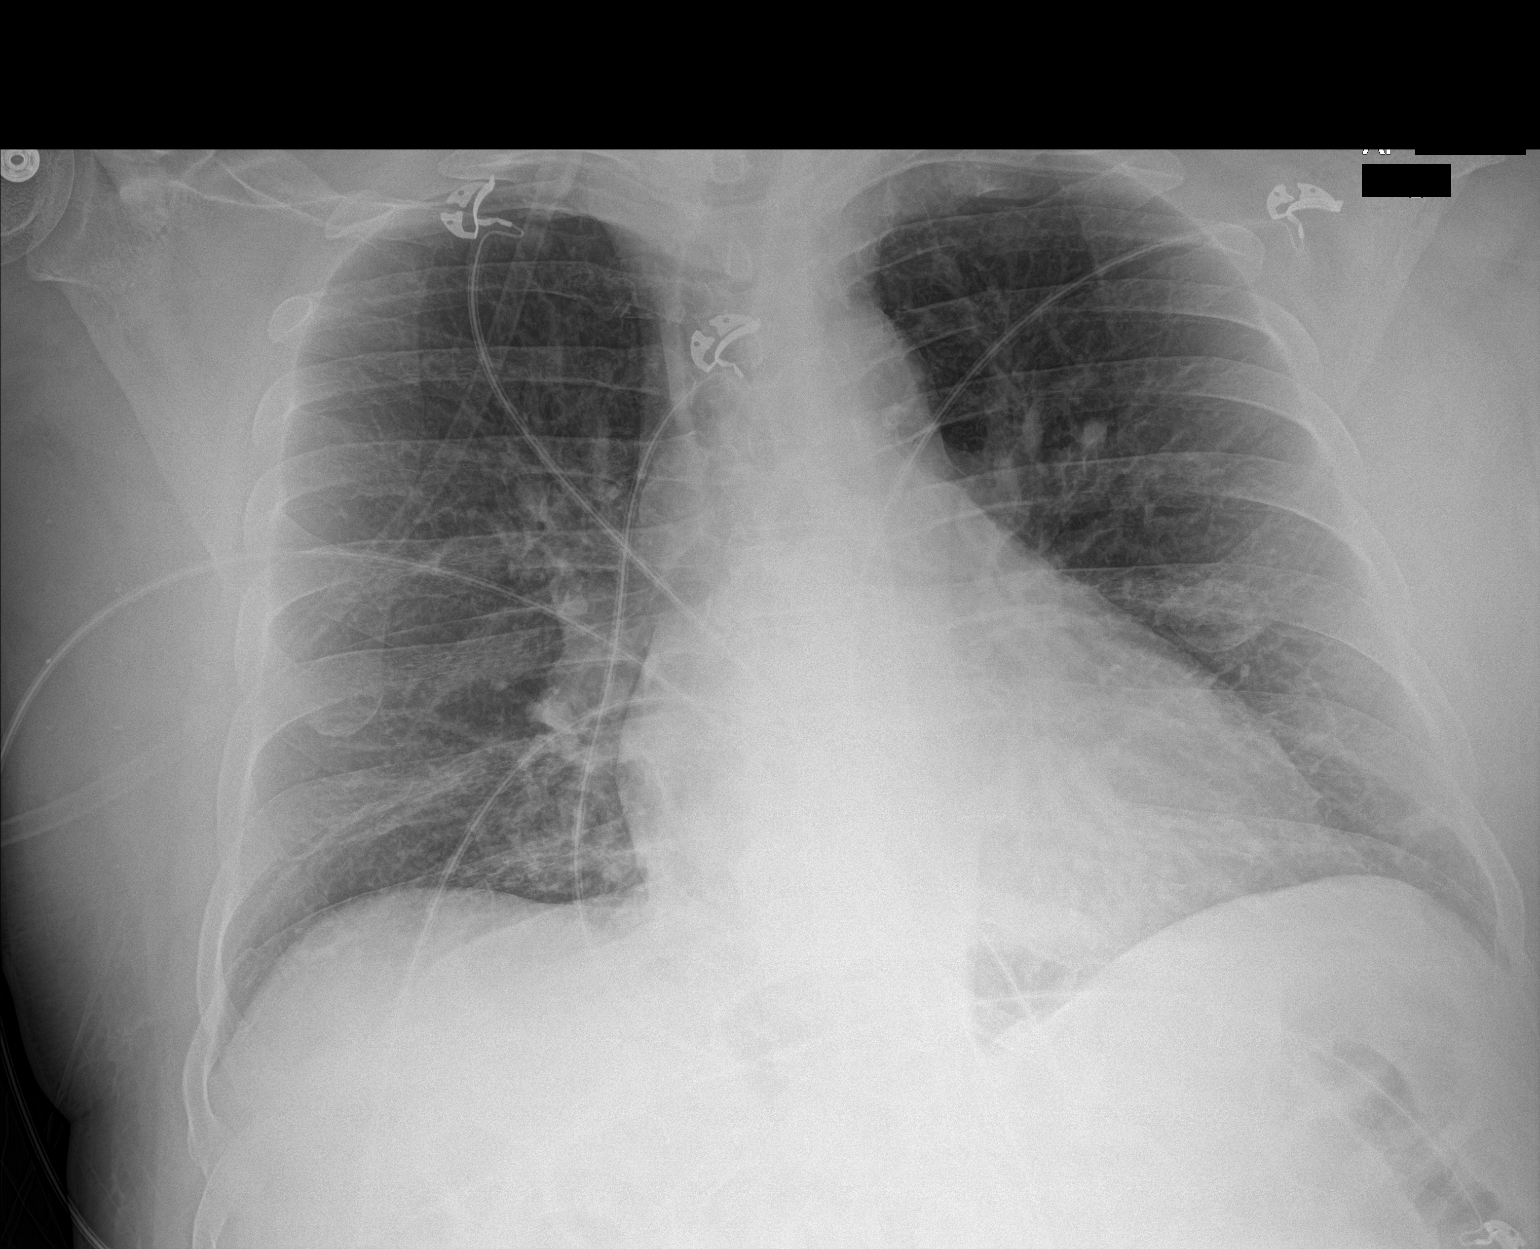

[1 of 1 positions shown; findings below may reference images not displayed]

FINDINGS: Cardiomegaly, stable from previous. Mediastinal silhouette within
normal limits.

Lungs mildly hypoinflated. Mild diffuse pulmonary vascular
congestion without overt pulmonary edema. No consolidative airspace
opacity. No pleural effusion. No pneumothorax.

No acute osseous abnormality.
IMPRESSION: 1. Cardiomegaly with mild diffuse pulmonary vascular congestion
without overt pulmonary edema.
2. No other active cardiopulmonary disease.

## 2019-06-14 ENCOUNTER — Encounter: Payer: Self-pay | Admitting: Urology

## 2019-06-14 ENCOUNTER — Ambulatory Visit: Payer: Medicare Other | Admitting: Urology

## 2019-06-15 ENCOUNTER — Other Ambulatory Visit: Payer: Self-pay | Admitting: Physician Assistant

## 2019-06-15 NOTE — Telephone Encounter (Signed)
Refill Request.  

## 2019-06-28 ENCOUNTER — Other Ambulatory Visit: Payer: Self-pay | Admitting: Cardiovascular Disease

## 2019-06-29 ENCOUNTER — Other Ambulatory Visit: Payer: Self-pay

## 2019-06-29 MED ORDER — METOPROLOL SUCCINATE ER 50 MG PO TB24: ORAL_TABLET | ORAL | 0 refills | Status: DC

## 2019-07-19 ENCOUNTER — Other Ambulatory Visit: Payer: Self-pay | Admitting: Physician Assistant

## 2019-07-29 ENCOUNTER — Ambulatory Visit: Payer: Medicare Other | Admitting: Internal Medicine

## 2019-08-10 ENCOUNTER — Other Ambulatory Visit: Payer: Self-pay

## 2019-08-10 MED ORDER — ATORVASTATIN CALCIUM 80 MG PO TABS: 80.0000 mg | ORAL_TABLET | Freq: Every day | ORAL | 0 refills | Status: DC

## 2019-08-23 ENCOUNTER — Other Ambulatory Visit: Payer: Self-pay | Admitting: Physician Assistant

## 2019-08-23 NOTE — Telephone Encounter (Signed)
Please schedule office visit with Dr. Gollan. Thank you! 

## 2019-08-23 NOTE — Telephone Encounter (Signed)
Attempted to schedule.  LMOV to call office.  ° °

## 2019-09-10 ENCOUNTER — Ambulatory Visit: Payer: Medicare Other | Admitting: Family

## 2019-09-10 ENCOUNTER — Encounter: Payer: Self-pay | Admitting: Nurse Practitioner

## 2019-09-10 ENCOUNTER — Ambulatory Visit (INDEPENDENT_AMBULATORY_CARE_PROVIDER_SITE_OTHER): Payer: Medicare Other | Admitting: Nurse Practitioner

## 2019-09-10 ENCOUNTER — Other Ambulatory Visit: Payer: Self-pay

## 2019-09-10 VITALS — BP 120/66 | HR 63 | Ht 77.0 in | Wt 249.5 lb

## 2019-09-10 DIAGNOSIS — I428 Other cardiomyopathies: Secondary | ICD-10-CM | POA: Diagnosis not present

## 2019-09-10 DIAGNOSIS — I480 Paroxysmal atrial fibrillation: Secondary | ICD-10-CM

## 2019-09-10 DIAGNOSIS — E782 Mixed hyperlipidemia: Secondary | ICD-10-CM | POA: Diagnosis not present

## 2019-09-10 DIAGNOSIS — I10 Essential (primary) hypertension: Secondary | ICD-10-CM | POA: Diagnosis not present

## 2019-09-10 DIAGNOSIS — N183 Chronic kidney disease, stage 3 unspecified: Secondary | ICD-10-CM

## 2019-09-10 DIAGNOSIS — I5022 Chronic systolic (congestive) heart failure: Secondary | ICD-10-CM | POA: Diagnosis not present

## 2019-09-10 MED ORDER — ATORVASTATIN CALCIUM 80 MG PO TABS: ORAL_TABLET | ORAL | 3 refills | Status: DC

## 2019-09-10 NOTE — Progress Notes (Signed)
Office Visit    Patient Name: Nathan [name]. Date of Encounter: 09/10/2019  Primary Care Provider:  [name], MD Primary Cardiologist:  Ida Rogue, MD  Chief Complaint    69 year old male with a history of atrial flutter status post ablation (April 2019), PAF (May 2019), nonischemic cardiomyopathy/HFrEF (EF 40-45%, April 2020), hypertension, PAH, noncompliance, stroke (April 2020), CKD II-III, and GI bleed (November 2019), who presents for follow-up related to heart failure.  Past Medical History    Past Medical History:  Diagnosis Date  . Atrial flutter ([place])    a. s/p TEE/DCCV 04/2017; b. 06/2017 s/p RFCA; c. CHADS2VASc => 5 (CHF, HTN, age x 1, CVA)-->noncompliant with Xarelto.  . Chronic combined systolic and diastolic CHF (congestive heart failure) ([place])    a. TTE 2/19: EF 20-25%, diffuse HK; b. TTE 3/19: EF 20-25%, diffuse HK; c. 08/2017 Echo: EF 35-40%, diff HK. Gr1 DD; d. 06/2018 Echo: EF 40-45%, DD. Neg bubble study.  . CKD (chronic kidney disease), stage II-III   . Essential hypertension   . GI bleed    a.  Hemorrhagic shock in 11/19 secondary to erosive gastropathy and Barrett's esophagus requiring multiple units PRBCs; b. Cleared to resume OAC->pt did not.  Marland Kitchen NICM (nonischemic cardiomyopathy) ([place])    a. 04/2017: Echo EF 20-25%; b. TTE 3/19: EF 20-25%; c. 07/2017 Cath: min irregs; d. 08/2017 Echo: EF 35-40%, diff HK; e. 06/2018 Echo: EF 40-45%, DD.  Marland Kitchen Noncompliance with medications   . PAF (paroxysmal atrial fibrillation) ([place])    a. 07/2017 afib->broke w/ IV amio->converted to oral bb due to prior intol to oral amio.  . Pulmonary hypertension ([place])   . Stroke [facility])    a. 06/2018 MRI/A Brain: R paramedian pons late acute/early subacute infarct, 54mm. No assoc hemorrhage or mass effect.  Sev chronic microvascular isch changtes and mod voluem loss. No large vessel occlusion, aneurysm, or significant stenosis.   Past Surgical History:  Procedure Laterality Date  .  A-FLUTTER ABLATION N/A 06/16/2017   Procedure: A-FLUTTER ABLATION;  Surgeon: [name], MD;  Location: [place] CV LAB;  Service: Cardiovascular;  Laterality: N/A;  . CARDIAC CATHETERIZATION    . CARDIOVERSION N/A 05/01/2017   Procedure: CARDIOVERSION;  Surgeon: [name], MD;  Location: [facility] ORS;  Service: Cardiovascular;  Laterality: N/A;  . CORONARY ANGIOPLASTY    . ESOPHAGOGASTRODUODENOSCOPY (EGD) WITH PROPOFOL N/A 02/09/2018   Procedure: ESOPHAGOGASTRODUODENOSCOPY (EGD) WITH PROPOFOL;  Surgeon: [name], MD;  Location: [facility] ENDOSCOPY;  Service: Endoscopy;  Laterality: N/A;  . RIGHT/LEFT HEART CATH AND CORONARY ANGIOGRAPHY N/A 07/18/2017   Procedure: RIGHT/LEFT HEART CATH AND CORONARY ANGIOGRAPHY;  Surgeon: [name], MD;  Location: [place] CV LAB;  Service: Cardiovascular;  Laterality: N/A;  . TEE WITHOUT CARDIOVERSION N/A 05/01/2017   Procedure: TRANSESOPHAGEAL ECHOCARDIOGRAM (TEE);  Surgeon: [name], MD;  Location: [facility] ORS;  Service: Cardiovascular;  Laterality: N/A;  . TEE WITHOUT CARDIOVERSION N/A 06/16/2017   Procedure: TRANSESOPHAGEAL ECHOCARDIOGRAM (TEE);  Surgeon: [name], MD;  Location: [facility] ENDOSCOPY;  Service: Cardiovascular;  Laterality: N/A;    Allergies  No Known Allergies  History of Present Illness    69 year old male with above complex past medical history including atrial flutter and atrial fibrillation, hypertension, noncompliance, nonischemic cardiomyopathy, HFrEF, stroke, CKD II-III, and GI bleed.  He was admitted to [facility] in February 2019 with new onset atrial flutter and CHF with an EF of 20-25%.  He was placed  on Xarelto underwent TEE and cardioversion.  Unfortunately, he cannot afford Xarelto.  He was remittent in March 2019 with recurrent atrial flutter.  Warfarin was initiated and he was subsequently transferred to [facility] for EP eval and TEE (EF 15-20%) and catheter ablation, which was successful,  though he was noted have intermittent atrial tachycardia post procedure and required amiodarone.  Warfarin was transitioned back to Xarelto prior to discharge.  Amio was discontinued due to intolerance (nausea and diarrhea).  He was readmitted May 2019 with recurrent atrial fibrillation which converted on IV amiodarone and he was maintained on oral beta-blocker.  Diagnostic catheterization was carried out due to ongoing LV dysfunction and this showed only minor irregularities.  He has since maintained sinus rhythm.  In November 2019, he required admission in the setting of hemorrhagic shock with a hemoglobin of 4.7 and massive GI bleed.  He required multiple units of packed red blood cells and was not able to undergo colonoscopy due to inability to tolerate bowel prep.  EGD showed erosive gastropathy and Barrett's esophagus.  He was subsequently cleared by GI to resume oral anticoagulation but never did.  In April 2020, he was admitted with sudden onset of left-sided weakness and facial droop consistent with stroke, which was seen on MRI/a.  He was placed back on Eliquis.  He was last seen in heart failure clinic in February of this year, at which time he was doing well.  His weight was up 16 pounds, which was attributed to overeating and deconditioning.  Since then, he notes that he has continued to do well.  He uses a cane to get around and says that he tries to stay active but is not routinely exercising.  He denies chest pain, dyspnea, palpitations, PND, orthopnea, dizziness, syncope, edema, or early satiety.  He notes good compliance with medications.  Home Medications    Prior to Admission medications   Medication Sig Start Date End Date Taking? Authorizing Provider  atorvastatin (LIPITOR) 80 MG tablet TAKE 1 TABLET BY MOUTH ONCE DAILY . APPOINTMENT REQUIRED FOR FUTURE REFILLS 08/23/19   Rise Mu, PA-C  ELIQUIS 5 MG TABS tablet Take 1 tablet by mouth twice daily 06/15/19   Rise Mu, PA-C    furosemide (LASIX) 40 MG tablet Take 1 tablet (40 mg total) by mouth 2 (two) times daily. 11/25/18   Theora Gianotti, NP  losartan (COZAAR) 100 MG tablet Take 1 tablet by mouth once daily 11/30/18   [name], MD  metoprolol succinate (TOPROL-XL) 50 MG 24 hr tablet TAKE 1 TABLET BY MOUTH ONCE DAILY. TAKE WITH OR IMMEDIATELY FOLLOWING A MEAL. 06/29/19   [name], MD  pantoprazole (PROTONIX) 40 MG tablet TAKE 1 TABLET BY MOUTH IN THE MORNING AT  6  AM 04/19/19   Dunn, Areta Haber, PA-C  spironolactone (ALDACTONE) 25 MG tablet Take 1 tablet by mouth once daily 11/03/18   [name], MD    Review of Systems    As above, doing well. He denies chest pain, palpitations, dyspnea, pnd, orthopnea, n, v, dizziness, syncope, edema, weight gain, or early satiety.  All other systems reviewed and are otherwise negative except as noted above.  Physical Exam    VS:  BP 120/66 (BP Location: Left Arm, Patient Position: Sitting, Cuff Size: Normal)   Pulse 63   Ht 6\' 5"  (1.956 m)   Wt 249 lb 8 oz (113.2 kg)   SpO2 97%   BMI 29.59 kg/m  ,  BMI Body mass index is 29.59 kg/m. GEN: Well nourished, well developed, in no acute distress. HEENT: normal. Neck: Supple, no JVD, carotid bruits, or masses. Cardiac: RRR, no murmurs, rubs, or gallops. No clubbing, cyanosis, edema.  Radials/DP/PT 2+ and equal bilaterally.  Respiratory:  Respirations regular and unlabored, clear to auscultation bilaterally. GI: Soft, nontender, nondistended, BS + x 4. MS: no deformity or atrophy. Skin: warm and dry, no rash. Neuro:  Strength and sensation are intact. Psych: Normal affect.  Accessory Clinical Findings    ECG personally reviewed by me today -regular sinus rhythm, 63 - no acute changes.  Lab Results  Component Value Date   WBC 5.4 03/25/2019   HGB 11.7 (L) 03/25/2019   HCT 35.4 (L) 03/25/2019   MCV 87.4 03/25/2019   PLT 262 03/25/2019   Lab Results  Component Value Date   CREATININE 1.88  (H) 03/25/2019   BUN 21 03/25/2019   NA 139 03/25/2019   K 5.3 (H) 03/25/2019   CL 104 03/25/2019   CO2 24 03/25/2019   Lab Results  Component Value Date   ALT 19 03/25/2019   AST 22 03/25/2019   ALKPHOS 71 03/25/2019   BILITOT 0.7 03/25/2019   Lab Results  Component Value Date   CHOL 161 03/25/2019   HDL 79 03/25/2019   LDLCALC 61 03/25/2019   TRIG 103 03/25/2019   CHOLHDL 2.0 03/25/2019    Lab Results  Component Value Date   HGBA1C 5.7 (H) 04/30/2017    Assessment & Plan    1.  Nonischemic cardiomyopathy/HFrEF: EF 40-45% by echo in April 2020.  Doing well over the past 6 months without dyspnea or edema.  He reports good compliance with medications and is euvolemic on examination today.  He remains on beta-blocker, ARB, spironolactone, and furosemide therapy.  I offered to check labs today however, he is due for labs in the next week or 2 with primary care and prefers to have those done at that time.  2.  Paroxysmal atrial flutter and fibrillation: Status post catheter ablation for atrial flutter.  Maintaining sinus rhythm on beta-blocker therapy.  He is anticoagulated with Eliquis.  CHA2DS2-VASc equals 5.  3.  Essential hypertension: Stable.  4.  Hyperlipidemia: LDL of 61 earlier this year with normal LFTs.  5.  Stage II-III chronic kidney disease: Creatinine was 1.88 in January.  He is due for follow-up labs with PCP in the next 2 weeks.  6.  Disposition: He has follow-up in heart failure clinic in August.  Follow-up with Dr. Rockey Situ in 6 months or sooner if necessary.   Murray Hodgkins, NP 09/10/2019, 9:47 AM

## 2019-09-10 NOTE — Patient Instructions (Signed)
Medication Instructions:  Your physician recommends that you continue on your current medications as directed. Please refer to the Current Medication list given to you today.  *If you need a refill on your cardiac medications before your next appointment, please call your pharmacy*   Lab Work: None ordered  If you have labs (blood work) drawn today and your tests are completely normal, you will receive your results only by: . MyChart Message (if you have MyChart) OR . A paper copy in the mail If you have any lab test that is abnormal or we need to change your treatment, we will call you to review the results.   Testing/Procedures: None ordered    Follow-Up: At [facility], you and your health needs are our priority.  As part of our continuing mission to provide you with exceptional heart care, we have created designated Provider Care Teams.  These Care Teams include your primary Cardiologist (physician) and Advanced Practice Providers (APPs -  Physician Assistants and Nurse Practitioners) who all work together to provide you with the care you need, when you need it.  We recommend signing up for the patient portal called "MyChart".  Sign up information is provided on this After Visit Summary.  MyChart is used to connect with patients for Virtual Visits (Telemedicine).  Patients are able to view lab/test results, encounter notes, upcoming appointments, etc.  Non-urgent messages can be sent to your provider as well.   To learn more about what you can do with MyChart, go to [email or web address].    Your next appointment:   6 month(s)  The format for your next appointment:   In Person  Provider:    You may see Timothy Gollan, MD or Christopher Berge, NP 

## 2019-09-30 ENCOUNTER — Ambulatory Visit: Payer: Medicare Other | Admitting: Internal Medicine

## 2019-10-18 ENCOUNTER — Other Ambulatory Visit: Payer: Self-pay

## 2019-10-18 ENCOUNTER — Encounter: Payer: Self-pay | Admitting: Family

## 2019-10-18 ENCOUNTER — Ambulatory Visit: Payer: Medicare Other | Attending: Family | Admitting: Family

## 2019-10-18 VITALS — BP 172/104 | HR 97 | Resp 18 | Ht 77.0 in | Wt 246.0 lb

## 2019-10-18 DIAGNOSIS — Z713 Dietary counseling and surveillance: Secondary | ICD-10-CM | POA: Diagnosis not present

## 2019-10-18 DIAGNOSIS — Z79899 Other long term (current) drug therapy: Secondary | ICD-10-CM | POA: Diagnosis not present

## 2019-10-18 DIAGNOSIS — I480 Paroxysmal atrial fibrillation: Secondary | ICD-10-CM | POA: Diagnosis not present

## 2019-10-18 DIAGNOSIS — I5022 Chronic systolic (congestive) heart failure: Secondary | ICD-10-CM | POA: Diagnosis not present

## 2019-10-18 DIAGNOSIS — I130 Hypertensive heart and chronic kidney disease with heart failure and stage 1 through stage 4 chronic kidney disease, or unspecified chronic kidney disease: Secondary | ICD-10-CM | POA: Insufficient documentation

## 2019-10-18 DIAGNOSIS — Z87891 Personal history of nicotine dependence: Secondary | ICD-10-CM | POA: Insufficient documentation

## 2019-10-18 DIAGNOSIS — Z7901 Long term (current) use of anticoagulants: Secondary | ICD-10-CM | POA: Diagnosis not present

## 2019-10-18 DIAGNOSIS — Z8673 Personal history of transient ischemic attack (TIA), and cerebral infarction without residual deficits: Secondary | ICD-10-CM | POA: Insufficient documentation

## 2019-10-18 DIAGNOSIS — I272 Pulmonary hypertension, unspecified: Secondary | ICD-10-CM | POA: Diagnosis not present

## 2019-10-18 DIAGNOSIS — Z8719 Personal history of other diseases of the digestive system: Secondary | ICD-10-CM | POA: Diagnosis not present

## 2019-10-18 DIAGNOSIS — N189 Chronic kidney disease, unspecified: Secondary | ICD-10-CM | POA: Insufficient documentation

## 2019-10-18 DIAGNOSIS — I10 Essential (primary) hypertension: Secondary | ICD-10-CM

## 2019-10-18 NOTE — Patient Instructions (Signed)
Continue weighing daily and call for an overnight weight gain of > 2 pounds or a weekly weight gain of >5 pounds. 

## 2019-10-18 NOTE — Progress Notes (Signed)
Patient ID: Nathan [name]., male    DOB: April 15, 1950, 69 y.o.   MRN: [id number]  HPI  Mr Luczak is a 69 y/o male with a history of HTN, stroke, previous tobacco use and chronic heart failure.   Echo report from 07/01/2018 reviewed and showed an EF of 40-45%. Echo report from 09/04/17 reviewed and showed an EF of 35-40%. Echo report from 06/16/17 reviewed and showed an EF of 15-20% along with mod-severe TR and an elevated PA pressure of 45 mm Hg.  Cardiac catheterization done 07/18/17 and showed heavily calcified coronary arteries with mild nonobstructive disease.  Left dominant system. Right heart catheterization showed moderately to severely elevated filling pressures, severe pulmonary hypertension and severely reduced cardiac output.   Has not been admitted or been in the ED in the last 6 months.    He presents today with a chief complaint of a follow-up visit. Currently doesn't have any symptoms and specifically denies any difficulty sleeping, dizziness, abdominal distention, palpitations, pedal edema, chest pain, shortness of breath, cough, fatigue or weight gain. Overall he says that he feels "great' and has become more active and is watching his diet more closely.   Waiting on medication delivery that is occurring today. Took his last dose of metoprolol and losartan last night and his last dose of eliquis this morning.   Past Medical History:  Diagnosis Date   Atrial flutter ([place])    a. s/p TEE/DCCV 04/2017; b. 06/2017 s/p RFCA; c. CHADS2VASc => 5 (CHF, HTN, age x 1, CVA)-->noncompliant with Xarelto.   Chronic combined systolic and diastolic CHF (congestive heart failure) ([place])    a. TTE 2/19: EF 20-25%, diffuse HK; b. TTE 3/19: EF 20-25%, diffuse HK; c. 08/2017 Echo: EF 35-40%, diff HK. Gr1 DD; d. 06/2018 Echo: EF 40-45%, DD. Neg bubble study.   CKD (chronic kidney disease), stage II-III    Essential hypertension    GI bleed    a.  Hemorrhagic shock in 11/19 secondary to erosive gastropathy  and Barrett's esophagus requiring multiple units PRBCs; b. Cleared to resume OAC->pt did not.   NICM (nonischemic cardiomyopathy) ([place])    a. 04/2017: Echo EF 20-25%; b. TTE 3/19: EF 20-25%; c. 07/2017 Cath: min irregs; d. 08/2017 Echo: EF 35-40%, diff HK; e. 06/2018 Echo: EF 40-45%, DD.   Noncompliance with medications    PAF (paroxysmal atrial fibrillation) ([place])    a. 07/2017 afib->broke w/ IV amio->converted to oral bb due to prior intol to oral amio.   Pulmonary hypertension ([place])    Stroke ([place])    a. 06/2018 MRI/A Brain: R paramedian pons late acute/early subacute infarct, 55mm. No assoc hemorrhage or mass effect.  Sev chronic microvascular isch changtes and mod voluem loss. No large vessel occlusion, aneurysm, or significant stenosis.   Past Surgical History:  Procedure Laterality Date   A-FLUTTER ABLATION N/A 06/16/2017   Procedure: A-FLUTTER ABLATION;  Surgeon: [name], MD;  Location: [place] [facility];  Service: Cardiovascular;  Laterality: N/A;   CARDIAC CATHETERIZATION     CARDIOVERSION N/A 05/01/2017   Procedure: CARDIOVERSION;  Surgeon: [name], MD;  Location: [facility] ORS;  Service: Cardiovascular;  Laterality: N/A;   CORONARY ANGIOPLASTY     ESOPHAGOGASTRODUODENOSCOPY (EGD) WITH PROPOFOL N/A 02/09/2018   Procedure: ESOPHAGOGASTRODUODENOSCOPY (EGD) WITH PROPOFOL;  Surgeon: [name], MD;  Location: [facility] ENDOSCOPY;  Service: Endoscopy;  Laterality: N/A;   RIGHT/LEFT HEART CATH AND CORONARY ANGIOGRAPHY N/A 07/18/2017   Procedure: RIGHT/LEFT HEART CATH AND  CORONARY ANGIOGRAPHY;  Surgeon: [name], MD;  Location: [place] [facility];  Service: Cardiovascular;  Laterality: N/A;   TEE WITHOUT CARDIOVERSION N/A 05/01/2017   Procedure: TRANSESOPHAGEAL ECHOCARDIOGRAM (TEE);  Surgeon: [name], MD;  Location: [facility] ORS;  Service: Cardiovascular;  Laterality: N/A;   TEE WITHOUT CARDIOVERSION N/A 06/16/2017   Procedure: TRANSESOPHAGEAL  ECHOCARDIOGRAM (TEE);  Surgeon: [name], MD;  Location: [facility] ENDOSCOPY;  Service: Cardiovascular;  Laterality: N/A;   Family History  Problem Relation Age of Onset   Alzheimer's disease Mother    Alzheimer's disease Father    Social History   Tobacco Use   Smoking status: Former Smoker    Quit date: 04/21/2017    Years since quitting: 2.4   Smokeless tobacco: Never Used  Substance Use Topics   Alcohol use: Yes    Alcohol/week: 3.0 standard drinks    Types: 3 Cans of beer per week    Comment: Drink a half of a 40oz beer every day, drank heavily in the past   No Known Allergies  Prior to Admission medications   Medication Sig Start Date End Date Taking? Authorizing Provider  atorvastatin (LIPITOR) 80 MG tablet TAKE 1 TABLET BY MOUTH ONCE DAILY 09/10/19  Yes Theora Gianotti, NP  ELIQUIS 5 MG TABS tablet Take 1 tablet by mouth twice daily 06/15/19  Yes Dunn, Areta Haber, PA-C  furosemide (LASIX) 40 MG tablet Take 1 tablet (40 mg total) by mouth 2 (two) times daily. 11/25/18  Yes Theora Gianotti, NP  pantoprazole (PROTONIX) 40 MG tablet TAKE 1 TABLET BY MOUTH IN THE MORNING AT  6  AM 04/19/19  Yes Dunn, Areta Haber, PA-C  spironolactone (ALDACTONE) 25 MG tablet Take 1 tablet by mouth once daily 11/03/18  Yes Gollan, Kathlene November, MD  losartan (COZAAR) 100 MG tablet Take 1 tablet by mouth once daily Patient not taking: Reported on 10/18/2019 11/30/18   [name], MD  metoprolol succinate (TOPROL-XL) 50 MG 24 hr tablet TAKE 1 TABLET BY MOUTH ONCE DAILY. TAKE WITH OR IMMEDIATELY FOLLOWING A MEAL. Patient not taking: Reported on 10/18/2019 06/29/19   [name], MD    Review of Systems  Constitutional: Negative for appetite change and fatigue.  HENT: Negative for congestion, postnasal drip and sore throat.   Eyes: Negative.   Respiratory: Negative for chest tightness and shortness of breath.   Cardiovascular: Negative for chest pain, palpitations and leg swelling.   Gastrointestinal: Negative for abdominal distention and abdominal pain.  Endocrine: Negative.   Genitourinary: Negative.   Musculoskeletal: Negative for back pain and neck pain.  Skin: Negative.   Allergic/Immunologic: Negative.   Neurological: Negative for dizziness and light-headedness.  Hematological: Negative for adenopathy. Does not bruise/bleed easily.  Psychiatric/Behavioral: Negative for dysphoric mood and sleep disturbance (sleeping on 2 pillows). The patient is not nervous/anxious.    Vitals:   10/18/19 1007  BP: (!) 172/104  Pulse: 97  Resp: 18  SpO2: 100%  Weight: (!) 246 lb (111.6 kg)  Height: 6\' 5"  (1.956 m)   Wt Readings from Last 3 Encounters:  10/18/19 (!) 246 lb (111.6 kg)  09/10/19 249 lb 8 oz (113.2 kg)  04/19/19 252 lb 6.4 oz (114.5 kg)   Lab Results  Component Value Date   CREATININE 1.88 (H) 03/25/2019   CREATININE 1.31 (H) 11/25/2018   CREATININE 1.49 (H) 07/07/2018     Physical Exam Vitals and nursing note reviewed.  Constitutional:  Appearance: He is well-developed.  HENT:     Head: Normocephalic and atraumatic.  Neck:     Vascular: No JVD.  Cardiovascular:     Rate and Rhythm: Normal rate and regular rhythm.  Pulmonary:     Effort: Pulmonary effort is normal. No respiratory distress.     Breath sounds: No wheezing or rales.  Abdominal:     General: There is no distension.     Palpations: Abdomen is soft.     Tenderness: There is no abdominal tenderness.  Musculoskeletal:        General: No tenderness.     Cervical back: Normal range of motion and neck supple.  Skin:    General: Skin is warm and dry.  Neurological:     Mental Status: He is alert and oriented to person, place, and time.  Psychiatric:        Behavior: Behavior normal.        Thought Content: Thought content normal.     Assessment & Plan:  1: Chronic heart failure with mildly reduced ejection fraction- - NYHA class I - euvolemic today - weighing daily;  Reviewed the importance of calling for any overnight weight gain of >2 pounds or a weekly weight gain of >5 pounds - weight down 6 pounds from last visit here 6 months ago  - says that he's watching his diet more closely and has become more active; getting out of the house more as he now has a scooter so isn't staying home "eating all the time" - not adding salt to his food and tries to eat low sodium foods. Reminded to closely follow a 2000mg  sodium diet  - saw cardiology Sharolyn Douglas) 09/10/19 - BNP on 07/15/17 was 4283.0  2: HTN- - BP & HR elevated today but he took his last dose of metoprolol/ losartan last night and is waiting on delivery today of those medications - seeing PCP (Tejan-Sie) 11/04/19 - BMP from 03/25/19 reviewed and showed sodium 139, potassium 5.3, creatinine 1.88 and GFR 42  3: Atrial fibrillation- - took last dose of eliquis this morning and is getting medication delivered today   Medication bottles were reviewed.  Return in 6 months or sooner for any questions/problems before then.

## 2019-11-17 ENCOUNTER — Ambulatory Visit: Payer: Medicare Other | Admitting: Urology

## 2019-11-17 ENCOUNTER — Encounter: Payer: Self-pay | Admitting: Urology

## 2019-12-08 ENCOUNTER — Telehealth: Payer: Self-pay | Admitting: Cardiovascular Disease

## 2019-12-08 MED ORDER — PANTOPRAZOLE SODIUM 40 MG PO TBEC: DELAYED_RELEASE_TABLET | ORAL | 3 refills | Status: DC

## 2019-12-08 MED ORDER — SPIRONOLACTONE 25 MG PO TABS: 25.0000 mg | ORAL_TABLET | Freq: Every day | ORAL | 3 refills | Status: DC

## 2019-12-08 NOTE — Telephone Encounter (Signed)
Patient is returning your call.  

## 2019-12-08 NOTE — Telephone Encounter (Signed)
Spoke with patient and he has been out of his medication spironolactone for some time. Reviewed all medications and doses. Advised that I would send in refills and confirmed his appointment for next week with Dr. [name]. He verbalized understanding and will pick up medications as well. Confirmed pharmacy and sent refills in for the 2 we had ordered for him. He verbalized understanding to get started on those and will see Dr. [name] next week and Dr. Rockey Situ next month. He had no further questions at this time.

## 2019-12-08 NOTE — Telephone Encounter (Signed)
Pt c/o swelling: STAT is pt has developed SOB within 24 hours  1) How much weight have you gained and in what time span? No weight gain that he has noticed   2) If swelling, where is the swelling located? Swelling in leg on left   3) Are you currently taking a fluid pill? no  4) Are you currently SOB? no  5) Do you have a log of your daily weights (if so, list)? No log  6) Have you gained 3 pounds in a day or 5 pounds in a week?   7) Have you traveled recently?

## 2019-12-08 NOTE — Telephone Encounter (Signed)
Left voicemail message to call back  

## 2019-12-16 ENCOUNTER — Encounter: Payer: Self-pay | Admitting: Internal Medicine

## 2019-12-16 ENCOUNTER — Ambulatory Visit (INDEPENDENT_AMBULATORY_CARE_PROVIDER_SITE_OTHER): Payer: Medicare Other | Admitting: Internal Medicine

## 2019-12-16 ENCOUNTER — Other Ambulatory Visit: Payer: Self-pay

## 2019-12-16 VITALS — BP 122/71 | HR 68 | Ht 77.0 in | Wt 238.0 lb

## 2019-12-16 DIAGNOSIS — I480 Paroxysmal atrial fibrillation: Secondary | ICD-10-CM | POA: Diagnosis not present

## 2019-12-16 DIAGNOSIS — E782 Mixed hyperlipidemia: Secondary | ICD-10-CM

## 2019-12-16 DIAGNOSIS — I428 Other cardiomyopathies: Secondary | ICD-10-CM

## 2019-12-16 DIAGNOSIS — I10 Essential (primary) hypertension: Secondary | ICD-10-CM | POA: Diagnosis not present

## 2019-12-16 DIAGNOSIS — I5022 Chronic systolic (congestive) heart failure: Secondary | ICD-10-CM | POA: Diagnosis not present

## 2019-12-16 MED ORDER — FUROSEMIDE 80 MG PO TABS: 80.0000 mg | ORAL_TABLET | Freq: Every day | ORAL | 1 refills | Status: DC

## 2019-12-16 NOTE — Progress Notes (Signed)
Patient Care Team: [name], MD as PCP - General (Internal Medicine) [name], MD as PCP - Cardiology (Cardiology)   HPI  Nathan [name]. is a 69 y.o. male Seen in follow-up for ablation for atrial flutter.  This was identified 3/19 when he presented with a rapid rate and congestive heart failure.  He was found to have an ejection fraction of 20-25%.  He underwent catheter ablation 4/19 (GT) and was discharged in sinus rhythm.      Interval atrial fibrillation and developed a stroke 4/20 currently anticoagulated with Eliquis.  No bleeding.  Has noted however, over recent weeks increasing weakness of his left hand with some pain in the forearm and difficulty with pronation and supination.     Date Cr K Mg Hgb LDL  3//19 1.67 3.4 1.6    4/19  1.26 3.2 1.6    8/21 2.7 4.7  11.6 64   DATE TEST EF   3/19 Echo   20-25 %   5/19 LHC   Cors, calcified but without signif obstruction  4/20 Echo  40-45%      Records and Results Reviewed   Past Medical History:  Diagnosis Date  . Atrial flutter ([place])    a. s/p TEE/DCCV 04/2017; b. 06/2017 s/p RFCA; c. CHADS2VASc => 5 (CHF, HTN, age x 1, CVA)-->noncompliant with Xarelto.  . Chronic combined systolic and diastolic CHF (congestive heart failure) ([place])    a. TTE 2/19: EF 20-25%, diffuse HK; b. TTE 3/19: EF 20-25%, diffuse HK; c. 08/2017 Echo: EF 35-40%, diff HK. Gr1 DD; d. 06/2018 Echo: EF 40-45%, DD. Neg bubble study.  . CKD (chronic kidney disease), stage II-III   . Essential hypertension   . GI bleed    a.  Hemorrhagic shock in 11/19 secondary to erosive gastropathy and Barrett's esophagus requiring multiple units PRBCs; b. Cleared to resume OAC->pt did not.  Marland Kitchen NICM (nonischemic cardiomyopathy) ([place])    a. 04/2017: Echo EF 20-25%; b. TTE 3/19: EF 20-25%; c. 07/2017 Cath: min irregs; d. 08/2017 Echo: EF 35-40%, diff HK; e. 06/2018 Echo: EF 40-45%, DD.  Marland Kitchen Noncompliance with medications   . PAF (paroxysmal atrial  fibrillation) ([place])    a. 07/2017 afib->broke w/ IV amio->converted to oral bb due to prior intol to oral amio.  . Pulmonary hypertension ([place])   . Stroke [facility])    a. 06/2018 MRI/A Brain: R paramedian pons late acute/early subacute infarct, 24mm. No assoc hemorrhage or mass effect.  Sev chronic microvascular isch changtes and mod voluem loss. No large vessel occlusion, aneurysm, or significant stenosis.    Past Surgical History:  Procedure Laterality Date  . A-FLUTTER ABLATION N/A 06/16/2017   Procedure: A-FLUTTER ABLATION;  Surgeon: [name], MD;  Location: [place] [facility];  Service: Cardiovascular;  Laterality: N/A;  . CARDIAC CATHETERIZATION    . CARDIOVERSION N/A 05/01/2017   Procedure: CARDIOVERSION;  Surgeon: [name], MD;  Location: [facility] ORS;  Service: Cardiovascular;  Laterality: N/A;  . CORONARY ANGIOPLASTY    . ESOPHAGOGASTRODUODENOSCOPY (EGD) WITH PROPOFOL N/A 02/09/2018   Procedure: ESOPHAGOGASTRODUODENOSCOPY (EGD) WITH PROPOFOL;  Surgeon: [name], MD;  Location: [facility] ENDOSCOPY;  Service: Endoscopy;  Laterality: N/A;  . RIGHT/LEFT HEART CATH AND CORONARY ANGIOGRAPHY N/A 07/18/2017   Procedure: RIGHT/LEFT HEART CATH AND CORONARY ANGIOGRAPHY;  Surgeon: [name], MD;  Location: [place] [facility];  Service: Cardiovascular;  Laterality: N/A;  . TEE WITHOUT CARDIOVERSION N/A 05/01/2017  Procedure: TRANSESOPHAGEAL ECHOCARDIOGRAM (TEE);  Surgeon: [name], MD;  Location: [facility] ORS;  Service: Cardiovascular;  Laterality: N/A;  . TEE WITHOUT CARDIOVERSION N/A 06/16/2017   Procedure: TRANSESOPHAGEAL ECHOCARDIOGRAM (TEE);  Surgeon: [name], MD;  Location: [facility] ENDOSCOPY;  Service: Cardiovascular;  Laterality: N/A;    Current Meds  Medication Sig  . atorvastatin (LIPITOR) 80 MG tablet TAKE 1 TABLET BY MOUTH ONCE DAILY  . ELIQUIS 5 MG TABS tablet Take 1 tablet by mouth twice daily  . furosemide (LASIX) 40 MG tablet Take 1 tablet (40 mg  total) by mouth 2 (two) times daily.  Marland Kitchen losartan (COZAAR) 100 MG tablet Take 1 tablet by mouth once daily  . metoprolol succinate (TOPROL-XL) 50 MG 24 hr tablet TAKE 1 TABLET BY MOUTH ONCE DAILY. TAKE WITH OR IMMEDIATELY FOLLOWING A MEAL.  . pantoprazole (PROTONIX) 40 MG tablet TAKE 1 TABLET BY MOUTH IN THE MORNING AT  6  AM  . spironolactone (ALDACTONE) 25 MG tablet Take 1 tablet (25 mg total) by mouth daily.    No Known Allergies    Review of Systems negative except from HPI and PMH  Physical Exam BP 122/71   Pulse 68   Ht 6\' 5"  (1.956 m)   Wt 238 lb (108 kg)   SpO2 96%   BMI 28.22 kg/m  Well developed and nourished in no acute distress HENT normal Neck supple with JVP-  Flat  Clear Regular rate and rhythm, no murmurs or gallops Abd-soft with active BS No Clubbing cyanosis left greater than right 2+ edema Skin-warm and dry A & Oriented left hand weakness.  Difficulty with pronation and supination.  ECG sinus at 68 Intervals 01/[birth year]/1940  Assessment and  Plan  Atrial fib persistent  NICM  CHF chronic systolic/diastolic  Renal insuff Gr 3  Stroke with acute left hand weakness  Concern regarding his worsening left hand weakness.  Have asked him to follow-up with his primary care physician for further neurological evaluation.  Not sure whether there has been a 2nd stroke.  No headache to suggest intracranial bleeding.  Patient is modestly volume overloaded.  We will increase his furosemide from 40 twice daily--80 daily, maintain the same total dose  Creatinine clearance was calculated at 46 not withstanding the significant interval increase in his creatinine.  Hence, we will continue him on Aldactone  No arrhythmias of which he is aware.  On Eliquis without overt bleeding; hemoglobin great for him.  (In the past 9.1--10.7)   Current medicines are reviewed at length with the patient today .  The patient does not  have concerns regarding medicines.

## 2019-12-16 NOTE — Patient Instructions (Signed)
Medication Instructions:  - Your physician has recommended you make the following change in your medication:   1) INCREASE lasix (furosemide) to 80 mg- take 1 tablet by mouth once daily   *If you need a refill on your cardiac medications before your next appointment, please call your pharmacy*   Lab Work: - none ordered  If you have labs (blood work) drawn today and your tests are completely normal, you will receive your results only by: Marland Kitchen MyChart Message (if you have MyChart) OR . A paper copy in the mail If you have any lab test that is abnormal or we need to change your treatment, we will call you to review the results.   Testing/Procedures: -none ordered   Follow-Up: At [facility], you and your health needs are our priority.  As part of our continuing mission to provide you with exceptional heart care, we have created designated Provider Care Teams.  These Care Teams include your primary Cardiologist (physician) and Advanced Practice Providers (APPs -  Physician Assistants and Nurse Practitioners) who all work together to provide you with the care you need, when you need it.  We recommend signing up for the patient portal called "MyChart".  Sign up information is provided on this After Visit Summary.  MyChart is used to connect with patients for Virtual Visits (Telemedicine).  Patients are able to view lab/test results, encounter notes, upcoming appointments, etc.  Non-urgent messages can be sent to your provider as well.   To learn more about what you can do with MyChart, go to [email or web address].    Your next appointment:   As needed   The format for your next appointment:   as needed  Provider:   Virl Axe, MD   Other Instructions

## 2020-01-04 ENCOUNTER — Ambulatory Visit: Payer: Medicare Other | Admitting: Cardiovascular Disease

## 2020-01-12 NOTE — Progress Notes (Signed)
., Cardiology Office Note  Date:  01/14/2020   ID:  Nathan [name]., DOB May 25, 1950, MRN [id number]  PCP:  [name], MD   Chief Complaint  Patient presents with   other    6 month follow up. Meds reviewed by the pt. verbally. "doing well."     HPI:  [name] quit 04/28/17 Admission to the hospital 04/2017 with new onset atrial flutter with RVR,  elevated troponin,  acutecombinedsystolic and diastolic CHF Ejection fraction 20-25% February 13th 2019 S/p TEE and cardioversion 05/01/2017 Back into atrial flutter, ejection fraction 20-25% Flutter ablation 06/16/2017 Was in atrial fibrillation on office visit with Dr. [name] July 05 2017 Interval atrial fibrillation and developed a stroke 4/20 currently anticoagulated with Eliquis. Echo 06/2018: ef 40 to 45% Who presents for follow up of his cardiomyopathy, atrial flutter after cardioversion  CVA 06/2018: left side, left hand back 80%, not as strong  Left leg weak, unsteady gait Doing well  Weight stable No palpitations, no arrhythmia No edema  Has labs through PMD next month Not smoking No regular exercise Uses a cane, gait instability  On prior office visit with EP Lasix 40 up to 80 mg for left lower extremity edema  Labs requested from primary care and reviewed on today's visit CR up to 2.7 on higher dose lasix 80 daily Was 1.88 (close to his baseline)  EKG personally reviewed by myself on todays visit Shows normal sinus rhythm rate 65 bpm short PR no significant ST-T wave changes  Other past medical history reviewed cardiac catheterization 07/18/2017 showing nonobstructive disease Heavy calcification markedly elevated left ventricular end-diastolic pressures Insisted on leaving the hospital 07/19/2017  Discharged in normal sinus rhythm Lasix up to 40 twice a day with potassium 20 twice a day   atrial flutter with RVR with 2:1 AV block with heart rates in the 120s  bpm Despite advnacing meds He was refusing  metoprolol  -Echo showed EF 20-25% (done while tachycardic), left atrium 54 mm -CHADS2VASC at least least 2 (CHF, age x 1) converted with ablation  Elevated pressures obtained through echocardiogram, TEE D/c  on Lasix 40 daily   PMH:   has a past medical history of Atrial flutter ([name]), Chronic combined systolic and diastolic CHF (congestive heart failure) ([name]), CKD (chronic kidney disease), stage II-III, Essential hypertension, GI bleed, NICM (nonischemic cardiomyopathy) ([name]), Noncompliance with medications, PAF (paroxysmal atrial fibrillation) ([name]), Pulmonary hypertension ([name]), and Stroke ([name]).  PSH:    Past Surgical History:  Procedure Laterality Date   A-FLUTTER ABLATION N/A 06/16/2017   Procedure: A-FLUTTER ABLATION;  Surgeon: [name], MD;  Location: [place] [facility];  Service: Cardiovascular;  Laterality: N/A;   CARDIAC CATHETERIZATION     CARDIOVERSION N/A 05/01/2017   Procedure: CARDIOVERSION;  Surgeon: [name], MD;  Location: [facility] ORS;  Service: Cardiovascular;  Laterality: N/A;   CORONARY ANGIOPLASTY     ESOPHAGOGASTRODUODENOSCOPY (EGD) WITH PROPOFOL N/A 02/09/2018   Procedure: ESOPHAGOGASTRODUODENOSCOPY (EGD) WITH PROPOFOL;  Surgeon: [name], MD;  Location: [facility] ENDOSCOPY;  Service: Endoscopy;  Laterality: N/A;   RIGHT/LEFT HEART CATH AND CORONARY ANGIOGRAPHY N/A 07/18/2017   Procedure: RIGHT/LEFT HEART CATH AND CORONARY ANGIOGRAPHY;  Surgeon: [name], MD;  Location: [place] [facility];  Service: Cardiovascular;  Laterality: N/A;   TEE WITHOUT CARDIOVERSION N/A 05/01/2017   Procedure: TRANSESOPHAGEAL ECHOCARDIOGRAM (TEE);  Surgeon: [name], MD;  Location: [facility] ORS;  Service: Cardiovascular;  Laterality:  N/A;   TEE WITHOUT CARDIOVERSION N/A 06/16/2017   Procedure: TRANSESOPHAGEAL ECHOCARDIOGRAM (TEE);  Surgeon: [name], MD;  Location: [facility] ENDOSCOPY;  Service:  Cardiovascular;  Laterality: N/A;    Current Outpatient Medications  Medication Sig Dispense Refill   atorvastatin (LIPITOR) 80 MG tablet TAKE 1 TABLET BY MOUTH ONCE DAILY 90 tablet 3   ELIQUIS 5 MG TABS tablet Take 1 tablet by mouth twice daily 360 tablet 0   furosemide (LASIX) 80 MG tablet Take 1 tablet (80 mg total) by mouth daily. 90 tablet 1   metoprolol succinate (TOPROL-XL) 50 MG 24 hr tablet TAKE 1 TABLET BY MOUTH ONCE DAILY. TAKE WITH OR IMMEDIATELY FOLLOWING A MEAL. 30 tablet 0   pantoprazole (PROTONIX) 40 MG tablet TAKE 1 TABLET BY MOUTH IN THE MORNING AT  6  AM 90 tablet 3   spironolactone (ALDACTONE) 25 MG tablet Take 1 tablet (25 mg total) by mouth daily. 90 tablet 3   losartan (COZAAR) 100 MG tablet Take 1 tablet by mouth once daily 90 tablet 0   No current facility-administered medications for this visit.     Allergies:   Patient has no known allergies.   Social History:  The patient  reports that he quit smoking about 2 years ago. He has never used smokeless tobacco. He reports current alcohol use of about 3.0 standard drinks of alcohol per week. He reports that he does not use drugs.   Family History:   family history includes Alzheimer's disease in his father and mother.    Review of Systems: Review of Systems  Constitutional: Negative.   Respiratory: Negative.   Cardiovascular: Negative.   Gastrointestinal: Negative.   Musculoskeletal: Negative.   Neurological: Negative.   Psychiatric/Behavioral: Negative.   All other systems reviewed and are negative.    PHYSICAL EXAM: VS:  BP 130/80 (BP Location: Left Arm, Patient Position: Sitting, Cuff Size: Normal)    Pulse 65    Ht 6\' 5"  (1.956 m)    Wt 239 lb (108.4 kg)    SpO2 98%    BMI 28.34 kg/m  , BMI Body mass index is 28.34 kg/m.  Constitutional:  oriented to person, place, and time. No distress.  Walked for me in the office, took a little bit of time to stand up, used a cane, left leg weakness, left  hand weakness HENT:  Head: Grossly normal Eyes:  no discharge. No scleral icterus.  Neck: No JVD, no carotid bruits  Cardiovascular: Regular rate and rhythm, no murmurs appreciated Pulmonary/Chest: Clear to auscultation bilaterally, no wheezes or rails Abdominal: Soft.  no distension.  no tenderness.  Musculoskeletal: Normal range of motion, weakness on the left as above Neurological:  normal muscle tone. Coordination normal. No atrophy Skin: Skin warm and dry Psychiatric: normal affect, pleasant   Recent Labs: 03/25/2019: ALT 19; BUN 21; Creatinine, Ser 1.88; Hemoglobin 11.7; Platelets 262; Potassium 5.3; Sodium 139    Lipid Panel Lab Results  Component Value Date   CHOL 161 03/25/2019   HDL 79 03/25/2019   LDLCALC 61 03/25/2019   TRIG 103 03/25/2019      Wt Readings from Last 3 Encounters:  01/14/20 239 lb (108.4 kg)  12/16/19 238 lb (108 kg)  10/18/19 (!) 246 lb (111.6 kg)     ASSESSMENT AND PLAN:  Atrial flutter with rapid ventricular response (HCC)  Prior ablation Given stroke, continue on Eliquis Continue metoprolol  History of stroke Left side hand weakness, left leg weakness Sedentary, likely explaining  some of the chronic swelling left leg  dilated cardiomyopathy /tachycardia mediated Recommend he go back on Lasix 40 daily given dramatic climbing his creatinine on Lasix 80 with creatinine 1.8 up to 2.7 Numbers discussed with him in detail Recommended compression hose for left lower extremity, more movement of that left leg -Minute ejection fraction 40 to 45% on echo April 2020  Pulmonary HTN ([place]) - Plan: EKG 12-Lead Worsening renal function, Lasix back to 40 daily, as repeat lab work with primary care in 1 month He denies any leg swelling abdominal distention shortness of breath  Acute on chronic combined systolic and diastolic CHF (congestive heart failure) ([place]) - Plan: EKG 12-Lead Improved EF 1 year ago  Atrial fibrillation Maintaining normal  sinus rhythm Intolerant of amiodarone Denies any tachyarrhythmia, followed by EP   Total encounter time more than 25 minutes  Greater than 50% was spent in counseling and coordination of care with the patient    Orders Placed This Encounter  Procedures   EKG 12-Lead     Signed, Esmond Plants, M.D., Ph.D. 01/14/2020  [place], [place] [phone]

## 2020-01-13 ENCOUNTER — Other Ambulatory Visit: Payer: Self-pay | Admitting: Cardiovascular Disease

## 2020-01-14 ENCOUNTER — Ambulatory Visit (INDEPENDENT_AMBULATORY_CARE_PROVIDER_SITE_OTHER): Payer: Medicare Other | Admitting: Cardiovascular Disease

## 2020-01-14 ENCOUNTER — Other Ambulatory Visit: Payer: Self-pay

## 2020-01-14 ENCOUNTER — Encounter: Payer: Self-pay | Admitting: Cardiovascular Disease

## 2020-01-14 VITALS — BP 130/80 | HR 65 | Ht 77.0 in | Wt 239.0 lb

## 2020-01-14 DIAGNOSIS — Z8673 Personal history of transient ischemic attack (TIA), and cerebral infarction without residual deficits: Secondary | ICD-10-CM

## 2020-01-14 DIAGNOSIS — I428 Other cardiomyopathies: Secondary | ICD-10-CM

## 2020-01-14 DIAGNOSIS — I480 Paroxysmal atrial fibrillation: Secondary | ICD-10-CM

## 2020-01-14 DIAGNOSIS — N183 Chronic kidney disease, stage 3 unspecified: Secondary | ICD-10-CM

## 2020-01-14 DIAGNOSIS — E782 Mixed hyperlipidemia: Secondary | ICD-10-CM

## 2020-01-14 DIAGNOSIS — I10 Essential (primary) hypertension: Secondary | ICD-10-CM | POA: Diagnosis not present

## 2020-01-14 DIAGNOSIS — I5022 Chronic systolic (congestive) heart failure: Secondary | ICD-10-CM

## 2020-01-14 MED ORDER — FUROSEMIDE 40 MG PO TABS: 40.0000 mg | ORAL_TABLET | Freq: Every day | ORAL | 3 refills | Status: DC

## 2020-01-14 NOTE — Patient Instructions (Addendum)
Wear a compression sock left leg, Leg elevation when sitting    Medication Instructions:  Lasix back to 40 mg daily Repeats labs with primary care next month  If you need a refill on your cardiac medications before your next appointment, please call your pharmacy.    Lab work: No new labs needed   If you have labs (blood work) drawn today and your tests are completely normal, you will receive your results only by: Marland Kitchen MyChart Message (if you have MyChart) OR . A paper copy in the mail If you have any lab test that is abnormal or we need to change your treatment, we will call you to review the results.   Testing/Procedures: No new testing needed   Follow-Up: At [facility], you and your health needs are our priority.  As part of our continuing mission to provide you with exceptional heart care, we have created designated Provider Care Teams.  These Care Teams include your primary Cardiologist (physician) and Advanced Practice Providers (APPs -  Physician Assistants and Nurse Practitioners) who all work together to provide you with the care you need, when you need it.  . You will need a follow up appointment in 12 months  . Providers on your designated Care Team:   . Murray Hodgkins, NP . Christell Faith, PA-C . Marrianne Mood, PA-C  Any Other Special Instructions Will Be Listed Below (If Applicable).  COVID-19 Vaccine Information can be found at: ShippingScam.co.uk For questions related to vaccine distribution or appointments, please email [email or web address] or call [phone].

## 2020-02-28 ENCOUNTER — Other Ambulatory Visit: Payer: Self-pay | Admitting: Physician Assistant

## 2020-02-28 NOTE — Telephone Encounter (Signed)
Refill request

## 2020-02-28 NOTE — Telephone Encounter (Signed)
Pt's age 69, wt 108.4 kg, SCr 2.7, CrCl 40.15, last ov w/ TG 01/14/20.

## 2020-03-13 ENCOUNTER — Ambulatory Visit: Payer: Medicare Other | Admitting: Cardiovascular Disease

## 2020-04-04 ENCOUNTER — Observation Stay
Admission: EM | Admit: 2020-04-04 | Discharge: 2020-04-08 | Disposition: A | Discharge status: Home / Self Care | Payer: Medicare Other | Attending: Student | Admitting: Student

## 2020-04-04 ENCOUNTER — Emergency Department: Payer: Medicare Other

## 2020-04-04 ENCOUNTER — Other Ambulatory Visit: Payer: Self-pay

## 2020-04-04 DIAGNOSIS — R531 Weakness: Secondary | ICD-10-CM | POA: Diagnosis present

## 2020-04-04 DIAGNOSIS — R2681 Unsteadiness on feet: Secondary | ICD-10-CM | POA: Diagnosis not present

## 2020-04-04 DIAGNOSIS — Z720 Tobacco use: Secondary | ICD-10-CM | POA: Diagnosis present

## 2020-04-04 DIAGNOSIS — Z7901 Long term (current) use of anticoagulants: Secondary | ICD-10-CM | POA: Insufficient documentation

## 2020-04-04 DIAGNOSIS — I482 Chronic atrial fibrillation, unspecified: Secondary | ICD-10-CM | POA: Diagnosis present

## 2020-04-04 DIAGNOSIS — N183 Chronic kidney disease, stage 3 unspecified: Secondary | ICD-10-CM | POA: Diagnosis present

## 2020-04-04 DIAGNOSIS — E876 Hypokalemia: Secondary | ICD-10-CM | POA: Diagnosis present

## 2020-04-04 DIAGNOSIS — I5022 Chronic systolic (congestive) heart failure: Secondary | ICD-10-CM | POA: Diagnosis present

## 2020-04-04 DIAGNOSIS — D649 Anemia, unspecified: Secondary | ICD-10-CM | POA: Diagnosis not present

## 2020-04-04 DIAGNOSIS — K635 Polyp of colon: Secondary | ICD-10-CM | POA: Insufficient documentation

## 2020-04-04 DIAGNOSIS — K219 Gastro-esophageal reflux disease without esophagitis: Secondary | ICD-10-CM | POA: Diagnosis present

## 2020-04-04 DIAGNOSIS — Z20822 Contact with and (suspected) exposure to covid-19: Secondary | ICD-10-CM | POA: Insufficient documentation

## 2020-04-04 DIAGNOSIS — I10 Essential (primary) hypertension: Secondary | ICD-10-CM | POA: Diagnosis present

## 2020-04-04 DIAGNOSIS — I130 Hypertensive heart and chronic kidney disease with heart failure and stage 1 through stage 4 chronic kidney disease, or unspecified chronic kidney disease: Secondary | ICD-10-CM | POA: Insufficient documentation

## 2020-04-04 DIAGNOSIS — E559 Vitamin D deficiency, unspecified: Secondary | ICD-10-CM | POA: Diagnosis not present

## 2020-04-04 DIAGNOSIS — N1831 Chronic kidney disease, stage 3a: Secondary | ICD-10-CM | POA: Diagnosis not present

## 2020-04-04 DIAGNOSIS — D509 Iron deficiency anemia, unspecified: Secondary | ICD-10-CM | POA: Diagnosis not present

## 2020-04-04 DIAGNOSIS — Z79899 Other long term (current) drug therapy: Secondary | ICD-10-CM | POA: Diagnosis not present

## 2020-04-04 DIAGNOSIS — F17200 Nicotine dependence, unspecified, uncomplicated: Secondary | ICD-10-CM | POA: Insufficient documentation

## 2020-04-04 DIAGNOSIS — I639 Cerebral infarction, unspecified: Secondary | ICD-10-CM | POA: Diagnosis present

## 2020-04-04 DIAGNOSIS — I480 Paroxysmal atrial fibrillation: Secondary | ICD-10-CM | POA: Diagnosis not present

## 2020-04-04 DIAGNOSIS — E785 Hyperlipidemia, unspecified: Secondary | ICD-10-CM | POA: Diagnosis present

## 2020-04-04 LAB — MAGNESIUM: Magnesium: 1.9 mg/dL (ref 1.7–2.4)

## 2020-04-04 LAB — HEMOGLOBIN AND HEMATOCRIT, BLOOD
HCT: 25.6 % — ABNORMAL LOW (ref 39.0–52.0)
Hemoglobin: 8.2 g/dL — ABNORMAL LOW (ref 13.0–17.0)

## 2020-04-04 LAB — COMPREHENSIVE METABOLIC PANEL
ALT: 16 U/L (ref 0–44)
AST: 30 U/L (ref 15–41)
Albumin: 2.9 g/dL — ABNORMAL LOW (ref 3.5–5.0)
Alkaline Phosphatase: 68 U/L (ref 38–126)
Anion gap: 15 (ref 5–15)
BUN: 32 mg/dL — ABNORMAL HIGH (ref 8–23)
CO2: 23 mmol/L (ref 22–32)
Calcium: 9.2 mg/dL (ref 8.9–10.3)
Chloride: 100 mmol/L (ref 98–111)
Creatinine, Ser: 1.43 mg/dL — ABNORMAL HIGH (ref 0.61–1.24)
GFR, Estimated: 53 mL/min — ABNORMAL LOW (ref >60)
Glucose, Bld: 106 mg/dL — ABNORMAL HIGH (ref 70–99)
Potassium: 3.4 mmol/L — ABNORMAL LOW (ref 3.5–5.1)
Sodium: 138 mmol/L (ref 135–145)
Total Bilirubin: 0.7 mg/dL (ref 0.3–1.2)
Total Protein: 8.4 g/dL — ABNORMAL HIGH (ref 6.5–8.1)

## 2020-04-04 LAB — CBC
HCT: 23.4 % — ABNORMAL LOW (ref 39.0–52.0)
Hemoglobin: 7.4 g/dL — ABNORMAL LOW (ref 13.0–17.0)
MCH: 25 pg — ABNORMAL LOW (ref 26.0–34.0)
MCHC: 31.6 g/dL (ref 30.0–36.0)
MCV: 79.1 fL — ABNORMAL LOW (ref 80.0–100.0)
Platelets: 381 10*3/uL (ref 150–400)
RBC: 2.96 MIL/uL — ABNORMAL LOW (ref 4.22–5.81)
RDW: 16.4 % — ABNORMAL HIGH (ref 11.5–15.5)
WBC: 7.8 10*3/uL (ref 4.0–10.5)
nRBC: 0 % (ref 0.0–0.2)

## 2020-04-04 LAB — PROTIME-INR
INR: 1.5 — ABNORMAL HIGH (ref 0.8–1.2)
Prothrombin Time: 17.3 seconds — ABNORMAL HIGH (ref 11.4–15.2)

## 2020-04-04 LAB — URINALYSIS, COMPLETE (UACMP) WITH MICROSCOPIC
Bilirubin Urine: NEGATIVE
Glucose, UA: NEGATIVE mg/dL
Ketones, ur: NEGATIVE mg/dL
Leukocytes,Ua: NEGATIVE
Nitrite: NEGATIVE
Protein, ur: 30 mg/dL — AB
Specific Gravity, Urine: 1.011 (ref 1.005–1.030)
pH: 5 (ref 5.0–8.0)

## 2020-04-04 LAB — FOLATE: Folate: 7.3 ng/mL (ref >5.9)

## 2020-04-04 LAB — RETICULOCYTES
Immature Retic Fract: 8.2 % (ref 2.3–15.9)
RBC.: 3.27 MIL/uL — ABNORMAL LOW (ref 4.22–5.81)
Retic Count, Absolute: 20.9 10*3/uL (ref 19.0–186.0)
Retic Ct Pct: 0.6 % (ref 0.4–3.1)

## 2020-04-04 LAB — RESP PANEL BY RT-PCR (FLU A&B, COVID) ARPGX2
Influenza A by PCR: NEGATIVE
Influenza B by PCR: NEGATIVE
SARS Coronavirus 2 by RT PCR: NEGATIVE

## 2020-04-04 LAB — URINE DRUG SCREEN, QUALITATIVE (ARMC ONLY)
Amphetamines, Ur Screen: NOT DETECTED
Barbiturates, Ur Screen: NOT DETECTED
Benzodiazepine, Ur Scrn: NOT DETECTED
Cannabinoid 50 Ng, Ur ~~LOC~~: NOT DETECTED
Cocaine Metabolite,Ur ~~LOC~~: POSITIVE — AB
MDMA (Ecstasy)Ur Screen: NOT DETECTED
Methadone Scn, Ur: NOT DETECTED
Opiate, Ur Screen: NOT DETECTED
Phencyclidine (PCP) Ur S: NOT DETECTED
Tricyclic, Ur Screen: NOT DETECTED

## 2020-04-04 LAB — CK: Total CK: 41 U/L — ABNORMAL LOW (ref 49–397)

## 2020-04-04 LAB — FERRITIN: Ferritin: 122 ng/mL (ref 24–336)

## 2020-04-04 LAB — PREPARE RBC (CROSSMATCH)

## 2020-04-04 LAB — TYPE AND SCREEN

## 2020-04-04 LAB — TROPONIN I (HIGH SENSITIVITY): Troponin I (High Sensitivity): 12 ng/L (ref <18)

## 2020-04-04 LAB — BRAIN NATRIURETIC PEPTIDE: B Natriuretic Peptide: 138.8 pg/mL — ABNORMAL HIGH (ref 0.0–100.0)

## 2020-04-04 LAB — IRON AND TIBC
Iron: 16 ug/dL — ABNORMAL LOW (ref 45–182)
Saturation Ratios: 8 % — ABNORMAL LOW (ref 17.9–39.5)
TIBC: 195 ug/dL — ABNORMAL LOW (ref 250–450)
UIBC: 179 ug/dL

## 2020-04-04 LAB — ETHANOL: Alcohol, Ethyl (B): <10 mg/dL (ref <10)

## 2020-04-04 LAB — APTT: aPTT: 50 seconds — ABNORMAL HIGH (ref 24–36)

## 2020-04-04 LAB — VITAMIN B12: Vitamin B-12: 194 pg/mL (ref 180–914)

## 2020-04-04 MED ORDER — METOPROLOL SUCCINATE ER 50 MG PO TB24
50.0000 mg | ORAL_TABLET | Freq: Every day | ORAL | Status: DC
  Administered 2020-04-04: 50 mg via ORAL

## 2020-04-04 MED ORDER — FUROSEMIDE 40 MG PO TABS
80.0000 mg | ORAL_TABLET | Freq: Every day | ORAL | Status: DC
  Administered 2020-04-04: 80 mg via ORAL

## 2020-04-04 MED ORDER — ATORVASTATIN CALCIUM 20 MG PO TABS
80.0000 mg | ORAL_TABLET | Freq: Every day | ORAL | Status: DC
  Filled 2020-04-04 (×2): qty 4

## 2020-04-04 MED ORDER — OXYCODONE-ACETAMINOPHEN 5-325 MG PO TABS
1.0000 | ORAL_TABLET | ORAL | Status: DC | PRN
  Administered 2020-04-04 – 2020-04-08 (×10): 1 via ORAL
  Filled 2020-04-04 (×11): qty 1

## 2020-04-04 MED ORDER — NICOTINE 21 MG/24HR TD PT24
21.0000 mg | MEDICATED_PATCH | Freq: Every day | TRANSDERMAL | Status: DC
  Filled 2020-04-04 (×3): qty 1

## 2020-04-04 MED ORDER — LOSARTAN POTASSIUM 50 MG PO TABS
100.0000 mg | ORAL_TABLET | Freq: Every day | ORAL | Status: DC
  Administered 2020-04-04: 100 mg via ORAL

## 2020-04-04 MED ORDER — PANTOPRAZOLE SODIUM 40 MG PO TBEC
40.0000 mg | DELAYED_RELEASE_TABLET | Freq: Every day | ORAL | Status: DC
  Administered 2020-04-04 – 2020-04-08 (×5): 40 mg via ORAL
  Filled 2020-04-04 (×4): qty 1

## 2020-04-04 MED ORDER — ACETAMINOPHEN 325 MG PO TABS
650.0000 mg | ORAL_TABLET | Freq: Four times a day (QID) | ORAL | Status: DC | PRN
  Filled 2020-04-04: qty 2

## 2020-04-04 MED ORDER — ATORVASTATIN CALCIUM 20 MG PO TABS: 80.0000 mg | ORAL_TABLET | Freq: Every day | ORAL | Status: DC

## 2020-04-04 MED ORDER — SODIUM CHLORIDE 0.9 % IV SOLN
10.0000 mL/h | Freq: Once | INTRAVENOUS | Status: AC
  Administered 2020-04-04: 10 mL/h via INTRAVENOUS

## 2020-04-04 MED ORDER — SODIUM CHLORIDE 0.9 % IV BOLUS: 1000.0000 mL | Freq: Once | INTRAVENOUS | Status: DC

## 2020-04-04 MED ORDER — TIZANIDINE HCL 2 MG PO TABS
2.0000 mg | ORAL_TABLET | Freq: Three times a day (TID) | ORAL | Status: DC
  Administered 2020-04-04 – 2020-04-08 (×12): 2 mg via ORAL
  Filled 2020-04-04 (×14): qty 1

## 2020-04-04 MED ORDER — HYDRALAZINE HCL 20 MG/ML IJ SOLN: 5.0000 mg | INTRAMUSCULAR | Status: DC | PRN

## 2020-04-04 MED ORDER — POTASSIUM CHLORIDE CRYS ER 20 MEQ PO TBCR
20.0000 meq | EXTENDED_RELEASE_TABLET | Freq: Once | ORAL | Status: AC
  Administered 2020-04-04: 20 meq via ORAL
  Filled 2020-04-04: qty 1

## 2020-04-04 MED ORDER — SPIRONOLACTONE 25 MG PO TABS
25.0000 mg | ORAL_TABLET | Freq: Every day | ORAL | Status: DC
  Administered 2020-04-04: 25 mg via ORAL
  Filled 2020-04-04: qty 1

## 2020-04-04 MED ORDER — ONDANSETRON HCL 4 MG/2ML IJ SOLN: 4.0000 mg | Freq: Three times a day (TID) | INTRAMUSCULAR | Status: DC | PRN

## 2020-04-04 MED ORDER — ATORVASTATIN CALCIUM 80 MG PO TABS
80.0000 mg | ORAL_TABLET | Freq: Every day | ORAL | Status: DC
Start: 2020-04-05 — End: 2020-04-08
  Administered 2020-04-05 – 2020-04-08 (×5): 80 mg via ORAL
  Filled 2020-04-04 (×2): qty 4
  Filled 2020-04-04 (×3): qty 1
  Filled 2020-04-04: qty 4
  Filled 2020-04-04 (×2): qty 1

## 2020-04-04 NOTE — ED Notes (Signed)
Pt found to be completely soiled with urine and feces. Clothes removed and thrown away per pt request.

## 2020-04-04 NOTE — ED Provider Notes (Addendum)
[facility] Emergency Department Provider Note  Time seen: 12:06 PM  I have reviewed the triage vital signs and the nursing notes.   HISTORY  Chief Complaint Weakness  HPI Nathan [name]. is a 70 y.o. male with a past medical history of paroxysmal atrial fibrillation, CKD, hypertension, MI, pulmonary hypertension, prior CVA, presents to the emergency department for generalized weakness and fatigue.  According to the patient for the past 2 days he has been experiencing generalized weakness unable to get up out of his chair.  States he lives with his girlfriend who is disabled and cannot help him out of the chair.  EMS states when they arrived patient was covered in stool and urine with a very strong urine smell.  Patient denies any nausea or vomiting or diarrhea.  Denies any known fever cough or shortness of breath.  Does state he has been experiencing pain in both of his shoulders that radiates into his chest at times.  Patient is awake alert and oriented.  Past Medical History:  Diagnosis Date  . Atrial flutter ([place])    a. s/p TEE/DCCV 04/2017; b. 06/2017 s/p RFCA; c. CHADS2VASc => 5 (CHF, HTN, age x 1, CVA)-->noncompliant with Xarelto.  . Chronic combined systolic and diastolic CHF (congestive heart failure) ([place])    a. TTE 2/19: EF 20-25%, diffuse HK; b. TTE 3/19: EF 20-25%, diffuse HK; c. 08/2017 Echo: EF 35-40%, diff HK. Gr1 DD; d. 06/2018 Echo: EF 40-45%, DD. Neg bubble study.  . CKD (chronic kidney disease), stage II-III   . Essential hypertension   . GI bleed    a.  Hemorrhagic shock in 11/19 secondary to erosive gastropathy and Barrett's esophagus requiring multiple units PRBCs; b. Cleared to resume OAC->pt did not.  Marland Kitchen NICM (nonischemic cardiomyopathy) ([place])    a. 04/2017: Echo EF 20-25%; b. TTE 3/19: EF 20-25%; c. 07/2017 Cath: min irregs; d. 08/2017 Echo: EF 35-40%, diff HK; e. 06/2018 Echo: EF 40-45%, DD.  Marland Kitchen Noncompliance with medications   . PAF (paroxysmal  atrial fibrillation) ([place])    a. 07/2017 afib->broke w/ IV amio->converted to oral bb due to prior intol to oral amio.  . Pulmonary hypertension ([place])   . Stroke [facility])    a. 06/2018 MRI/A Brain: R paramedian pons late acute/early subacute infarct, 20m. No assoc hemorrhage or mass effect.  Sev chronic microvascular isch changtes and mod voluem loss. No large vessel occlusion, aneurysm, or significant stenosis.    Patient Active Problem List   Diagnosis Date Noted  . CVA (cerebral vascular accident) ([place] 06/30/2018  . CLE (columnar lined esophagus)   . Stomach irritation   . Acute posthemorrhagic anemia   . Hyperkalemia   . Dilated cardiomyopathy ([place] 08/26/2017  . Chronic a-fib ([place] 07/15/2017  . Chronic systolic heart failure ([place] 07/09/2017  . HTN (hypertension) 07/09/2017  . Tobacco use 07/09/2017  . Atrial [place] ([place] 06/15/2017  . A-fib ([place] 06/12/2017  . Atrial flutter with rapid ventricular response ([place] 04/29/2017  . Elevated troponin 04/29/2017  . AKI (acute kidney injury) ([place] 04/29/2017    Past Surgical History:  Procedure Laterality Date  . A-FLUTTER ABLATION N/A 06/16/2017   Procedure: A-FLUTTER ABLATION;  Surgeon: TEvans Lance MD;  [facility];  Service: Cardiovascular;  Laterality: N/A;  . CARDIAC CATHETERIZATION    . CARDIOVERSION N/A 05/01/2017   Procedure: CARDIOVERSION;  Surgeon: GMinna Merritts MD;  Location: [facility] ORS;  Service: Cardiovascular;  Laterality: N/A;  . CORONARY  ANGIOPLASTY    . ESOPHAGOGASTRODUODENOSCOPY (EGD) WITH PROPOFOL N/A 02/09/2018   Procedure: ESOPHAGOGASTRODUODENOSCOPY (EGD) WITH PROPOFOL;  Surgeon: [name], MD;  Location: [facility] ENDOSCOPY;  Service: Endoscopy;  Laterality: N/A;  . RIGHT/LEFT HEART CATH AND CORONARY ANGIOGRAPHY N/A 07/18/2017   Procedure: RIGHT/LEFT HEART CATH AND CORONARY ANGIOGRAPHY;  Surgeon: [name], MD;  Location: [facility];  Service: Cardiovascular;   Laterality: N/A;  . TEE WITHOUT CARDIOVERSION N/A 05/01/2017   Procedure: TRANSESOPHAGEAL ECHOCARDIOGRAM (TEE);  Surgeon: [name], MD;  Location: [facility] ORS;  Service: Cardiovascular;  Laterality: N/A;  . TEE WITHOUT CARDIOVERSION N/A 06/16/2017   Procedure: TRANSESOPHAGEAL ECHOCARDIOGRAM (TEE);  Surgeon: [name], MD;  Location[facility] ENDOSCOPY;  Service: Cardiovascular;  Laterality: N/A;    Prior to Admission medications   Medication Sig Start Date End Date Taking? Authorizing Provider  atorvastatin (LIPITOR) 80 MG tablet TAKE 1 TABLET BY MOUTH ONCE DAILY 09/10/19   Theora Gianotti, NP  ELIQUIS 5 MG TABS tablet Take 1 tablet by mouth twice daily 02/28/20   [name], MD  furosemide (LASIX) 40 MG tablet Take 1 tablet (40 mg total) by mouth daily. 01/14/20 04/13/20  [name], MD  losartan (COZAAR) 100 MG tablet Take 1 tablet by mouth once daily 01/14/20   [name], MD  metoprolol succinate (TOPROL-XL) 50 MG 24 hr tablet TAKE 1 TABLET BY MOUTH ONCE DAILY. TAKE WITH OR IMMEDIATELY FOLLOWING A MEAL. 06/29/19   [name], MD  pantoprazole (PROTONIX) 40 MG tablet TAKE 1 TABLET BY MOUTH IN THE MORNING AT  6  AM 12/08/19   [name], MD  spironolactone (ALDACTONE) 25 MG tablet Take 1 tablet (25 mg total) by mouth daily. 12/08/19   [name], MD    No Known Allergies  Family History  Problem Relation Age of Onset  . Alzheimer's disease Mother   . Alzheimer's disease Father     Social History Social History   Tobacco Use  . Smoking status: Former Smoker    Quit date: 04/21/2017    Years since quitting: 2.9  . Smokeless tobacco: Never Used  Vaping Use  . Vaping Use: Never used  Substance Use Topics  . Alcohol use: Yes    Alcohol/week: 3.0 standard drinks    Types: 3 Cans of beer per week    Comment: Drink a half of a 40oz beer every day, drank heavily in the past  . Drug use: Never    Review of Systems Constitutional:  Negative for fever.  Positive generalized fatigue and weakness Cardiovascular: Pain in bilateral shoulders into his chest x2 days Respiratory: Negative for shortness of breath.  Negative for cough. Gastrointestinal: Negative for abdominal pain, vomiting and diarrhea. Genitourinary: Negative for urinary compaints Musculoskeletal: Positive for generalized weakness in all extremities.   Skin: Negative for skin complaints  Neurological: Negative for headache.  Generalized weakness without focal weakness per patient All other ROS negative  ____________________________________________   PHYSICAL EXAM:  Constitutional: Patient is awake alert oriented.  No acute distress.  States generalized weakness. Eyes: Normal exam ENT      Head: Normocephalic and atraumatic.      Mouth/Throat: Mucous membranes are moist. Cardiovascular: Normal rate, regular rhythm.  Respiratory: Normal respiratory effort without tachypnea nor retractions. Breath sounds are clear  Gastrointestinal: Soft and nontender. No distention.  Musculoskeletal: Nontender with normal range of motion in all extremities.  Neurologic:  Normal speech and language.  Decreased  strength bilateral upper and lower extremities. Skin:  Skin is warm, dry and intact.  Psychiatric: Mood and affect are normal.  ____________________________________________    EKG  EKG viewed and interpreted by myself shows sinus tachycardia at 100 bpm with a narrow QRS, normal axis, normal intervals, nonspecific ST changes.  ____________________________________________    RADIOLOGY  CT scan of the head is negative for acute abnormality. Chest x-ray is negative for acute abnormality. ____________________________________________   INITIAL IMPRESSION / ASSESSMENT AND PLAN / ED COURSE  Pertinent labs & imaging results that were available during my care of the patient were reviewed by me and considered in my medical decision making (see chart for  details).   Patient presents to the emergency department for generalized weakness unable to get up out of his chair for the past 2 days.  Patient is covered in urine and stool upon arrival.  We will check labs, CT scan of the head, chest x-ray.  We will obtain a urine sample.  We will obtain a COVID PCR swab.  Patient states he has not been vaccinated.  No focal deficits on exam but the patient does have generalized weakness.  Do not believe would be safe to discharge the patient home regardless of the work-up.  Patient will either require admission depending on the emergency department work-up versus social work placement.  Patient's labs have resulted showing fairly significant anemia with a hemoglobin of 7.4 down from 11.3 1-year ago.  Rectal examination shows light brown stool that is guaiac negative.  Patient continues states significant weakness.  COVID test is negative.  Lab work consistent with dehydration, receiving IV fluids.  Given the patient's significant weakness with fairly significant anemia we will admit the patient to the hospitalist service for symptomatic anemia and further work-up and treatment.  Urine is pending at this time.  Spoke to the hospitalist.  Given the patient's significant weakness and low hemoglobin we will transfuse 1 unit of packed red blood cells.  Patient agreeable.  Nathan [name]. was evaluated in Emergency Department on 04/04/2020 for the symptoms described in the history of present illness. He was evaluated in the context of the global COVID-19 pandemic, which necessitated consideration that the patient might be at risk for infection with the SARS-CoV-2 virus that causes COVID-19. Institutional protocols and algorithms that pertain to the evaluation of patients at risk for COVID-19 are in a state of rapid change based on information released by regulatory bodies including the CDC and federal and state organizations. These policies and algorithms were followed during  the patient's care in the ED.  CRITICAL CARE Performed by: Harvest Dark   Total critical care time: 30 minutes  Critical care time was exclusive of separately billable procedures and treating other patients.  Critical care was necessary to treat or prevent imminent or life-threatening deterioration.  Critical care was time spent personally by me on the following activities: development of treatment plan with patient and/or surrogate as well as nursing, discussions with consultants, evaluation of patient's response to treatment, examination of patient, obtaining history from patient or surrogate, ordering and performing treatments and interventions, ordering and review of laboratory studies, ordering and review of radiographic studies, pulse oximetry and re-evaluation of patient's condition.  ____________________________________________   FINAL CLINICAL IMPRESSION(S) / ED DIAGNOSES  Symptomatic anemia Generalized weakness   Harvest Dark, MD 04/04/20 1417    Harvest Dark, MD 04/04/20 1430

## 2020-04-04 NOTE — H&P (Signed)
History and Physical    Nathan [name]. [id number] DOB: October 28, 1950 DOA: 04/04/2020  Referring MD/NP/PA:   PCP: [name], MD   Patient coming from:  The patient is coming from home.  At baseline, pt is independent for most of ADL.        Chief Complaint: weakness  HPI: [name]. is a 70 y.o. male with medical history significant of hypertension, hyperlipidemia, stroke, GERD, atrial fibrillation on Eliquis, sCHF EF of 40-45%, pulmonary hypertension, GI bleeding, CKD-3, tobacco abuse, who presents with weakness.  Patient states that he has generalized weakness for more than 2 days, which has been progressively worsening, No unilateral numbness or tingling in his extremities.  No facial droop or slurred speech.  Patient also reports worsening left shoulder pain and back pain recently.  Patient denies chest pain, shortness of breath, cough.  No nausea, vomiting, diarrhea, abdominal pain, symptoms of UTI.  Patient denies recent rectal bleeding or dark stool.  ED Course: pt was found to have hemoglobin dropped from 11.7 on 03/24/2018 to 7.4, negative FOBT per ED physician, negative COVID19, CK 41, alcohol level less than 10, stable renal function, WBC 7.8, troponin level 12, potassium 3.4, temperature normal, blood pressure 156/94, heart rate 111, RR 18, oxygen sat 95% on room air.  Chest x-ray negative.  CT head is negative for acute intracranial abnormalities.  Patient is admitted to [facility] bed for observation.   Review of Systems:   General: no fevers, chills, no body weight gain, has fatigue and weakness HEENT: no blurry vision, hearing changes or sore throat Respiratory: no dyspnea, coughing, wheezing CV: no chest pain, no palpitations GI: no nausea, vomiting, abdominal pain, diarrhea, constipation GU: no dysuria, burning on urination, increased urinary frequency, hematuria  Ext: Has leg edema Neuro: no unilateral weakness, numbness, or tingling, no vision change or  hearing loss Skin: no rash, no skin tear. MSK: No muscle spasm, no deformity, no limitation of range of movement in spin Heme: No easy bruising.  Travel history: No recent long distant travel.  Allergy: No Known Allergies  Past Medical History:  Diagnosis Date  . Atrial flutter ([place])    a. s/p TEE/DCCV 04/2017; b. 06/2017 s/p RFCA; c. CHADS2VASc => 5 (CHF, HTN, age x 1, CVA)-->noncompliant with Xarelto.  . Chronic combined systolic and diastolic CHF (congestive heart failure) ([place])    a. TTE 2/19: EF 20-25%, diffuse HK; b. TTE 3/19: EF 20-25%, diffuse HK; c. 08/2017 Echo: EF 35-40%, diff HK. Gr1 DD; d. 06/2018 Echo: EF 40-45%, DD. Neg bubble study.  . CKD (chronic kidney disease), stage II-III   . Essential hypertension   . GI bleed    a.  Hemorrhagic shock in 11/19 secondary to erosive gastropathy and Barrett's esophagus requiring multiple units PRBCs; b. Cleared to resume OAC->pt did not.  Marland Kitchen NICM (nonischemic cardiomyopathy) ([place])    a. 04/2017: Echo EF 20-25%; b. TTE 3/19: EF 20-25%; c. 07/2017 Cath: min irregs; d. 08/2017 Echo: EF 35-40%, diff HK; e. 06/2018 Echo: EF 40-45%, DD.  Marland Kitchen Noncompliance with medications   . PAF (paroxysmal atrial fibrillation) ([place])    a. 07/2017 afib->broke w/ IV amio->converted to oral bb due to prior intol to oral amio.  . Pulmonary hypertension ([place])   . Stroke [facility])    a. 06/2018 MRI/A Brain: R paramedian pons late acute/early subacute infarct, 24m. No assoc hemorrhage or mass effect.  Sev chronic microvascular isch changtes and mod voluem loss. No large vessel occlusion,  aneurysm, or significant stenosis.    Past Surgical History:  Procedure Laterality Date  . A-FLUTTER ABLATION N/A 06/16/2017   Procedure: A-FLUTTER ABLATION;  Surgeon: [name], MD;  Location: [facility];  Service: Cardiovascular;  Laterality: N/A;  . CARDIAC CATHETERIZATION    . CARDIOVERSION N/A 05/01/2017   Procedure: CARDIOVERSION;  Surgeon: [name], MD;   Location: [facility] ORS;  Service: Cardiovascular;  Laterality: N/A;  . CORONARY ANGIOPLASTY    . ESOPHAGOGASTRODUODENOSCOPY (EGD) WITH PROPOFOL N/A 02/09/2018   Procedure: ESOPHAGOGASTRODUODENOSCOPY (EGD) WITH PROPOFOL;  Surgeon: [name], MD;  Location: [facility] ENDOSCOPY;  Service: Endoscopy;  Laterality: N/A;  . RIGHT/LEFT HEART CATH AND CORONARY ANGIOGRAPHY N/A 07/18/2017   Procedure: RIGHT/LEFT HEART CATH AND CORONARY ANGIOGRAPHY;  Surgeon: [name], MD;  Location: [facility];  Service: Cardiovascular;  Laterality: N/A;  . TEE WITHOUT CARDIOVERSION N/A 05/01/2017   Procedure: TRANSESOPHAGEAL ECHOCARDIOGRAM (TEE);  Surgeon: [name], MD;  Location: [facility] ORS;  Service: Cardiovascular;  Laterality: N/A;  . TEE WITHOUT CARDIOVERSION N/A 06/16/2017   Procedure: TRANSESOPHAGEAL ECHOCARDIOGRAM (TEE);  Surgeon: [name], MD;  Location: [facility] ENDOSCOPY;  Service: Cardiovascular;  Laterality: N/A;    Social History:  reports that he quit smoking about 2 years ago. He has never used smokeless tobacco. He reports current alcohol use of about 3.0 standard drinks of alcohol per week. He reports that he does not use drugs.  Family History:  Family History  Problem Relation Age of Onset  . Alzheimer's disease Mother   . Alzheimer's disease Father      Prior to Admission medications   Medication Sig Start Date End Date Taking? Authorizing Provider  atorvastatin (LIPITOR) 80 MG tablet TAKE 1 TABLET BY MOUTH ONCE DAILY 09/10/19   Theora Gianotti, NP  ELIQUIS 5 MG TABS tablet Take 1 tablet by mouth twice daily 02/28/20   [name], MD  furosemide (LASIX) 40 MG tablet Take 1 tablet (40 mg total) by mouth daily. 01/14/20 04/13/20  [name], MD  losartan (COZAAR) 100 MG tablet Take 1 tablet by mouth once daily 01/14/20   [name], MD  metoprolol succinate (TOPROL-XL) 50 MG 24 hr tablet TAKE 1 TABLET BY MOUTH ONCE DAILY. TAKE WITH OR  IMMEDIATELY FOLLOWING A MEAL. 06/29/19   [name], MD  pantoprazole (PROTONIX) 40 MG tablet TAKE 1 TABLET BY MOUTH IN THE MORNING AT  6  AM 12/08/19   [name], MD  spironolactone (ALDACTONE) 25 MG tablet Take 1 tablet (25 mg total) by mouth daily. 12/08/19   [name], MD    Physical Exam: Vitals:   04/04/20 1208 04/04/20 1209  BP: (!) 156/94   Pulse: (!) 111   Resp: 18   Temp: 98.2 F (36.8 C)   TempSrc: Oral   SpO2: 95%   Weight:  109.8 kg  Height:  '6\' 5"'$  (1.956 m)   General: Not in acute distress HEENT:       Eyes: PERRL, EOMI, no scleral icterus.       ENT: No discharge from the ears and nose, no pharynx injection, no tonsillar enlargement.        Neck: No JVD, no bruit, no mass felt. Heme: No neck lymph node enlargement. Cardiac: S1/S2, RRR, No murmurs, No gallops or rubs. Respiratory: No rales, wheezing, rhonchi or rubs. GI: Soft, nondistended, nontender, no rebound pain, no organomegaly, BS present. GU: No hematuria Ext: 1+  pitting leg edema bilaterally. 1+DP/PT pulse bilaterally. Musculoskeletal: No joint deformities, No joint redness or warmth, no limitation of ROM in spin. Skin: No rashes.  Neuro: Alert, oriented X3, cranial nerves II-XII grossly intact, moves all extremities normally. Psych: Patient is not psychotic, no suicidal or hemocidal ideation.  Labs on Admission: I have personally reviewed following labs and imaging studies  CBC: Recent Labs  Lab 04/04/20 1213 04/04/20 1457  WBC 7.8  --   HGB 7.4* 8.2*  HCT 23.4* 25.6*  MCV 79.1*  --   PLT 381  --    Basic Metabolic Panel: Recent Labs  Lab 04/04/20 1213  NA 138  K 3.4*  CL 100  CO2 23  GLUCOSE 106*  BUN 32*  CREATININE 1.43*  CALCIUM 9.2   GFR: Estimated Creatinine Clearance: 67.2 mL/min (A) (by C-G formula based on SCr of 1.43 mg/dL (H)). Liver Function Tests: Recent Labs  Lab 04/04/20 1213  AST 30  ALT 16  ALKPHOS 68  BILITOT 0.7  PROT 8.4*  ALBUMIN  2.9*   No results for input(s): LIPASE, AMYLASE in the last 168 hours. No results for input(s): AMMONIA in the last 168 hours. Coagulation Profile: No results for input(s): INR, PROTIME in the last 168 hours. Cardiac Enzymes: Recent Labs  Lab 04/04/20 1213  CKTOTAL 41*   BNP (last 3 results) No results for input(s): PROBNP in the last 8760 hours. HbA1C: No results for input(s): HGBA1C in the last 72 hours. CBG: No results for input(s): GLUCAP in the last 168 hours. Lipid Profile: No results for input(s): CHOL, HDL, LDLCALC, TRIG, CHOLHDL, LDLDIRECT in the last 72 hours. Thyroid Function Tests: No results for input(s): TSH, T4TOTAL, FREET4, T3FREE, THYROIDAB in the last 72 hours. Anemia Panel: Recent Labs    04/04/20 1430  RETICCTPCT 0.6   Urine analysis: No results found for: COLORURINE, APPEARANCEUR, LABSPEC, PHURINE, GLUCOSEU, HGBUR, BILIRUBINUR, KETONESUR, PROTEINUR, UROBILINOGEN, NITRITE, LEUKOCYTESUR Sepsis Labs: '@LABRCNTIP'$ (procalcitonin:4,lacticidven:4) ) Recent Results (from the past 240 hour(s))  Resp Panel by RT-PCR (Flu A&B, Covid) Nasopharyngeal Swab     Status: None   Collection Time: 04/04/20 12:13 PM   Specimen: Nasopharyngeal Swab; Nasopharyngeal(NP) swabs in vial transport medium  Result Value Ref Range Status   SARS Coronavirus 2 by RT PCR NEGATIVE NEGATIVE Final    Comment: (NOTE) SARS-CoV-2 target nucleic acids are NOT DETECTED.  The SARS-CoV-2 RNA is generally detectable in upper respiratory specimens during the acute phase of infection. The lowest concentration of SARS-CoV-2 viral copies this assay can detect is 138 copies/mL. A negative result does not preclude SARS-Cov-2 infection and should not be used as the sole basis for treatment or other patient management decisions. A negative result may occur with  improper specimen collection/handling, submission of specimen other than nasopharyngeal swab, presence of viral mutation(s) within the areas  targeted by this assay, and inadequate number of viral copies(<138 copies/mL). A negative result must be combined with clinical observations, patient history, and epidemiological information. The expected result is Negative.  Fact Sheet for Patients:  EntrepreneurPulse.com.au  Fact Sheet for Healthcare Providers:  IncredibleEmployment.be  This test is no t yet approved or cleared by the [place] FDA and  has been authorized for detection and/or diagnosis of SARS-CoV-2 by FDA under an Emergency Use Authorization (EUA). This EUA will remain  in effect (meaning this test can be used) for the duration of the COVID-19 declaration under Section 564(b)(1) of the Act, 21 U.S.C.section 360bbb-3(b)(1), unless the authorization is terminated  or  revoked sooner.       Influenza A by PCR NEGATIVE NEGATIVE Final   Influenza B by PCR NEGATIVE NEGATIVE Final    Comment: (NOTE) The Xpert Xpress SARS-CoV-2/FLU/RSV plus assay is intended as an aid in the diagnosis of influenza from Nasopharyngeal swab specimens and should not be used as a sole basis for treatment. Nasal washings and aspirates are unacceptable for Xpert Xpress SARS-CoV-2/FLU/RSV testing.  Fact Sheet for Patients: EntrepreneurPulse.com.au  Fact Sheet for Healthcare Providers: IncredibleEmployment.be  This test is not yet approved or cleared by the [place] FDA and has been authorized for detection and/or diagnosis of SARS-CoV-2 by FDA under an Emergency Use Authorization (EUA). This EUA will remain in effect (meaning this test can be used) for the duration of the COVID-19 declaration under Section 564(b)(1) of the Act, 21 U.S.C. section 360bbb-3(b)(1), unless the authorization is terminated or revoked.  Performed at [facility], [address] [place], [place] [address]      Radiological Exams on Admission: CT Head Wo  Contrast  Result Date: 04/04/2020 CLINICAL DATA:  Stroke suspected, history of stroke, patient found down soiled with urine and feces. EXAM: CT HEAD WITHOUT CONTRAST TECHNIQUE: Contiguous axial images were obtained from the base of the skull through the vertex without intravenous contrast. COMPARISON:  MRI head June 30, 2018 and CT head June 30, 2018. FINDINGS: Brain: Stable moderate diffuse parenchymal atrophy. There is no intracranial mass, hemorrhage, extra-axial fluid collection or midline shift. Similar severe burden of ischemic white matter disease. Chronic lacunar type infarct in the pons. Vascular: No hyperdense vessel or unexpected calcification. Atherosclerotic calcifications of the carotid and vertebral arteries. Skull: Normal. Negative for fracture or focal lesion. Sinuses/Orbits: No acute finding. Prior left cataract removal. Likely dense cerumen in the bilateral external auditory canals. Other: None IMPRESSION: 1. No acute intracranial abnormality. 2. Stable moderate diffuse parenchymal atrophy and severe ischemic white matter disease. Electronically Signed   By: Dahlia Bailiff MD   On: 04/04/2020 12:57   DG Chest Portable 1 View  Result Date: 04/04/2020 CLINICAL DATA:  weakness EXAM: PORTABLE CHEST 1 VIEW COMPARISON:  07/06/2018 and prior. FINDINGS: No focal consolidation. Mild hypoinflation. No pneumothorax or pleural effusion. Cardiomediastinal silhouette is within normal limits. No acute osseous abnormality. IMPRESSION: No focal airspace disease. Electronically Signed   By: Primitivo Gauze M.D.   On: 04/04/2020 13:05     EKG: I have personally reviewed.  Seems to be atrial fibrillation, QTC 423, nonspecific T wave change  Assessment/Plan Principal Problem:   Symptomatic anemia Active Problems:   Chronic systolic heart failure (HCC)   HTN (hypertension)   Tobacco use   Chronic a-fib (HCC)   CVA (cerebral vascular accident) ([place])   Hypokalemia   HLD (hyperlipidemia)    GERD (gastroesophageal reflux disease)   CKD (chronic kidney disease), stage IIIa   Tobacco abuse    Symptomatic anemia: Patient's generalized weakness is likely due to symptomatic anemia.  Patient does not have focal neurodeficit on physical examination.  No signs of infection.  Pending urinalysis.  CT head is negative.  Hemoglobin dropped from 11.7 to 7.4.  Patient is Eliquis for atrial fibrillation.  He denies recent dark stool or rectal bleeding.  Per ED physician, FOBT negative.  There is a possibility that patient may have episodic GI blood loss.  -will place on MedSurg bed for observation -Transfuse 1 unit of blood which is ordered by ED physician -Check anemia panel -Repeat H&H and CBC in AM -  hold off Eliquis now -PT.OT -f/u UA  Chronic systolic heart failure ([place]): 2D echo on 07/01/2018 showed EF of 40- 45%.  Patient has trace leg edema, no respiratory distress, no pulmonary edema on chest x-ray.  CHF seems to be compensated. -Continue home spironolactone, Lasix -check BNP -->138  HTN (hypertension) -IV hydralazine as needed. -Continue Cozaar, metoprolol  Tobacco use -Nicotine patch  Chronic a-fib (HCC) -Metoprolol -Hold Eliquis temporarily due to concerning for GI blood loss  CVA (cerebral vascular accident) ([place]) -Lipitor  Hypokalemia: Potassium 3.4 -Replete potassium -Check magnesium level  HLD (hyperlipidemia) -Lipitor  GERD (gastroesophageal reflux disease) -Protonix  CKD (chronic kidney disease), stage IIIa: Stable -Follow-up with BMP  Tobacco abuse -Nicotine patch    DVT ppx: SCD Code Status: Full code Family Communication: not done, no family member is at bed side. Disposition Plan:  Anticipate discharge back to previous environment Consults called:  none Admission status: Med-surg bed for obs   Status is: Observation  The patient remains OBS appropriate and will d/c before 2 midnights.  Dispo: The patient is from: Home               Anticipated d/c is to: Home              Anticipated d/c date is: 1 day              Patient currently is not medically stable to d/c.             Date of Service 04/04/2020    Ivor Costa [facility]   If 7PM-7AM, please contact night-coverage [email or web address] 04/04/2020, 3:59 PM

## 2020-04-05 DIAGNOSIS — D649 Anemia, unspecified: Secondary | ICD-10-CM | POA: Diagnosis not present

## 2020-04-05 DIAGNOSIS — D509 Iron deficiency anemia, unspecified: Secondary | ICD-10-CM | POA: Diagnosis not present

## 2020-04-05 LAB — BASIC METABOLIC PANEL
Anion gap: 9 (ref 5–15)
BUN: 29 mg/dL — ABNORMAL HIGH (ref 8–23)
CO2: 25 mmol/L (ref 22–32)
Calcium: 8.8 mg/dL — ABNORMAL LOW (ref 8.9–10.3)
Chloride: 104 mmol/L (ref 98–111)
Creatinine, Ser: 1.38 mg/dL — ABNORMAL HIGH (ref 0.61–1.24)
GFR, Estimated: 55 mL/min — ABNORMAL LOW (ref >60)
Glucose, Bld: 107 mg/dL — ABNORMAL HIGH (ref 70–99)
Potassium: 4 mmol/L (ref 3.5–5.1)
Sodium: 138 mmol/L (ref 135–145)

## 2020-04-05 LAB — CBC
HCT: 24.5 % — ABNORMAL LOW (ref 39.0–52.0)
Hemoglobin: 7.9 g/dL — ABNORMAL LOW (ref 13.0–17.0)
MCH: 25.6 pg — ABNORMAL LOW (ref 26.0–34.0)
MCHC: 32.2 g/dL (ref 30.0–36.0)
MCV: 79.5 fL — ABNORMAL LOW (ref 80.0–100.0)
Platelets: 328 10*3/uL (ref 150–400)
RBC: 3.08 MIL/uL — ABNORMAL LOW (ref 4.22–5.81)
RDW: 16 % — ABNORMAL HIGH (ref 11.5–15.5)
WBC: 6.1 10*3/uL (ref 4.0–10.5)
nRBC: 0 % (ref 0.0–0.2)

## 2020-04-05 LAB — VITAMIN D 25 HYDROXY (VIT D DEFICIENCY, FRACTURES): Vit D, 25-Hydroxy: 11.3 ng/mL — ABNORMAL LOW (ref 30–100)

## 2020-04-05 LAB — HIV ANTIBODY (ROUTINE TESTING W REFLEX): HIV Screen 4th Generation wRfx: NONREACTIVE

## 2020-04-05 MED ORDER — SPIRONOLACTONE 25 MG PO TABS
25.0000 mg | ORAL_TABLET | Freq: Every day | ORAL | Status: DC
Start: 2020-04-06 — End: 2020-04-06

## 2020-04-05 MED ORDER — VITAMIN D (ERGOCALCIFEROL) 1.25 MG (50000 UNIT) PO CAPS
50000.0000 [IU] | ORAL_CAPSULE | ORAL | Status: DC
  Administered 2020-04-05: 50000 [IU] via ORAL
  Filled 2020-04-05: qty 1

## 2020-04-05 MED ORDER — FUROSEMIDE 40 MG PO TABS: 80.0000 mg | ORAL_TABLET | Freq: Every day | ORAL | Status: DC

## 2020-04-05 MED ORDER — LOSARTAN POTASSIUM 50 MG PO TABS: 100.0000 mg | ORAL_TABLET | Freq: Every day | ORAL | Status: DC

## 2020-04-05 MED ORDER — ADULT MULTIVITAMIN W/MINERALS CH
1.0000 | ORAL_TABLET | Freq: Every day | ORAL | Status: DC
  Administered 2020-04-05 – 2020-04-08 (×4): 1 via ORAL
  Filled 2020-04-05 (×4): qty 1

## 2020-04-05 MED ORDER — METOPROLOL SUCCINATE ER 50 MG PO TB24: 50.0000 mg | ORAL_TABLET | Freq: Every day | ORAL | Status: DC

## 2020-04-05 MED ORDER — SODIUM CHLORIDE 0.9 % IV SOLN
200.0000 mg | INTRAVENOUS | Status: AC
  Administered 2020-04-05 – 2020-04-07 (×3): 200 mg via INTRAVENOUS
  Filled 2020-04-05 (×3): qty 10

## 2020-04-05 MED ORDER — CYANOCOBALAMIN 1000 MCG/ML IJ SOLN
1000.0000 ug | Freq: Every day | INTRAMUSCULAR | Status: DC
  Administered 2020-04-05 – 2020-04-08 (×4): 1000 ug via INTRAMUSCULAR
  Filled 2020-04-05 (×4): qty 1

## 2020-04-05 NOTE — Care Management Obs Status (Signed)
[place] NOTIFICATION   Patient Details  Name: Nathan [name]. MRN: [id number] Date of Birth: December 15, 1950   Medicare Observation Status Notification Given:  Yes    Gem Conkle E Gizel Riedlinger, LCSW 04/05/2020, 2:45 PM

## 2020-04-05 NOTE — Plan of Care (Addendum)
Assumed care for pt at 1900. Pt alert and oriented, on room air, afebrile. Pt reported pain controlled with PRN percocet,Fall precautions remained in place call bell within reach   RN checked order for blood transfusion: no consent in place. Provider notified.   11: RN educated pt on the need for transfusion. Pt agreed to it but refused to sign consent stating his hands are weak and he cant sign, charge nurse notified. Charge nurse educated pt on the need for a consent to be signed prior to transfusion. Pt signed consent witnessed by RN.  0100: RN in blood bank to get blood. For some reasons unable to get one at this time.  0230. Blood bank called that blood is ready 0239: Rn started blood transfusion with no transfusion reaction. [id number]: transfusion done. Pt tolerated it. No transfusion reactions noted Problem: Education: Goal: Ability to verbalize understanding of medication therapies will improve Outcome: Progressing   Problem: Activity: Goal: Capacity to carry out activities will improve Outcome: Progressing   Problem: Cardiac: Goal: Ability to achieve and maintain adequate cardiopulmonary perfusion will improve Outcome: Progressing

## 2020-04-05 NOTE — Evaluation (Signed)
Occupational Therapy Evaluation Patient Details Name: Nathan [name]. MRN: [id number] DOB: 1950-06-17 Today's Date: 04/05/2020    History of Present Illness Per MD notes: Pt is a 70 y.o. male with medical history significant of hypertension, hyperlipidemia, stroke, GERD, atrial fibrillation on Eliquis, sCHF EF of 40-45%, pulmonary hypertension, GI bleeding, CKD-3, tobacco abuse, who presents with weakness.  MD assessment includes: Symptomatic anemia and hypokalemia.   Clinical Impression   Pt seen for OT evaluation on this date. Upon arrival to room, pt easily awoken, A&O x 4, and reporting no pain. Pt reports being mod-independent in dressing and sponge-bathing prior to admission. Pt reports receiving assistance from girlfriend for cooking and home maintenance. Pt reports using a scooter for functional mobility in/out of the home. Pt agreeable to OT session, however deferred OOB mobility following performance with PT this AM. This session, pt required MIN A for LLE control in bed mobility, MIN A for seated UB bathing, MOD A for seated LB bathing, and MAX A for LB dressing. Pt currently presents with decreased endurance, balance, and strength, and would benefit from continued OT services to improve activity tolerance and maximize independence with ADLs/functional mobility. Upon discharge, recommend SNF.     Follow Up Recommendations  SNF    Equipment Recommendations  Other (comment) (defer to next venue of care)       Precautions / Restrictions Precautions Precautions: Fall Restrictions Weight Bearing Restrictions: No      Mobility Bed Mobility Overal bed mobility: Needs Assistance Bed Mobility: Supine to Sit;Sit to Supine     Supine to sit: Min assist Sit to supine: Min assist   General bed mobility comments: MIN A for LLE    Transfers                 General transfer comment: OOB mobility deferred by pt following performance with PT session this AM    Balance  Overall balance assessment: Needs assistance Sitting-balance support: No upper extremity supported;Feet supported Sitting balance-Leahy Scale: Good Sitting balance - Comments: sitting EOB for ADLs                                   ADL either performed or assessed with clinical judgement   ADL Overall ADL's : Needs assistance/impaired     Grooming: Wash/dry face;Set up;Supervision/safety   Upper Body Bathing: Minimal assistance;Sitting Upper Body Bathing Details (indicate cue type and reason): MIN A for washing 50% R UE d/t L UE weakness Lower Body Bathing: Moderate assistance;Sit to/from stand Lower Body Bathing Details (indicate cue type and reason): Pt requires assistance for washing feet and backside Upper Body Dressing : Supervision/safety;Sitting   Lower Body Dressing: Maximal assistance;Bed level Lower Body Dressing Details (indicate cue type and reason): to don/doff socks               General ADL Comments: functional mobility deferred by pt following performance with PT session this AM     Vision Baseline Vision/History: Wears glasses Wears Glasses: At all times              Pertinent Vitals/Pain Pain Assessment: No/denies pain (no pain at rest; c/o of LLE pain during mobility)      Hand Dominance Left   Extremity/Trunk Assessment Upper Extremity Assessment Upper Extremity Assessment: Generalized weakness;LUE deficits/detail LUE Deficits / Details: Chronic weakness from CVA; MMT  3+/5 for shoulder & elbow flexion   Lower Extremity  Assessment Lower Extremity Assessment: Defer to PT evaluation        Communication Communication Communication: No difficulties   Cognition Arousal/Alertness: Awake/alert Behavior During Therapy: WFL for tasks assessed/performed Overall Cognitive Status: Within Functional Limits for tasks assessed                                 General Comments: AxOx4. Pt agreeable to tx session, however  deferred OOB mobility because of performance during PT session              [place] expects to be discharged to:: Private residence Living Arrangements: Spouse/significant other Available Help at Discharge: Other (Comment);Friend(s) (Pt lives with girl friend who has a disability and is unable to provide assistance with mobility) Type of Home: House Home Access: [place]: One level     Bathroom Shower/Tub: Tub only   Biochemist, clinical: Handicapped height     Home Equipment: Grab bars - toilet;Cane - quad;Electric scooter          Prior Functioning/Environment Level of Independence: Needs assistance  Gait / Transfers Assistance Needed: Pt reports using an electric scooter at home and in the community. ADL's / Homemaking Assistance Needed: Pt reports being independent with dressing and reports taking sponge baths at baseline. Pt reports that his girlfriend is able to cook/clean house.   Comments: Pt Ind with amb HH distances with a QC, no fall history, uses electric scooter in community, takes sponge baths        OT Problem List: Decreased strength;Decreased activity tolerance;Decreased range of motion;Decreased coordination;Decreased safety awareness;Decreased knowledge of precautions      OT Treatment/Interventions: Self-care/ADL training;Therapeutic exercise;Energy conservation;DME and/or AE instruction;Therapeutic activities;Patient/family education;Balance training    OT Goals(Current goals can be found in the care plan section) Acute Rehab OT Goals Patient Stated Goal: To get my strength back OT Goal Formulation: With patient Time For Goal Achievement: 04/18/20 Potential to Achieve Goals: Good ADL Goals Pt Will Transfer to Toilet: with min assist;stand pivot transfer;bedside commode Pt Will Perform Toileting - Clothing Manipulation and hygiene: with min assist;sit to/from stand  OT Frequency: Min 1X/week   Barriers to  D/C: Decreased caregiver support             AM-PAC OT "6 Clicks" Daily Activity     Outcome Measure Help from another person eating meals?: None Help from another person taking care of personal grooming?: None Help from another person toileting, which includes using toliet, bedpan, or urinal?: A Lot Help from another person bathing (including washing, rinsing, drying)?: A Lot Help from another person to put on and taking off regular upper body clothing?: A Little Help from another person to put on and taking off regular lower body clothing?: A Lot 6 Click Score: 17   End of Session Nurse Communication: Mobility status  Activity Tolerance: Patient tolerated treatment well Patient left: in bed;with call bell/phone within reach;with bed alarm set  OT Visit Diagnosis: Unsteadiness on feet (R26.81)                Time: [id number] OT Time Calculation (min): 35 min Charges:  OT General Charges $OT Visit: 1 Visit OT Evaluation $OT Eval Moderate Complexity: 1 Mod OT Treatments $Self Care/Home Management : 23-37 mins  Fredirick Maudlin, OTR/L [place]

## 2020-04-05 NOTE — Progress Notes (Addendum)
[facility] Progress Note  Patient: Nathan [name].    SN:[id number]  DOA: 04/04/2020     Date of Service: the patient was seen and examined on 04/05/2020  Chief Complaint  Patient presents with  . Weakness   Brief hospital course: Nathan [name]. is a 70 y.o. male with medical history significant of hypertension, hyperlipidemia, stroke, GERD, atrial fibrillation on Eliquis, sCHF EF of 40-45%, pulmonary hypertension, GI bleeding, CKD-3, tobacco abuse, who presents with weakness for more than 2 days, which is progressively getting worse.  No focal deficit, no neurological symptoms, patient denies any dark stools GI bleeding  ED Course: pt was found to have hemoglobin dropped from 11.7 on 03/24/2018 to 7.4, negative FOBT per ED physician, negative COVID19, CK 41, alcohol level less than 10, stable renal function, WBC 7.8, troponin level 12, potassium 3.4, temperature normal, blood pressure 156/94, heart rate 111, RR 18, oxygen sat 95% on room air.  Chest x-ray negative.  CT head is negative for acute intracranial abnormalities.  Patient is admitted to [place] bed for observation.   Assessment and Plan: Assessment/Plan Principal Problem:   Symptomatic anemia Active Problems:   Chronic systolic heart failure (HCC)   HTN (hypertension)   Tobacco use   Chronic a-fib (HCC)   CVA (cerebral vascular accident) ([place])   Hypokalemia   HLD (hyperlipidemia)   GERD (gastroesophageal reflux disease)   CKD (chronic kidney disease), stage IIIa   Tobacco abuse    Symptomatic anemia: Patient's generalized weakness is likely due to symptomatic anemia.  Patient does not have focal neurodeficit on physical examination.  No signs of infection.  urinalysis Negative.  CT head is negative.  Hemoglobin dropped from 11.7 to 7.4.  Patient is Eliquis for atrial fibrillation.  He denies recent dark stool or rectal bleeding.  Per ED physician, FOBT negative.  There is a possibility that patient may have  episodic GI blood loss.  -s/p Transfuse 1 unit of blood which is ordered by ED physician -anemia panel, iron saturation 8%, B12 194 deficiency and folate 7.3 within normal range Started Venofer 200 mg IV daily for 3 days, start oral supplement with vitamin C on discharge Vitamin B12 1000 mcg IM injection daily for 7 days during hospital stay, start oral supplement on discharge -Repeat H&H and CBC in AM - hold off Eliquis now -PT.OT -Follow GI consult for possible endoluminal exam   Chronic systolic heart failure ([place]): 2D echo on 07/01/2018 showed EF of 40- 45%.  Patient has trace leg edema, no respiratory distress, no pulmonary edema on chest x-ray.  CHF seems to be compensated. -Held spironolactone, Lasix due to Low BP - BNP -->138  HTN (hypertension) -IV hydralazine as needed. -Held Cozaar, metoprolol due to Low BP   Chronic a-fib  -Held Metoprolol due to low BP -Hold Eliquis temporarily due to concerning for GI blood loss  CVA (cerebral vascular accident) ([place]) -Lipitor  Hypokalemia: Potassium 3.4 -Replete potassium -magnesium level wnl  HLD (hyperlipidemia) -Lipitor  GERD (gastroesophageal reflux disease) -Protonix  CKD (chronic kidney disease), stage IIIa: Stable -Follow-up with BMP  Tobacco abuse, Nicotine patch.  Smoking cessation counseling done UDS positive for cocaine, drug abuse abstinence counseling done.   Body mass index is 24.67 kg/m.       Diet: Heart healthy/carb modified DVT Prophylaxis: SCD, pharmacological prophylaxis contraindicated due to possible GI bleeding   Advance goals of care discussion: Full code  Family Communication: family was not present at bedside, at the time of  interview.  The pt provided permission to discuss medical plan with the family. Opportunity was given to ask question and all questions were answered satisfactorily.   Disposition:  Pt is from Home, admitted with symptomatic anemia, still has low blood  pressure, which precludes a safe discharge. Discharge to home, when BP and H&H remains stable, pending PT and OT eval and pending GI eval. Most likely patient will be discharged home tomorrow if remains stable   Subjective: No significant overnight events, patient was admitted with generalized weakness and feels better after blood transfusion.  Denied any GI bleeding or dark stools.  Patient would like to stay and have the GI work-up done during hospital stay.  Patient never had colonoscopy or EGD done in the past Patient's blood pressure was low, RN was advised to check BP and orthostatics PT/ OT eval pending   Physical Exam: General:  alert oriented to time, place, and person.  Appear in no distress, affect appropriate Eyes: PERRLA ENT: Oral Mucosa Clear, moist  Neck: no JVD,  Cardiovascular: S1 and S2 Present, no Murmur,  Respiratory: good respiratory effort, Bilateral Air entry equal and Decreased, no Crackles, no wheezes Abdomen: Bowel Sound present, Soft and no tenderness,  Skin: no rashes Extremities: no Pedal edema, no calf tenderness Neurologic: without any new focal findings Gait not checked due to patient safety concerns  Vitals:   04/05/20 0518 04/05/20 0812 04/05/20 1020 04/05/20 1023  BP:  119/79 109/74 109/72  Pulse:  80 74 75  Resp:  17    Temp:  97.9 F (36.6 C)    TempSrc:      SpO2:  100%    Weight: 94.3 kg     Height:        Intake/Output Summary (Last 24 hours) at 04/05/2020 1205 Last data filed at 04/05/2020 1020 Gross per 24 hour  Intake 1273.75 ml  Output 775 ml  Net 498.75 ml   Filed Weights   04/04/20 1209 04/05/20 0518  Weight: 109.8 kg 94.3 kg    Data Reviewed: I have personally reviewed and interpreted daily labs, tele strips, imagings as discussed above. I reviewed all nursing notes, pharmacy notes, vitals, pertinent old records I have discussed plan of care as described above with RN and patient/family.  CBC: Recent Labs  Lab  04/04/20 1213 04/04/20 1457 04/05/20 0621  WBC 7.8  --  6.1  HGB 7.4* 8.2* 7.9*  HCT 23.4* 25.6* 24.5*  MCV 79.1*  --  79.5*  PLT 381  --  [id number]   Basic Metabolic Panel: Recent Labs  Lab 04/04/20 1213 04/04/20 1457 04/05/20 0621  NA 138  --  138  K 3.4*  --  4.0  CL 100  --  104  CO2 23  --  25  GLUCOSE 106*  --  107*  BUN 32*  --  29*  CREATININE 1.43*  --  1.38*  CALCIUM 9.2  --  8.8*  MG  --  1.9  --     Studies: CT Head Wo Contrast  Result Date: 04/04/2020 CLINICAL DATA:  Stroke suspected, history of stroke, patient found down soiled with urine and feces. EXAM: CT HEAD WITHOUT CONTRAST TECHNIQUE: Contiguous axial images were obtained from the base of the skull through the vertex without intravenous contrast. COMPARISON:  MRI head June 30, 2018 and CT head June 30, 2018. FINDINGS: Brain: Stable moderate diffuse parenchymal atrophy. There is no intracranial mass, hemorrhage, extra-axial fluid collection or midline shift. Similar severe burden  of ischemic white matter disease. Chronic lacunar type infarct in the pons. Vascular: No hyperdense vessel or unexpected calcification. Atherosclerotic calcifications of the carotid and vertebral arteries. Skull: Normal. Negative for fracture or focal lesion. Sinuses/Orbits: No acute finding. Prior left cataract removal. Likely dense cerumen in the bilateral external auditory canals. Other: None IMPRESSION: 1. No acute intracranial abnormality. 2. Stable moderate diffuse parenchymal atrophy and severe ischemic white matter disease. Electronically Signed   By: Dahlia Bailiff MD   On: 04/04/2020 12:57   DG Chest Portable 1 View  Result Date: 04/04/2020 CLINICAL DATA:  weakness EXAM: PORTABLE CHEST 1 VIEW COMPARISON:  07/06/2018 and prior. FINDINGS: No focal consolidation. Mild hypoinflation. No pneumothorax or pleural effusion. Cardiomediastinal silhouette is within normal limits. No acute osseous abnormality. IMPRESSION: No focal airspace  disease. Electronically Signed   By: Primitivo Gauze M.D.   On: 04/04/2020 13:05    Scheduled Meds: . atorvastatin  80 mg Oral q1800  . cyanocobalamin  1,000 mcg Intramuscular Daily  . [START ON 04/06/2020] furosemide  80 mg Oral Daily  . [START ON 04/06/2020] losartan  100 mg Oral Daily  . [START ON 04/06/2020] metoprolol succinate  50 mg Oral Daily  . multivitamin with minerals  1 tablet Oral Daily  . nicotine  21 mg Transdermal Daily  . pantoprazole  40 mg Oral Daily  . [START ON 04/06/2020] spironolactone  25 mg Oral Daily  . tiZANidine  2 mg Oral TID   Continuous Infusions: . iron sucrose 200 mg (04/05/20 1112)   PRN Meds: acetaminophen, hydrALAZINE, ondansetron (ZOFRAN) IV, oxyCODONE-acetaminophen  Time spent: 35 minutes  Author: Val Riles. MD [facility] 04/05/2020 12:05 PM  To reach On-call, see care teams to locate the attending and reach out to them via [email or web address]. If 7PM-7AM, please contact night-coverage If you still have difficulty reaching the attending provider, please page the [facility] (Director on Call) for [facility] on amion for assistance.

## 2020-04-05 NOTE — TOC Initial Note (Addendum)
Transition of Care (TOC) - Initial/Assessment Note    Patient Details  Name: Nathan [name]. MRN: [id number] Date of Birth: Apr 12, 1950  Transition of Care [facility]) CM/SW Contact:    Magnus Ivan, LCSW Phone Number: 04/05/2020, 2:49 PM  Clinical Narrative:          CSW spoke with patient. Patient lives with his girlfriend. PCP is [facility]. Pharmacy is [facility]. Patient has a cane at home. Used HH in the past, unknown agency. No SNF history. Patient uses [place] transportation. Explained SNF recommendation. Patient refusing SNF. Patient said he would be agreeable to [place] ordered through [employer]. Working on finding [place] coverage. Asked MD for orders. Patient's daughter asked for updates from MD, informed patient of request and patient says he would rather daughter not be updated at this time.         Expected Discharge Plan: [place] Barriers to Discharge: Continued Medical Work up   Patient Goals and CMS Choice Patient states their goals for this hospitalization and ongoing recovery are:: home with home health CMS Medicare.gov Compare Post Acute Care list provided to:: Patient Choice offered to / list presented to : Patient  Expected Discharge Plan and Services Expected Discharge Plan: [place]       Living arrangements for the past 2 months: Single Family Home                 DME Arranged: Walker rolling DME Agency: [employer] Date DME Agency Contacted: 04/05/20   Representative spoke with at DME Agency: [facility] Arranged: PT          Prior Living Arrangements/Services Living arrangements for the past 2 months: Single Family Home Lives with:: Significant Other Patient language and need for interpreter reviewed:: Yes Do you feel safe going back to the place where you live?: Yes      Need for Family Participation in Patient Care: Yes (Comment) Care giver support  system in place?: Yes (comment) Current home services: DME Criminal Activity/Legal Involvement Pertinent to Current Situation/Hospitalization: No - Comment as needed  Activities of Daily Living      Permission Sought/Granted Permission sought to share information with : Chartered certified accountant granted to share information with : Yes, Verbal Permission Granted     Permission granted to share info w AGENCY: HH, DME agencies        Emotional Assessment       Orientation: : Oriented to Self,Oriented to Place,Oriented to Situation,Oriented to  Time Alcohol / Substance Use: Not Applicable Psych Involvement: No (comment)  Admission diagnosis:  Weakness [R53.1] Symptomatic anemia [D64.9] Patient Active Problem List   Diagnosis Date Noted  . Symptomatic anemia 04/04/2020  . Hypokalemia 04/04/2020  . HLD (hyperlipidemia) 04/04/2020  . GERD (gastroesophageal reflux disease) 04/04/2020  . CKD (chronic kidney disease), stage IIIa 04/04/2020  . Tobacco abuse 04/04/2020  . CVA (cerebral vascular accident) ([place]) 06/30/2018  . CLE (columnar lined esophagus)   . Stomach irritation   . Acute posthemorrhagic anemia   . Hyperkalemia   . Dilated cardiomyopathy ([place]) 08/26/2017  . Chronic a-fib ([place]) 07/15/2017  . Chronic systolic heart failure ([place]) 07/09/2017  . HTN (hypertension) 07/09/2017  . Tobacco use 07/09/2017  . Atrial flutter ([place]) 06/15/2017  . A-fib ([place]) 06/12/2017  . Atrial flutter with rapid ventricular response ([place]) 04/29/2017  . Elevated troponin 04/29/2017  . AKI (acute kidney injury) ([place]) 04/29/2017  PCP:  [name], MD Pharmacy:   [facility] [address] [place] [address] [place] [address] [place] [address] Phone: [phone] Fax: [place] [address]), [place] - [place]) [place] [address] Phone: [phone] Fax:  [place], [place] [address] [place] [address] Phone: [phone] Fax: [phone]     Social Determinants of Health (SDOH) Interventions    Readmission Risk Interventions No flowsheet data found.

## 2020-04-05 NOTE — Evaluation (Signed)
Physical Therapy Evaluation Patient Details Name: Nathan [name]. MRN: [id number] DOB: 08-01-1950 Today's Date: 04/05/2020   History of Present Illness  Per MD notes: Pt is a 70 y.o. male with medical history significant of hypertension, hyperlipidemia, stroke, GERD, atrial fibrillation on Eliquis, sCHF EF of 40-45%, pulmonary hypertension, GI bleeding, CKD-3, tobacco abuse, who presents with weakness.  MD assessment includes: Symptomatic anemia and hypokalemia.    Clinical Impression  Pt was pleasant and motivated to participate during the session but ultimately was very limited with all functional mobility.  Pt required extensive assist with bed mobility tasks and was unable to come to standing even from an elevated EOB with +2 Max A.  Pt presented with significant LLE weakness and although pt has a h/o CVA resulting in L hemiplegia pt reports that his current LLE weakness is not baseline.  Pt presented with significant loss of function compared to his stated baseline where he was Mod Ind with amb with a QC in the home and could perform all of his ADLs independently.  Pt will benefit from PT services in a SNF setting upon discharge to safely address deficits listed in patient problem list for decreased caregiver assistance and eventual return to PLOF.      Follow Up Recommendations SNF    Equipment Recommendations  Rolling walker with 5" wheels    Recommendations for Other Services       Precautions / Restrictions Precautions Precautions: Fall Restrictions Weight Bearing Restrictions: No      Mobility  Bed Mobility Overal bed mobility: Needs Assistance Bed Mobility: Supine to Sit;Sit to Supine     Supine to sit: Mod assist Sit to supine: Mod assist   General bed mobility comments: Mod A for LLE and trunk control    Transfers                 General transfer comment: Pt unable to stand from an elevated surface even with +2 Max A, max multi-modal cues for  sequencing  Ambulation/Gait                Stairs            Wheelchair Mobility    Modified Rankin (Stroke Patients Only)       Balance Overall balance assessment: Needs assistance Sitting-balance support: Feet supported;Single extremity supported Sitting balance-Leahy Scale: Good                                       Pertinent Vitals/Pain Pain Assessment: 0-10 Pain Score: 8  Pain Location: L ankle, lower leg Pain Descriptors / Indicators: Aching;Sore Pain Intervention(s): Premedicated before session;Monitored during session;Patient requesting pain meds-RN notified    Home Living Family/patient expects to be discharged to:: Private residence Living Arrangements: Spouse/significant other Available Help at Discharge: Other (Comment);Friend(s) (Pt lives with girl friend who is disabled and unable to provide assistance) Type of Home: House Home Access: Ramped entrance     Home Layout: One level Home Equipment: Grab bars - toilet;Cane - quad;Electric scooter;Other (comment) (HW)      Prior Function Level of Independence: Independent with assistive device(s)         Comments: Pt Ind with amb HH distances with a QC, no fall history, uses electric scooter in community, takes sponge baths     Hand Dominance        Extremity/Trunk Assessment   Upper Extremity  Assessment Upper Extremity Assessment: Generalized weakness;LUE deficits/detail;Defer to OT evaluation LUE Deficits / Details: Chronic weakness from CVA    Lower Extremity Assessment Lower Extremity Assessment: LLE deficits/detail;Generalized weakness LLE Deficits / Details: Trace L knee flex/ext and ankle DF/PF, hip flex <3/5; chronic weakness from CVA but per pt report LLE is "weaker than normal"       Communication   Communication: No difficulties  Cognition Arousal/Alertness: Awake/alert Behavior During Therapy: WFL for tasks assessed/performed Overall Cognitive Status:  Within Functional Limits for tasks assessed                                        General Comments      Exercises Total Joint Exercises Ankle Circles/Pumps: AROM;Strengthening;Both;10 reps;15 reps (minimal AROM) Quad Sets: Strengthening;Both;10 reps;15 reps (Trace contraction on the LLE) Gluteal Sets: Strengthening;Both;10 reps Heel Slides: AAROM;Left;10 reps;5 reps Hip ABduction/ADduction: AAROM;Left;5 reps;10 reps Straight Leg Raises: AAROM;Left;5 reps;10 reps Long Arc Quad: Left;PROM;10 reps;5 reps;AROM (Little to no contraction on the LLE)   Assessment/Plan    PT Assessment Patient needs continued PT services  PT Problem List Decreased strength;Decreased activity tolerance;Decreased balance;Decreased mobility;Decreased knowledge of use of DME;Pain       PT Treatment Interventions DME instruction;Gait training;Functional mobility training;Therapeutic activities;Therapeutic exercise;Balance training;Patient/family education    PT Goals (Current goals can be found in the Care Plan section)  Acute Rehab PT Goals Patient Stated Goal: To get my strength back PT Goal Formulation: With patient Time For Goal Achievement: 04/18/20 Potential to Achieve Goals: Fair    Frequency Min 2X/week   Barriers to discharge Inaccessible home environment;Decreased caregiver support      Co-evaluation               AM-PAC PT "6 Clicks" Mobility  Outcome Measure Help needed turning from your back to your side while in a flat bed without using bedrails?: A Lot Help needed moving from lying on your back to sitting on the side of a flat bed without using bedrails?: A Lot Help needed moving to and from a bed to a chair (including a wheelchair)?: Total Help needed standing up from a chair using your arms (e.g., wheelchair or bedside chair)?: Total Help needed to walk in hospital room?: Total Help needed climbing 3-5 steps with a railing? : Total 6 Click Score: 8    End  of Session Equipment Utilized During Treatment: Gait belt Activity Tolerance: Patient tolerated treatment well Patient left: in bed;with call bell/phone within reach;with bed alarm set Nurse Communication: Mobility status PT Visit Diagnosis: Muscle weakness (generalized) (M62.81);Difficulty in walking, not elsewhere classified (R26.2);Pain Pain - Right/Left: Left Pain - part of body: Ankle and joints of foot    Time: 1055-1130 PT Time Calculation (min) (ACUTE ONLY): 35 min   Charges:   PT Evaluation $PT Eval Moderate Complexity: 1 Mod PT Treatments $Therapeutic Exercise: 8-22 mins        D. Scott Constanza Mincy PT, DPT 04/05/20, 2:01 PM

## 2020-04-05 NOTE — Consult Note (Signed)
Consultation  Referring Provider:     Dr Dwyane Dee Admit date  04/04/20 Consult date        04/05/20 Reason for Consultation:  Symptomatic anemia            HPI:   Nathan [name]. is a 70 y.o. male  history significant ofhypertension, hyperlipidemia, stroke, GERD, atrial fibrillation on Eliquis,sCHFEF of 40-45%, pulmonary hypertension, GI bleeding, CKD-3, tobacco abuse He was admitted with anemia exacerbation/increasing weakness- hgb 7.4- prior  count 11.7.  Iron & iron saturation low, b12 low. received 1u prbc. venofer and b12 injections have been ordered. Last hgb 7.9. wbc/platelets normal. Pt 17.3/ inr 1/5.  note he testes + for cocaine 04/04/20, etoh negative Eliquis on hold, last dose was yesterday. States he last used cocaine Monday 04/03/20.  States he has some left leg and shoulder discomfort. .denies abdominal pain, dysphagia, gerd. NVD, bowel habit changes, melena/hematochezia, and all other GI concerns. Denies any known family history of colorectal cancer, other GI related malignancies. He denies any personal history of liver disease. Last lvf 2020 40-45%- Dr Rockey Situ- follows with him for afib/chf as well as CHF clinic.   PREVIOUS ENDOSCOPIES:            EGD 02/09/18- Dr Chanetta Marshall- erythematous esophagus, gastritis, normal duodnenum- there were some erosions in the esophagus and stomach, possibly from pill or NGT. There were no esophageal varices. There was some possible barretts   Past Medical History:  Diagnosis Date  . Atrial flutter ([place])    a. s/p TEE/DCCV 04/2017; b. 06/2017 s/p RFCA; c. CHADS2VASc => 5 (CHF, HTN, age x 1, CVA)-->noncompliant with Xarelto.  . Chronic combined systolic and diastolic CHF (congestive heart failure) ([place])    a. TTE 2/19: EF 20-25%, diffuse HK; b. TTE 3/19: EF 20-25%, diffuse HK; c. 08/2017 Echo: EF 35-40%, diff HK. Gr1 DD; d. 06/2018 Echo: EF 40-45%, DD. Neg bubble study.  . CKD (chronic kidney disease), stage II-III   . Essential hypertension   . GI  bleed    a.  Hemorrhagic shock in 11/19 secondary to erosive gastropathy and Barrett's esophagus requiring multiple units PRBCs; b. Cleared to resume OAC->pt did not.  Marland Kitchen NICM (nonischemic cardiomyopathy) ([place])    a. 04/2017: Echo EF 20-25%; b. TTE 3/19: EF 20-25%; c. 07/2017 Cath: min irregs; d. 08/2017 Echo: EF 35-40%, diff HK; e. 06/2018 Echo: EF 40-45%, DD.  Marland Kitchen Noncompliance with medications   . PAF (paroxysmal atrial fibrillation) ([place])    a. 07/2017 afib->broke w/ IV amio->converted to oral bb due to prior intol to oral amio.  . Pulmonary hypertension ([place])   . Stroke [facility] [place])    a. 06/2018 MRI/A Brain: R paramedian pons late acute/early subacute infarct, 69m. No assoc hemorrhage or mass effect.  Sev chronic microvascular isch changtes and mod voluem loss. No large vessel occlusion, aneurysm, or significant stenosis.    Past Surgical History:  Procedure Laterality Date  . A-FLUTTER ABLATION N/A 06/16/2017   Procedure: A-FLUTTER ABLATION;  Surgeon: TEvans Lance MD;  [facility];  Service: Cardiovascular;  Laterality: N/A;  . CARDIAC CATHETERIZATION    . CARDIOVERSION N/A 05/01/2017   Procedure: CARDIOVERSION;  Surgeon: GMinna Merritts MD;  Location: [facility] ORS;  Service: Cardiovascular;  Laterality: N/A;  . CORONARY ANGIOPLASTY    . ESOPHAGOGASTRODUODENOSCOPY (EGD) WITH PROPOFOL N/A 02/09/2018   Procedure: ESOPHAGOGASTRODUODENOSCOPY (EGD) WITH PROPOFOL;  Surgeon: TVirgel Manifold MD;  Location: [facility] ENDOSCOPY;  Service: Endoscopy;  Laterality: N/A;  .  RIGHT/LEFT HEART CATH AND CORONARY ANGIOGRAPHY N/A 07/18/2017   Procedure: RIGHT/LEFT HEART CATH AND CORONARY ANGIOGRAPHY;  Surgeon: [name], MD;  Location: [place] [facility];  Service: Cardiovascular;  Laterality: N/A;  . TEE WITHOUT CARDIOVERSION N/A 05/01/2017   Procedure: TRANSESOPHAGEAL ECHOCARDIOGRAM (TEE);  Surgeon: [name], MD;  Location: [facility] ORS;  Service: Cardiovascular;  Laterality: N/A;  .  TEE WITHOUT CARDIOVERSION N/A 06/16/2017   Procedure: TRANSESOPHAGEAL ECHOCARDIOGRAM (TEE);  Surgeon: [name], MD;  Location: [facility] ENDOSCOPY;  Service: Cardiovascular;  Laterality: N/A;    Family History  Problem Relation Age of Onset  . Alzheimer's disease Mother   . Alzheimer's disease Father      Social History   Tobacco Use  . Smoking status: Former Smoker    Quit date: 04/21/2017    Years since quitting: 2.9  . Smokeless tobacco: Never Used  Vaping Use  . Vaping Use: Never used  Substance Use Topics  . Alcohol use: Yes    Alcohol/week: 3.0 standard drinks    Types: 3 Cans of beer per week    Comment: Drink a half of a 40oz beer every day, drank heavily in the past  . Drug use: Never    Prior to Admission medications   Medication Sig Start Date End Date Taking? Authorizing Provider  atorvastatin (LIPITOR) 80 MG tablet TAKE 1 TABLET BY MOUTH ONCE DAILY Patient taking differently: Take 80 mg by mouth daily. TAKE 1 TABLET BY MOUTH ONCE DAILY 09/10/19  Yes Theora Gianotti, NP  ELIQUIS 5 MG TABS tablet Take 1 tablet by mouth twice daily Patient taking differently: Take 5 mg by mouth 2 (two) times daily. 02/28/20  Yes Gollan, Kathlene November, MD  furosemide (LASIX) 80 MG tablet Take 80 mg by mouth daily. 03/04/20  Yes [provider]  losartan (COZAAR) 100 MG tablet Take 1 tablet by mouth once daily Patient taking differently: Take 100 mg by mouth daily. 01/14/20  Yes Gollan, Kathlene November, MD  metoprolol succinate (TOPROL-XL) 50 MG 24 hr tablet TAKE 1 TABLET BY MOUTH ONCE DAILY. TAKE WITH OR IMMEDIATELY FOLLOWING A MEAL. Patient taking differently: Take 50 mg by mouth daily. TAKE 1 TABLET BY MOUTH ONCE DAILY. TAKE WITH OR IMMEDIATELY FOLLOWING A MEAL. 06/29/19  Yes Gollan, Kathlene November, MD  pantoprazole (PROTONIX) 40 MG tablet TAKE 1 TABLET BY MOUTH IN THE MORNING AT  6  AM Patient taking differently: Take 40 mg by mouth daily. TAKE 1 TABLET BY MOUTH IN THE MORNING AT   6  AM 12/08/19  Yes Gollan, Kathlene November, MD  spironolactone (ALDACTONE) 25 MG tablet Take 1 tablet (25 mg total) by mouth daily. 12/08/19  Yes Gollan, Kathlene November, MD  tiZANidine (ZANAFLEX) 2 MG tablet Take 2 mg by mouth 3 (three) times daily. 03/01/20   [provider]    Current Facility-Administered Medications  Medication Dose Route Frequency Provider Last Rate Last Admin  . acetaminophen (TYLENOL) tablet 650 mg  650 mg Oral Q6H PRN Ivor Costa, MD      . atorvastatin (LIPITOR) tablet 80 mg  80 mg Oral q1800 Hart Robinsons A, RPH   80 mg at 04/05/20 0140  . cyanocobalamin ((VITAMIN B-12)) injection 1,000 mcg  1,000 mcg Intramuscular Daily Val Riles, MD   1,000 mcg at 04/05/20 0906  . [START ON 04/06/2020] furosemide (LASIX) tablet 80 mg  80 mg Oral Daily Val Riles, MD      . hydrALAZINE (APRESOLINE) injection 5 mg  5 mg Intravenous Q2H PRN Ivor Costa, MD      . iron sucrose (VENOFER) 200 mg in sodium chloride 0.9 % 100 mL IVPB  200 mg Intravenous Q24H Val Riles, MD 440 mL/hr at 04/05/20 1112 200 mg at 04/05/20 1112  . [START ON 04/06/2020] losartan (COZAAR) tablet 100 mg  100 mg Oral Daily Val Riles, MD      . Derrill Memo ON 04/06/2020] metoprolol succinate (TOPROL-XL) 24 hr tablet 50 mg  50 mg Oral Daily Val Riles, MD      . multivitamin with minerals tablet 1 tablet  1 tablet Oral Daily Val Riles, MD   1 tablet at 04/05/20 0900  . nicotine (NICODERM CQ - dosed in mg/24 hours) patch 21 mg  21 mg Transdermal Daily Ivor Costa, MD      . ondansetron [facility]) injection 4 mg  4 mg Intravenous Q8H PRN Ivor Costa, MD      . oxyCODONE-acetaminophen (PERCOCET/ROXICET) 5-325 MG per tablet 1 tablet  1 tablet Oral Q4H PRN Ivor Costa, MD   1 tablet at 04/05/20 0900  . pantoprazole (PROTONIX) EC tablet 40 mg  40 mg Oral Daily Ivor Costa, MD   40 mg at 04/05/20 0901  . [START ON 04/06/2020] spironolactone (ALDACTONE) tablet 25 mg  25 mg Oral Daily Val Riles, MD      . tiZANidine  (ZANAFLEX) tablet 2 mg  2 mg Oral TID Ivor Costa, MD   2 mg at 04/05/20 0900  . Vitamin D (Ergocalciferol) (DRISDOL) capsule 50,000 Units  50,000 Units Oral Q7 days Val Riles, MD        Allergies as of 04/04/2020  . (No Known Allergies)     Review of Systems:    All systems reviewed and negative except where noted in HPI.     Physical Exam:  Vital signs in last 24 hours: Temp:  [97.8 F (36.6 C)-98.3 F (36.8 C)] 98.2 F (36.8 C) (01/19 1214) Pulse Rate:  [70-104] 73 (01/19 1214) Resp:  [14-18] 18 (01/19 1214) BP: (101-151)/(56-110) 106/71 (01/19 1214) SpO2:  [97 %-100 %] 100 % (01/19 1214) Weight:  [94.3 kg] 94.3 kg (01/19 0518)   General:   Pleasant man in NAD Head:  Normocephalic and atraumatic. Eyes:   No icterus.   Conjunctiva pink. Ears:  Normal auditory acuity. Mouth: Mucosa pink moist, no lesions. Neck:  Supple; no masses felt Lungs:  Respirations even and unlabored. Lungs clear to auscultation bilaterally.   No wheezes, crackles, or rhonchi.  Heart:  S1S2, RRR, no MRG. No edema. Abdomen:   Flat, soft, nondistended, nontender. Normal bowel sounds. No appreciable masses or hepatomegaly. No rebound signs or other peritoneal signs. Rectal:  Not performed.  Msk:  MAEW x4, No clubbing or cyanosis. Strength 5/5/ upper extremities, 4/5 lower. Symmetrical without gross deformities. Neurologic:  Alert and  oriented x4;  Cranial nerves II-XII intact.  Skin:  Warm, dry, pink without significant lesions or rashes. Psych:  Alert and cooperative. Normal affect.  LAB RESULTS: Recent Labs    04/04/20 1213 04/04/20 1457 04/05/20 0621  WBC 7.8  --  6.1  HGB 7.4* 8.2* 7.9*  HCT 23.4* 25.6* 24.5*  PLT 381  --  328   BMET Recent Labs    04/04/20 1213 04/05/20 0621  NA 138 138  K 3.4* 4.0  CL 100 104  CO2 23 25  GLUCOSE 106* 107*  BUN 32* 29*  CREATININE 1.43* 1.38*  CALCIUM 9.2 8.8*   LFT Recent Labs  04/04/20 1213  PROT 8.4*  ALBUMIN 2.9*  AST 30   ALT 16  ALKPHOS 68  BILITOT 0.7   PT/INR Recent Labs    04/04/20 2014  LABPROT 17.3*  INR 1.5*    STUDIES: CT Head Wo Contrast  Result Date: 04/04/2020 CLINICAL DATA:  Stroke suspected, history of stroke, patient found down soiled with urine and feces. EXAM: CT HEAD WITHOUT CONTRAST TECHNIQUE: Contiguous axial images were obtained from the base of the skull through the vertex without intravenous contrast. COMPARISON:  MRI head June 30, 2018 and CT head June 30, 2018. FINDINGS: Brain: Stable moderate diffuse parenchymal atrophy. There is no intracranial mass, hemorrhage, extra-axial fluid collection or midline shift. Similar severe burden of ischemic white matter disease. Chronic lacunar type infarct in the pons. Vascular: No hyperdense vessel or unexpected calcification. Atherosclerotic calcifications of the carotid and vertebral arteries. Skull: Normal. Negative for fracture or focal lesion. Sinuses/Orbits: No acute finding. Prior left cataract removal. Likely dense cerumen in the bilateral external auditory canals. Other: None IMPRESSION: 1. No acute intracranial abnormality. 2. Stable moderate diffuse parenchymal atrophy and severe ischemic white matter disease. Electronically Signed   By: Dahlia Bailiff MD   On: 04/04/2020 12:57   DG Chest Portable 1 View  Result Date: 04/04/2020 CLINICAL DATA:  weakness EXAM: PORTABLE CHEST 1 VIEW COMPARISON:  07/06/2018 and prior. FINDINGS: No focal consolidation. Mild hypoinflation. No pneumothorax or pleural effusion. Cardiomediastinal silhouette is within normal limits. No acute osseous abnormality. IMPRESSION: No focal airspace disease. Electronically Signed   By: Primitivo Gauze M.D.   On: 04/04/2020 13:05       Impression / Plan:   1. IDA/weakness- no GI symptoms presently and no melena/was heme neg. Agree with continuing PPI. Did discuss his cocaine use with him and sequelae- recommend against. Do think he would benefit from  egd/colonoscopy for luminal eval. Will need to time this with his eliquis hold and cocaine clearance- after review with Dr Haig Prophet, will plan for Friday as clinically feasible.  Thank you very much for this consult. These services were provided by Stephens November, NP-C, in collaboration with [name], MD, with whom I have discussed this patient in full.   Stephens November, NP-C

## 2020-04-06 DIAGNOSIS — D509 Iron deficiency anemia, unspecified: Secondary | ICD-10-CM | POA: Diagnosis not present

## 2020-04-06 DIAGNOSIS — D649 Anemia, unspecified: Secondary | ICD-10-CM | POA: Diagnosis not present

## 2020-04-06 LAB — BASIC METABOLIC PANEL
Anion gap: 9 (ref 5–15)
BUN: 26 mg/dL — ABNORMAL HIGH (ref 8–23)
CO2: 25 mmol/L (ref 22–32)
Calcium: 8.7 mg/dL — ABNORMAL LOW (ref 8.9–10.3)
Chloride: 104 mmol/L (ref 98–111)
Creatinine, Ser: 1.3 mg/dL — ABNORMAL HIGH (ref 0.61–1.24)
GFR, Estimated: 59 mL/min — ABNORMAL LOW (ref >60)
Glucose, Bld: 103 mg/dL — ABNORMAL HIGH (ref 70–99)
Potassium: 3.8 mmol/L (ref 3.5–5.1)
Sodium: 138 mmol/L (ref 135–145)

## 2020-04-06 LAB — CBC
HCT: 23 % — ABNORMAL LOW (ref 39.0–52.0)
Hemoglobin: 7.5 g/dL — ABNORMAL LOW (ref 13.0–17.0)
MCH: 26.2 pg (ref 26.0–34.0)
MCHC: 32.6 g/dL (ref 30.0–36.0)
MCV: 80.4 fL (ref 80.0–100.0)
Platelets: 326 10*3/uL (ref 150–400)
RBC: 2.86 MIL/uL — ABNORMAL LOW (ref 4.22–5.81)
RDW: 16.3 % — ABNORMAL HIGH (ref 11.5–15.5)
WBC: 7.6 10*3/uL (ref 4.0–10.5)
nRBC: 0 % (ref 0.0–0.2)

## 2020-04-06 LAB — HEMOGLOBIN AND HEMATOCRIT, BLOOD
HCT: 26.7 % — ABNORMAL LOW (ref 39.0–52.0)
Hemoglobin: 8.5 g/dL — ABNORMAL LOW (ref 13.0–17.0)

## 2020-04-06 LAB — PHOSPHORUS: Phosphorus: 2.6 mg/dL (ref 2.5–4.6)

## 2020-04-06 LAB — GLUCOSE, CAPILLARY: Glucose-Capillary: 76 mg/dL (ref 70–99)

## 2020-04-06 LAB — MAGNESIUM: Magnesium: 1.8 mg/dL (ref 1.7–2.4)

## 2020-04-06 MED ORDER — BISACODYL 10 MG RE SUPP: 10.0000 mg | Freq: Every day | RECTAL | Status: DC | PRN

## 2020-04-06 MED ORDER — PEG 3350-KCL-NA BICARB-NACL 420 G PO SOLR
4000.0000 mL | Freq: Once | ORAL | Status: AC
  Administered 2020-04-06: 4000 mL via ORAL
  Filled 2020-04-06: qty 4000

## 2020-04-06 MED ORDER — POLYETHYLENE GLYCOL 3350 17 G PO PACK
17.0000 g | PACK | Freq: Every day | ORAL | Status: DC
  Administered 2020-04-06: 17 g via ORAL
  Filled 2020-04-06 (×2): qty 1

## 2020-04-06 MED ORDER — HYDROMORPHONE HCL 1 MG/ML IJ SOLN
0.5000 mg | Freq: Once | INTRAMUSCULAR | Status: AC
Start: 2020-04-06 — End: 2020-04-06
  Administered 2020-04-06: 0.5 mg via INTRAVENOUS
  Filled 2020-04-06: qty 1

## 2020-04-06 MED ORDER — BISACODYL 5 MG PO TBEC: 10.0000 mg | DELAYED_RELEASE_TABLET | Freq: Every day | ORAL | Status: DC | PRN

## 2020-04-06 NOTE — Progress Notes (Signed)
[facility] Progress Note  Patient: Nathan [name].    SN:[id number]  DOA: 04/04/2020     Date of Service: the patient was seen and examined on 04/06/2020  Chief Complaint  Patient presents with  . Weakness   Brief hospital course: Nathan Trude. is a 70 y.o. male with medical history significant of hypertension, hyperlipidemia, stroke, GERD, atrial fibrillation on Eliquis, sCHF EF of 40-45%, pulmonary hypertension, GI bleeding, CKD-3, tobacco abuse, who presents with weakness for more than 2 days, which is progressively getting worse.  No focal deficit, no neurological symptoms, patient denies any dark stools GI bleeding  ED Course: pt was found to have hemoglobin dropped from 11.7 on 03/24/2018 to 7.4, negative FOBT per ED physician, negative COVID19, CK 41, alcohol level less than 10, stable renal function, WBC 7.8, troponin level 12, potassium 3.4, temperature normal, blood pressure 156/94, heart rate 111, RR 18, oxygen sat 95% on room air.  Chest x-ray negative.  CT head is negative for acute intracranial abnormalities.  Patient is admitted to [facility] bed for observation.   Assessment and Plan: Assessment/Plan Principal Problem:   Symptomatic anemia Active Problems:   Chronic systolic heart failure (HCC)   HTN (hypertension)   Tobacco use   Chronic a-fib (HCC)   CVA (cerebral vascular accident) ([place])   Hypokalemia   HLD (hyperlipidemia)   GERD (gastroesophageal reflux disease)   CKD (chronic kidney disease), stage IIIa   Tobacco abuse    Symptomatic anemia: Patient's generalized weakness is likely due to symptomatic anemia.  Patient does not have focal neurodeficit on physical examination.  No signs of infection.  urinalysis Negative.  CT head is negative.  Hemoglobin dropped from 11.7 to 7.4.  Patient is Eliquis for atrial fibrillation.  He denies recent dark stool or rectal bleeding.  Per ED physician, FOBT negative.  There is a possibility that patient may have  episodic GI blood loss. -s/p Transfuse 1 unit of blood which is ordered by ED physician -anemia panel, iron saturation 8%, B12 194 deficiency and folate 7.3 within normal range Started Venofer 200 mg IV daily for 3 days, start oral supplement with vitamin C on discharge Vitamin B12 1000 mcg IM injection daily for 7 days during hospital stay, start oral supplement on discharge -Repeat H&H and CBC in AM - hold off Eliquis now -PT.OT -GI consulted recommended EGD and colonoscopy most likely tomorrow on Friday   Chronic systolic heart failure ([place]): 2D echo on 07/01/2018 showed EF of 40- 45%.  Patient has trace leg edema, no respiratory distress, no pulmonary edema on chest x-ray.  CHF seems to be compensated. -Held spironolactone, Lasix due to Low BP - BNP -->138  HTN (hypertension) -IV hydralazine as needed. -Held Cozaar, metoprolol due to Low BP   Chronic a-fib  -Held Metoprolol due to low BP -Hold Eliquis temporarily due to concerning for GI blood loss  CVA (cerebral vascular accident) ([place]) -Lipitor  Hypokalemia: Potassium 3.4 -Replete potassium -magnesium level wnl  HLD (hyperlipidemia) -Lipitor  GERD (gastroesophageal reflux disease) -Protonix  CKD (chronic kidney disease), stage IIIa: Stable -Follow-up with BMP  Tobacco abuse, Nicotine patch.  Smoking cessation counseling done UDS positive for cocaine, drug abuse abstinence counseling done.  Vitamin D deficiency, started vitamin D 50k units p.o. weekly for total 8 weeks.  Follow with PCP and repeat vitamin D level after 3 to 6 months.  PT and OT eval done, recommended SNF placement   Body mass index is 24.67 kg/m.  Diet: Heart healthy/carb modified DVT Prophylaxis: SCD, pharmacological prophylaxis contraindicated due to possible GI bleeding   Advance goals of care discussion: Full code  Family Communication: family was not present at bedside, at the time of interview.  The pt provided  permission to discuss medical plan with the family. Opportunity was given to ask question and all questions were answered satisfactorily.   Disposition:  Pt is from Home, admitted with symptomatic anemia, still has low blood pressure, which precludes a safe discharge. Discharge to home, when BP and H&H remains stable, GI consulted, recommended EGD and colonoscopy tomorrow a.m. PT/OT eval done recommended SNF placement    Subjective: No significant overnight events, patient denies any dizziness, no shortness of breath.  Have not walked nonambulatory yet. No active GI bleeding Seen by GI and agreed for the procedure EGD and colonoscopy tomorrow a.m. Patient refused orthostatic vital signs as per RN   Physical Exam: General:  alert oriented to time, place, and person.  Appear in no distress, affect appropriate Eyes: PERRLA ENT: Oral Mucosa Clear, moist  Neck: no JVD,  Cardiovascular: S1 and S2 Present, no Murmur,  Respiratory: good respiratory effort, Bilateral Air entry equal and Decreased, no Crackles, no wheezes Abdomen: Bowel Sound present, Soft and no tenderness,  Skin: no rashes Extremities: no Pedal edema, no calf tenderness Neurologic: without any new focal findings Gait not checked due to patient safety concerns  Vitals:   04/06/20 0349 04/06/20 0617 04/06/20 0814 04/06/20 1221  BP: (!) 94/51  132/64 112/75  Pulse: 66  (!) 102 84  Resp: '18  20 20  '$ Temp: (!) 97.5 F (36.4 C)  98.1 F (36.7 C) 98.9 F (37.2 C)  TempSrc: Oral  Oral Oral  SpO2: 97%  100% 100%  Weight:  97.1 kg    Height:        Intake/Output Summary (Last 24 hours) at 04/06/2020 1446 Last data filed at 04/06/2020 1400 Gross per 24 hour  Intake 720 ml  Output 550 ml  Net 170 ml   Filed Weights   04/04/20 1209 04/05/20 0518 04/06/20 0617  Weight: 109.8 kg 94.3 kg 97.1 kg    Data Reviewed: I have personally reviewed and interpreted daily labs, tele strips, imagings as discussed above. I reviewed  all nursing notes, pharmacy notes, vitals, pertinent old records I have discussed plan of care as described above with RN and patient/family.  CBC: Recent Labs  Lab 04/04/20 1213 04/04/20 1457 04/05/20 0621 04/06/20 0431  WBC 7.8  --  6.1 7.6  HGB 7.4* 8.2* 7.9* 7.5*  HCT 23.4* 25.6* 24.5* 23.0*  MCV 79.1*  --  79.5* 80.4  PLT 381  --  328 [id number]   Basic Metabolic Panel: Recent Labs  Lab 04/04/20 1213 04/04/20 1457 04/05/20 0621 04/06/20 0431  NA 138  --  138 138  K 3.4*  --  4.0 3.8  CL 100  --  104 104  CO2 23  --  25 25  GLUCOSE 106*  --  107* 103*  BUN 32*  --  29* 26*  CREATININE 1.43*  --  1.38* 1.30*  CALCIUM 9.2  --  8.8* 8.7*  MG  --  1.9  --  1.8  PHOS  --   --   --  2.6    Studies: No results found.  Scheduled Meds: . atorvastatin  80 mg Oral q1800  . cyanocobalamin  1,000 mcg Intramuscular Daily  . multivitamin with minerals  1 tablet Oral Daily  .  nicotine  21 mg Transdermal Daily  . pantoprazole  40 mg Oral Daily  . polyethylene glycol  17 g Oral Daily  . tiZANidine  2 mg Oral TID  . Vitamin D (Ergocalciferol)  50,000 Units Oral Q7 days   Continuous Infusions: . iron sucrose 200 mg (04/06/20 0831)   PRN Meds: acetaminophen, bisacodyl, bisacodyl, hydrALAZINE, ondansetron (ZOFRAN) IV, oxyCODONE-acetaminophen  Time spent: 35 minutes  Author: Val Riles. MD [facility] 04/06/2020 2:46 PM  To reach On-call, see care teams to locate the attending and reach out to them via [email or web address]. If 7PM-7AM, please contact night-coverage If you still have difficulty reaching the attending provider, please page the [facility] (Director on Call) for [facility] on amion for assistance.

## 2020-04-06 NOTE — Care Plan (Signed)
Patient has been eating solids today but thankfully, dinner tray was stopped. He needs to be on clear liquids for the rest of the day and npo at midnight except for prep.  Raylene Miyamoto MD, MPH [place]

## 2020-04-06 NOTE — Progress Notes (Signed)
Physical Therapy Treatment Patient Details Name: Nathan [name]. MRN: [id number] DOB: 09-07-1950 Today's Date: 04/06/2020    History of Present Illness Per MD notes: Pt is a 70 y.o. male with medical history significant of hypertension, hyperlipidemia, stroke, GERD, atrial fibrillation on Eliquis, sCHF EF of 40-45%, pulmonary hypertension, GI bleeding, CKD-3, tobacco abuse, who presents with weakness.  MD assessment includes: Symptomatic anemia and hypokalemia.    PT Comments    Pt declining OOB mobility today due to fatigue, but was agreeable for supine therex. Due to generalized weakness, pt required increased assistance for LLE facilitation, demonstrating decreased mm activation with fatigue; most notably with 1/5 strength at L anterior tibialis. Pt LLE contracted and resting in hip flexion/ER and knee flexion and was unable to tolerate neutral hip/knee positioning for therex. Pt educated regarding benefits of participation with therapy and appropriate positioning for long-term gains; he verbalized understanding. At end of session pt's LLE was positioned into hip IR with decreased hip/knee flexion, and will continue to benefit from LLE neutral positioning. Pt with improved pain levels post session. Pt will continue to benefit from skilled acute PT services to address deficits for return to baseline function. Will continue to recommend SNF at [place].    Follow Up Recommendations  SNF     Equipment Recommendations  Rolling walker with 5" wheels    Recommendations for Other Services       Precautions / Restrictions Precautions Precautions: Fall Restrictions Weight Bearing Restrictions: No    Mobility  Bed Mobility               General bed mobility comments: Pt declining OOB mobility but is agreeable for supine therex  Transfers                    Ambulation/Gait                    Balance       Sitting balance - Comments: Pt declining OOB mobility but  agreeable for supine therex                                    Cognition Arousal/Alertness: Awake/alert Behavior During Therapy: WFL for tasks assessed/performed Overall Cognitive Status: Within Functional Limits for tasks assessed                                 General Comments: Pt initially alert and able to follow 100% of simple 2-step commands; intermittent lethary noted towards end of session due to fatigue.      Exercises Total Joint Exercises Ankle Circles/Pumps: AROM;Strengthening;Both;10 reps (+1/5 anterior tib strength noted LLE) Quad Sets: Strengthening;Both;10 reps;15 reps (Trace contraction on the LLE) Gluteal Sets: Strengthening;Both;10 reps Towel Squeeze: AROM;Both;10 reps (isometric hip ADD; performed with LLE in ER due to increased pain with neutral positioning) Heel Slides: Strengthening;AAROM;Both;10 reps (performed with LLE in ER due to increased pain with neutral positioning) Hip ABduction/ADduction: Strengthening;AAROM;Both;10 reps (performed with LLE in ER due to increased pain with neutral positioning) Other Exercises Other Exercises: Pt declining OOB mobility due to fatigue but is agreeable for supine therex. Pt able to perform x10 BLE exercises with AAROM including: quad sets, ankle pumps, heel slides, isometric hip ADD, hip ABD/ADD, and glute sets. LLE resting in contracted hip ER with hip/knee flexion; pt unable to tolerate neutral  positioning, therefore, many exercises performed in contracted position. 1/5 anterior tib strength noted on LLE during ankle pumps. Other Exercises: Pt educated regarding: PT POC/role, benefits of mobility/exercise, appropriate positioning for long-term gain; he verbalized understanding.        Pertinent Vitals/Pain Pain Assessment: Faces Faces Pain Scale: Hurts even more Pain Location: L ankle/leg Pain Descriptors / Indicators: Aching;Sore Pain Intervention(s): Monitored during  session;Repositioned;Limited activity within patient's tolerance           PT Goals (current goals can now be found in the care plan section) Acute Rehab PT Goals Patient Stated Goal: To get my strength back PT Goal Formulation: With patient Time For Goal Achievement: 04/18/20 Potential to Achieve Goals: Fair Progress towards PT goals: Progressing toward goals    Frequency    Min 2X/week      PT Plan Current plan remains appropriate       AM-PAC PT "6 Clicks" Mobility   Outcome Measure  Help needed turning from your back to your side while in a flat bed without using bedrails?: A Lot Help needed moving from lying on your back to sitting on the side of a flat bed without using bedrails?: A Lot Help needed moving to and from a bed to a chair (including a wheelchair)?: Total Help needed standing up from a chair using your arms (e.g., wheelchair or bedside chair)?: Total Help needed to walk in hospital room?: Total Help needed climbing 3-5 steps with a railing? : Total 6 Click Score: 8    End of Session Equipment Utilized During Treatment: Gait belt Activity Tolerance: Patient tolerated treatment well;Patient limited by fatigue Patient left: in bed;with call bell/phone within reach;with bed alarm set Nurse Communication: Mobility status (positioning) PT Visit Diagnosis: Muscle weakness (generalized) (M62.81);Difficulty in walking, not elsewhere classified (R26.2);Pain Pain - Right/Left: Left Pain - part of body: Ankle and joints of foot     Time: [id number] PT Time Calculation (min) (ACUTE ONLY): 23 min  Charges:  $Therapeutic Exercise: 23-37 mins                    Herminio Commons, PT, DPT 12:17 PM,04/06/20

## 2020-04-06 NOTE — TOC Progression Note (Signed)
Transition of Care (TOC) - Progression Note    Patient Details  Name: Nathan [name]. MRN: [id number] Date of Birth: [birth year]  Transition of Care [facility]) CM/SW [place], LCSW Phone Number: 04/06/2020, 1:03 PM  Clinical Narrative:   [facility] Representative [place] accepted patient for [place] [facility] and OT services.    Expected Discharge Plan: [place] Barriers to Discharge: Continued Medical Work up  Expected Discharge Plan and Services Expected Discharge Plan: [place] arrangements for the past 2 months: Single Family Home                 DME Arranged: Walker rolling DME Agency: [employer] Date DME Agency Contacted: 04/05/20   Representative spoke with at DME Agency: Nash Shearer [facility] Arranged: PT           Social Determinants of Health ([place]) Interventions    Readmission Risk Interventions No flowsheet data found.

## 2020-04-06 NOTE — Plan of Care (Signed)
Pt alert and oriented, on room air, afebrile. Pt refused orthostatic BP. Per pt "I cant today". Pain controlled  with PRN percocet with fair relief. NPO midnight per order. Falls precautions remained in place, call bell within reach Problem: Education: Goal: Ability to demonstrate management of disease process will improve Outcome: Progressing   Problem: Activity: Goal: Capacity to carry out activities will improve Outcome: Progressing

## 2020-04-07 ENCOUNTER — Observation Stay: Payer: Medicare Other | Admitting: Anesthesiology

## 2020-04-07 ENCOUNTER — Encounter
Admission: EM | Disposition: A | Discharge status: Home / Self Care | Payer: Self-pay | Source: Home / Self Care | Attending: Emergency Medicine

## 2020-04-07 DIAGNOSIS — D649 Anemia, unspecified: Secondary | ICD-10-CM | POA: Diagnosis not present

## 2020-04-07 DIAGNOSIS — D509 Iron deficiency anemia, unspecified: Secondary | ICD-10-CM | POA: Diagnosis not present

## 2020-04-07 HISTORY — PX: COLONOSCOPY WITH PROPOFOL: SHX5780

## 2020-04-07 HISTORY — PX: ESOPHAGOGASTRODUODENOSCOPY: SHX5428

## 2020-04-07 LAB — TYPE AND SCREEN
ABO/RH(D): O NEG
Antibody Screen: NEGATIVE
Unit division: 0
Unit division: 0

## 2020-04-07 LAB — BASIC METABOLIC PANEL
Anion gap: 9 (ref 5–15)
BUN: 16 mg/dL (ref 8–23)
CO2: 25 mmol/L (ref 22–32)
Calcium: 9.2 mg/dL (ref 8.9–10.3)
Chloride: 101 mmol/L (ref 98–111)
Creatinine, Ser: 1.18 mg/dL (ref 0.61–1.24)
GFR, Estimated: >60 mL/min (ref >60)
Glucose, Bld: 92 mg/dL (ref 70–99)
Potassium: 4.4 mmol/L (ref 3.5–5.1)
Sodium: 135 mmol/L (ref 135–145)

## 2020-04-07 LAB — CBC
HCT: 25.4 % — ABNORMAL LOW (ref 39.0–52.0)
Hemoglobin: 8 g/dL — ABNORMAL LOW (ref 13.0–17.0)
MCH: 25.4 pg — ABNORMAL LOW (ref 26.0–34.0)
MCHC: 31.5 g/dL (ref 30.0–36.0)
MCV: 80.6 fL (ref 80.0–100.0)
Platelets: 363 10*3/uL (ref 150–400)
RBC: 3.15 MIL/uL — ABNORMAL LOW (ref 4.22–5.81)
RDW: 16.7 % — ABNORMAL HIGH (ref 11.5–15.5)
WBC: 7.5 10*3/uL (ref 4.0–10.5)
nRBC: 0 % (ref 0.0–0.2)

## 2020-04-07 LAB — BPAM RBC
Blood Product Expiration Date: 202202042359
Blood Product Expiration Date: 202202182359
ISSUE DATE / TIME: 202201190229
Unit Type and Rh: 5100
Unit Type and Rh: 9500

## 2020-04-07 LAB — PHOSPHORUS: Phosphorus: 2.5 mg/dL (ref 2.5–4.6)

## 2020-04-07 LAB — GLUCOSE, CAPILLARY: Glucose-Capillary: 100 mg/dL — ABNORMAL HIGH (ref 70–99)

## 2020-04-07 LAB — MAGNESIUM: Magnesium: 1.7 mg/dL (ref 1.7–2.4)

## 2020-04-07 SURGERY — EGD (ESOPHAGOGASTRODUODENOSCOPY)
Anesthesia: Monitor Anesthesia Care

## 2020-04-07 SURGERY — COLONOSCOPY WITH PROPOFOL
Anesthesia: General

## 2020-04-07 MED ORDER — DICLOFENAC SODIUM 1 % EX GEL
2.0000 g | Freq: Four times a day (QID) | CUTANEOUS | Status: DC | PRN
  Filled 2020-04-07: qty 100

## 2020-04-07 MED ORDER — METOPROLOL SUCCINATE ER 50 MG PO TB24
50.0000 mg | ORAL_TABLET | Freq: Every day | ORAL | Status: DC
  Administered 2020-04-07 – 2020-04-08 (×2): 50 mg via ORAL
  Filled 2020-04-07 (×2): qty 1

## 2020-04-07 MED ORDER — PROPOFOL 500 MG/50ML IV EMUL
INTRAVENOUS | Status: AC
  Filled 2020-04-07: qty 50

## 2020-04-07 MED ORDER — PROPOFOL 500 MG/50ML IV EMUL
INTRAVENOUS | Status: DC | PRN
  Administered 2020-04-07: 150 ug/kg/min via INTRAVENOUS

## 2020-04-07 MED ORDER — PROPOFOL 10 MG/ML IV BOLUS
INTRAVENOUS | Status: AC
  Filled 2020-04-07: qty 20

## 2020-04-07 MED ORDER — LOSARTAN POTASSIUM 25 MG PO TABS
25.0000 mg | ORAL_TABLET | Freq: Every day | ORAL | Status: DC
Start: 2020-04-07 — End: 2020-04-08
  Administered 2020-04-07 – 2020-04-08 (×2): 25 mg via ORAL
  Filled 2020-04-07 (×2): qty 1

## 2020-04-07 MED ORDER — PROPOFOL 10 MG/ML IV BOLUS
INTRAVENOUS | Status: DC | PRN
  Administered 2020-04-07: 50 mg via INTRAVENOUS

## 2020-04-07 MED ORDER — SODIUM CHLORIDE 0.9 % IV SOLN: INTRAVENOUS | Status: DC

## 2020-04-07 MED ORDER — LIDOCAINE HCL (CARDIAC) PF 100 MG/5ML IV SOSY
PREFILLED_SYRINGE | INTRAVENOUS | Status: DC | PRN
  Administered 2020-04-07: 50 mg via INTRAVENOUS

## 2020-04-07 NOTE — Anesthesia Preprocedure Evaluation (Signed)
Anesthesia Evaluation  Patient identified by MRN, date of birth, ID band Patient awake    Reviewed: Allergy & Precautions, H&P , NPO status , Patient's Chart, lab work & pertinent test results  History of Anesthesia Complications Negative for: history of anesthetic complications  Airway Mallampati: III  TM Distance: >3 FB Neck ROM: limited    Dental  (+) Chipped, Poor Dentition, Missing   Pulmonary former smoker,    Pulmonary exam normal        Cardiovascular Exercise Tolerance: Good hypertension, +CHF  (-) Past MI Normal cardiovascular exam+ dysrhythmias Atrial Fibrillation      Neuro/Psych CVA (left sided) negative psych ROS   GI/Hepatic Neg liver ROS, GERD  Medicated and Controlled,  Endo/Other  negative endocrine ROS  Renal/GU Renal disease  negative genitourinary   Musculoskeletal   Abdominal   Peds  Hematology  (+) Blood dyscrasia, anemia ,   Anesthesia Other Findings Patient is NPO appropriate and reports no nausea or vomiting today.   Past Medical History: No date: Atrial flutter ([place])     Comment:  a. s/p TEE/DCCV 04/2017; b. 06/2017 s/p RFCA; c.               CHADS2VASc => 5 (CHF, HTN, age x 1, CVA)-->noncompliant               with Xarelto. No date: Chronic combined systolic and diastolic CHF (congestive  heart failure) ([place])     Comment:  a. TTE 2/19: EF 20-25%, diffuse HK; b. TTE 3/19: EF               20-25%, diffuse HK; c. 08/2017 Echo: EF 35-40%, diff HK.               Gr1 DD; d. 06/2018 Echo: EF 40-45%, DD. Neg bubble study. No date: CKD (chronic kidney disease), stage II-III No date: Essential hypertension No date: GI bleed     Comment:  a.  Hemorrhagic shock in 11/19 secondary to erosive               gastropathy and Barrett's esophagus requiring multiple               units PRBCs; b. Cleared to resume OAC->pt did not. No date: NICM (nonischemic cardiomyopathy) ([place])     Comment:  a.  04/2017: Echo EF 20-25%; b. TTE 3/19: EF 20-25%; c.               07/2017 Cath: min irregs; d. 08/2017 Echo: EF 35-40%, diff               HK; e. 06/2018 Echo: EF 40-45%, DD. No date: Noncompliance with medications No date: PAF (paroxysmal atrial fibrillation) ([place])     Comment:  a. 07/2017 afib->broke w/ IV amio->converted to oral bb               due to prior intol to oral amio. No date: Pulmonary hypertension ([place]) No date: Stroke [facility])     Comment:  a. 06/2018 MRI/A Brain: R paramedian pons late               acute/early subacute infarct, 61m. No assoc hemorrhage               or mass effect.  Sev chronic microvascular isch changtes               and mod voluem loss. No large vessel occlusion, aneurysm,  or significant stenosis.  Past Surgical History: 06/16/2017: A-FLUTTER ABLATION; N/A     Comment:  Procedure: A-FLUTTER ABLATION;  Surgeon: [name], MD;  Location: [place] CV LAB;  Service:               Cardiovascular;  Laterality: N/A; No date: CARDIAC CATHETERIZATION 05/01/2017: CARDIOVERSION; N/A     Comment:  Procedure: CARDIOVERSION;  Surgeon: [name],               MD;  Location: [facility] ORS;  Service: Cardiovascular;                Laterality: N/A; No date: CORONARY ANGIOPLASTY 02/09/2018: ESOPHAGOGASTRODUODENOSCOPY (EGD) WITH PROPOFOL; N/A     Comment:  Procedure: ESOPHAGOGASTRODUODENOSCOPY (EGD) WITH               PROPOFOL;  Surgeon: [name], MD;  Location:               [facility] ENDOSCOPY;  Service: Endoscopy;  Laterality: N/A; 07/18/2017: RIGHT/LEFT HEART CATH AND CORONARY ANGIOGRAPHY; N/A     Comment:  Procedure: RIGHT/LEFT HEART CATH AND CORONARY               ANGIOGRAPHY;  Surgeon: [name], MD;  Location:               [place] CV LAB;  Service: Cardiovascular;                Laterality: N/A; 05/01/2017: TEE WITHOUT CARDIOVERSION; N/A     Comment:  Procedure: TRANSESOPHAGEAL ECHOCARDIOGRAM (TEE);                 Surgeon: [name], MD;  Location: [facility] ORS;                Service: Cardiovascular;  Laterality: N/A; 06/16/2017: TEE WITHOUT CARDIOVERSION; N/A     Comment:  Procedure: TRANSESOPHAGEAL ECHOCARDIOGRAM (TEE);                Surgeon: [name], MD;  Location: [facility] ENDOSCOPY;              Service: Cardiovascular;  Laterality: N/A;  BMI    Body Mass Index: 28.70 kg/m      Reproductive/Obstetrics negative OB ROS                             Anesthesia Physical Anesthesia Plan  ASA: IV  Anesthesia Plan: General   Post-op Pain Management:    Induction: Intravenous  PONV Risk Score and Plan: Propofol infusion and TIVA  Airway Management Planned: Natural Airway and Nasal Cannula  Additional Equipment:   Intra-op Plan:   Post-operative Plan:   Informed Consent: I have reviewed the patients History and Physical, chart, labs and discussed the procedure including the risks, benefits and alternatives for the proposed anesthesia with the patient or authorized representative who has indicated his/her understanding and acceptance.     Dental Advisory Given  Plan Discussed with: Anesthesiologist, CRNA and Surgeon  Anesthesia Plan Comments: (Patient consented for risks of anesthesia including but not limited to:  - adverse reactions to medications - risk of airway placement if required - damage to eyes, teeth, lips or other oral mucosa - nerve damage due to positioning  - sore throat or hoarseness - Damage to heart, brain, nerves, lungs, other  parts of body or loss of life  Patient voiced understanding.)        Anesthesia Quick Evaluation

## 2020-04-07 NOTE — Op Note (Signed)
[facility] Gastroenterology Patient Name: Nathan [name] Procedure Date: 04/07/2020 11:21 AM MRN: [id number] Account #: [id number] Date of Birth: [birth year] Admit Type: Inpatient Age: 70 Room: [facility] ENDO ROOM 3 Gender: Male Note Status: Finalized Procedure:             Upper GI endoscopy Indications:           Iron deficiency anemia Providers:             Andrey Farmer MD, MD Referring MD:          Venetia Maxon. Elijio Miles, MD (Referring MD) Medicines:             Monitored Anesthesia Care Complications:         No immediate complications. Procedure:             Pre-Anesthesia Assessment:                        - Prior to the procedure, a History and Physical was                         performed, and patient medications and allergies were                         reviewed. The patient is competent. The risks and                         benefits of the procedure and the sedation options and                         risks were discussed with the patient. All questions                         were answered and informed consent was obtained.                         Patient identification and proposed procedure were                         verified by the physician, the nurse, the anesthetist                         and the technician in the endoscopy suite. Mental                         Status Examination: alert and oriented. Airway                         Examination: normal oropharyngeal airway and neck                         mobility. Respiratory Examination: clear to                         auscultation. CV Examination: normal. Prophylactic                         Antibiotics: The patient does not require prophylactic  antibiotics. Prior Anticoagulants: The patient has                         taken Eliquis (apixaban), last dose was 3 days prior                         to procedure. ASA Grade Assessment: III - A patient                          with severe systemic disease. After reviewing the                         risks and benefits, the patient was deemed in                         satisfactory condition to undergo the procedure. The                         anesthesia plan was to use monitored anesthesia care                         (MAC). Immediately prior to administration of                         medications, the patient was re-assessed for adequacy                         to receive sedatives. The heart rate, respiratory                         rate, oxygen saturations, blood pressure, adequacy of                         pulmonary ventilation, and response to care were                         monitored throughout the procedure. The physical                         status of the patient was re-assessed after the                         procedure.                        After obtaining informed consent, the endoscope was                         passed under direct vision. Throughout the procedure,                         the patient's blood pressure, pulse, and oxygen                         saturations were monitored continuously. The Endoscope                         was introduced through the mouth, and advanced to the  second part of duodenum. The upper GI endoscopy was                         accomplished without difficulty. The patient tolerated                         the procedure well. Findings:      A small hiatal hernia was present.      The entire examined stomach was normal.      The examined duodenum was normal. Impression:            - Small hiatal hernia.                        - Normal stomach.                        - Normal examined duodenum.                        - No specimens collected. Recommendation:        - Perform a colonoscopy today. Procedure Code(s):     --- Professional ---                        [phone], Esophagogastroduodenoscopy, flexible,                          transoral; diagnostic, including collection of                         specimen(s) by brushing or washing, when performed                         (separate procedure) Diagnosis Code(s):     --- Professional ---                        K44.9, Diaphragmatic hernia without obstruction or                         gangrene                        D50.9, Iron deficiency anemia, unspecified CPT copyright 2019 American Medical Association. All rights reserved. The codes documented in this report are preliminary and upon coder review may  be revised to meet current compliance requirements. Andrey Farmer MD, MD 04/07/2020 12:02:36 PM Number of Addenda: 0 Note Initiated On: 04/07/2020 11:21 AM Estimated Blood Loss:  Estimated blood loss: none.      [facility]

## 2020-04-07 NOTE — Transfer of Care (Signed)
Immediate Anesthesia Transfer of Care Note  Patient: Nathan [name].  Procedure(s) Performed: COLONOSCOPY WITH PROPOFOL (N/A ) ESOPHAGOGASTRODUODENOSCOPY (EGD) (N/A )  Patient Location: Endoscopy Unit  Anesthesia Type:General  Level of Consciousness: awake, alert  and oriented  Airway & Oxygen Therapy: Patient Spontanous Breathing and Patient connected to face mask oxygen  Post-op Assessment: Report given to RN and Post -op Vital signs reviewed and stable  Post vital signs: Reviewed and stable  Last Vitals:  Vitals Value Taken Time  BP    Temp    Pulse    Resp    SpO2      Last Pain:  Vitals:   04/07/20 1058  TempSrc: Skin  PainSc:          Complications: No complications documented.

## 2020-04-07 NOTE — Op Note (Signed)
[facility] Gastroenterology Patient Name: Nathan [name] Procedure Date: 04/07/2020 11:20 AM MRN: [id number] Account #: [id number] Date of Birth: 1950/10/14 Admit Type: Inpatient Age: 70 Room: [facility] ENDO ROOM 3 Gender: Male Note Status: Finalized Procedure:             Colonoscopy Indications:           Iron deficiency anemia Providers:             Andrey Farmer MD, MD Referring MD:          Venetia Maxon. Elijio Miles, MD (Referring MD) Medicines:             Monitored Anesthesia Care Complications:         No immediate complications. Estimated blood loss:                         Minimal. Procedure:             Pre-Anesthesia Assessment:                        - Prior to the procedure, a History and Physical was                         performed, and patient medications and allergies were                         reviewed. The patient is competent. The risks and                         benefits of the procedure and the sedation options and                         risks were discussed with the patient. All questions                         were answered and informed consent was obtained.                         Patient identification and proposed procedure were                         verified by the physician, the nurse, the anesthetist                         and the technician in the endoscopy suite. Mental                         Status Examination: alert and oriented. Airway                         Examination: normal oropharyngeal airway and neck                         mobility. Respiratory Examination: clear to                         auscultation. CV Examination: normal. Prophylactic                         Antibiotics:  The patient does not require prophylactic                         antibiotics. Prior Anticoagulants: The patient has                         taken Eliquis (apixaban), last dose was 3 days prior                         to procedure. ASA Grade  Assessment: III - A patient                         with severe systemic disease. After reviewing the                         risks and benefits, the patient was deemed in                         satisfactory condition to undergo the procedure. The                         anesthesia plan was to use monitored anesthesia care                         (MAC). Immediately prior to administration of                         medications, the patient was re-assessed for adequacy                         to receive sedatives. The heart rate, respiratory                         rate, oxygen saturations, blood pressure, adequacy of                         pulmonary ventilation, and response to care were                         monitored throughout the procedure. The physical                         status of the patient was re-assessed after the                         procedure.                        After obtaining informed consent, the colonoscope was                         passed under direct vision. Throughout the procedure,                         the patient's blood pressure, pulse, and oxygen                         saturations were monitored continuously. The  Colonoscope was introduced through the anus with the                         intention of advancing to the cecum. The scope was                         advanced to the ascending colon before the procedure                         was aborted. Medications were given. The colonoscopy                         was technically difficult and complex due to                         inadequate bowel prep. The colonoscopy was aborted due                         to inadequate bowel prep. Findings:      The perianal and digital rectal examinations were normal.      A large amount of liquid stool was found in the entire colon, precluding       visualization.      A 4 mm polyp was found in the splenic flexure. The polyp was  sessile.       The polyp was removed with a cold snare. Resection and retrieval were       complete. Estimated blood loss was minimal. Impression:            - The procedure was aborted due to inadequate bowel                         prep.                        - Stool in the entire examined colon.                        - One 4 mm polyp at the splenic flexure, removed with                         a cold snare. Resected and retrieved. Recommendation:        - Repeat colonoscopy because the bowel preparation was                         poor.                        - Resume Eliquis (apixaban) at prior dose tomorrow.                        - Await pathology results.                        - Can repeat colonoscopy as an outpatient. Patient                         will need to have a day of clears the day before and  finish all of his bowel prep. He refused to drink the                         rest of the bowel prep this morning. Retroflexion not                         performed due to stool. Procedure Code(s):     --- Professional ---                        [phone], 52, Colonoscopy, flexible; with removal of                         tumor(s), polyp(s), or other lesion(s) by snare                         technique Diagnosis Code(s):     --- Professional ---                        K63.5, Polyp of colon                        D50.9, Iron deficiency anemia, unspecified CPT copyright 2019 American Medical Association. All rights reserved. The codes documented in this report are preliminary and upon coder review may  be revised to meet current compliance requirements. Andrey Farmer MD, MD 04/07/2020 12:08:14 PM Number of Addenda: 0 Note Initiated On: 04/07/2020 11:20 AM Total Procedure Duration: 0 hours 15 minutes 26 seconds  Estimated Blood Loss:  Estimated blood loss was minimal.      [facility]

## 2020-04-07 NOTE — Plan of Care (Signed)
Pt alert and oriented, on room air afebrile, VSS. Pt started on [place]. Multiple BMs this shift using bedpan and some incontinence. Falls precautions remained in place, call bell within reach. Problem: Activity: Goal: Capacity to carry out activities will improve Outcome: Progressing

## 2020-04-07 NOTE — Anesthesia Postprocedure Evaluation (Signed)
Anesthesia Post Note  Patient: Nathan [name].  Procedure(s) Performed: COLONOSCOPY WITH PROPOFOL (N/A ) ESOPHAGOGASTRODUODENOSCOPY (EGD) (N/A )  Patient location during evaluation: Endoscopy Anesthesia Type: General Level of consciousness: awake and alert Pain management: pain level controlled Vital Signs Assessment: post-procedure vital signs reviewed and stable Respiratory status: spontaneous breathing, nonlabored ventilation, respiratory function stable and patient connected to nasal cannula oxygen Cardiovascular status: blood pressure returned to baseline and stable Postop Assessment: no apparent nausea or vomiting Anesthetic complications: no   No complications documented.   Last Vitals:  Vitals:   04/07/20 1217 04/07/20 1227  BP: (!) 150/87 (!) 155/85  Pulse: 73 86  Resp: 18 16  Temp:    SpO2: 97% 100%    Last Pain:  Vitals:   04/07/20 1227  TempSrc:   PainSc: 0-No pain                 Precious Haws Tatem Holsonback

## 2020-04-07 NOTE — Progress Notes (Signed)
[facility] Progress Note  Patient: Nathan [name].    SN:[id number]  DOA: 04/04/2020     Date of Service: the patient was seen and examined on 04/07/2020  Chief Complaint  Patient presents with   Weakness   Brief hospital course: Hawley Luby. is a 70 y.o. male with medical history significant of hypertension, hyperlipidemia, stroke, GERD, atrial fibrillation on Eliquis, sCHF EF of 40-45%, pulmonary hypertension, GI bleeding, CKD-3, tobacco abuse, who presents with weakness for more than 2 days, which is progressively getting worse.  No focal deficit, no neurological symptoms, patient denies any dark stools GI bleeding  ED Course: pt was found to have hemoglobin dropped from 11.7 on 03/24/2018 to 7.4, negative FOBT per ED physician, negative COVID19, CK 41, alcohol level less than 10, stable renal function, WBC 7.8, troponin level 12, potassium 3.4, temperature normal, blood pressure 156/94, heart rate 111, RR 18, oxygen sat 95% on room air.  Chest x-ray negative.  CT head is negative for acute intracranial abnormalities.  Patient is admitted to [place] bed for observation.   Assessment and Plan: Assessment/Plan Principal Problem:   Symptomatic anemia Active Problems:   Chronic systolic heart failure (HCC)   HTN (hypertension)   Tobacco use   Chronic a-fib (HCC)   CVA (cerebral vascular accident) ([place])   Hypokalemia   HLD (hyperlipidemia)   GERD (gastroesophageal reflux disease)   CKD (chronic kidney disease), stage IIIa   Tobacco abuse    Symptomatic anemia: Patient's generalized weakness is likely due to symptomatic anemia.  Patient does not have focal neurodeficit on physical examination.  No signs of infection.  urinalysis Negative.  CT head is negative.  Hemoglobin dropped from 11.7 to 7.4.  Patient is Eliquis for atrial fibrillation.  He denies recent dark stool or rectal bleeding.  Per ED physician, FOBT negative.  There is a possibility that patient may have  episodic GI blood loss. -s/p Transfuse 1 unit of blood which is ordered by ED physician -anemia panel, iron saturation 8%, B12 194 deficiency and folate 7.3 within normal range Started Venofer 200 mg IV daily for 3 days, start oral supplement with vitamin C on discharge Vitamin B12 1000 mcg IM injection daily for 7 days during hospital stay, start oral supplement on discharge -Repeat H&H and CBC in AM - hold off Eliquis now -PT.OT -GI consulted,s/p EGD and colonoscopy, 1 polyp removed, poor colon prep.  EGD normal.  GI recommended resume Eliquis tomorrow and repeat colonoscopy as an outpatient   Chronic systolic heart failure ([place]): 2D echo on 07/01/2018 showed EF of 40- 45%.  Patient has trace leg edema, no respiratory distress, no pulmonary edema on chest x-ray.  CHF seems to be compensated. -Held spironolactone, Lasix due to Low BP - BNP -->138  HTN (hypertension) -IV hydralazine as needed. -Held Cozaar, metoprolol due to Low BP 1/21 BP improved, resumed metoprolol, decreased losartan from 100 to 25 mg p.o. daily  Chronic a-fib  - Metoprolol  -Hold Eliquis temporarily due to concerning for GI blood loss Plan is to resume Eliquis tomorrow a.m. as per GI  CVA (cerebral vascular accident) ([place]) -Lipitor  Hypokalemia: Potassium 3.4 -Replete potassium -magnesium level wnl  HLD (hyperlipidemia) -Lipitor  GERD (gastroesophageal reflux disease) -Protonix  AKI on CKD (chronic kidney disease), stage IIIa:  Most likely due to low blood pressure and anemia Creatinine level improving -Follow-up with BMP  Tobacco abuse, Nicotine patch.  Smoking cessation counseling done UDS positive for cocaine, drug abuse abstinence counseling  done.  Vitamin D deficiency, started vitamin D 50k units p.o. weekly for total 8 weeks.  Follow with PCP and repeat vitamin D level after 3 to 6 months.  PT and OT eval done, recommended SNF placement   Body mass index is 24.67 kg/m.        Diet: Heart healthy/carb modified DVT Prophylaxis: SCD, pharmacological prophylaxis contraindicated due to possible GI bleeding   Advance goals of care discussion: Full code  Family Communication: family was not present at bedside, at the time of interview.  The pt provided permission to discuss medical plan with the family. Opportunity was given to ask question and all questions were answered satisfactorily.   Disposition:  Pt is from Home, admitted with symptomatic anemia, still has low blood pressure, which precludes a safe discharge. Discharge to home, most likely tomorrow, EGD and colonoscopy done today patient was more sleepy.  BP improved today resumed home medications.  We will check blood pressure tomorrow, monitor H&H and resume Eliquis tomorrow a.m. PT/OT eval done recommended SNF placement but patient is refusing so he will be discharged tomorrow with home PT    Subjective: No significant overnight events, patient denies any dizziness, no shortness of breath.  No active GI bleeding Patient did not finish GoLytely, poor prep Patient was sleepy after EGD and colonoscopy.  We will plan to discharge him tomorrow a.m.  Physical Exam: General:  alert oriented to time, place, and person.  Appear in no distress, affect appropriate Eyes: PERRLA ENT: Oral Mucosa Clear, moist  Neck: no JVD,  Cardiovascular: S1 and S2 Present, no Murmur,  Respiratory: good respiratory effort, Bilateral Air entry equal and Decreased, no Crackles, no wheezes Abdomen: Bowel Sound present, Soft and no tenderness,  Skin: no rashes Extremities: no Pedal edema, no calf tenderness Neurologic: without any new focal findings Gait not checked due to patient safety concerns  Vitals:   04/07/20 1207 04/07/20 1217 04/07/20 1227 04/07/20 1252  BP: 112/75 (!) 150/87 (!) 155/85 (!) 149/93  Pulse: 85 73 86 80  Resp: (!) '22 18 16 15  '$ Temp: 98 F (36.7 C)   98.6 F (37 C)  TempSrc: Skin     SpO2: 100% 97%  100% 100%  Weight:      Height:        Intake/Output Summary (Last 24 hours) at 04/07/2020 1559 Last data filed at 04/07/2020 1310 Gross per 24 hour  Intake 220 ml  Output 1600 ml  Net -1380 ml   Filed Weights   04/05/20 0518 04/06/20 0617 04/07/20 1058  Weight: 94.3 kg 97.1 kg 109.8 kg    Data Reviewed: I have personally reviewed and interpreted daily labs, tele strips, imagings as discussed above. I reviewed all nursing notes, pharmacy notes, vitals, pertinent old records I have discussed plan of care as described above with RN and patient/family.  CBC: Recent Labs  Lab 04/04/20 1213 04/04/20 1457 04/05/20 0621 04/06/20 0431 04/06/20 1720 04/07/20 0458  WBC 7.8  --  6.1 7.6  --  7.5  HGB 7.4* 8.2* 7.9* 7.5* 8.5* 8.0*  HCT 23.4* 25.6* 24.5* 23.0* 26.7* 25.4*  MCV 79.1*  --  79.5* 80.4  --  80.6  PLT 381  --  328 326  --  [id number]   Basic Metabolic Panel: Recent Labs  Lab 04/04/20 1213 04/04/20 1457 04/05/20 0621 04/06/20 0431 04/07/20 0458  NA 138  --  138 138 135  K 3.4*  --  4.0 3.8 4.4  CL 100  --  104 104 101  CO2 23  --  '25 25 25  '$ GLUCOSE 106*  --  107* 103* 92  BUN 32*  --  29* 26* 16  CREATININE 1.43*  --  1.38* 1.30* 1.18  CALCIUM 9.2  --  8.8* 8.7* 9.2  MG  --  1.9  --  1.8 1.7  PHOS  --   --   --  2.6 2.5    Studies: No results found.  Scheduled Meds:  atorvastatin  80 mg Oral q1800   cyanocobalamin  1,000 mcg Intramuscular Daily   multivitamin with minerals  1 tablet Oral Daily   nicotine  21 mg Transdermal Daily   pantoprazole  40 mg Oral Daily   polyethylene glycol  17 g Oral Daily   tiZANidine  2 mg Oral TID   Vitamin D (Ergocalciferol)  50,000 Units Oral Q7 days   Continuous Infusions:  iron sucrose Stopped (04/06/20 0846)   PRN Meds: acetaminophen, bisacodyl, bisacodyl, hydrALAZINE, ondansetron (ZOFRAN) IV, oxyCODONE-acetaminophen  Time spent: 35 minutes  Author: Val Riles. MD [facility] 04/07/2020 3:59  PM  To reach On-call, see care teams to locate the attending and reach out to them via [email or web address]. If 7PM-7AM, please contact night-coverage If you still have difficulty reaching the attending provider, please page the [facility] (Director on Call) for [facility] on amion for assistance.

## 2020-04-08 DIAGNOSIS — D509 Iron deficiency anemia, unspecified: Secondary | ICD-10-CM | POA: Diagnosis not present

## 2020-04-08 DIAGNOSIS — D649 Anemia, unspecified: Secondary | ICD-10-CM | POA: Diagnosis not present

## 2020-04-08 LAB — BASIC METABOLIC PANEL
Anion gap: 8 (ref 5–15)
BUN: 13 mg/dL (ref 8–23)
CO2: 26 mmol/L (ref 22–32)
Calcium: 8.9 mg/dL (ref 8.9–10.3)
Chloride: 104 mmol/L (ref 98–111)
Creatinine, Ser: 1.17 mg/dL (ref 0.61–1.24)
GFR, Estimated: >60 mL/min (ref >60)
Glucose, Bld: 100 mg/dL — ABNORMAL HIGH (ref 70–99)
Potassium: 4.2 mmol/L (ref 3.5–5.1)
Sodium: 138 mmol/L (ref 135–145)

## 2020-04-08 LAB — CBC
HCT: 23.2 % — ABNORMAL LOW (ref 39.0–52.0)
Hemoglobin: 7.5 g/dL — ABNORMAL LOW (ref 13.0–17.0)
MCH: 25.6 pg — ABNORMAL LOW (ref 26.0–34.0)
MCHC: 32.3 g/dL (ref 30.0–36.0)
MCV: 79.2 fL — ABNORMAL LOW (ref 80.0–100.0)
Platelets: 385 10*3/uL (ref 150–400)
RBC: 2.93 MIL/uL — ABNORMAL LOW (ref 4.22–5.81)
RDW: 16.8 % — ABNORMAL HIGH (ref 11.5–15.5)
WBC: 7.4 10*3/uL (ref 4.0–10.5)
nRBC: 0 % (ref 0.0–0.2)

## 2020-04-08 LAB — PHOSPHORUS: Phosphorus: 2.9 mg/dL (ref 2.5–4.6)

## 2020-04-08 LAB — MAGNESIUM: Magnesium: 1.7 mg/dL (ref 1.7–2.4)

## 2020-04-08 MED ORDER — FUROSEMIDE 40 MG PO TABS: 40.0000 mg | ORAL_TABLET | Freq: Every day | ORAL | 0 refills | Status: DC

## 2020-04-08 MED ORDER — MAGNESIUM OXIDE 400 (241.3 MG) MG PO TABS: 400.0000 mg | ORAL_TABLET | Freq: Two times a day (BID) | ORAL | 0 refills | Status: DC

## 2020-04-08 MED ORDER — MAGNESIUM OXIDE 400 (241.3 MG) MG PO TABS
400.0000 mg | ORAL_TABLET | Freq: Two times a day (BID) | ORAL | Status: DC
Start: 2020-04-08 — End: 2020-04-08
  Administered 2020-04-08: 400 mg via ORAL

## 2020-04-08 MED ORDER — ACETAMINOPHEN 325 MG PO TABS: 650.0000 mg | ORAL_TABLET | Freq: Four times a day (QID) | ORAL | Status: DC | PRN

## 2020-04-08 MED ORDER — ASCORBIC ACID 500 MG PO TABS
250.0000 mg | ORAL_TABLET | Freq: Every day | ORAL | 0 refills | Status: DC
Start: 2020-04-08 — End: 2021-04-27

## 2020-04-08 MED ORDER — VITAMIN D (ERGOCALCIFEROL) 1.25 MG (50000 UNIT) PO CAPS
50000.0000 [IU] | ORAL_CAPSULE | ORAL | 0 refills | Status: DC
Start: 2020-04-12 — End: 2020-04-08

## 2020-04-08 MED ORDER — CYANOCOBALAMIN 500 MCG PO TABS: 500.0000 ug | ORAL_TABLET | Freq: Every day | ORAL | 0 refills | Status: DC

## 2020-04-08 MED ORDER — DICLOFENAC SODIUM 1 % EX GEL: 2.0000 g | Freq: Four times a day (QID) | CUTANEOUS | 0 refills | Status: DC | PRN

## 2020-04-08 MED ORDER — LOSARTAN POTASSIUM 25 MG PO TABS: 25.0000 mg | ORAL_TABLET | Freq: Every day | ORAL | 0 refills | Status: DC

## 2020-04-08 MED ORDER — POLYSACCHARIDE IRON COMPLEX 150 MG PO CAPS: 150.0000 mg | ORAL_CAPSULE | Freq: Every day | ORAL | 0 refills | Status: DC

## 2020-04-08 MED ORDER — VITAMIN D (ERGOCALCIFEROL) 1.25 MG (50000 UNIT) PO CAPS: 50000.0000 [IU] | ORAL_CAPSULE | ORAL | 0 refills | Status: AC

## 2020-04-08 NOTE — Plan of Care (Signed)
  Problem: Education: Goal: Ability to demonstrate management of disease process will improve Outcome: Adequate for Discharge Goal: Ability to verbalize understanding of medication therapies will improve Outcome: Adequate for Discharge Goal: Individualized Educational Video(s) Outcome: Adequate for Discharge   Problem: Activity: Goal: Capacity to carry out activities will improve Outcome: Adequate for Discharge   Problem: Cardiac: Goal: Ability to achieve and maintain adequate cardiopulmonary perfusion will improve Outcome: Adequate for Discharge   Problem: Acute Rehab PT Goals(only PT should resolve) Goal: Pt Will Go Sit To Supine/Side Outcome: Adequate for Discharge Goal: Pt Will Transfer Bed To Chair/Chair To Bed Outcome: Adequate for Discharge Goal: Pt Will Ambulate Outcome: Adequate for Discharge   Problem: Acute Rehab PT Goals(only PT should resolve) Goal: Pt Will Go Sit To Supine/Side Outcome: Adequate for Discharge Goal: Pt Will Transfer Bed To Chair/Chair To Bed Outcome: Adequate for Discharge Goal: Pt Will Ambulate Outcome: Adequate for Discharge   Problem: Acute Rehab OT Goals (only OT should resolve) Goal: Pt. Will Transfer To Toilet Outcome: Adequate for Discharge Goal: Pt. Will Perform Toileting-Clothing Manipulation Outcome: Adequate for Discharge   Problem: Acute Rehab OT Goals (only OT should resolve) Goal: Pt. Will Perform Lower Body Bathing Description: Perform sit>stand LB bathing with MIN A Outcome: Adequate for Discharge

## 2020-04-08 NOTE — Discharge Summary (Signed)
[facility] Discharge Summary   Patient: Nathan [name]. [id number]  PCP: [name], MD  Date of admission: 04/04/2020   Date of discharge:  04/08/2020     Discharge Diagnoses:  Principal Problem:   Symptomatic anemia Active Problems:   Chronic systolic heart failure (HCC)   HTN (hypertension)   Tobacco use   Chronic a-fib (HCC)   CVA (cerebral vascular accident) ([place])   Hypokalemia   HLD (hyperlipidemia)   GERD (gastroesophageal reflux disease)   CKD (chronic kidney disease), stage IIIa   Tobacco abuse   Admitted From: Home Disposition:  Home  With HHPT  Recommendations for Outpatient Follow-up:  1. PCP: In 1 week 2. Follow up LABS/TEST: CBC next week, if H&H is stable then resume Eliquis   Diet recommendation: Cardiac diet  Activity: The patient is advised to gradually reintroduce usual activities, as tolerated  Discharge Condition: stable  Code Status: Full code   History of present illness: As per the H and P dictated on admission Hospital Course:  Nathan Richardsonis a 70 y.o.malewith medical history significant ofhypertension, hyperlipidemia, stroke, GERD, atrial fibrillation on Eliquis,sCHFEF of 40-45%, pulmonary hypertension, GI bleeding, CKD-3, tobacco abuse, who presents with weakness for more than 2 days, which is progressively getting worse.  No focal deficit, no neurological symptoms, patient denies any dark stools GI bleeding  ED Course:pt was found to have hemoglobin dropped from 11.7 on 1/7/2020to7.4, negative FOBT per ED physician, negative COVID19, CK 41, alcohol level less than 10, stable renal function, WBC 7.8, troponin level 12, potassium 3.4, temperature normal, blood pressure 156/94, heart rate 111, RR 18, oxygen sat 95% on room air. Chest x-ray negative. CT head is negative for acute intracranial abnormalities. Patient is admitted to [facility] bed for observation.  Assessment and Plan: Principal Problem: Symptomatic  anemia Active Problems: Chronic systolic heart failure (HCC) HTN (hypertension) Tobacco use Chronic a-fib (HCC) CVA (cerebral vascular accident) ([place]) Hypokalemia HLD (hyperlipidemia) GERD (gastroesophageal reflux disease) CKD (chronic kidney disease), stage IIIa Tobacco abuse  Symptomatic anemia:Patient's generalized weakness is likely due to symptomatic anemia. Patient does not have focal neurodeficit on physical examination. No signs of infection. urinalysis Negative. CT head is negative. Hemoglobin dropped from 11.7 to7.4. Patient is Eliquis for atrial fibrillation. He denies recent dark stool or rectal bleeding. Per ED physician, FOBT negative. There is a possibility that patient may have episodic GI blood loss. s/p Transfuse 1 unit of blood which is ordered by ED physician. anemia panel, iron saturation 8%, B12 194 deficiency and folate 7.3 within normal range. Started Venofer 200 mg IV daily for 3 days, started oral supplement with vitamin C on discharge. Vitamin B12 1000 mcg IM injection daily during hospital stay, start oral supplement on discharge. hold offEliquis now, repeat CBC next week if hemoglobin remained stable then resume Eliquis and follow with PCP. -GI consulted,s/p EGD and colonoscopy, 1 polyp removed, poor colon prep.  EGD normal.  GI recommended resume Eliquis tomorrow and repeat colonoscopy as an outpatient Chronic systolic heart failure ([id number] echo on 07/01/2018 showed EF of 40-45%. Patient has trace leg edema, no respiratory distress, no pulmonary edema on chest x-ray. CHF seems to be compensated. -Held spironolactone, Lasix due to Low BP - BNP -->138 HTN (hypertension), Held Cozaar, metoprolol due to Low BP 1/21 BP improved, resumed metoprolol, decreased losartan from 100 to 25 mg p.o. daily. Decreased Lasix from 80 to 40 mg p.o. daily on discharge. Patient was advised to monitor BP and with PCP to  titrate medication  accordingly. Chronic a-fib, continue metoprolol, -Hold Eliquis temporarily due to concerning for GI blood loss. Repeat CBC next week and then resume Eliquis if H&H remained stable and no GI bleeding. CVA (cerebral vascular accident) continue Lipitor Hypokalemia: Potassium 3.4, Repleted potassium, magnesium level 1.7, and oral supplement HLD (hyperlipidemia), Lipitor GERD (gastroesophageal reflux disease), Protonix AKI on CKD (chronic kidney disease), stage IIIa: Most likely due to low blood pressure and anemia. Creatinine level improved, AKI resolved  Tobacco abuse, Nicotine patch.  Smoking cessation counseling done UDS positive for cocaine, drug abuse abstinence counseling done. Vitamin D deficiency, started vitamin D 50k units p.o. weekly for total 8 weeks.  Follow with PCP and repeat vitamin D level after 3 to 6 months. PT and OT eval done, recommended SNF placement but patient refused so patient is being discharged home with home PT. Body mass index is 26.14 kg/m.   Patient was ambulatory with assistance. Patient was seen by physical therapy, who recommended SNF placement but patient refused so home health was arranged. On the day of the discharge the patient's vitals were stable, and no other acute medical condition were reported by patient. the patient was felt safe to be discharge at Home with Home health.  Consultants: GI Procedures: EGD and colonoscopy  Discharge Exam: General: Appear in no distress, no Rash; Oral Mucosa Clear, moist. Cardiovascular: S1 and S2 Present, no Murmur, Respiratory: normal respiratory effort, Bilateral Air entry present and no Crackles, no wheezes Abdomen: Bowel Sound present, Soft and no tenderness, no hernia Extremities: no Pedal edema, no calf tenderness Neurology: alert and oriented to time, place, and person affect appropriate.  Filed Weights   04/06/20 0617 04/07/20 1058 04/08/20 0500  Weight: 97.1 kg 109.8 kg 100 kg   Vitals:   04/08/20  0409 04/08/20 0750  BP: (!) 103/51 132/74  Pulse: 70 76  Resp: 17 16  Temp: 98.8 F (37.1 C) 99.1 F (37.3 C)  SpO2: 96% 99%    DISCHARGE MEDICATION: Allergies as of 04/08/2020   No Known Allergies     Medication List    TAKE these medications   acetaminophen 325 MG tablet Commonly known as: TYLENOL Take 2 tablets (650 mg total) by mouth every 6 (six) hours as needed for fever, headache or moderate pain.   ascorbic acid 500 MG tablet Commonly known as: VITAMIN C Take 0.5 tablets (250 mg total) by mouth daily.   atorvastatin 80 MG tablet Commonly known as: LIPITOR TAKE 1 TABLET BY MOUTH ONCE DAILY What changed:   how much to take  how to take this  when to take this   diclofenac Sodium 1 % Gel Commonly known as: VOLTAREN Apply 2 g topically every 6 (six) hours as needed (pain).   Eliquis 5 MG Tabs tablet Generic drug: apixaban Take 1 tablet (5 mg total) by mouth 2 (two) times daily. Start taking on: April 12, 2020 What changed:   how much to take  These instructions start on April 12, 2020. If you are unsure what to do until then, ask your doctor or other care provider.   furosemide 40 MG tablet Commonly known as: LASIX Take 1 tablet (40 mg total) by mouth daily. What changed:   medication strength  how much to take   iron polysaccharides 150 MG capsule Commonly known as: NIFEREX Take 1 capsule (150 mg total) by mouth daily.   losartan 25 MG tablet Commonly known as: COZAAR Take 1 tablet (25 mg total) by mouth  daily. What changed:   medication strength  how much to take   magnesium oxide 400 (241.3 Mg) MG tablet Commonly known as: MAG-OX Take 1 tablet (400 mg total) by mouth 2 (two) times daily.   metoprolol succinate 50 MG 24 hr tablet Commonly known as: TOPROL-XL TAKE 1 TABLET BY MOUTH ONCE DAILY. TAKE WITH OR IMMEDIATELY FOLLOWING A MEAL. What changed:   how much to take  how to take this  when to take this   pantoprazole 40  MG tablet Commonly known as: PROTONIX TAKE 1 TABLET BY MOUTH IN THE MORNING AT  6  AM What changed:   how much to take  how to take this  when to take this   spironolactone 25 MG tablet Commonly known as: ALDACTONE Take 1 tablet (25 mg total) by mouth daily.   tiZANidine 2 MG tablet Commonly known as: ZANAFLEX Take 2 mg by mouth 3 (three) times daily.   vitamin B-12 500 MCG tablet Commonly known as: CYANOCOBALAMIN Take 1 tablet (500 mcg total) by mouth daily.   Vitamin D (Ergocalciferol) 1.25 MG (50000 UNIT) Caps capsule Commonly known as: DRISDOL Take 1 capsule (50,000 Units total) by mouth every 7 (seven) days for 7 doses. Start taking on: April 12, 2020      No Known Allergies Discharge Instructions    Call MD for:   Complete by: As directed    GI bleeding or dark stools.  Immediately return back to ED and stop taking Eliquis   Diet - low sodium heart healthy   Complete by: As directed    Discharge instructions   Complete by: As directed    Follow with PCP in 1 week, repeat CBC next week Monday or Tuesday, if hemoglobin stable and no GI bleeding then restart Eliquis.  Monitor blood pressure at home and follow with PCP to titrate medications accordingly, blood pressure was low so decreased dose of losartan from 100 to 25 mg and decrease Lasix from 80 to 40 mg. Follow-up with GI as an outpatient for repeat colonoscopy with good GI prep   Increase activity slowly   Complete by: As directed       The results of significant diagnostics from this hospitalization (including imaging, microbiology, ancillary and laboratory) are listed below for reference.    Significant Diagnostic Studies: CT Head Wo Contrast  Result Date: 04/04/2020 CLINICAL DATA:  Stroke suspected, history of stroke, patient found down soiled with urine and feces. EXAM: CT HEAD WITHOUT CONTRAST TECHNIQUE: Contiguous axial images were obtained from the base of the skull through the vertex without  intravenous contrast. COMPARISON:  MRI head June 30, 2018 and CT head June 30, 2018. FINDINGS: Brain: Stable moderate diffuse parenchymal atrophy. There is no intracranial mass, hemorrhage, extra-axial fluid collection or midline shift. Similar severe burden of ischemic white matter disease. Chronic lacunar type infarct in the pons. Vascular: No hyperdense vessel or unexpected calcification. Atherosclerotic calcifications of the carotid and vertebral arteries. Skull: Normal. Negative for fracture or focal lesion. Sinuses/Orbits: No acute finding. Prior left cataract removal. Likely dense cerumen in the bilateral external auditory canals. Other: None IMPRESSION: 1. No acute intracranial abnormality. 2. Stable moderate diffuse parenchymal atrophy and severe ischemic white matter disease. Electronically Signed   By: Dahlia Bailiff MD   On: 04/04/2020 12:57   DG Chest Portable 1 View  Result Date: 04/04/2020 CLINICAL DATA:  weakness EXAM: PORTABLE CHEST 1 VIEW COMPARISON:  07/06/2018 and prior. FINDINGS: No focal consolidation. Mild hypoinflation.  No pneumothorax or pleural effusion. Cardiomediastinal silhouette is within normal limits. No acute osseous abnormality. IMPRESSION: No focal airspace disease. Electronically Signed   By: Primitivo Gauze M.D.   On: 04/04/2020 13:05    Microbiology: Recent Results (from the past 240 hour(s))  Resp Panel by RT-PCR (Flu A&B, Covid) Nasopharyngeal Swab     Status: None   Collection Time: 04/04/20 12:13 PM   Specimen: Nasopharyngeal Swab; Nasopharyngeal(NP) swabs in vial transport medium  Result Value Ref Range Status   SARS Coronavirus 2 by RT PCR NEGATIVE NEGATIVE Final    Comment: (NOTE) SARS-CoV-2 target nucleic acids are NOT DETECTED.  The SARS-CoV-2 RNA is generally detectable in upper respiratory specimens during the acute phase of infection. The lowest concentration of SARS-CoV-2 viral copies this assay can detect is 138 copies/mL. A negative  result does not preclude SARS-Cov-2 infection and should not be used as the sole basis for treatment or other patient management decisions. A negative result may occur with  improper specimen collection/handling, submission of specimen other than nasopharyngeal swab, presence of viral mutation(s) within the areas targeted by this assay, and inadequate number of viral copies(<138 copies/mL). A negative result must be combined with clinical observations, patient history, and epidemiological information. The expected result is Negative.  Fact Sheet for Patients:  EntrepreneurPulse.com.au  Fact Sheet for Healthcare Providers:  IncredibleEmployment.be  This test is no t yet approved or cleared by the [place] FDA and  has been authorized for detection and/or diagnosis of SARS-CoV-2 by FDA under an Emergency Use Authorization (EUA). This EUA will remain  in effect (meaning this test can be used) for the duration of the COVID-19 declaration under Section 564(b)(1) of the Act, 21 U.S.C.section 360bbb-3(b)(1), unless the authorization is terminated  or revoked sooner.       Influenza A by PCR NEGATIVE NEGATIVE Final   Influenza B by PCR NEGATIVE NEGATIVE Final    Comment: (NOTE) The Xpert Xpress SARS-CoV-2/FLU/RSV plus assay is intended as an aid in the diagnosis of influenza from Nasopharyngeal swab specimens and should not be used as a sole basis for treatment. Nasal washings and aspirates are unacceptable for Xpert Xpress SARS-CoV-2/FLU/RSV testing.  Fact Sheet for Patients: EntrepreneurPulse.com.au  Fact Sheet for Healthcare Providers: IncredibleEmployment.be  This test is not yet approved or cleared by the [place] FDA and has been authorized for detection and/or diagnosis of SARS-CoV-2 by FDA under an Emergency Use Authorization (EUA). This EUA will remain in effect (meaning this test can be used)  for the duration of the COVID-19 declaration under Section 564(b)(1) of the Act, 21 U.S.C. section 360bbb-3(b)(1), unless the authorization is terminated or revoked.  Performed at [facility], [address] [place], [place] [address]      Labs: CBC: Recent Labs  Lab 04/04/20 1213 04/04/20 1457 04/05/20 0621 04/06/20 0431 04/06/20 1720 04/07/20 0458 04/08/20 0503  WBC 7.8  --  6.1 7.6  --  7.5 7.4  HGB 7.4*   < > 7.9* 7.5* 8.5* 8.0* 7.5*  HCT 23.4*   < > 24.5* 23.0* 26.7* 25.4* 23.2*  MCV 79.1*  --  79.5* 80.4  --  80.6 79.2*  PLT 381  --  328 326  --  363 385   < > = values in this interval not displayed.   Basic Metabolic Panel: Recent Labs  Lab 04/04/20 1213 04/04/20 1457 04/05/20 0621 04/06/20 0431 04/07/20 0458 04/08/20 0503  NA 138  --  138 138 135 138  K 3.4*  --  4.0 3.8 4.4 4.2  CL 100  --  104 104 101 104  CO2 23  --  '25 25 25 26  '$ GLUCOSE 106*  --  107* 103* 92 100*  BUN 32*  --  29* 26* 16 13  CREATININE 1.43*  --  1.38* 1.30* 1.18 1.17  CALCIUM 9.2  --  8.8* 8.7* 9.2 8.9  MG  --  1.9  --  1.8 1.7 1.7  PHOS  --   --   --  2.6 2.5 2.9   Liver Function Tests: Recent Labs  Lab 04/04/20 1213  AST 30  ALT 16  ALKPHOS 68  BILITOT 0.7  PROT 8.4*  ALBUMIN 2.9*   No results for input(s): LIPASE, AMYLASE in the last 168 hours. No results for input(s): AMMONIA in the last 168 hours. Cardiac Enzymes: Recent Labs  Lab 04/04/20 1213  CKTOTAL 41*   BNP (last 3 results) Recent Labs    04/04/20 1213  BNP 138.8*   CBG: Recent Labs  Lab 04/06/20 0810 04/07/20 0722  GLUCAP 76 100*    Time spent: 35 minutes  Signed:  Val Riles  [facility]  04/08/2020 3:55 PM

## 2020-04-10 ENCOUNTER — Encounter: Payer: Self-pay | Admitting: Gastroenterology

## 2020-04-10 LAB — SURGICAL PATHOLOGY

## 2020-04-23 NOTE — Progress Notes (Deleted)
Patient ID: Nathan [name]., male    DOB: August 21, 1950, 70 y.o.   MRN: [id number]  HPI  Mr Nathan [name] is a 70 y/o male with a history of HTN, stroke, previous tobacco use and chronic heart failure.   Echo report from 07/01/2018 reviewed and showed an EF of 40-45%. Echo report from 09/04/17 reviewed and showed an EF of 35-40%. Echo report from 06/16/17 reviewed and showed an EF of 15-20% along with mod-severe TR and an elevated PA pressure of 45 mm Hg.  Cardiac catheterization done 07/18/17 and showed heavily calcified coronary arteries with mild nonobstructive disease.  Left dominant system. Right heart catheterization showed moderately to severely elevated filling pressures, severe pulmonary hypertension and severely reduced cardiac output.   Admitted 04/04/20 due to worsening weakness found to be anemic with hemoglobin of 7.4. GI consult obtained. Head CT negative. Given 1 unit of PRBC's along with venofer IV. EGD and colonoscopy done with removal of a polyp. Diuretic held due to hypotension. PT consult obtained. Discharged after 4 days.    He presents today with a chief complaint of a follow-up visit.   Past Medical History:  Diagnosis Date  . Atrial flutter (Nathan [name])    a. s/p TEE/DCCV 04/2017; b. 06/2017 s/p RFCA; c. CHADS2VASc => 5 (CHF, HTN, age x 1, CVA)-->noncompliant with Xarelto.  . Chronic combined systolic and diastolic CHF (congestive heart failure) (Nathan [name])    a. TTE 2/19: EF 20-25%, diffuse HK; b. TTE 3/19: EF 20-25%, diffuse HK; c. 08/2017 Echo: EF 35-40%, diff HK. Gr1 DD; d. 06/2018 Echo: EF 40-45%, DD. Neg bubble study.  . CKD (chronic kidney disease), stage II-III   . Essential hypertension   . GI bleed    a.  Hemorrhagic shock in 11/19 secondary to erosive gastropathy and Barrett's esophagus requiring multiple units PRBCs; b. Cleared to resume OAC->pt did not.  Marland Kitchen NICM (nonischemic cardiomyopathy) (Nathan [name])    a. 04/2017: Echo EF 20-25%; b. TTE 3/19: EF 20-25%; c. 07/2017 Cath: min irregs; d. 08/2017  Echo: EF 35-40%, diff HK; e. 06/2018 Echo: EF 40-45%, DD.  Marland Kitchen Noncompliance with medications   . PAF (paroxysmal atrial fibrillation) (Nathan [name])    a. 07/2017 afib->broke w/ IV amio->converted to oral bb due to prior intol to oral amio.  . Pulmonary hypertension (Nathan [name])   . Stroke Nathan [name])    a. 06/2018 MRI/A Brain: R paramedian pons late acute/early subacute infarct, 62m. No assoc hemorrhage or mass effect.  Sev chronic microvascular isch changtes and mod voluem loss. No large vessel occlusion, aneurysm, or significant stenosis.   Past Surgical History:  Procedure Laterality Date  . A-FLUTTER ABLATION N/A 06/16/2017   Procedure: A-FLUTTER ABLATION;  Surgeon: TEvans Lance MD;  [facility];  Service: Cardiovascular;  Laterality: N/A;  . CARDIAC CATHETERIZATION    . CARDIOVERSION N/A 05/01/2017   Procedure: CARDIOVERSION;  Surgeon: GMinna Merritts MD;  Location: [facility];  Service: Cardiovascular;  Laterality: N/A;  . COLONOSCOPY WITH PROPOFOL N/A 04/07/2020   Procedure: COLONOSCOPY WITH PROPOFOL;  Surgeon: LLesly Rubenstein MD;  Location: [facility] ENDOSCOPY;  Service: Endoscopy;  Laterality: N/A;  . CORONARY ANGIOPLASTY    . ESOPHAGOGASTRODUODENOSCOPY N/A 04/07/2020   Procedure: ESOPHAGOGASTRODUODENOSCOPY (EGD);  Surgeon: LLesly Rubenstein MD;  [facility];  Service: Endoscopy;  Laterality: N/A;  . ESOPHAGOGASTRODUODENOSCOPY (EGD) WITH PROPOFOL N/A 02/09/2018   Procedure: ESOPHAGOGASTRODUODENOSCOPY (EGD) WITH PROPOFOL;  Surgeon: TVirgel Manifold MD;  Location: [facility] ENDOSCOPY;  Service: Endoscopy;  Laterality: N/A;  .  RIGHT/LEFT HEART CATH AND CORONARY ANGIOGRAPHY N/A 07/18/2017   Procedure: RIGHT/LEFT HEART CATH AND CORONARY ANGIOGRAPHY;  Surgeon: [name], MD;  Location: [place] [facility];  Service: Cardiovascular;  Laterality: N/A;  . TEE WITHOUT CARDIOVERSION N/A 05/01/2017   Procedure: TRANSESOPHAGEAL ECHOCARDIOGRAM (TEE);  Surgeon: [name],  MD;  Location: [facility];  Service: Cardiovascular;  Laterality: N/A;  . TEE WITHOUT CARDIOVERSION N/A 06/16/2017   Procedure: TRANSESOPHAGEAL ECHOCARDIOGRAM (TEE);  Surgeon: [name], MD;  Location: [name];  Service: Cardiovascular;  Laterality: N/A;   Family History  Problem Relation Age of Onset  . Alzheimer's disease Mother   . Alzheimer's disease Father    Social History   Tobacco Use  . Smoking status: Former Smoker    Quit date: 04/21/2017    Years since quitting: 3.0  . Smokeless tobacco: Never Used  Substance Use Topics  . Alcohol use: Yes    Alcohol/week: 3.0 standard drinks    Types: 3 Cans of beer per week    Comment: Drink a half of a 40oz beer every day, drank heavily in the past   No Known Allergies    Review of Systems  Constitutional: Negative for appetite change and fatigue.  HENT: Negative for congestion, postnasal drip and sore throat.   Eyes: Negative.   Respiratory: Negative for chest tightness and shortness of breath.   Cardiovascular: Negative for chest pain, palpitations and leg swelling.  Gastrointestinal: Negative for abdominal distention and abdominal pain.  Endocrine: Negative.   Genitourinary: Negative.   Musculoskeletal: Negative for back pain and neck pain.  Skin: Negative.   Allergic/Immunologic: Negative.   Neurological: Negative for dizziness and light-headedness.  Hematological: Negative for adenopathy. Does not bruise/bleed easily.  Psychiatric/Behavioral: Negative for dysphoric mood and sleep disturbance (sleeping on 2 pillows). The patient is not nervous/anxious.       Physical Exam Vitals and nursing note reviewed.  Constitutional:      Appearance: He is well-developed.  HENT:     Head: Normocephalic and atraumatic.  Neck:     Vascular: No JVD.  Cardiovascular:     Rate and Rhythm: Normal rate and regular rhythm.  Pulmonary:     Effort: Pulmonary effort is normal. No respiratory distress.     Breath sounds: No  wheezing or rales.  Abdominal:     General: There is no distension.     Palpations: Abdomen is soft.     Tenderness: There is no abdominal tenderness.  Musculoskeletal:        General: No tenderness.     Cervical back: Normal range of motion and neck supple.  Skin:    General: Skin is warm and dry.  Neurological:     Mental Status: He is alert and oriented to person, place, and time.  Psychiatric:        Behavior: Behavior normal.        Thought Content: Thought content normal.     Assessment & Plan:  1: Chronic heart failure with mildly reduced ejection fraction- - NYHA class I - euvolemic today - weighing daily; Reviewed the importance of calling for any overnight weight gain of >2 pounds or a weekly weight gain of >5 pounds - weight 246 pounds from last visit here 6 months ago  - says that he's watching his diet more closely and has become more active; getting out of the house more as he now has a scooter so isn't staying home "eating all the time" -  not adding salt to his food and tries to eat low sodium foods. Reminded to closely follow a '2000mg'$  sodium diet  - saw cardiology Rockey Situ) 01/14/20 - BNP on 04/04/20 was 138.8  2: HTN- - BP - seeing PCP (Tejan-Sie) 11/04/19 - BMP from 04/08/20 reviewed and showed sodium 138, potassium 4.2, creatinine 1.17 and GFR 42  3: Atrial fibrillation- - took last dose of eliquis this morning and is getting medication delivered today   Medication bottles were reviewed.

## 2020-04-24 ENCOUNTER — Ambulatory Visit: Payer: Medicare Other | Admitting: Family

## 2020-04-25 ENCOUNTER — Ambulatory Visit: Payer: Medicare Other | Admitting: Family

## 2020-05-03 ENCOUNTER — Ambulatory Visit: Payer: Medicare Other | Attending: Family | Admitting: Family

## 2020-05-03 ENCOUNTER — Encounter: Payer: Self-pay | Admitting: Family

## 2020-05-03 ENCOUNTER — Ambulatory Visit: Payer: Medicaid Other | Admitting: Family

## 2020-05-03 ENCOUNTER — Other Ambulatory Visit: Payer: Self-pay

## 2020-05-03 VITALS — BP 162/92 | HR 72 | Resp 18 | Ht 77.0 in | Wt 224.0 lb

## 2020-05-03 DIAGNOSIS — M79662 Pain in left lower leg: Secondary | ICD-10-CM | POA: Diagnosis not present

## 2020-05-03 DIAGNOSIS — Z7901 Long term (current) use of anticoagulants: Secondary | ICD-10-CM | POA: Insufficient documentation

## 2020-05-03 DIAGNOSIS — I5022 Chronic systolic (congestive) heart failure: Secondary | ICD-10-CM | POA: Insufficient documentation

## 2020-05-03 DIAGNOSIS — I130 Hypertensive heart and chronic kidney disease with heart failure and stage 1 through stage 4 chronic kidney disease, or unspecified chronic kidney disease: Secondary | ICD-10-CM | POA: Diagnosis not present

## 2020-05-03 DIAGNOSIS — M79661 Pain in right lower leg: Secondary | ICD-10-CM | POA: Diagnosis not present

## 2020-05-03 DIAGNOSIS — Z87891 Personal history of nicotine dependence: Secondary | ICD-10-CM | POA: Diagnosis not present

## 2020-05-03 DIAGNOSIS — N189 Chronic kidney disease, unspecified: Secondary | ICD-10-CM | POA: Diagnosis not present

## 2020-05-03 DIAGNOSIS — Z79899 Other long term (current) drug therapy: Secondary | ICD-10-CM | POA: Insufficient documentation

## 2020-05-03 DIAGNOSIS — I480 Paroxysmal atrial fibrillation: Secondary | ICD-10-CM | POA: Insufficient documentation

## 2020-05-03 DIAGNOSIS — I10 Essential (primary) hypertension: Secondary | ICD-10-CM

## 2020-05-03 LAB — CBC WITH DIFFERENTIAL/PLATELET
Abs Immature Granulocytes: 0.02 10*3/uL (ref 0.00–0.07)
Basophils Absolute: 0 10*3/uL (ref 0.0–0.1)
Basophils Relative: 1 %
Eosinophils Absolute: 0.1 10*3/uL (ref 0.0–0.5)
Eosinophils Relative: 2 %
HCT: 32.2 % — ABNORMAL LOW (ref 39.0–52.0)
Hemoglobin: 10.2 g/dL — ABNORMAL LOW (ref 13.0–17.0)
Immature Granulocytes: 0 %
Lymphocytes Relative: 20 %
Lymphs Abs: 1 10*3/uL (ref 0.7–4.0)
MCH: 25.6 pg — ABNORMAL LOW (ref 26.0–34.0)
MCHC: 31.7 g/dL (ref 30.0–36.0)
MCV: 80.7 fL (ref 80.0–100.0)
Monocytes Absolute: 0.5 10*3/uL (ref 0.1–1.0)
Monocytes Relative: 10 %
Neutro Abs: 3.4 10*3/uL (ref 1.7–7.7)
Neutrophils Relative %: 67 %
Platelets: 357 10*3/uL (ref 150–400)
RBC: 3.99 MIL/uL — ABNORMAL LOW (ref 4.22–5.81)
RDW: 20.1 % — ABNORMAL HIGH (ref 11.5–15.5)
WBC: 5.1 10*3/uL (ref 4.0–10.5)
nRBC: 0 % (ref 0.0–0.2)

## 2020-05-03 NOTE — Progress Notes (Signed)
Patient ID: Rodger Malick., male    DOB: 07-27-1950, 70 y.o.   MRN: [id number]  HPI  Mr Wysong is a 70 y/o male with a history of HTN, stroke, previous tobacco use and chronic heart failure.   Echo report from 07/01/2018 reviewed and showed an EF of 40-45%. Echo report from 09/04/17 reviewed and showed an EF of 35-40%. Echo report from 06/16/17 reviewed and showed an EF of 15-20% along with mod-severe TR and an elevated PA pressure of 45 mm Hg.  Cardiac catheterization done 07/18/17 and showed heavily calcified coronary arteries with mild nonobstructive disease.  Left dominant system. Right heart catheterization showed moderately to severely elevated filling pressures, severe pulmonary hypertension and severely reduced cardiac output.   Admitted 04/04/20 due to worsening weakness found to be anemic with hemoglobin of 7.4. GI consult obtained. Head CT negative. Given 1 unit of PRBC's along with venofer IV. EGD and colonoscopy done with removal of a polyp. Diuretic held due to hypotension. PT consult obtained. Discharged after 4 days.    He presents today for a follow-up visit with a chief complaint of bilateral lower leg pain which makes it difficult for him to safely walk or stand for long periods of time. Says that this has been present for several months and he's going to be receiving home PT. He's currently using a scooter to get around so that he doesn't fall using his walker. He denies any other symptoms and specifically denies any fatigue, cough, shortness of breath, chest pain, pedal edema, palpitations, abdominal distention, dizziness, difficulty sleeping or weight gain.   Says that he's been off his eliquis since his hospital discharge on 04/08/20. He says that he was supposed to get lab work done ~ a week after his discharge to decide about whether to resume his eliquis but due to transportation issues, his labs have never been done.   Past Medical History:  Diagnosis Date  . Atrial flutter ([place])     a. s/p TEE/DCCV 04/2017; b. 06/2017 s/p RFCA; c. CHADS2VASc => 5 (CHF, HTN, age x 1, CVA)-->noncompliant with Xarelto.  . Chronic combined systolic and diastolic CHF (congestive heart failure) ([place])    a. TTE 2/19: EF 20-25%, diffuse HK; b. TTE 3/19: EF 20-25%, diffuse HK; c. 08/2017 Echo: EF 35-40%, diff HK. Gr1 DD; d. 06/2018 Echo: EF 40-45%, DD. Neg bubble study.  . CKD (chronic kidney disease), stage II-III   . Essential hypertension   . GI bleed    a.  Hemorrhagic shock in 11/19 secondary to erosive gastropathy and Barrett's esophagus requiring multiple units PRBCs; b. Cleared to resume OAC->pt did not.  Marland Kitchen NICM (nonischemic cardiomyopathy) ([place])    a. 04/2017: Echo EF 20-25%; b. TTE 3/19: EF 20-25%; c. 07/2017 Cath: min irregs; d. 08/2017 Echo: EF 35-40%, diff HK; e. 06/2018 Echo: EF 40-45%, DD.  Marland Kitchen Noncompliance with medications   . PAF (paroxysmal atrial fibrillation) ([place])    a. 07/2017 afib->broke w/ IV amio->converted to oral bb due to prior intol to oral amio.  . Pulmonary hypertension ([place])   . Stroke [facility])    a. 06/2018 MRI/A Brain: R paramedian pons late acute/early subacute infarct, 18m. No assoc hemorrhage or mass effect.  Sev chronic microvascular isch changtes and mod voluem loss. No large vessel occlusion, aneurysm, or significant stenosis.   Past Surgical History:  Procedure Laterality Date  . A-FLUTTER ABLATION N/A 06/16/2017   Procedure: A-FLUTTER ABLATION;  Surgeon: TEvans Lance MD;  [facility];  Service: Cardiovascular;  Laterality: N/A;  . CARDIAC CATHETERIZATION    . CARDIOVERSION N/A 05/01/2017   Procedure: CARDIOVERSION;  Surgeon: [name], MD;  Location: [facility] ORS;  Service: Cardiovascular;  Laterality: N/A;  . COLONOSCOPY WITH PROPOFOL N/A 04/07/2020   Procedure: COLONOSCOPY WITH PROPOFOL;  Surgeon: [name], MD;  Location: [facility] ENDOSCOPY;  Service: Endoscopy;  Laterality: N/A;  . CORONARY ANGIOPLASTY    .  ESOPHAGOGASTRODUODENOSCOPY N/A 04/07/2020   Procedure: ESOPHAGOGASTRODUODENOSCOPY (EGD);  Surgeon: [name], MD;  Location: [facility] ENDOSCOPY;  Service: Endoscopy;  Laterality: N/A;  . ESOPHAGOGASTRODUODENOSCOPY (EGD) WITH PROPOFOL N/A 02/09/2018   Procedure: ESOPHAGOGASTRODUODENOSCOPY (EGD) WITH PROPOFOL;  Surgeon: [name], MD;  Location: [facility] ENDOSCOPY;  Service: Endoscopy;  Laterality: N/A;  . RIGHT/LEFT HEART CATH AND CORONARY ANGIOGRAPHY N/A 07/18/2017   Procedure: RIGHT/LEFT HEART CATH AND CORONARY ANGIOGRAPHY;  Surgeon: [name], MD;  Location: [place] CV LAB;  Service: Cardiovascular;  Laterality: N/A;  . TEE WITHOUT CARDIOVERSION N/A 05/01/2017   Procedure: TRANSESOPHAGEAL ECHOCARDIOGRAM (TEE);  Surgeon: [name], MD;  Location: [facility] ORS;  Service: Cardiovascular;  Laterality: N/A;  . TEE WITHOUT CARDIOVERSION N/A 06/16/2017   Procedure: TRANSESOPHAGEAL ECHOCARDIOGRAM (TEE);  Surgeon: [name], MD;  Location: [facility] ENDOSCOPY;  Service: Cardiovascular;  Laterality: N/A;   Family History  Problem Relation Age of Onset  . Alzheimer's disease Mother   . Alzheimer's disease Father    Social History   Tobacco Use  . Smoking status: Former Smoker    Quit date: 04/21/2017    Years since quitting: 3.0  . Smokeless tobacco: Never Used  Substance Use Topics  . Alcohol use: Yes    Alcohol/week: 3.0 standard drinks    Types: 3 Cans of beer per week    Comment: Drink a half of a 40oz beer every day, drank heavily in the past   No Known Allergies  Prior to Admission medications   Medication Sig Start Date End Date Taking? Authorizing Provider  acetaminophen (TYLENOL) 325 MG tablet Take 2 tablets (650 mg total) by mouth every 6 (six) hours as needed for fever, headache or moderate pain. 04/08/20  Yes Val Riles, MD  atorvastatin (LIPITOR) 80 MG tablet TAKE 1 TABLET BY MOUTH ONCE DAILY Patient taking differently: Take 80 mg by mouth daily. TAKE  1 TABLET BY MOUTH ONCE DAILY 09/10/19  Yes Theora Gianotti, NP  diclofenac Sodium (VOLTAREN) 1 % GEL Apply 2 g topically every 6 (six) hours as needed (pain). 04/08/20  Yes Val Riles, MD  furosemide (LASIX) 40 MG tablet Take 1 tablet (40 mg total) by mouth daily. 04/08/20  Yes Val Riles, MD  losartan (COZAAR) 25 MG tablet Take 1 tablet (25 mg total) by mouth daily. 04/08/20  Yes Val Riles, MD  metoprolol succinate (TOPROL-XL) 50 MG 24 hr tablet TAKE 1 TABLET BY MOUTH ONCE DAILY. TAKE WITH OR IMMEDIATELY FOLLOWING A MEAL. Patient taking differently: Take 50 mg by mouth daily. TAKE 1 TABLET BY MOUTH ONCE DAILY. TAKE WITH OR IMMEDIATELY FOLLOWING A MEAL. 06/29/19  Yes Gollan, Kathlene November, MD  pantoprazole (PROTONIX) 40 MG tablet TAKE 1 TABLET BY MOUTH IN THE MORNING AT  6  AM Patient taking differently: Take 40 mg by mouth daily. TAKE 1 TABLET BY MOUTH IN THE MORNING AT  6  AM 12/08/19  Yes Gollan, Kathlene November, MD  spironolactone (ALDACTONE) 25 MG tablet Take 1 tablet (25 mg total) by mouth daily. 12/08/19  Yes Gollan, Kathlene November, MD  apixaban (ELIQUIS) 5 MG TABS tablet Take 1 tablet (5 mg total) by mouth 2 (two) times daily. Patient not taking: Reported on 05/03/2020 04/12/20   Val Riles, MD  iron polysaccharides (NIFEREX) 150 MG capsule Take 1 capsule (150 mg total) by mouth daily. Patient not taking: Reported on 05/03/2020 04/08/20   Val Riles, MD  magnesium oxide (MAG-OX) 400 (241.3 Mg) MG tablet Take 1 tablet (400 mg total) by mouth 2 (two) times daily. Patient not taking: Reported on 05/03/2020 04/08/20   Val Riles, MD  tiZANidine (ZANAFLEX) 2 MG tablet Take 2 mg by mouth 3 (three) times daily. Patient not taking: Reported on 05/03/2020 03/01/20   [provider]  vitamin B-12 (CYANOCOBALAMIN) 500 MCG tablet Take 1 tablet (500 mcg total) by mouth daily. Patient not taking: Reported on 05/03/2020 04/08/20   Val Riles, MD  vitamin C (VITAMIN C) 500 MG tablet Take 0.5  tablets (250 mg total) by mouth daily. Patient not taking: Reported on 05/03/2020 04/08/20   Val Riles, MD  Vitamin D, Ergocalciferol, (DRISDOL) 1.25 MG (50000 UNIT) CAPS capsule Take 1 capsule (50,000 Units total) by mouth every 7 (seven) days for 7 doses. Patient not taking: Reported on 05/03/2020 04/12/20 05/25/20  Val Riles, MD    Review of Systems  Constitutional: Negative for appetite change and fatigue.  HENT: Negative for congestion, postnasal drip and sore throat.   Eyes: Negative.   Respiratory: Negative for chest tightness and shortness of breath.   Cardiovascular: Negative for chest pain, palpitations and leg swelling.  Gastrointestinal: Negative for abdominal distention and abdominal pain.  Endocrine: Negative.   Genitourinary: Negative.   Musculoskeletal: Positive for arthralgias (both legs at times). Negative for back pain and neck pain.  Skin: Negative.   Allergic/Immunologic: Negative.   Neurological: Negative for dizziness and light-headedness.  Hematological: Negative for adenopathy. Does not bruise/bleed easily.  Psychiatric/Behavioral: Negative for dysphoric mood and sleep disturbance (sleeping on 2 pillows). The patient is not nervous/anxious.    Vitals:   05/03/20 0944  BP: (!) 162/92  Pulse: 72  Resp: 18  SpO2: 100%  Weight: 224 lb (101.6 kg)  Height: '6\' 5"'$  (1.956 m)   Wt Readings from Last 3 Encounters:  05/03/20 224 lb (101.6 kg)  04/08/20 220 lb 7.4 oz (100 kg)  01/14/20 239 lb (108.4 kg)   Lab Results  Component Value Date   CREATININE 1.17 04/08/2020   CREATININE 1.18 04/07/2020   CREATININE 1.30 (H) 04/06/2020    Physical Exam Vitals and nursing note reviewed.  Constitutional:      Appearance: He is well-developed.  HENT:     Head: Normocephalic and atraumatic.  Neck:     Vascular: No JVD.  Cardiovascular:     Rate and Rhythm: Normal rate and regular rhythm.  Pulmonary:     Effort: Pulmonary effort is normal. No respiratory  distress.     Breath sounds: No wheezing or rales.  Abdominal:     General: There is no distension.     Palpations: Abdomen is soft.     Tenderness: There is no abdominal tenderness.  Musculoskeletal:        General: No tenderness.     Cervical back: Normal range of motion and neck supple.  Skin:    General: Skin is warm and dry.  Neurological:     Mental Status: He is alert and oriented to person, place, and time.  Psychiatric:  Behavior: Behavior normal.        Thought Content: Thought content normal.     Assessment & Plan:  1: Chronic heart failure with mildly reduced ejection fraction- - NYHA class I - euvolemic today - weighing daily; Reviewed the importance of calling for any overnight weight gain of >2 pounds or a weekly weight gain of >5 pounds - weight down 22 pounds from last visit here 6 months ago  - continues to watch his diet closely; getting out of the house more as he now has a scooter so isn't staying home "eating all the time" - not adding salt to his food and tries to eat low sodium foods. Reminded to closely follow a '2000mg'$  sodium diet  - saw cardiology Rockey Situ) 01/14/20 - currently has home health nursing/ PT - BNP on 04/04/20 was 138.8  2: HTN- - BP elevated today but he had some difficulty with transportation today; he says that home health nurse checks it and says that it's "been good" - seeing PCP (Tejan-Sie) 05/05/20 - BMP from 04/08/20 reviewed and showed sodium 138, potassium 4.2, creatinine 1.17 and GFR 42  3: Atrial fibrillation- - says that he's been off his eliquis since his discharge on 04/08/20 as he was supposed to have CBC ~ 1 week after d/c - due to transportation issues, it's never been checked so he has remained off of it - will get CBC today and let patient know after results are obtained whether to resume his eliquis  - saw EP [name]) 12/16/19   Medication bottles were reviewed.  Return in 3 months or sooner for any  questions/problems before then.

## 2020-05-03 NOTE — Patient Instructions (Signed)
Continue weighing daily and call for an overnight weight gain of > 2 pounds or a weekly weight gain of >5 pounds. 

## 2020-05-04 ENCOUNTER — Telehealth: Payer: Self-pay | Admitting: Family

## 2020-05-04 DIAGNOSIS — D509 Iron deficiency anemia, unspecified: Secondary | ICD-10-CM

## 2020-05-04 NOTE — Telephone Encounter (Signed)
Spoke with Dr. Rockey Situ regarding resumption of apixaban after getting CBC results back. Advised patient to resume taking his eliquis as '5mg'$  twice daily. Also advised patient to start taking 1 iron tablet daily.   Also advised that we would be referring to hematology for his iron deficiency anemia to decide about any further work up if needed.   Answered questions and patient verbalized understanding.

## 2020-05-09 ENCOUNTER — Telehealth: Payer: Self-pay | Admitting: Family

## 2020-05-09 ENCOUNTER — Other Ambulatory Visit: Payer: Self-pay | Admitting: Family

## 2020-05-09 DIAGNOSIS — D509 Iron deficiency anemia, unspecified: Secondary | ICD-10-CM

## 2020-05-09 NOTE — Telephone Encounter (Signed)
Spoke with patients daughter to notify her of his upcomming anemia appointment that we made for him as we were unable to reach patient.   Jahvon Gosline, NT

## 2020-05-16 ENCOUNTER — Inpatient Hospital Stay: Payer: Medicare Other

## 2020-05-16 ENCOUNTER — Ambulatory Visit: Payer: Medicaid Other | Admitting: Family

## 2020-05-16 ENCOUNTER — Inpatient Hospital Stay: Payer: Medicare Other | Attending: Oncology | Admitting: Oncology

## 2020-05-19 ENCOUNTER — Other Ambulatory Visit: Payer: Self-pay | Admitting: Cardiovascular Disease

## 2020-07-29 NOTE — Progress Notes (Signed)
Nathan [name] ID: Nathan Vietmeier., male    DOB: October 18, 1950, 70 y.o.   MRN: [id number]  HPI  Nathan [name] is a 70 y/o male with a history of HTN, stroke, previous tobacco use and chronic heart failure.   Echo report from 07/01/2018 reviewed and showed an EF of 40-45%. Echo report from 09/04/17 reviewed and showed an EF of 35-40%. Echo report from 06/16/17 reviewed and showed an EF of 15-20% along with mod-severe TR and an elevated PA pressure of 45 mm Hg.  Cardiac catheterization done 07/18/17 and showed heavily calcified coronary arteries with mild nonobstructive disease.  Left dominant system. Right heart catheterization showed moderately to severely elevated filling pressures, severe pulmonary hypertension and severely reduced cardiac output.   Admitted 04/04/20 due to worsening weakness found to be anemic with hemoglobin of 7.4. GI consult obtained. Head CT negative. Given 1 unit of PRBC's along with venofer IV. EGD and colonoscopy done with removal of a polyp. Diuretic held due to hypotension. PT consult obtained. Discharged after 4 days.    Nathan [name] presents today for a follow-up visit with a chief complaint of slight weight gain. Nathan [name] says that this has occurred slowly over several months. Nathan [name] has no other symptoms and specifically denies any difficulty sleeping, dizziness, abdominal distention, palpitations, pedal edema, chest pain, shortness of breath or fatigue.   Says that Nathan [name]'s having a lot of difficulty with his phone and has no way, currently, to use it. Does report being under stress as Nathan [name] will be having to move during the summer because his current place was sold and his rent is going up. Has applied for section 9 housing.   Past Medical History:  Diagnosis Date  . Atrial flutter ([place])    a. s/p TEE/DCCV 04/2017; b. 06/2017 s/p RFCA; c. CHADS2VASc => 5 (CHF, HTN, age x 1, CVA)-->noncompliant with Xarelto.  . Chronic combined systolic and diastolic CHF (congestive heart failure) ([place])    a. TTE 2/19: EF  20-25%, diffuse HK; b. TTE 3/19: EF 20-25%, diffuse HK; c. 08/2017 Echo: EF 35-40%, diff HK. Gr1 DD; d. 06/2018 Echo: EF 40-45%, DD. Neg bubble study.  . CKD (chronic kidney disease), stage II-III   . Essential hypertension   . GI bleed    a.  Hemorrhagic shock in 11/19 secondary to erosive gastropathy and Barrett's esophagus requiring multiple units PRBCs; b. Cleared to resume OAC->pt did not.  Marland Kitchen NICM (nonischemic cardiomyopathy) ([place])    a. 04/2017: Echo EF 20-25%; b. TTE 3/19: EF 20-25%; c. 07/2017 Cath: min irregs; d. 08/2017 Echo: EF 35-40%, diff HK; e. 06/2018 Echo: EF 40-45%, DD.  Marland Kitchen Noncompliance with medications   . PAF (paroxysmal atrial fibrillation) ([place])    a. 07/2017 afib->broke w/ IV amio->converted to oral bb due to prior intol to oral amio.  . Pulmonary hypertension ([place])   . Stroke [facility])    a. 06/2018 MRI/A Brain: R paramedian pons late acute/early subacute infarct, 51m. No assoc hemorrhage or mass effect.  Sev chronic microvascular isch changtes and mod voluem loss. No large vessel occlusion, aneurysm, or significant stenosis.   Past Surgical History:  Procedure Laterality Date  . A-FLUTTER ABLATION N/A 06/16/2017   Procedure: A-FLUTTER ABLATION;  Surgeon: TEvans Lance MD;  [place] [facility];  Service: Cardiovascular;  Laterality: N/A;  . CARDIAC CATHETERIZATION    . CARDIOVERSION N/A 05/01/2017   Procedure: CARDIOVERSION;  Surgeon: GMinna Merritts MD;  Location: [facility] ORS;  Service: Cardiovascular;  Laterality: N/A;  .  COLONOSCOPY WITH PROPOFOL N/A 04/07/2020   Procedure: COLONOSCOPY WITH PROPOFOL;  Surgeon: [name], MD;  Location: [facility] ENDOSCOPY;  Service: Endoscopy;  Laterality: N/A;  . CORONARY ANGIOPLASTY    . ESOPHAGOGASTRODUODENOSCOPY N/A 04/07/2020   Procedure: ESOPHAGOGASTRODUODENOSCOPY (EGD);  Surgeon: [name], MD;  Location: [facility] ENDOSCOPY;  Service: Endoscopy;  Laterality: N/A;  . ESOPHAGOGASTRODUODENOSCOPY (EGD) WITH PROPOFOL N/A  02/09/2018   Procedure: ESOPHAGOGASTRODUODENOSCOPY (EGD) WITH PROPOFOL;  Surgeon: [name], MD;  Location: [facility] ENDOSCOPY;  Service: Endoscopy;  Laterality: N/A;  . RIGHT/LEFT HEART CATH AND CORONARY ANGIOGRAPHY N/A 07/18/2017   Procedure: RIGHT/LEFT HEART CATH AND CORONARY ANGIOGRAPHY;  Surgeon: [name], MD;  Location: [place] CV LAB;  Service: Cardiovascular;  Laterality: N/A;  . TEE WITHOUT CARDIOVERSION N/A 05/01/2017   Procedure: TRANSESOPHAGEAL ECHOCARDIOGRAM (TEE);  Surgeon: [name], MD;  Location: [facility] ORS;  Service: Cardiovascular;  Laterality: N/A;  . TEE WITHOUT CARDIOVERSION N/A 06/16/2017   Procedure: TRANSESOPHAGEAL ECHOCARDIOGRAM (TEE);  Surgeon: [name], MD;  Location: [facility] ENDOSCOPY;  Service: Cardiovascular;  Laterality: N/A;   Family History  Problem Relation Age of Onset  . Alzheimer's disease Mother   . Alzheimer's disease Father    Social History   Tobacco Use  . Smoking status: Former Smoker    Quit date: 04/21/2017    Years since quitting: 3.2  . Smokeless tobacco: Never Used  Substance Use Topics  . Alcohol use: Yes    Alcohol/week: 3.0 standard drinks    Types: 3 Cans of beer per week    Comment: Drink a half of a 40oz beer every day, drank heavily in the past   No Known Allergies  Prior to Admission medications   Medication Sig Start Date End Date Taking? Authorizing Provider  acetaminophen (TYLENOL) 325 MG tablet Take 2 tablets (650 mg total) by mouth every 6 (six) hours as needed for fever, headache or moderate pain. 04/08/20  Yes Val Riles, MD  apixaban (ELIQUIS) 5 MG TABS tablet Take 5 mg by mouth 2 (two) times daily. 04/12/20  Yes Val Riles, MD  atorvastatin (LIPITOR) 80 MG tablet TAKE 1 TABLET BY MOUTH ONCE DAILY Nathan [name] taking differently: Take 80 mg by mouth daily. TAKE 1 TABLET BY MOUTH ONCE DAILY 09/10/19  Yes Theora Gianotti, NP  furosemide (LASIX) 40 MG tablet Take 1 tablet (40 mg total)  by mouth daily. 04/08/20  Yes Val Riles, MD  losartan (COZAAR) 25 MG tablet Take 1 tablet (25 mg total) by mouth daily. 04/08/20  Yes Val Riles, MD  metoprolol succinate (TOPROL-XL) 50 MG 24 hr tablet TAKE 1 TABLET BY MOUTH ONCE DAILY. TAKE WITH OR IMMEDIATELY FOLLOWING A MEAL. 06/29/19  Yes Gollan, Kathlene November, MD  vitamin C (VITAMIN C) 500 MG tablet Take 0.5 tablets (250 mg total) by mouth daily. 04/08/20  Yes Val Riles, MD  iron polysaccharides (NIFEREX) 150 MG capsule Take 1 capsule (150 mg total) by mouth daily. Nathan [name] not taking: No sig reported 04/08/20   Val Riles, MD  spironolactone (ALDACTONE) 25 MG tablet Take 1 tablet (25 mg total) by mouth daily. Nathan [name] not taking: Reported on 07/31/2020 12/08/19   [name], MD  vitamin B-12 (CYANOCOBALAMIN) 500 MCG tablet Take 1 tablet (500 mcg total) by mouth daily. Nathan [name] not taking: No sig reported 04/08/20   Val Riles, MD    Review of Systems  Constitutional: Negative for appetite change and fatigue.  HENT: Negative for congestion, postnasal drip and sore throat.  Eyes: Negative.   Respiratory: Negative for chest tightness and shortness of breath.   Cardiovascular: Negative for chest pain, palpitations and leg swelling.  Gastrointestinal: Negative for abdominal distention and abdominal pain.  Endocrine: Negative.   Genitourinary: Negative.   Musculoskeletal: Negative for arthralgias, back pain and neck pain.  Skin: Negative.   Allergic/Immunologic: Negative.   Neurological: Negative for dizziness and light-headedness.  Hematological: Negative for adenopathy. Does not bruise/bleed easily.  Psychiatric/Behavioral: Negative for dysphoric mood and sleep disturbance (sleeping on 2 pillows). The Nathan [name] is not nervous/anxious.    Vitals:   07/31/20 0951 07/31/20 1006  BP: (!) 191/104 (!) 180/90  Pulse: 81   Resp: 20   SpO2: 100%   Weight: 229 lb 4 oz (104 kg)   Height: '6\' 5"'$  (1.956 m)    Wt Readings from Last 3  Encounters:  07/31/20 229 lb 4 oz (104 kg)  05/03/20 224 lb (101.6 kg)  04/08/20 220 lb 7.4 oz (100 kg)   Lab Results  Component Value Date   CREATININE 1.17 04/08/2020   CREATININE 1.18 04/07/2020   CREATININE 1.30 (H) 04/06/2020    Physical Exam Vitals and nursing note reviewed.  Constitutional:      Appearance: Nathan [name] is well-developed.  HENT:     Head: Normocephalic and atraumatic.  Neck:     Vascular: No JVD.  Cardiovascular:     Rate and Rhythm: Normal rate and regular rhythm.  Pulmonary:     Effort: Pulmonary effort is normal. No respiratory distress.     Breath sounds: No wheezing or rales.  Abdominal:     General: There is no distension.     Palpations: Abdomen is soft.     Tenderness: There is no abdominal tenderness.  Musculoskeletal:        General: No tenderness.     Cervical back: Normal range of motion and neck supple.  Skin:    General: Skin is warm and dry.  Neurological:     Mental Status: Nathan [name] is alert and oriented to person, place, and time.  Psychiatric:        Behavior: Behavior normal.        Thought Content: Thought content normal.    Assessment & Plan:  1: Chronic heart failure with mildly reduced ejection fraction- - NYHA class I - euvolemic today - weighing daily; Reviewed the importance of calling for any overnight weight gain of >2 pounds or a weekly weight gain of >5 pounds - weight up 5 pounds from last visit here 3 months ago  - continues to watch his diet closely; getting out of the house more as Nathan [name] now has a scooter so isn't staying home "eating all the time" - not adding salt to his food and tries to eat low sodium foods. Reminded to closely follow a '2000mg'$  sodium diet  - saw cardiology Rockey Situ) 01/14/20 - updated echo scheduled for 08/29/20 - Nathan [name] is very unsure of his medications and says that there's no way for him to call back with the medication names  - BNP on 04/04/20 was 138.8  2: HTN- - BP elevated even after recheck; will  have Nathan [name] finish his losartan (Nathan [name] thinks Nathan [name] only has a few tablets left) and then begin entresto as 1 tablet BID; 30 day voucher given to Nathan [name] and Nathan [name] assistance paperwork filled out for him today - will check BMP next visit - seeing PCP (Tejan-Sie) 05/05/20 - BMP from 04/08/20 reviewed and showed sodium 138, potassium 4.2, creatinine 1.17  and GFR 42  3: Atrial fibrillation- - saw EP [name]) 12/16/19  4: Anemia- - NS for hematology appointment on 05/16/20; this was rescheduled for 08/07/20 - says that Nathan [name]'s no longer taking iron or vitamin B12 but can't give a reason as to why - [employer] transportation has been scheduled for this appointment - emphasized that Nathan [name] bring his medication bottles to every visit every time   Nathan [name] did not bring his medications nor a list. Each medication was verbally reviewed with the Nathan [name] and Nathan [name] was encouraged to bring the bottles to every visit to confirm accuracy of list.  Return in 1 month or sooner for any questions/problems before then.

## 2020-07-31 ENCOUNTER — Other Ambulatory Visit: Payer: Self-pay

## 2020-07-31 ENCOUNTER — Ambulatory Visit: Payer: Medicare Other | Attending: Family | Admitting: Family

## 2020-07-31 ENCOUNTER — Encounter: Payer: Self-pay | Admitting: Family

## 2020-07-31 VITALS — BP 180/90 | HR 81 | Resp 20 | Ht 77.0 in | Wt 229.2 lb

## 2020-07-31 DIAGNOSIS — I4891 Unspecified atrial fibrillation: Secondary | ICD-10-CM | POA: Insufficient documentation

## 2020-07-31 DIAGNOSIS — Z79899 Other long term (current) drug therapy: Secondary | ICD-10-CM | POA: Diagnosis not present

## 2020-07-31 DIAGNOSIS — Z7901 Long term (current) use of anticoagulants: Secondary | ICD-10-CM | POA: Insufficient documentation

## 2020-07-31 DIAGNOSIS — H93293 Other abnormal auditory perceptions, bilateral: Secondary | ICD-10-CM | POA: Diagnosis not present

## 2020-07-31 DIAGNOSIS — Z87891 Personal history of nicotine dependence: Secondary | ICD-10-CM | POA: Insufficient documentation

## 2020-07-31 DIAGNOSIS — I5022 Chronic systolic (congestive) heart failure: Secondary | ICD-10-CM | POA: Diagnosis not present

## 2020-07-31 DIAGNOSIS — I480 Paroxysmal atrial fibrillation: Secondary | ICD-10-CM

## 2020-07-31 DIAGNOSIS — I272 Pulmonary hypertension, unspecified: Secondary | ICD-10-CM | POA: Diagnosis not present

## 2020-07-31 DIAGNOSIS — D649 Anemia, unspecified: Secondary | ICD-10-CM | POA: Diagnosis not present

## 2020-07-31 DIAGNOSIS — H6123 Impacted cerumen, bilateral: Secondary | ICD-10-CM | POA: Diagnosis not present

## 2020-07-31 DIAGNOSIS — Z8673 Personal history of transient ischemic attack (TIA), and cerebral infarction without residual deficits: Secondary | ICD-10-CM | POA: Diagnosis not present

## 2020-07-31 DIAGNOSIS — I130 Hypertensive heart and chronic kidney disease with heart failure and stage 1 through stage 4 chronic kidney disease, or unspecified chronic kidney disease: Secondary | ICD-10-CM | POA: Insufficient documentation

## 2020-07-31 DIAGNOSIS — I10 Essential (primary) hypertension: Secondary | ICD-10-CM

## 2020-07-31 MED ORDER — SACUBITRIL-VALSARTAN 24-26 MG PO TABS: 1.0000 | ORAL_TABLET | Freq: Two times a day (BID) | ORAL | 3 refills | Status: DC

## 2020-07-31 NOTE — Patient Instructions (Addendum)
Continue weighing daily and call for an overnight weight gain of > 2 pounds or a weekly weight gain of >5 pounds.   Bring ALL medications including any over the counter or vitamins to every visit.    Finish taking your losartan and once you finish it, you will start taking entresto as 1 tablet in the morning and 1 tablet in the evening

## 2020-08-02 ENCOUNTER — Telehealth: Payer: Self-pay | Admitting: Family

## 2020-08-02 NOTE — Telephone Encounter (Signed)
Called patient and daughter but was unable to speak to or lvm for them to notify them that he was approved for patient assistance for Entresto till the end of the year and instructions on how to set that up.  Nathan [name], NT

## 2020-08-07 ENCOUNTER — Inpatient Hospital Stay: Payer: Medicare Other | Attending: Oncology | Admitting: Oncology

## 2020-08-07 ENCOUNTER — Inpatient Hospital Stay: Payer: Medicare Other

## 2020-08-07 ENCOUNTER — Encounter: Payer: Self-pay | Admitting: Oncology

## 2020-08-07 VITALS — BP 147/90 | HR 75 | Temp 97.8°F | Resp 18 | Wt 234.0 lb

## 2020-08-07 DIAGNOSIS — Z7901 Long term (current) use of anticoagulants: Secondary | ICD-10-CM | POA: Diagnosis not present

## 2020-08-07 DIAGNOSIS — Z86010 Personal history of colonic polyps: Secondary | ICD-10-CM | POA: Insufficient documentation

## 2020-08-07 DIAGNOSIS — Z87891 Personal history of nicotine dependence: Secondary | ICD-10-CM | POA: Diagnosis not present

## 2020-08-07 DIAGNOSIS — D649 Anemia, unspecified: Secondary | ICD-10-CM

## 2020-08-07 DIAGNOSIS — I4891 Unspecified atrial fibrillation: Secondary | ICD-10-CM | POA: Insufficient documentation

## 2020-08-07 LAB — COMPREHENSIVE METABOLIC PANEL
ALT: 12 U/L (ref 0–44)
AST: 16 U/L (ref 15–41)
Albumin: 4 g/dL (ref 3.5–5.0)
Alkaline Phosphatase: 63 U/L (ref 38–126)
Anion gap: 12 (ref 5–15)
BUN: 19 mg/dL (ref 8–23)
CO2: 23 mmol/L (ref 22–32)
Calcium: 9.4 mg/dL (ref 8.9–10.3)
Chloride: 102 mmol/L (ref 98–111)
Creatinine, Ser: 1.5 mg/dL — ABNORMAL HIGH (ref 0.61–1.24)
GFR, Estimated: 50 mL/min — ABNORMAL LOW (ref >60)
Glucose, Bld: 97 mg/dL (ref 70–99)
Potassium: 4.1 mmol/L (ref 3.5–5.1)
Sodium: 137 mmol/L (ref 135–145)
Total Bilirubin: 0.6 mg/dL (ref 0.3–1.2)
Total Protein: 8.5 g/dL — ABNORMAL HIGH (ref 6.5–8.1)

## 2020-08-07 LAB — CBC WITH DIFFERENTIAL/PLATELET
Abs Immature Granulocytes: 0.02 10*3/uL (ref 0.00–0.07)
Basophils Absolute: 0 10*3/uL (ref 0.0–0.1)
Basophils Relative: 0 %
Eosinophils Absolute: 0.2 10*3/uL (ref 0.0–0.5)
Eosinophils Relative: 4 %
HCT: 40 % (ref 39.0–52.0)
Hemoglobin: 13.3 g/dL (ref 13.0–17.0)
Immature Granulocytes: 0 %
Lymphocytes Relative: 21 %
Lymphs Abs: 1 10*3/uL (ref 0.7–4.0)
MCH: 28 pg (ref 26.0–34.0)
MCHC: 33.3 g/dL (ref 30.0–36.0)
MCV: 84.2 fL (ref 80.0–100.0)
Monocytes Absolute: 0.4 10*3/uL (ref 0.1–1.0)
Monocytes Relative: 9 %
Neutro Abs: 3 10*3/uL (ref 1.7–7.7)
Neutrophils Relative %: 66 %
Platelets: 258 10*3/uL (ref 150–400)
RBC: 4.75 MIL/uL (ref 4.22–5.81)
RDW: 15.1 % (ref 11.5–15.5)
WBC: 4.6 10*3/uL (ref 4.0–10.5)
nRBC: 0 % (ref 0.0–0.2)

## 2020-08-07 LAB — VITAMIN B12: Vitamin B-12: 129 pg/mL — ABNORMAL LOW (ref 180–914)

## 2020-08-07 LAB — TECHNOLOGIST SMEAR REVIEW
Plt Morphology: NORMAL
RBC MORPHOLOGY: NORMAL
WBC MORPHOLOGY: NORMAL

## 2020-08-07 LAB — RETIC PANEL
Immature Retic Fract: 9.8 % (ref 2.3–15.9)
RBC.: 4.71 MIL/uL (ref 4.22–5.81)
Retic Count, Absolute: 59.8 10*3/uL (ref 19.0–186.0)
Retic Ct Pct: 1.3 % (ref 0.4–3.1)
Reticulocyte Hemoglobin: 34.3 pg (ref >27.9)

## 2020-08-07 LAB — IRON AND TIBC
Iron: 54 ug/dL (ref 45–182)
Saturation Ratios: 16 % — ABNORMAL LOW (ref 17.9–39.5)
TIBC: 340 ug/dL (ref 250–450)
UIBC: 286 ug/dL

## 2020-08-07 LAB — TSH: TSH: 2.054 u[IU]/mL (ref 0.350–4.500)

## 2020-08-07 LAB — FERRITIN: Ferritin: 27 ng/mL (ref 24–336)

## 2020-08-07 LAB — FOLATE: Folate: 8.1 ng/mL (ref >5.9)

## 2020-08-07 NOTE — Progress Notes (Signed)
Hematology/Oncology Consult note [facility] [phone] [phone]   Patient Care Team: [name], MD as PCP - General (Internal Medicine) [name], MD as PCP - Cardiology (Cardiology)  REFERRING PROVIDER: Alisa Graff, FNP  CHIEF COMPLAINTS/REASON FOR VISIT:  Evaluation of anemia  HISTORY OF PRESENTING ILLNESS:  Nathan [name]. is a  70 y.o.  male with PMH listed below who was referred to me for evaluation of anemia Reviewed patient's recent labs that was done.  05/03/2020 labs revealed anemia with hemoglobin of 10.2, MCV 80.7. Patient was admitted from 04/04/2020 - 04/08/2020 due to acute on acute anemia, patient was on Eliquis supplementation for atrial fibrillation which was temporarily held during his admission.  Patient had a gastroenterology work-up status post EGD and colonoscopy.  1 polyp was removed.  Poor colon prep.  EGD normal.  Currently patient is back on Eliquis.  Associated signs and symptoms: Patient reports feeling pretty well.  Denies fatigue or SOB with exertion.  Denies weight loss, easy bruising, hematochezia, hemoptysis, hematuria. Context: History of GI bleeding: Denies any black or bloody stool. Patient has a history of stroke and has chronic residual left side weakness.  He is writes on Hydrograph[employer].  Able to transfer to and from wheelchair with no difficulties  Review of Systems  Constitutional: Positive for fatigue. Negative for appetite change, chills, fever and unexpected weight change.  HENT:   Negative for hearing loss and voice change.   Eyes: Negative for eye problems and icterus.  Respiratory: Negative for chest tightness, cough and shortness of breath.   Cardiovascular: Negative for chest pain and leg swelling.  Gastrointestinal: Negative for abdominal distention and abdominal pain.  Endocrine: Negative for hot flashes.  Genitourinary: Negative for difficulty urinating,  dysuria and frequency.   Musculoskeletal: Negative for arthralgias.  Skin: Negative for itching and rash.  Neurological: Negative for light-headedness and numbness.       Chronic left side weakness  Hematological: Negative for adenopathy. Does not bruise/bleed easily.  Psychiatric/Behavioral: Negative for confusion.     MEDICAL HISTORY:  Past Medical History:  Diagnosis Date  . Atrial flutter ([place])    a. s/p TEE/DCCV 04/2017; b. 06/2017 s/p RFCA; c. CHADS2VASc => 5 (CHF, HTN, age x 1, CVA)-->noncompliant with Xarelto.  . Chronic combined systolic and diastolic CHF (congestive heart failure) ([place])    a. TTE 2/19: EF 20-25%, diffuse HK; b. TTE 3/19: EF 20-25%, diffuse HK; c. 08/2017 Echo: EF 35-40%, diff HK. Gr1 DD; d. 06/2018 Echo: EF 40-45%, DD. Neg bubble study.  . CKD (chronic kidney disease), stage II-III   . Essential hypertension   . GI bleed    a.  Hemorrhagic shock in 11/19 secondary to erosive gastropathy and Barrett's esophagus requiring multiple units PRBCs; b. Cleared to resume OAC->pt did not.  Marland Kitchen NICM (nonischemic cardiomyopathy) ([place])    a. 04/2017: Echo EF 20-25%; b. TTE 3/19: EF 20-25%; c. 07/2017 Cath: min irregs; d. 08/2017 Echo: EF 35-40%, diff HK; e. 06/2018 Echo: EF 40-45%, DD.  Marland Kitchen Noncompliance with medications   . PAF (paroxysmal atrial fibrillation) ([place])    a. 07/2017 afib->broke w/ IV amio->converted to oral bb due to prior intol to oral amio.  . Pulmonary hypertension ([place])   . Stroke [facility])    a. 06/2018 MRI/A Brain: R paramedian pons late acute/early subacute infarct, 24m. No assoc hemorrhage or mass effect.  Sev chronic microvascular isch changtes and mod voluem loss. No large vessel occlusion,  aneurysm, or significant stenosis.    SURGICAL HISTORY: Past Surgical History:  Procedure Laterality Date  . A-FLUTTER ABLATION N/A 06/16/2017   Procedure: A-FLUTTER ABLATION;  Surgeon: [name], MD;  Location: [facility];  Service: Cardiovascular;  Laterality:  N/A;  . CARDIAC CATHETERIZATION    . CARDIOVERSION N/A 05/01/2017   Procedure: CARDIOVERSION;  Surgeon: [name], MD;  Location: [facility] ORS;  Service: Cardiovascular;  Laterality: N/A;  . COLONOSCOPY WITH PROPOFOL N/A 04/07/2020   Procedure: COLONOSCOPY WITH PROPOFOL;  Surgeon: [name], MD;  Location: [facility] ENDOSCOPY;  Service: Endoscopy;  Laterality: N/A;  . CORONARY ANGIOPLASTY    . ESOPHAGOGASTRODUODENOSCOPY N/A 04/07/2020   Procedure: ESOPHAGOGASTRODUODENOSCOPY (EGD);  Surgeon: [name], MD;  Location: [facility] ENDOSCOPY;  Service: Endoscopy;  Laterality: N/A;  . ESOPHAGOGASTRODUODENOSCOPY (EGD) WITH PROPOFOL N/A 02/09/2018   Procedure: ESOPHAGOGASTRODUODENOSCOPY (EGD) WITH PROPOFOL;  Surgeon: [name], MD;  Location: [facility] ENDOSCOPY;  Service: Endoscopy;  Laterality: N/A;  . RIGHT/LEFT HEART CATH AND CORONARY ANGIOGRAPHY N/A 07/18/2017   Procedure: RIGHT/LEFT HEART CATH AND CORONARY ANGIOGRAPHY;  Surgeon: [name], MD;  Location: [facility];  Service: Cardiovascular;  Laterality: N/A;  . TEE WITHOUT CARDIOVERSION N/A 05/01/2017   Procedure: TRANSESOPHAGEAL ECHOCARDIOGRAM (TEE);  Surgeon: [name], MD;  Location: [facility] ORS;  Service: Cardiovascular;  Laterality: N/A;  . TEE WITHOUT CARDIOVERSION N/A 06/16/2017   Procedure: TRANSESOPHAGEAL ECHOCARDIOGRAM (TEE);  Surgeon: [name], MD;  Location: [facility] ENDOSCOPY;  Service: Cardiovascular;  Laterality: N/A;    SOCIAL HISTORY: Social History   Socioeconomic History  . Marital status: Single    Spouse name: Not on file  . Number of children: 1  . Years of education: Not on file  . Highest education level: Not on file  Occupational History  . Occupation: [employer]    Comment: [employer]  Tobacco Use  . Smoking status: Former Smoker    Packs/day: 1.00    Years: 3.00    Pack years: 3.00    Types: Cigarettes    Quit date: 04/21/2017    Years since quitting: 3.2  . Smokeless  tobacco: Never Used  Vaping Use  . Vaping Use: Never used  Substance and Sexual Activity  . Alcohol use: Yes    Alcohol/week: 3.0 standard drinks    Types: 3 Cans of beer per week    Comment: Drink a half of a 40oz beer every day, drank heavily in the past  . Drug use: Never  . Sexual activity: Yes    Partners: Female  Other Topics Concern  . Not on file  Social History Narrative  . Not on file   Social Determinants of Health   Financial Resource Strain: Not on file  Food Insecurity: Not on file  Transportation Needs: Not on file  Physical Activity: Not on file  Stress: Not on file  Social Connections: Not on file  Intimate Partner Violence: Not on file    FAMILY HISTORY: Family History  Problem Relation Age of Onset  . Alzheimer's disease Mother   . Alzheimer's disease Father     ALLERGIES:  has No Known Allergies.  MEDICATIONS:  Current Outpatient Medications  Medication Sig Dispense Refill  . acetaminophen (TYLENOL) 325 MG tablet Take 2 tablets (650 mg total) by mouth every 6 (six) hours as needed for fever, headache or moderate pain.    Marland Kitchen apixaban (ELIQUIS) 5 MG TABS tablet Take 5 mg by mouth 2 (two)  times daily.    Marland Kitchen atorvastatin (LIPITOR) 80 MG tablet TAKE 1 TABLET BY MOUTH ONCE DAILY (Patient taking differently: Take 80 mg by mouth daily. TAKE 1 TABLET BY MOUTH ONCE DAILY) 90 tablet 3  . furosemide (LASIX) 40 MG tablet Take 1 tablet (40 mg total) by mouth daily. 30 tablet 0  . pantoprazole (PROTONIX) 40 MG tablet Take 40 mg by mouth daily.    . sacubitril-valsartan (ENTRESTO) 24-26 MG Take 1 tablet by mouth 2 (two) times daily. 60 tablet 3  . spironolactone (ALDACTONE) 25 MG tablet Take 1 tablet (25 mg total) by mouth daily. 90 tablet 3  . iron polysaccharides (NIFEREX) 150 MG capsule Take 1 capsule (150 mg total) by mouth daily. (Patient not taking: No sig reported) 90 capsule 0  . metoprolol succinate (TOPROL-XL) 50 MG 24 hr tablet TAKE 1 TABLET BY MOUTH ONCE  DAILY. TAKE WITH OR IMMEDIATELY FOLLOWING A MEAL. (Patient not taking: Reported on 08/07/2020) 30 tablet 0  . vitamin B-12 (CYANOCOBALAMIN) 500 MCG tablet Take 1 tablet (500 mcg total) by mouth daily. (Patient not taking: No sig reported) 90 tablet 0  . vitamin C (VITAMIN C) 500 MG tablet Take 0.5 tablets (250 mg total) by mouth daily. (Patient not taking: Reported on 08/07/2020) 90 tablet 0   No current facility-administered medications for this visit.     PHYSICAL EXAMINATION: ECOG PERFORMANCE STATUS: 1 - Symptomatic but completely ambulatory Vitals:   08/07/20 1048  BP: (!) 147/90  Pulse: 75  Resp: 18  Temp: 97.8 F (36.6 C)   Filed Weights   08/07/20 1048  Weight: 234 lb (106.1 kg)    Physical Exam Constitutional:      General: He is not in acute distress. HENT:     Head: Normocephalic and atraumatic.  Eyes:     General: No scleral icterus. Cardiovascular:     Rate and Rhythm: Normal rate and regular rhythm.     Heart sounds: Normal heart sounds.  Pulmonary:     Effort: Pulmonary effort is normal. No respiratory distress.     Breath sounds: No wheezing.  Abdominal:     General: Bowel sounds are normal. There is no distension.     Palpations: Abdomen is soft.  Musculoskeletal:        General: No deformity. Normal range of motion.     Cervical back: Normal range of motion and neck supple.  Skin:    General: Skin is warm and dry.     Findings: No erythema or rash.  Neurological:     Mental Status: He is alert and oriented to person, place, and time.     Cranial Nerves: No cranial nerve deficit.     Comments: Chronic left side weakness.  Psychiatric:        Mood and Affect: Mood normal.      LABORATORY DATA:  I have reviewed the data as listed Lab Results  Component Value Date   WBC 4.6 08/07/2020   HGB 13.3 08/07/2020   HCT 40.0 08/07/2020   MCV 84.2 08/07/2020   PLT 258 08/07/2020   Recent Labs    04/04/20 1213 04/05/20 0621 04/07/20 0458  04/08/20 0503 08/07/20 1118  NA 138   < > 135 138 137  K 3.4*   < > 4.4 4.2 4.1  CL 100   < > 101 104 102  CO2 23   < > '25 26 23  '$ GLUCOSE 106*   < > 92 100* 97  BUN  32*   < > '16 13 19  '$ CREATININE 1.43*   < > 1.18 1.17 1.50*  CALCIUM 9.2   < > 9.2 8.9 9.4  GFRNONAA 53*   < > >60 >60 50*  PROT 8.4*  --   --   --  8.5*  ALBUMIN 2.9*  --   --   --  4.0  AST 30  --   --   --  16  ALT 16  --   --   --  12  ALKPHOS 68  --   --   --  63  BILITOT 0.7  --   --   --  0.6   < > = values in this interval not displayed.   Iron/TIBC/Ferritin/ %Sat    Component Value Date/Time   IRON 54 08/07/2020 1118   TIBC 340 08/07/2020 1118   FERRITIN 27 08/07/2020 1118   IRONPCTSAT 16 (L) 08/07/2020 1118        ASSESSMENT & PLAN:  1. Anemia, unspecified type    Patient was not sure why he was referred to hematology.  I discussed with him about recent blood work. Anemia: multifactorial with possible causes including chronic blood loss, hyper/hypothyroidism, nutritional deficiency, infection/chronic inflammation, hemolysis, underlying bone marrow disorders. Will check CBC w differential, CMP, vitamin B12, Folate, iron/TIBC, ferritin, reticulocytes, blood smear, TSH,  LDH,  monoclonal gammopathy evaluation.    Follow-up in 3 weeks to discuss results. Orders Placed This Encounter  Procedures  . CBC with Differential/Platelet    Standing Status:   Future    Number of Occurrences:   1    Standing Expiration Date:   08/07/2021  . Iron and TIBC    Standing Status:   Future    Number of Occurrences:   1    Standing Expiration Date:   08/07/2021  . Ferritin    Standing Status:   Future    Number of Occurrences:   1    Standing Expiration Date:   02/07/2021  . Retic Panel    Standing Status:   Future    Number of Occurrences:   1    Standing Expiration Date:   08/07/2021  . Protein electrophoresis, serum    Standing Status:   Future    Number of Occurrences:   1    Standing Expiration Date:    08/07/2021  . Technologist smear review    Standing Status:   Future    Number of Occurrences:   1    Standing Expiration Date:   08/07/2021  . TSH    Standing Status:   Future    Number of Occurrences:   1    Standing Expiration Date:   08/07/2021  . Comprehensive metabolic panel    Standing Status:   Future    Number of Occurrences:   1    Standing Expiration Date:   08/07/2021  . Vitamin B12    Standing Status:   Future    Number of Occurrences:   1    Standing Expiration Date:   08/07/2021  . Folate    Standing Status:   Future    Number of Occurrences:   1    Standing Expiration Date:   08/07/2021    All questions were answered. The patient knows to call the clinic with any problems questions or concerns. Cc. Alisa Graff, FNP  Return of visit: 3 weeks Thank you for this kind referral and the opportunity to participate  in the care of this patient. A copy of today's note is routed to referring provider    Earlie Server, MD, PhD 08/07/2020

## 2020-08-07 NOTE — Progress Notes (Signed)
Pt here to establish care.

## 2020-08-09 LAB — PROTEIN ELECTROPHORESIS, SERUM
A/G Ratio: 1 (ref 0.7–1.7)
Albumin ELP: 3.9 g/dL (ref 2.9–4.4)
Alpha-1-Globulin: 0.2 g/dL (ref 0.0–0.4)
Alpha-2-Globulin: 0.8 g/dL (ref 0.4–1.0)
Beta Globulin: 1.1 g/dL (ref 0.7–1.3)
Gamma Globulin: 2 g/dL — ABNORMAL HIGH (ref 0.4–1.8)
Globulin, Total: 4.1 g/dL — ABNORMAL HIGH (ref 2.2–3.9)
Total Protein ELP: 8 g/dL (ref 6.0–8.5)

## 2020-08-11 DIAGNOSIS — I5022 Chronic systolic (congestive) heart failure: Secondary | ICD-10-CM | POA: Diagnosis not present

## 2020-08-11 DIAGNOSIS — I69959 Hemiplegia and hemiparesis following unspecified cerebrovascular disease affecting unspecified side: Secondary | ICD-10-CM | POA: Diagnosis not present

## 2020-08-11 DIAGNOSIS — I639 Cerebral infarction, unspecified: Secondary | ICD-10-CM | POA: Diagnosis not present

## 2020-08-11 DIAGNOSIS — D509 Iron deficiency anemia, unspecified: Secondary | ICD-10-CM | POA: Diagnosis not present

## 2020-08-11 DIAGNOSIS — S335XXA Sprain of ligaments of lumbar spine, initial encounter: Secondary | ICD-10-CM | POA: Diagnosis not present

## 2020-08-11 DIAGNOSIS — N184 Chronic kidney disease, stage 4 (severe): Secondary | ICD-10-CM | POA: Diagnosis not present

## 2020-08-28 ENCOUNTER — Inpatient Hospital Stay: Payer: Medicare Other | Attending: Oncology | Admitting: Oncology

## 2020-08-29 ENCOUNTER — Ambulatory Visit (HOSPITAL_BASED_OUTPATIENT_CLINIC_OR_DEPARTMENT_OTHER): Payer: Medicare Other | Admitting: Family

## 2020-08-29 ENCOUNTER — Encounter: Payer: Self-pay | Admitting: Family

## 2020-08-29 ENCOUNTER — Encounter: Payer: Self-pay | Admitting: Pharmacist

## 2020-08-29 ENCOUNTER — Other Ambulatory Visit: Payer: Self-pay

## 2020-08-29 ENCOUNTER — Ambulatory Visit
Admission: RE | Admit: 2020-08-29 | Discharge: 2020-08-29 | Disposition: A | Discharge status: Home / Self Care | Payer: Medicare Other | Source: Ambulatory Visit | Attending: Family | Admitting: Family

## 2020-08-29 VITALS — BP 180/90 | HR 66 | Resp 18 | Ht 77.0 in | Wt 228.1 lb

## 2020-08-29 DIAGNOSIS — Z7901 Long term (current) use of anticoagulants: Secondary | ICD-10-CM | POA: Insufficient documentation

## 2020-08-29 DIAGNOSIS — D649 Anemia, unspecified: Secondary | ICD-10-CM | POA: Insufficient documentation

## 2020-08-29 DIAGNOSIS — I272 Pulmonary hypertension, unspecified: Secondary | ICD-10-CM | POA: Insufficient documentation

## 2020-08-29 DIAGNOSIS — I130 Hypertensive heart and chronic kidney disease with heart failure and stage 1 through stage 4 chronic kidney disease, or unspecified chronic kidney disease: Secondary | ICD-10-CM | POA: Insufficient documentation

## 2020-08-29 DIAGNOSIS — Z8673 Personal history of transient ischemic attack (TIA), and cerebral infarction without residual deficits: Secondary | ICD-10-CM | POA: Insufficient documentation

## 2020-08-29 DIAGNOSIS — I5022 Chronic systolic (congestive) heart failure: Secondary | ICD-10-CM | POA: Insufficient documentation

## 2020-08-29 DIAGNOSIS — Z79899 Other long term (current) drug therapy: Secondary | ICD-10-CM | POA: Insufficient documentation

## 2020-08-29 DIAGNOSIS — I4891 Unspecified atrial fibrillation: Secondary | ICD-10-CM | POA: Insufficient documentation

## 2020-08-29 DIAGNOSIS — N189 Chronic kidney disease, unspecified: Secondary | ICD-10-CM | POA: Insufficient documentation

## 2020-08-29 DIAGNOSIS — I480 Paroxysmal atrial fibrillation: Secondary | ICD-10-CM

## 2020-08-29 DIAGNOSIS — I10 Essential (primary) hypertension: Secondary | ICD-10-CM

## 2020-08-29 DIAGNOSIS — I4892 Unspecified atrial flutter: Secondary | ICD-10-CM | POA: Diagnosis not present

## 2020-08-29 DIAGNOSIS — Z8719 Personal history of other diseases of the digestive system: Secondary | ICD-10-CM | POA: Insufficient documentation

## 2020-08-29 LAB — ECHOCARDIOGRAM COMPLETE: S' Lateral: 3.2 cm

## 2020-08-29 NOTE — Progress Notes (Signed)
*  PRELIMINARY RESULTS* Echocardiogram 2D Echocardiogram has been performed.  Sherrie Sport 08/29/2020, 10:47 AM

## 2020-08-29 NOTE — Progress Notes (Signed)
[place] - PHARMACIST COUNSELING NOTE  Guideline-Directed Medical Therapy/Evidence Based Medicine  ACE/ARB/ARNI: Sacubitril-valsartan 24-26 mg twice daily - pt has been taking once daily  Beta Blocker: Metoprolol succinate 50 mg daily Aldosterone Antagonist: Spironolactone 25 mg daily  - pt has not been taking Diuretic: Furosemide 40 mg daily SGLT2i:  N/A  Adherence Assessment  Do you ever forget to take your medication? '[]'$ Yes '[x]'$ No  Do you ever skip doses due to side effects? '[]'$ Yes '[x]'$ No  Do you have trouble affording your medicines? '[]'$ Yes '[x]'$ No  Are you ever unable to pick up your medication due to transportation difficulties? '[]'$ Yes '[x]'$ No  Do you ever stop taking your medications because you don't believe they are helping? '[]'$ Yes '[x]'$ No  Do you check your weight daily? '[x]'$ Yes '[]'$ No   Adherence strategy: Pt endorses adherence through his daily routine  Barriers to obtaining medications: N/A  Vital signs: HR 66, BP 180/90, weight (pounds) 228.2 ECHO: Date 07/01/2018, EF 40-45%, notes pt had echo earlier today  BMP Latest Ref Rng & Units 08/07/2020 04/08/2020 04/07/2020  Glucose 70 - 99 mg/dL 97 100(H) 92  BUN 8 - 23 mg/dL '19 13 16  '$ Creatinine 0.61 - 1.24 mg/dL 1.50(H) 1.17 1.18  BUN/Creat Ratio 10 - 24 - - -  Sodium 135 - 145 mmol/L 137 138 135  Potassium 3.5 - 5.1 mmol/L 4.1 4.2 4.4  Chloride 98 - 111 mmol/L 102 104 101  CO2 22 - 32 mmol/L '23 26 25  '$ Calcium 8.9 - 10.3 mg/dL 9.4 8.9 9.2    Past Medical History:  Diagnosis Date   Atrial flutter ([place])    a. s/p TEE/DCCV 04/2017; b. 06/2017 s/p RFCA; c. CHADS2VASc => 5 (CHF, HTN, age x 1, CVA)-->noncompliant with Xarelto.   Chronic combined systolic and diastolic CHF (congestive heart failure) ([place])    a. TTE 2/19: EF 20-25%, diffuse HK; b. TTE 3/19: EF 20-25%, diffuse HK; c. 08/2017 Echo: EF 35-40%, diff HK. Gr1 DD; d. 06/2018 Echo: EF 40-45%, DD. Neg bubble study.   CKD (chronic  kidney disease), stage II-III    Essential hypertension    GI bleed    a.  Hemorrhagic shock in 11/19 secondary to erosive gastropathy and Barrett's esophagus requiring multiple units PRBCs; b. Cleared to resume OAC->pt did not.   NICM (nonischemic cardiomyopathy) ([place])    a. 04/2017: Echo EF 20-25%; b. TTE 3/19: EF 20-25%; c. 07/2017 Cath: min irregs; d. 08/2017 Echo: EF 35-40%, diff HK; e. 06/2018 Echo: EF 40-45%, DD.   Noncompliance with medications    PAF (paroxysmal atrial fibrillation) ([place])    a. 07/2017 afib->broke w/ IV amio->converted to oral bb due to prior intol to oral amio.   Pulmonary hypertension ([place])    Stroke ([place])    a. 06/2018 MRI/A Brain: R paramedian pons late acute/early subacute infarct, 66m. No assoc hemorrhage or mass effect.  Sev chronic microvascular isch changtes and mod voluem loss. No large vessel occlusion, aneurysm, or significant stenosis.    ASSESSMENT 70year old male who presents to the HF clinic for a follow up visit. Pt came from his echo appointment. He denies any symptoms/episodes of hypotension. Pt has been taking his Entresto once daily instead of twice daily; I educated the pt that this should be taken twice daily. Pt states he has not been taking spironolactone and states he thought someone took him off of it but is unsure who or why. Pt does not otherwise have any  complaints at this time.   Recent ED Visit (past 6 months): Date - 04/04/2020, [facility] - anemia  PLAN CHF/HTN -continue Entresto 24-26 mg twice daily (educated pt this is twice daily), furosemide 40 mg daily, metoprolol succinate 50 mg daily -restart spironolactone 25 mg daily -continue daily weight checks  Aflutter/afib -continue apixaban 5 mg twice daily and metoprolol as above  Hx of stroke -continue atorvastatin 80 mg daily -consider addition of aspirin 81 mg daily   Time spent: 10 minutes  Sherilyn Banker, PharmD Pharmacy Resident  08/29/2020 11:41 AM     Current Outpatient  Medications:    acetaminophen (TYLENOL) 325 MG tablet, Take 2 tablets (650 mg total) by mouth every 6 (six) hours as needed for fever, headache or moderate pain., Disp: , Rfl:    apixaban (ELIQUIS) 5 MG TABS tablet, Take 5 mg by mouth 2 (two) times daily., Disp: , Rfl:    atorvastatin (LIPITOR) 80 MG tablet, TAKE 1 TABLET BY MOUTH ONCE DAILY, Disp: 90 tablet, Rfl: 3   furosemide (LASIX) 40 MG tablet, Take 1 tablet (40 mg total) by mouth daily., Disp: 30 tablet, Rfl: 0   iron polysaccharides (NIFEREX) 150 MG capsule, Take 1 capsule (150 mg total) by mouth daily. (Patient not taking: No sig reported), Disp: 90 capsule, Rfl: 0   metoprolol succinate (TOPROL-XL) 50 MG 24 hr tablet, TAKE 1 TABLET BY MOUTH ONCE DAILY. TAKE WITH OR IMMEDIATELY FOLLOWING A MEAL., Disp: 30 tablet, Rfl: 0   pantoprazole (PROTONIX) 40 MG tablet, Take 40 mg by mouth daily., Disp: , Rfl:    sacubitril-valsartan (ENTRESTO) 24-26 MG, Take 1 tablet by mouth 2 (two) times daily., Disp: 60 tablet, Rfl: 3   spironolactone (ALDACTONE) 25 MG tablet, Take 1 tablet (25 mg total) by mouth daily. (Patient not taking: Reported on 08/29/2020), Disp: 90 tablet, Rfl: 3   vitamin B-12 (CYANOCOBALAMIN) 500 MCG tablet, Take 1 tablet (500 mcg total) by mouth daily. (Patient not taking: No sig reported), Disp: 90 tablet, Rfl: 0   vitamin C (VITAMIN C) 500 MG tablet, Take 0.5 tablets (250 mg total) by mouth daily. (Patient not taking: No sig reported), Disp: 90 tablet, Rfl: 0   DRUGS TO CAUTION IN HEART FAILURE  Drug or Class Mechanism  Analgesics NSAIDs COX-2 inhibitors Glucocorticoids  Sodium and water retention, increased systemic vascular resistance, decreased response to diuretics   Diabetes Medications Metformin Thiazolidinediones Rosiglitazone (Avandia) Pioglitazone (Actos) DPP4 Inhibitors Saxagliptin (Onglyza) Sitagliptin (Januvia)   Lactic acidosis Possible calcium channel blockade   Unknown  Antiarrhythmics Class I   Flecainide Disopyramide Class III Sotalol Other Dronedarone  Negative inotrope, proarrhythmic   Proarrhythmic, beta blockade  Negative inotrope  Antihypertensives Alpha Blockers Doxazosin Calcium Channel Blockers Diltiazem Verapamil Nifedipine Central Alpha Adrenergics Moxonidine Peripheral Vasodilators Minoxidil  Increases renin and aldosterone  Negative inotrope    Possible sympathetic withdrawal  Unknown  Anti-infective Itraconazole Amphotericin B  Negative inotrope Unknown  Hematologic Anagrelide Cilostazol   Possible inhibition of PD IV Inhibition of PD III causing arrhythmias  Neurologic/Psychiatric Stimulants Anti-Seizure Drugs Carbamazepine Pregabalin Antidepressants Tricyclics Citalopram Parkinsons Bromocriptine Pergolide Pramipexole Antipsychotics Clozapine Antimigraine Ergotamine Methysergide Appetite suppressants Bipolar Lithium  Peripheral alpha and beta agonist activity  Negative inotrope and chronotrope Calcium channel blockade  Negative inotrope, proarrhythmic Dose-dependent QT prolongation  Excessive serotonin activity/valvular damage Excessive serotonin activity/valvular damage Unknown  IgE mediated hypersensitivy, calcium channel blockade  Excessive serotonin activity/valvular damage Excessive serotonin activity/valvular damage Valvular damage  Direct myofibrillar degeneration, adrenergic stimulation  Antimalarials  Chloroquine Hydroxychloroquine Intracellular inhibition of lysosomal enzymes  Urologic Agents Alpha Blockers Doxazosin Prazosin Tamsulosin Terazosin  Increased renin and aldosterone  Adapted from Page Carleene Overlie, et al. "Drugs That May Cause or Exacerbate Heart Failure: A Scientific Statement from the American Heart  Association." Circulation 2016; O8193432. DOI: 10.1161/CIR.0000000000000426   MEDICATION ADHERENCES TIPS AND STRATEGIES Taking medication as prescribed improves patient outcomes in  heart failure (reduces hospitalizations, improves symptoms, increases survival) Side effects of medications can be managed by decreasing doses, switching agents, stopping drugs, or adding additional therapy. Please let someone in the [facility] know if you have having bothersome side effects so we can modify your regimen. Do not alter your medication regimen without talking to [place].  Medication reminders can help patients remember to take drugs on time. If you are missing or forgetting doses you can try linking behaviors, using pill boxes, or an electronic reminder like an alarm on your phone or an app. Some people can also get automated phone calls as medication reminders.

## 2020-08-29 NOTE — Patient Instructions (Addendum)
Continue weighing daily and call for an overnight weight gain of > 2 pounds or a weekly weight gain of >5 pounds.    Take entresto 1 tablet in the morning and 1 tablet in the evening

## 2020-08-29 NOTE — Progress Notes (Signed)
Patient ID: Nathan Nieblas., male    DOB: 1951-02-01, 70 y.o.   MRN: [id number]  HPI  Nathan [name] is a 70 y/o male with a history of HTN, stroke, previous tobacco use and chronic heart failure.   Echo report from 07/01/2018 reviewed and showed an EF of 40-45%. Echo report from 09/04/17 reviewed and showed an EF of 35-40%. Echo report from 06/16/17 reviewed and showed an EF of 15-20% along with mod-severe TR and an elevated PA pressure of 45 mm Hg.  Cardiac catheterization done 07/18/17 and showed heavily calcified coronary arteries with mild nonobstructive disease.  Left dominant system. Right heart catheterization showed moderately to severely elevated filling pressures, severe pulmonary hypertension and severely reduced cardiac output.   Admitted 04/04/20 due to worsening weakness found to be anemic with hemoglobin of 7.4. GI consult obtained. Head CT negative. Given 1 unit of PRBC's along with venofer IV. EGD and colonoscopy done with removal of a polyp. Diuretic held due to hypotension. PT consult obtained. Discharged after 4 days.    He presents today for a follow-up visit with a chief complaint of minimal fatigue upon moderate exertion. He describes this as chronic in nature having been present for several years. He has associated cough along with this. He denies any difficulty sleeping, abdominal distention, palpitations, pedal edema, chest pain, shortness of breath, dizziness or weight gain.   Has his echo done earlier today. Says that he has upcoming lab work at his PCP's office but he can't remember the date. Has only been taking his entresto as 1 tablet every morning instead of BID  Past Medical History:  Diagnosis Date   Atrial flutter ([place])    a. s/p TEE/DCCV 04/2017; b. 06/2017 s/p RFCA; c. CHADS2VASc => 5 (CHF, HTN, age x 1, CVA)-->noncompliant with Xarelto.   Chronic combined systolic and diastolic CHF (congestive heart failure) ([place])    a. TTE 2/19: EF 20-25%, diffuse HK; b. TTE 3/19: EF  20-25%, diffuse HK; c. 08/2017 Echo: EF 35-40%, diff HK. Gr1 DD; d. 06/2018 Echo: EF 40-45%, DD. Neg bubble study.   CKD (chronic kidney disease), stage II-III    Essential hypertension    GI bleed    a.  Hemorrhagic shock in 11/19 secondary to erosive gastropathy and Barrett's esophagus requiring multiple units PRBCs; b. Cleared to resume OAC->pt did not.   NICM (nonischemic cardiomyopathy) ([place])    a. 04/2017: Echo EF 20-25%; b. TTE 3/19: EF 20-25%; c. 07/2017 Cath: min irregs; d. 08/2017 Echo: EF 35-40%, diff HK; e. 06/2018 Echo: EF 40-45%, DD.   Noncompliance with medications    PAF (paroxysmal atrial fibrillation) ([place])    a. 07/2017 afib->broke w/ IV amio->converted to oral bb due to prior intol to oral amio.   Pulmonary hypertension ([place])    Stroke ([place])    a. 06/2018 MRI/A Brain: R paramedian pons late acute/early subacute infarct, 47m. No assoc hemorrhage or mass effect.  Sev chronic microvascular isch changtes and mod voluem loss. No large vessel occlusion, aneurysm, or significant stenosis.   Past Surgical History:  Procedure Laterality Date   A-FLUTTER ABLATION N/A 06/16/2017   Procedure: A-FLUTTER ABLATION;  Surgeon: TEvans Lance MD;  [facility];  Service: Cardiovascular;  Laterality: N/A;   CARDIAC CATHETERIZATION     CARDIOVERSION N/A 05/01/2017   Procedure: CARDIOVERSION;  Surgeon: GMinna Merritts MD;  Location: [facility] ORS;  Service: Cardiovascular;  Laterality: N/A;   COLONOSCOPY WITH PROPOFOL N/A 04/07/2020  Procedure: COLONOSCOPY WITH PROPOFOL;  Surgeon: [name], MD;  Location: [facility] ENDOSCOPY;  Service: Endoscopy;  Laterality: N/A;   CORONARY ANGIOPLASTY     ESOPHAGOGASTRODUODENOSCOPY N/A 04/07/2020   Procedure: ESOPHAGOGASTRODUODENOSCOPY (EGD);  Surgeon: [name], MD;  Location: [facility] ENDOSCOPY;  Service: Endoscopy;  Laterality: N/A;   ESOPHAGOGASTRODUODENOSCOPY (EGD) WITH PROPOFOL N/A 02/09/2018   Procedure: ESOPHAGOGASTRODUODENOSCOPY  (EGD) WITH PROPOFOL;  Surgeon: [name], MD;  Location: [facility] ENDOSCOPY;  Service: Endoscopy;  Laterality: N/A;   RIGHT/LEFT HEART CATH AND CORONARY ANGIOGRAPHY N/A 07/18/2017   Procedure: RIGHT/LEFT HEART CATH AND CORONARY ANGIOGRAPHY;  Surgeon: [name], MD;  Location: [place] CV LAB;  Service: Cardiovascular;  Laterality: N/A;   TEE WITHOUT CARDIOVERSION N/A 05/01/2017   Procedure: TRANSESOPHAGEAL ECHOCARDIOGRAM (TEE);  Surgeon: [name], MD;  Location: [facility] ORS;  Service: Cardiovascular;  Laterality: N/A;   TEE WITHOUT CARDIOVERSION N/A 06/16/2017   Procedure: TRANSESOPHAGEAL ECHOCARDIOGRAM (TEE);  Surgeon: [name], MD;  Location: [facility] ENDOSCOPY;  Service: Cardiovascular;  Laterality: N/A;   Family History  Problem Relation Age of Onset   Alzheimer's disease Mother    Alzheimer's disease Father    Social History   Tobacco Use   Smoking status: Former    Packs/day: 1.00    Years: 3.00    Pack years: 3.00    Types: Cigarettes    Quit date: 04/21/2017    Years since quitting: 3.3   Smokeless tobacco: Never  Substance Use Topics   Alcohol use: Yes    Alcohol/week: 3.0 standard drinks    Types: 3 Cans of beer per week    Comment: Drink a half of a 40oz beer every day, drank heavily in the past   No Known Allergies  Prior to Admission medications   Medication Sig Start Date End Date Taking? Authorizing Provider  acetaminophen (TYLENOL) 325 MG tablet Take 2 tablets (650 mg total) by mouth every 6 (six) hours as needed for fever, headache or moderate pain. 04/08/20  Yes Val Riles, MD  apixaban (ELIQUIS) 5 MG TABS tablet Take 5 mg by mouth 2 (two) times daily. 04/12/20  Yes Val Riles, MD  atorvastatin (LIPITOR) 80 MG tablet TAKE 1 TABLET BY MOUTH ONCE DAILY 09/10/19  Yes Theora Gianotti, NP  furosemide (LASIX) 40 MG tablet Take 1 tablet (40 mg total) by mouth daily. 04/08/20  Yes Val Riles, MD  metoprolol succinate (TOPROL-XL) 50  MG 24 hr tablet TAKE 1 TABLET BY MOUTH ONCE DAILY. TAKE WITH OR IMMEDIATELY FOLLOWING A MEAL. 06/29/19  Yes Gollan, Kathlene November, MD  pantoprazole (PROTONIX) 40 MG tablet Take 40 mg by mouth daily.   Yes [provider]  sacubitril-valsartan (ENTRESTO) 24-26 MG Take 1 tablet by mouth 1 (one) times daily. 07/31/20  Yes Darylene Price A, FNP  iron polysaccharides (NIFEREX) 150 MG capsule Take 1 capsule (150 mg total) by mouth daily. Patient not taking: No sig reported 04/08/20   Val Riles, MD  spironolactone (ALDACTONE) 25 MG tablet Take 1 tablet (25 mg total) by mouth daily. Patient not taking: Reported on 08/29/2020 12/08/19   [name], MD  vitamin B-12 (CYANOCOBALAMIN) 500 MCG tablet Take 1 tablet (500 mcg total) by mouth daily. Patient not taking: No sig reported 04/08/20   Val Riles, MD  vitamin C (VITAMIN C) 500 MG tablet Take 0.5 tablets (250 mg total) by mouth daily. Patient not taking: No sig reported 04/08/20   Val Riles, MD    Review  of Systems  Constitutional:  Positive for fatigue (minimal). Negative for appetite change.  HENT:  Negative for congestion, postnasal drip and sore throat.   Eyes: Negative.   Respiratory:  Positive for cough. Negative for chest tightness and shortness of breath.   Cardiovascular:  Negative for chest pain, palpitations and leg swelling.  Gastrointestinal:  Negative for abdominal distention and abdominal pain.  Endocrine: Negative.   Genitourinary: Negative.   Musculoskeletal:  Negative for arthralgias and back pain.  Skin: Negative.   Allergic/Immunologic: Negative.   Neurological:  Negative for dizziness and light-headedness.  Hematological:  Negative for adenopathy. Does not bruise/bleed easily.  Psychiatric/Behavioral:  Negative for dysphoric mood and sleep disturbance. The patient is not nervous/anxious.    Vitals:   08/29/20 1055  BP: (!) 180/90  Pulse: 66  Resp: 18  SpO2: 100%  Weight: 228 lb 2 oz (103.5 kg)  Height:  '6\' 5"'$  (1.956 m)   Wt Readings from Last 3 Encounters:  08/29/20 228 lb 2 oz (103.5 kg)  08/07/20 234 lb (106.1 kg)  07/31/20 229 lb 4 oz (104 kg)   Lab Results  Component Value Date   CREATININE 1.50 (H) 08/07/2020   CREATININE 1.17 04/08/2020   CREATININE 1.18 04/07/2020    Physical Exam Vitals reviewed.  Constitutional:      Appearance: Normal appearance.  HENT:     Head: Normocephalic and atraumatic.  Cardiovascular:     Rate and Rhythm: Normal rate and regular rhythm.  Pulmonary:     Effort: Pulmonary effort is normal. No respiratory distress.     Breath sounds: No wheezing or rales.  Abdominal:     General: Abdomen is flat. There is no distension.     Palpations: Abdomen is soft.  Musculoskeletal:        General: No tenderness.     Cervical back: Normal range of motion and neck supple.     Right lower leg: No edema.     Left lower leg: No edema.  Skin:    General: Skin is warm and dry.  Neurological:     General: No focal deficit present.     Mental Status: He is alert and oriented to person, place, and time.  Psychiatric:        Mood and Affect: Mood normal.        Behavior: Behavior normal.        Thought Content: Thought content normal.   Assessment & Plan:  1: Chronic heart failure with mildly reduced ejection fraction- - NYHA class II - euvolemic today - weighing daily; Reviewed the importance of calling for any overnight weight gain of >2 pounds or a weekly weight gain of >5 pounds - weight down 1 pound from last visit here 1 month ago  - continues to watch his diet closely; getting out of the house more as he now has a scooter so isn't staying home "eating all the time" - not adding salt to his food and tries to eat low sodium foods. Reminded to closely follow a '2000mg'$  sodium diet  - has only been taking his entresto once daily and he was instructed to make sure he takes it BID - is going to call me back with the date of his lab work as I would like to  restart spironolactone but need to make sure he would have upcoming lab work scheduled for ~ 1 week after starting it - saw cardiology Rockey Situ) 01/14/20 - had echo done earlier today - BNP  on 04/04/20 was 138.8 - PharmD reconciled medications with the patient  2: HTN- - BP elevated but not taking entresto correctly and he's not sure he's even taken his medications today because [facility] picked him up very early this morning - will defer BMP until next time as he's only been taking entresto once/ day - saw PCP (Tejan-Sie) 05/05/20 - BMP from 04/08/20 reviewed and showed sodium 138, potassium 4.2, creatinine 1.17 and GFR 42  3: Atrial fibrillation- - saw EP [name]) 12/16/19  4: Anemia- - saw hematology Tasia Catchings) 08/07/20 - says that he's no longer taking iron or vitamin B12 but can't give a reason as to why    Patient did not bring his medications nor a list. Each medication was verbally reviewed with the patient and he was encouraged to bring the bottles to every visit to confirm accuracy of list.  Return weeks or sooner for any questions/problems before then.

## 2020-10-12 ENCOUNTER — Ambulatory Visit: Payer: Medicare Other | Admitting: Family

## 2020-11-13 ENCOUNTER — Ambulatory Visit: Payer: Medicare Other | Admitting: Family

## 2021-01-29 DIAGNOSIS — R2681 Unsteadiness on feet: Secondary | ICD-10-CM | POA: Diagnosis not present

## 2021-01-29 DIAGNOSIS — I509 Heart failure, unspecified: Secondary | ICD-10-CM | POA: Diagnosis not present

## 2021-01-29 DIAGNOSIS — I69898 Other sequelae of other cerebrovascular disease: Secondary | ICD-10-CM | POA: Diagnosis not present

## 2021-01-31 ENCOUNTER — Ambulatory Visit: Payer: Medicare Other | Admitting: Family

## 2021-02-15 ENCOUNTER — Ambulatory Visit: Payer: Medicare Other | Admitting: Family

## 2021-03-19 ENCOUNTER — Other Ambulatory Visit: Payer: Self-pay | Admitting: Cardiovascular Disease

## 2021-03-19 ENCOUNTER — Other Ambulatory Visit: Payer: Self-pay | Admitting: Nurse Practitioner

## 2021-03-20 NOTE — Telephone Encounter (Signed)
Please contact pt for future appointment. Pt is overdue for 12 month f/u.  Pt needing refills.

## 2021-04-10 ENCOUNTER — Other Ambulatory Visit: Payer: Self-pay

## 2021-04-10 ENCOUNTER — Encounter: Payer: Self-pay | Admitting: Pharmacist

## 2021-04-10 ENCOUNTER — Ambulatory Visit: Payer: Medicare Other | Attending: Family | Admitting: Family

## 2021-04-10 ENCOUNTER — Encounter: Payer: Self-pay | Admitting: Family

## 2021-04-10 VITALS — BP 121/73 | HR 84 | Resp 18 | Ht 77.0 in

## 2021-04-10 DIAGNOSIS — N189 Chronic kidney disease, unspecified: Secondary | ICD-10-CM | POA: Diagnosis not present

## 2021-04-10 DIAGNOSIS — I480 Paroxysmal atrial fibrillation: Secondary | ICD-10-CM | POA: Diagnosis not present

## 2021-04-10 DIAGNOSIS — I5022 Chronic systolic (congestive) heart failure: Secondary | ICD-10-CM | POA: Diagnosis not present

## 2021-04-10 DIAGNOSIS — I10 Essential (primary) hypertension: Secondary | ICD-10-CM

## 2021-04-10 DIAGNOSIS — I5042 Chronic combined systolic (congestive) and diastolic (congestive) heart failure: Secondary | ICD-10-CM | POA: Diagnosis not present

## 2021-04-10 DIAGNOSIS — I272 Pulmonary hypertension, unspecified: Secondary | ICD-10-CM | POA: Diagnosis not present

## 2021-04-10 DIAGNOSIS — I130 Hypertensive heart and chronic kidney disease with heart failure and stage 1 through stage 4 chronic kidney disease, or unspecified chronic kidney disease: Secondary | ICD-10-CM | POA: Insufficient documentation

## 2021-04-10 MED ORDER — METOPROLOL SUCCINATE ER 25 MG PO TB24: ORAL_TABLET | ORAL | 3 refills | Status: DC

## 2021-04-10 MED ORDER — FUROSEMIDE 40 MG PO TABS: 40.0000 mg | ORAL_TABLET | Freq: Every day | ORAL | 3 refills | Status: DC

## 2021-04-10 NOTE — Progress Notes (Signed)
Patient ID: Nathan [name]., male    DOB: 04-21-1950, 71 y.o.   MRN: [id number]  HPI  Nathan [name] is a 71 y/o male with a history of HTN, stroke, previous tobacco use and chronic heart failure.   Echo reports from 08/29/20: Left ventricular ejection fraction, by estimation, is 55 %. The left ventricle has normal function. The left ventricle has no regional wall motion abnormalities. The left ventricular internal cavity size was normal in size. There is mild left ventricular hypertrophy. Left ventricular diastolic parameters are indeterminate. Echo report from 07/01/2018 reviewed and showed an EF of 40-45%. Echo report from 09/04/17 reviewed and showed an EF of 35-40%. Echo report from 06/16/17 reviewed and showed an EF of 15-20% along with mod-severe TR and an elevated PA pressure of 45 mm Hg.  Cardiac catheterization done 07/18/17 and showed heavily calcified coronary arteries with mild nonobstructive disease.  Left dominant system. Right heart catheterization showed moderately to severely elevated filling pressures, severe pulmonary hypertension and severely reduced cardiac output.   No recent ED visits or hospital admissions.   He presents today for a follow-up visit with a chief complaint of minimal fatigue upon moderate exertion. He describes this as chronic in nature having been present for several years. He denies any difficulty sleeping, abdominal distention, palpitations, pedal edema, chest pain, shortness of breath, dizziness or weight gain.   He has been weighing daily and his weights have been stable. He recently experienced a stressful housing relocation where he misplaced many of his medications and was not taking them for a while. He recently found his entresto, spironolactone and began to take again.   Past Medical History:  Diagnosis Date   Atrial flutter ([place])    a. s/p TEE/DCCV 04/2017; b. 06/2017 s/p RFCA; c. CHADS2VASc => 5 (CHF, HTN, age x 1, CVA)-->noncompliant with Xarelto.   Chronic  combined systolic and diastolic CHF (congestive heart failure) ([place])    a. TTE 2/19: EF 20-25%, diffuse HK; b. TTE 3/19: EF 20-25%, diffuse HK; c. 08/2017 Echo: EF 35-40%, diff HK. Gr1 DD; d. 06/2018 Echo: EF 40-45%, DD. Neg bubble study.   CKD (chronic kidney disease), stage II-III    Essential hypertension    GI bleed    a.  Hemorrhagic shock in 11/19 secondary to erosive gastropathy and Barrett's esophagus requiring multiple units PRBCs; b. Cleared to resume OAC->pt did not.   NICM (nonischemic cardiomyopathy) ([place])    a. 04/2017: Echo EF 20-25%; b. TTE 3/19: EF 20-25%; c. 07/2017 Cath: min irregs; d. 08/2017 Echo: EF 35-40%, diff HK; e. 06/2018 Echo: EF 40-45%, DD.   Noncompliance with medications    PAF (paroxysmal atrial fibrillation) ([place])    a. 07/2017 afib->broke w/ IV amio->converted to oral bb due to prior intol to oral amio.   Pulmonary hypertension ([place])    Stroke ([place])    a. 06/2018 MRI/A Brain: R paramedian pons late acute/early subacute infarct, 12mm. No assoc hemorrhage or mass effect.  Sev chronic microvascular isch changtes and mod voluem loss. No large vessel occlusion, aneurysm, or significant stenosis.   Past Surgical History:  Procedure Laterality Date   A-FLUTTER ABLATION N/A 06/16/2017   Procedure: A-FLUTTER ABLATION;  Surgeon: [name], MD;  Location: [place] CV LAB;  Service: Cardiovascular;  Laterality: N/A;   CARDIAC CATHETERIZATION     CARDIOVERSION N/A 05/01/2017   Procedure: CARDIOVERSION;  Surgeon: [name], MD;  Location: [facility] ORS;  Service: Cardiovascular;  Laterality: N/A;   COLONOSCOPY  WITH PROPOFOL N/A 04/07/2020   Procedure: COLONOSCOPY WITH PROPOFOL;  Surgeon: [name], MD;  Location: [facility] ENDOSCOPY;  Service: Endoscopy;  Laterality: N/A;   CORONARY ANGIOPLASTY     ESOPHAGOGASTRODUODENOSCOPY N/A 04/07/2020   Procedure: ESOPHAGOGASTRODUODENOSCOPY (EGD);  Surgeon: [name], MD;  Location: [facility] ENDOSCOPY;  Service: Endoscopy;   Laterality: N/A;   ESOPHAGOGASTRODUODENOSCOPY (EGD) WITH PROPOFOL N/A 02/09/2018   Procedure: ESOPHAGOGASTRODUODENOSCOPY (EGD) WITH PROPOFOL;  Surgeon: [name], MD;  Location: [facility] ENDOSCOPY;  Service: Endoscopy;  Laterality: N/A;   RIGHT/LEFT HEART CATH AND CORONARY ANGIOGRAPHY N/A 07/18/2017   Procedure: RIGHT/LEFT HEART CATH AND CORONARY ANGIOGRAPHY;  Surgeon: [name], MD;  Location: [place] CV LAB;  Service: Cardiovascular;  Laterality: N/A;   TEE WITHOUT CARDIOVERSION N/A 05/01/2017   Procedure: TRANSESOPHAGEAL ECHOCARDIOGRAM (TEE);  Surgeon: [name], MD;  Location: [facility] ORS;  Service: Cardiovascular;  Laterality: N/A;   TEE WITHOUT CARDIOVERSION N/A 06/16/2017   Procedure: TRANSESOPHAGEAL ECHOCARDIOGRAM (TEE);  Surgeon: [name], MD;  Location: [facility] ENDOSCOPY;  Service: Cardiovascular;  Laterality: N/A;   Family History  Problem Relation Age of Onset   Alzheimer's disease Mother    Alzheimer's disease Father    Social History   Tobacco Use   Smoking status: Former    Packs/day: 1.00    Years: 3.00    Pack years: 3.00    Types: Cigarettes    Quit date: 04/21/2017    Years since quitting: 3.9   Smokeless tobacco: Never  Substance Use Topics   Alcohol use: Yes    Alcohol/week: 3.0 standard drinks    Types: 3 Cans of beer per week    Comment: Drink a half of a 40oz beer every day, drank heavily in the past   No Known Allergies  Prior to Admission medications   Medication Sig Start Date End Date Taking? Authorizing Provider  acetaminophen (TYLENOL) 325 MG tablet Take 2 tablets (650 mg total) by mouth every 6 (six) hours as needed for fever, headache or moderate pain. 04/08/20  Yes Val Riles, MD  apixaban (ELIQUIS) 5 MG TABS tablet Take 5 mg by mouth 2 (two) times daily. 04/12/20  Yes Val Riles, MD  atorvastatin (LIPITOR) 80 MG tablet TAKE 1 TABLET BY MOUTH ONCE DAILY 09/10/19  Yes Theora Gianotti, NP  furosemide (LASIX)  40 MG tablet Take 1 tablet (40 mg total) by mouth daily. 04/08/20  Yes Val Riles, MD  metoprolol succinate (TOPROL-XL) 50 MG 24 hr tablet TAKE 1 TABLET BY MOUTH ONCE DAILY. TAKE WITH OR IMMEDIATELY FOLLOWING A MEAL. 06/29/19  Yes Gollan, Kathlene November, MD  pantoprazole (PROTONIX) 40 MG tablet Take 40 mg by mouth daily.   Yes [provider]  sacubitril-valsartan (ENTRESTO) 24-26 MG Take 1 tablet by mouth 1 (one) times daily. 07/31/20  Yes Darylene Price A, FNP  iron polysaccharides (NIFEREX) 150 MG capsule Take 1 capsule (150 mg total) by mouth daily. Patient not taking: No sig reported 04/08/20   Val Riles, MD  spironolactone (ALDACTONE) 25 MG tablet Take 1 tablet (25 mg total) by mouth daily. Patient not taking: Reported on 08/29/2020 12/08/19   [name], MD  vitamin B-12 (CYANOCOBALAMIN) 500 MCG tablet Take 1 tablet (500 mcg total) by mouth daily. Patient not taking: No sig reported 04/08/20   Val Riles, MD  vitamin C (VITAMIN C) 500 MG tablet Take 0.5 tablets (250 mg total) by mouth daily. Patient not taking: No sig reported 04/08/20   Dwyane Dee,  Dileep, MD    Review of Systems  Constitutional:  Positive for fatigue (minimal). Negative for appetite change and unexpected weight change.  HENT:  Negative for congestion, postnasal drip and sore throat.   Eyes: Negative.   Respiratory:  Negative for cough, chest tightness and shortness of breath.   Cardiovascular:  Negative for chest pain, palpitations and leg swelling.  Gastrointestinal:  Negative for abdominal distention and abdominal pain.  Endocrine: Negative.   Genitourinary: Negative.   Musculoskeletal:  Negative for arthralgias and back pain.  Skin: Negative.   Allergic/Immunologic: Negative.   Neurological:  Negative for dizziness and light-headedness.  Hematological:  Negative for adenopathy. Does not bruise/bleed easily.  Psychiatric/Behavioral:  Negative for dysphoric mood and sleep disturbance. The patient is not  nervous/anxious.    Vitals:   04/10/21 1029  BP: 121/73  Pulse: 84  Resp: 18  SpO2: 100%  Height: 6\' 5"  (1.956 m)   Wt Readings from Last 3 Encounters:  08/29/20 228 lb 2 oz (103.5 kg)  08/07/20 234 lb (106.1 kg)  07/31/20 229 lb 4 oz (104 kg)   Lab Results  Component Value Date   CREATININE 1.50 (H) 08/07/2020   CREATININE 1.17 04/08/2020   CREATININE 1.18 04/07/2020    Physical Exam Vitals and nursing note reviewed.  Constitutional:      General: He is not in acute distress.    Appearance: Normal appearance. He is not ill-appearing.  HENT:     Head: Normocephalic and atraumatic.  Cardiovascular:     Rate and Rhythm: Normal rate and regular rhythm.  Pulmonary:     Effort: Pulmonary effort is normal. No respiratory distress.     Breath sounds: No wheezing or rales.  Abdominal:     General: Abdomen is flat. There is no distension.     Palpations: Abdomen is soft.  Musculoskeletal:        General: No tenderness.     Cervical back: Normal range of motion and neck supple.     Right lower leg: No edema.     Left lower leg: No edema.  Skin:    General: Skin is warm and dry.  Neurological:     General: No focal deficit present.     Mental Status: He is alert and oriented to person, place, and time.  Psychiatric:        Mood and Affect: Mood normal.        Behavior: Behavior normal.        Thought Content: Thought content normal.   Assessment & Plan:  1: Chronic heart failure with mildly reduced ejection fraction- - NYHA class II - euvolemic today - weighing daily; Reviewed the importance of calling for any overnight weight gain of >2 pounds or a weekly weight gain of >5 pounds - weight down 7 lbs from last visit here in June - continues to watch his diet closely - not adding salt to his food and tries to eat low sodium foods. Reminded to closely follow a 2000mg  sodium diet. - will see cardiology on 04/27/21 - will restart metoprolol 25 mg qd - will consider  SGLT2 at next visit  - BNP on 04/04/20 was 138.8 - PharmD reconciled medications with the patient  2: HTN- - BP 121/73 - will see PCP (Tejan-Sie) 04/17/21 with labs to be drawn the 04/16/21 - BMP from 04/08/20 reviewed and showed sodium 138, potassium 4.2, creatinine 1.17 and GFR 42  3: Atrial fibrillation- - is out of eliquis - RRR  today - will see cardiology on 2/10, encouraged to let office know he needed eliquis   Patient brought the medications he is currently taking.   Return weeks or sooner for any questions/problems before then.

## 2021-04-10 NOTE — Progress Notes (Signed)
[facility] - PHARMACIST COUNSELING NOTE  Guideline-Directed Medical Therapy/Evidence Based Medicine  ACE/ARB/ARNI: Sacubitril-valsartan 24-26 mg twice daily Beta Blocker:  Pt reports he has been out for a while Aldosterone Antagonist: Spironolactone 25 mg daily Diuretic:  Pt reports he has been out for a while SGLT2i:  N/A  Adherence Assessment  Do you ever forget to take your medication? [] Yes [x] No  Do you ever skip doses due to side effects? [] Yes [x] No  Do you have trouble affording your medicines? [] Yes [x] No  Are you ever unable to pick up your medication due to transportation difficulties? [x] Yes [] No  Do you ever stop taking your medications because you don't believe they are helping? [] Yes [x] No  Do you check your weight daily? [] Yes [x] No    Barriers to obtaining medications: Pt had been homeless for a while and was unable to fill his medications for an extended period of time. Pt does not recall an exact amount of time but based on fill history, only spironolactone, Entresto, tizanidine, and atorvastatin have been filled within the past year.   Vital signs: HR 84, BP 121/73, weight (pounds) 223 ECHO: Date 08/29/20, EF 55%, mild LVH, notes improved since 06/17/27 with EF 15-20%  BMP Latest Ref Rng & Units 08/07/2020 04/08/2020 04/07/2020  Glucose 70 - 99 mg/dL 97 100(H) 92  BUN 8 - 23 mg/dL 19 13 16   Creatinine 0.61 - 1.24 mg/dL 1.50(H) 1.17 1.18  BUN/Creat Ratio 10 - 24 - - -  Sodium 135 - 145 mmol/L 137 138 135  Potassium 3.5 - 5.1 mmol/L 4.1 4.2 4.4  Chloride 98 - 111 mmol/L 102 104 101  CO2 22 - 32 mmol/L 23 26 25   Calcium 8.9 - 10.3 mg/dL 9.4 8.9 9.2    Past Medical History:  Diagnosis Date   Atrial flutter ([place])    a. s/p TEE/DCCV 04/2017; b. 06/2017 s/p RFCA; c. CHADS2VASc => 5 (CHF, HTN, age x 1, CVA)-->noncompliant with Xarelto.   Chronic combined systolic and diastolic CHF (congestive heart failure) ([place])    a.  TTE 2/19: EF 20-25%, diffuse HK; b. TTE 3/19: EF 20-25%, diffuse HK; c. 08/2017 Echo: EF 35-40%, diff HK. Gr1 DD; d. 06/2018 Echo: EF 40-45%, DD. Neg bubble study.   CKD (chronic kidney disease), stage II-III    Essential hypertension    GI bleed    a.  Hemorrhagic shock in 11/19 secondary to erosive gastropathy and Barrett's esophagus requiring multiple units PRBCs; b. Cleared to resume OAC->pt did not.   NICM (nonischemic cardiomyopathy) ([place])    a. 04/2017: Echo EF 20-25%; b. TTE 3/19: EF 20-25%; c. 07/2017 Cath: min irregs; d. 08/2017 Echo: EF 35-40%, diff HK; e. 06/2018 Echo: EF 40-45%, DD.   Noncompliance with medications    PAF (paroxysmal atrial fibrillation) ([place])    a. 07/2017 afib->broke w/ IV amio->converted to oral bb due to prior intol to oral amio.   Pulmonary hypertension ([place])    Stroke ([place])    a. 06/2018 MRI/A Brain: R paramedian pons late acute/early subacute infarct, 40mm. No assoc hemorrhage or mass effect.  Sev chronic microvascular isch changtes and mod voluem loss. No large vessel occlusion, aneurysm, or significant stenosis.    ASSESSMENT 71 year old male who presents to the HF clinic for a follow up visit. Pt has been out of his medications for several months (d/t homelessness) and has just recently (03/21/21) restarted a few of his medications (Entresto and spironolactone) as these  were the only ones [facility] had refills on. Pt states the others need "authorization from the doctor" - likely need refill authorization. At his last visit, pt reported only taking Entresto once daily and was educated to take twice daily - pt reports he has been taking twice daily.   Recent ED Visit (past 6 months): Date - 04/04/20, CC - weakness  PLAN CHF/HTN -continue Entresto 24-26 mg twice daily and spironolactone 25 mg daily -With BP WNL today and HR 84, would recommend restarting metoprolol succinate at 25 mg daily -consider addition of SGLT2i at later visit -continue daily weight checks    Aflutter/afib -follow up with cardiologist (has appointment on 04/27/21) for restarting apixaban 5 mg twice daily -recommend restarting metoprolol succinate at 25 mg daily   Hx of stroke -continue atorvastatin 80 mg daily -consider addition of aspirin 81 mg daily     Time spent: 10 minutes   Swift, Pharm.D. Clinical Pharmacist 04/10/2021 10:28 AM    Current Outpatient Medications:    acetaminophen (TYLENOL) 325 MG tablet, Take 2 tablets (650 mg total) by mouth every 6 (six) hours as needed for fever, headache or moderate pain., Disp: , Rfl:    apixaban (ELIQUIS) 5 MG TABS tablet, Take 5 mg by mouth 2 (two) times daily., Disp: , Rfl:    atorvastatin (LIPITOR) 80 MG tablet, Take 1 tablet by mouth once daily, Disp: 30 tablet, Rfl: 0   furosemide (LASIX) 40 MG tablet, Take 1 tablet (40 mg total) by mouth daily., Disp: 30 tablet, Rfl: 0   iron polysaccharides (NIFEREX) 150 MG capsule, Take 1 capsule (150 mg total) by mouth daily. (Patient not taking: No sig reported), Disp: 90 capsule, Rfl: 0   metoprolol succinate (TOPROL-XL) 50 MG 24 hr tablet, TAKE 1 TABLET BY MOUTH ONCE DAILY. TAKE WITH OR IMMEDIATELY FOLLOWING A MEAL., Disp: 30 tablet, Rfl: 0   pantoprazole (PROTONIX) 40 MG tablet, Take 40 mg by mouth daily., Disp: , Rfl:    sacubitril-valsartan (ENTRESTO) 24-26 MG, Take 1 tablet by mouth 2 (two) times daily., Disp: 60 tablet, Rfl: 3   spironolactone (ALDACTONE) 25 MG tablet, Take 1 tablet by mouth once daily, Disp: 30 tablet, Rfl: 0   vitamin B-12 (CYANOCOBALAMIN) 500 MCG tablet, Take 1 tablet (500 mcg total) by mouth daily. (Patient not taking: No sig reported), Disp: 90 tablet, Rfl: 0   vitamin C (VITAMIN C) 500 MG tablet, Take 0.5 tablets (250 mg total) by mouth daily. (Patient not taking: No sig reported), Disp: 90 tablet, Rfl: 0    DRUGS TO CAUTION IN HEART FAILURE  Drug or Class Mechanism  Analgesics NSAIDs COX-2 inhibitors Glucocorticoids  Sodium and water  retention, increased systemic vascular resistance, decreased response to diuretics   Diabetes Medications Metformin Thiazolidinediones Rosiglitazone (Avandia) Pioglitazone (Actos) DPP4 Inhibitors Saxagliptin (Onglyza) Sitagliptin (Januvia)   Lactic acidosis Possible calcium channel blockade   Unknown  Antiarrhythmics Class I  Flecainide Disopyramide Class III Sotalol Other Dronedarone  Negative inotrope, proarrhythmic   Proarrhythmic, beta blockade  Negative inotrope  Antihypertensives Alpha Blockers Doxazosin Calcium Channel Blockers Diltiazem Verapamil Nifedipine Central Alpha Adrenergics Moxonidine Peripheral Vasodilators Minoxidil  Increases renin and aldosterone  Negative inotrope    Possible sympathetic withdrawal  Unknown  Anti-infective Itraconazole Amphotericin B  Negative inotrope Unknown  Hematologic Anagrelide Cilostazol   Possible inhibition of PD IV Inhibition of PD III causing arrhythmias  Neurologic/Psychiatric Stimulants Anti-Seizure Drugs Carbamazepine Pregabalin Antidepressants Tricyclics Citalopram Parkinsons Bromocriptine Pergolide Pramipexole Antipsychotics Clozapine Antimigraine Ergotamine Methysergide  Appetite suppressants Bipolar Lithium  Peripheral alpha and beta agonist activity  Negative inotrope and chronotrope Calcium channel blockade  Negative inotrope, proarrhythmic Dose-dependent QT prolongation  Excessive serotonin activity/valvular damage Excessive serotonin activity/valvular damage Unknown  IgE mediated hypersensitivy, calcium channel blockade  Excessive serotonin activity/valvular damage Excessive serotonin activity/valvular damage Valvular damage  Direct myofibrillar degeneration, adrenergic stimulation  Antimalarials Chloroquine Hydroxychloroquine Intracellular inhibition of lysosomal enzymes  Urologic Agents Alpha Blockers Doxazosin Prazosin Tamsulosin Terazosin   Increased renin and aldosterone  Adapted from Page Carleene Overlie, et al. Drugs That May Cause or Exacerbate Heart Failure: A Scientific Statement from the American Heart  Association. Circulation 2016; 134:e32-e69. DOI: 10.1161/CIR.0000000000000426   MEDICATION ADHERENCES TIPS AND STRATEGIES Taking medication as prescribed improves patient outcomes in heart failure (reduces hospitalizations, improves symptoms, increases survival) Side effects of medications can be managed by decreasing doses, switching agents, stopping drugs, or adding additional therapy. Please let someone in the [facility] know if you have having bothersome side effects so we can modify your regimen. Do not alter your medication regimen without talking to [place].  Medication reminders can help patients remember to take drugs on time. If you are missing or forgetting doses you can try linking behaviors, using pill boxes, or an electronic reminder like an alarm on your phone or an app. Some people can also get automated phone calls as medication reminders.

## 2021-04-10 NOTE — Patient Instructions (Addendum)
The [facility] Clinic will be moving around the corner to [address] mid-February. Our phone number will remain the same.  Start taking metoprolol once/daily.  Continue to weigh daily. Call if weight gain > 2 lbs/day or 5 lbs/week.   Return in 1 month.

## 2021-04-16 DIAGNOSIS — D509 Iron deficiency anemia, unspecified: Secondary | ICD-10-CM | POA: Diagnosis not present

## 2021-04-16 DIAGNOSIS — R7301 Impaired fasting glucose: Secondary | ICD-10-CM | POA: Diagnosis not present

## 2021-04-16 DIAGNOSIS — I10 Essential (primary) hypertension: Secondary | ICD-10-CM | POA: Diagnosis not present

## 2021-04-16 DIAGNOSIS — Z0001 Encounter for general adult medical examination with abnormal findings: Secondary | ICD-10-CM | POA: Diagnosis not present

## 2021-04-17 ENCOUNTER — Other Ambulatory Visit: Payer: Self-pay | Admitting: Cardiovascular Disease

## 2021-04-17 DIAGNOSIS — I6389 Other cerebral infarction: Secondary | ICD-10-CM | POA: Diagnosis not present

## 2021-04-17 DIAGNOSIS — I639 Cerebral infarction, unspecified: Secondary | ICD-10-CM | POA: Diagnosis not present

## 2021-04-17 DIAGNOSIS — I69959 Hemiplegia and hemiparesis following unspecified cerebrovascular disease affecting unspecified side: Secondary | ICD-10-CM | POA: Diagnosis not present

## 2021-04-17 DIAGNOSIS — S335XXA Sprain of ligaments of lumbar spine, initial encounter: Secondary | ICD-10-CM | POA: Diagnosis not present

## 2021-04-17 DIAGNOSIS — N184 Chronic kidney disease, stage 4 (severe): Secondary | ICD-10-CM | POA: Diagnosis not present

## 2021-04-17 DIAGNOSIS — I5022 Chronic systolic (congestive) heart failure: Secondary | ICD-10-CM | POA: Diagnosis not present

## 2021-04-17 MED ORDER — SPIRONOLACTONE 25 MG PO TABS: 25.0000 mg | ORAL_TABLET | Freq: Every day | ORAL | 0 refills | Status: DC

## 2021-04-17 NOTE — Telephone Encounter (Signed)
Entry error. Please disregard previous message. Will refill atorvastatin at appointment on 04/27/2021.

## 2021-04-17 NOTE — Telephone Encounter (Signed)
Per Dr. Donivan Scull last office note on 10/17/20, patient was on atorvastatin but it was not on last AVS and has been removed from med list. Please advise of OK to refill 80 mg daily. Thank you!

## 2021-04-24 ENCOUNTER — Telehealth: Payer: Self-pay | Admitting: Family

## 2021-04-24 NOTE — Telephone Encounter (Signed)
Notified patient that I need him to come in to sign application for [employer] and bring proof of income for renewal of patient assistance.     Gethsemane Fischler, NT

## 2021-04-27 ENCOUNTER — Ambulatory Visit (INDEPENDENT_AMBULATORY_CARE_PROVIDER_SITE_OTHER): Payer: Medicare Other | Admitting: Cardiovascular Disease

## 2021-04-27 ENCOUNTER — Other Ambulatory Visit: Payer: Self-pay

## 2021-04-27 ENCOUNTER — Encounter: Payer: Self-pay | Admitting: Cardiovascular Disease

## 2021-04-27 VITALS — BP 114/75 | HR 71 | Ht 77.0 in | Wt 236.0 lb

## 2021-04-27 DIAGNOSIS — D649 Anemia, unspecified: Secondary | ICD-10-CM

## 2021-04-27 DIAGNOSIS — D509 Iron deficiency anemia, unspecified: Secondary | ICD-10-CM | POA: Diagnosis not present

## 2021-04-27 DIAGNOSIS — E782 Mixed hyperlipidemia: Secondary | ICD-10-CM | POA: Diagnosis not present

## 2021-04-27 DIAGNOSIS — I10 Essential (primary) hypertension: Secondary | ICD-10-CM | POA: Diagnosis not present

## 2021-04-27 DIAGNOSIS — I5022 Chronic systolic (congestive) heart failure: Secondary | ICD-10-CM

## 2021-04-27 DIAGNOSIS — I428 Other cardiomyopathies: Secondary | ICD-10-CM

## 2021-04-27 DIAGNOSIS — I480 Paroxysmal atrial fibrillation: Secondary | ICD-10-CM

## 2021-04-27 MED ORDER — ENTRESTO 24-26 MG PO TABS: 1.0000 | ORAL_TABLET | Freq: Two times a day (BID) | ORAL | 6 refills | Status: DC

## 2021-04-27 MED ORDER — ATORVASTATIN CALCIUM 80 MG PO TABS: 80.0000 mg | ORAL_TABLET | Freq: Every day | ORAL | 6 refills | Status: DC

## 2021-04-27 MED ORDER — SPIRONOLACTONE 25 MG PO TABS: 25.0000 mg | ORAL_TABLET | Freq: Every day | ORAL | 6 refills | Status: DC

## 2021-04-27 NOTE — Progress Notes (Addendum)
., Cardiology Office Note  Date:  04/27/2021   ID:  Nathan [name]., DOB [birth year], MRN [id number]  PCP:  [name], MD   Cc: Out of medications  HPI:  71 y.o. male with history of  CAD tobacco abuse quit 04/28/17  Admission to the hospital 04/2017 with new onset atrial flutter with RVR,  elevated troponin,  acute combined systolic and diastolic CHF Ejection fraction 20-25% February 13th 2019 S/p TEE and cardioversion 05/01/2017 Back into atrial flutter, ejection fraction 20-25% Flutter ablation 06/16/2017 Was in atrial fibrillation on office visit with Dr. [name] July 05 2017 Interval atrial fibrillation and developed a stroke 4/20 currently anticoagulated with Eliquis. Echo 06/2018: ef 40 to 45% Who presents for follow up of his cardiomyopathy, atrial flutter , history of cardioversion  Last seen in clinic by myself October 2021 Has been followed by CHF clinic Admission to the hospital January 2022 symptomatic anemia, CHF Hemoglobin 7.4  s/p Transfuse 1 unit of blood which is ordered by ED physician. anemia panel, iron saturation 8%,  Out of medication for months, Was packed and moving Reports everything still in boxes May be taking his Lipitor, metoprolol, Entresto, not sure about spironolactone Out of Lasix  Not smoking No regular exercise Uses a cane, gait instability  Lab work reviewed Cr 1.5  Echo 6/22 reviewed in detail today  1. Left ventricular ejection fraction, by estimation, is 55 %. The left  ventricle has normal function. Unable to exclude hypokinesis of the basal  to mid inferior wall. There is mild left ventricular hypertrophy.   2. Right ventricular systolic function is normal. The right ventricular  size is normal.   3. The mitral valve is normal in structure. No evidence of mitral valve  regurgitation. No evidence of mitral stenosis.   CVA 06/2018: left side, left hand back 80%, not as strong  Left leg weak, unsteady gait Doing  well  EKG personally reviewed by myself on todays visit Shows normal sinus rhythm rate 71 bpm short PR no significant ST-T wave changes  Other past medical history reviewed cardiac catheterization 07/18/2017 showing nonobstructive disease Heavy calcification markedly elevated left ventricular end-diastolic pressures Insisted on leaving the hospital 07/19/2017  Discharged in normal sinus rhythm Lasix up to 40 twice a day with potassium 20 twice a day   atrial flutter with RVR with 2:1 AV block with heart rates in the 120s bpm Despite advnacing meds He was refusing  metoprolol  -Echo showed EF 20-25% (done while tachycardic), left atrium 54 mm -CHADS2VASC at least least 2 (CHF, age x 1)  converted with ablation  Elevated pressures obtained through echocardiogram, TEE D/c  on Lasix 40 daily   PMH:   has a past medical history of Atrial flutter ([name]), Chronic combined systolic and diastolic CHF (congestive heart failure) ([name]), CKD (chronic kidney disease), stage II-III, Essential hypertension, GI bleed, NICM (nonischemic cardiomyopathy) ([name]), Noncompliance with medications, PAF (paroxysmal atrial fibrillation) (Nathan [name]), Pulmonary hypertension ([name]), and Stroke ([name]).  PSH:    Past Surgical History:  Procedure Laterality Date   A-FLUTTER ABLATION N/A 06/16/2017   Procedure: A-FLUTTER ABLATION;  Surgeon: [name], MD;  Location: [facility];  Service: Cardiovascular;  Laterality: N/A;   CARDIAC CATHETERIZATION     CARDIOVERSION N/A 05/01/2017   Procedure: CARDIOVERSION;  Surgeon: [name], MD;  Location: [facility] ORS;  Service: Cardiovascular;  Laterality: N/A;   COLONOSCOPY WITH PROPOFOL N/A 04/07/2020   Procedure: COLONOSCOPY WITH PROPOFOL;  Surgeon: Haig Prophet,  [name], MD;  Location: [facility] ENDOSCOPY;  Service: Endoscopy;  Laterality: N/A;   CORONARY ANGIOPLASTY     ESOPHAGOGASTRODUODENOSCOPY N/A 04/07/2020   Procedure: ESOPHAGOGASTRODUODENOSCOPY (EGD);  Surgeon:  [name], MD;  Location: [facility] ENDOSCOPY;  Service: Endoscopy;  Laterality: N/A;   ESOPHAGOGASTRODUODENOSCOPY (EGD) WITH PROPOFOL N/A 02/09/2018   Procedure: ESOPHAGOGASTRODUODENOSCOPY (EGD) WITH PROPOFOL;  Surgeon: [name], MD;  Location: [facility] ENDOSCOPY;  Service: Endoscopy;  Laterality: N/A;   RIGHT/LEFT HEART CATH AND CORONARY ANGIOGRAPHY N/A 07/18/2017   Procedure: RIGHT/LEFT HEART CATH AND CORONARY ANGIOGRAPHY;  Surgeon: Nathan Hampshire, MD;  Location: [name];  Service: Cardiovascular;  Laterality: N/A;   TEE WITHOUT CARDIOVERSION N/A 05/01/2017   Procedure: TRANSESOPHAGEAL ECHOCARDIOGRAM (TEE);  Surgeon: [name], MD;  Location: [facility] ORS;  Service: Cardiovascular;  Laterality: N/A;   TEE WITHOUT CARDIOVERSION N/A 06/16/2017   Procedure: TRANSESOPHAGEAL ECHOCARDIOGRAM (TEE);  Surgeon: [name], MD;  Location: [facility] ENDOSCOPY;  Service: Cardiovascular;  Laterality: N/A;    Current Outpatient Medications  Medication Sig Dispense Refill   acetaminophen (TYLENOL) 500 MG tablet Take 500-1,000 mg by mouth 2 (two) times daily as needed for mild pain or moderate pain.     atorvastatin (LIPITOR) 80 MG tablet Take 1 tablet by mouth once daily 30 tablet 0   metoprolol succinate (TOPROL-XL) 25 MG 24 hr tablet TAKE 1 TABLET BY MOUTH ONCE DAILY. TAKE WITH OR IMMEDIATELY FOLLOWING A MEAL. 90 tablet 3   sacubitril-valsartan (ENTRESTO) 24-26 MG Take 1 tablet by mouth 2 (two) times daily.     spironolactone (ALDACTONE) 25 MG tablet Take 1 tablet (25 mg total) by mouth daily. 30 tablet 0   tiZANidine (ZANAFLEX) 2 MG tablet Take 2 mg by mouth 3 (three) times daily.     No current facility-administered medications for this visit.    Allergies:   Patient has no known allergies.   Social History:  The patient  reports that he quit smoking about 4 years ago. His smoking use included cigarettes. He has a 3.00 pack-year smoking history. He has never used  smokeless tobacco. He reports current alcohol use of about 3.0 standard drinks per week. He reports that he does not use drugs.   Family History:   family history includes Alzheimer's disease in his father and mother.    Review of Systems: Review of Systems  Constitutional: Negative.   Respiratory: Negative.    Cardiovascular: Negative.   Gastrointestinal: Negative.   Musculoskeletal: Negative.   Neurological: Negative.   Psychiatric/Behavioral: Negative.    All other systems reviewed and are negative.   PHYSICAL EXAM: VS:  BP 114/75    Pulse 71    Ht 6\' 5"  (1.956 m)    Wt 236 lb (107 kg)    SpO2 98%    BMI 27.99 kg/m  , BMI Body mass index is 27.99 kg/m.  Constitutional:  oriented to person, place, and time. No distress.  HENT:  Head: Grossly normal Eyes:  no discharge. No scleral icterus.  Neck: No JVD, no carotid bruits  Cardiovascular: Regular rate and rhythm, no murmurs appreciated Pulmonary/Chest: Clear to auscultation bilaterally, no wheezes or rails Abdominal: Soft.  no distension.  no tenderness.  Musculoskeletal: Normal range of motion Neurological:  normal muscle tone. Coordination normal. No atrophy Skin: Skin warm and dry Psychiatric: normal affect, pleasant  Recent Labs: 08/07/2020: ALT 12; BUN 19; Creatinine, Ser 1.50; Hemoglobin 13.3; Platelets 258; Potassium 4.1; Sodium 137; TSH 2.054  Lipid Panel Lab Results  Component Value Date   CHOL 161 03/25/2019   HDL 79 03/25/2019   LDLCALC 61 03/25/2019   TRIG 103 03/25/2019      Wt Readings from Last 3 Encounters:  04/27/21 236 lb (107 kg)  08/29/20 228 lb 2 oz (103.5 kg)  08/07/20 234 lb (106.1 kg)     ASSESSMENT AND PLAN:  Atrial flutter with rapid ventricular response (HCC)  Prior ablation History of stroke,  Continue metoprolol Presumably not on Eliquis secondary to anemia Reports he will not take anticoagulation Long discussion concerning risk and benefit, stroke, again declining  anticoagulation  History of stroke Reports residual left side hand weakness, left leg weakness chronic swelling left leg No new events  dilated cardiomyopathy /tachycardia mediated Stressed importance of medication compliance prescriptions refilled.  Reports most of his medications in the box as he is moving  ejection fraction 40 to 45% on echo April 2020 Ejection fraction has improved to 55% on echo June 2022  Pulmonary HTN ([place]) - Plan: EKG 12-Lead Continue Lasix, Lab work requested from primary care [facility] May [facility]22 in the system  Acute on chronic combined systolic and diastolic CHF (congestive heart failure) ([place]) - Improved EF on echo June 2022  Atrial fibrillation Maintaining normal sinus rhythm Intolerant of amiodarone Declining anticoagulation   Total encounter time more than 30 minutes  Greater than 50% was spent in counseling and coordination of care with the patient    No orders of the defined types were placed in this encounter.    Signed, Esmond Plants, M.D., Ph.D. 04/27/2021  [place], [place]

## 2021-04-27 NOTE — Patient Instructions (Signed)
Look for finger pulse -oximeter Monitor heart rate at home, call if it runs fast  Medication Instructions:  No changes  If you need a refill on your cardiac medications before your next appointment, please call your pharmacy.   Lab work: No new labs needed  Testing/Procedures: No new testing needed  Follow-Up: At [facility], you and your health needs are our priority.  As part of our continuing mission to provide you with exceptional heart care, we have created designated Provider Care Teams.  These Care Teams include your primary Cardiologist (physician) and Advanced Practice Providers (APPs -  Physician Assistants and Nurse Practitioners) who all work together to provide you with the care you need, when you need it.  You will need a follow up appointment in 6 months, APP ok  Providers on your designated Care Team:   Murray Hodgkins, NP Christell Faith, PA-C Cadence Kathlen Mody, [place]  COVID-19 Vaccine Information can be found at: [email or web address] For questions related to vaccine distribution or appointments, please email [email or web address] or call [phone].

## 2021-04-30 DIAGNOSIS — I739 Peripheral vascular disease, unspecified: Secondary | ICD-10-CM | POA: Diagnosis not present

## 2021-04-30 DIAGNOSIS — M2041 Other hammer toe(s) (acquired), right foot: Secondary | ICD-10-CM | POA: Diagnosis not present

## 2021-04-30 DIAGNOSIS — M216X2 Other acquired deformities of left foot: Secondary | ICD-10-CM | POA: Diagnosis not present

## 2021-04-30 DIAGNOSIS — M2142 Flat foot [pes planus] (acquired), left foot: Secondary | ICD-10-CM | POA: Diagnosis not present

## 2021-04-30 DIAGNOSIS — M2012 Hallux valgus (acquired), left foot: Secondary | ICD-10-CM | POA: Diagnosis not present

## 2021-04-30 DIAGNOSIS — M2141 Flat foot [pes planus] (acquired), right foot: Secondary | ICD-10-CM | POA: Diagnosis not present

## 2021-04-30 DIAGNOSIS — M216X1 Other acquired deformities of right foot: Secondary | ICD-10-CM | POA: Diagnosis not present

## 2021-04-30 DIAGNOSIS — B351 Tinea unguium: Secondary | ICD-10-CM | POA: Diagnosis not present

## 2021-04-30 DIAGNOSIS — M79674 Pain in right toe(s): Secondary | ICD-10-CM | POA: Diagnosis not present

## 2021-04-30 DIAGNOSIS — M2042 Other hammer toe(s) (acquired), left foot: Secondary | ICD-10-CM | POA: Diagnosis not present

## 2021-04-30 DIAGNOSIS — M79675 Pain in left toe(s): Secondary | ICD-10-CM | POA: Diagnosis not present

## 2021-04-30 DIAGNOSIS — M2011 Hallux valgus (acquired), right foot: Secondary | ICD-10-CM | POA: Diagnosis not present

## 2021-05-10 NOTE — Progress Notes (Signed)
Patient ID: Nathan Ramseyer., male    DOB: [birth year], 71 y.o.   MRN: [id number]  HPI  Nathan [name] is a 71 y/o male with a history of HTN, stroke, previous tobacco use and chronic heart failure.   Echo reports from 08/29/20: Left ventricular ejection fraction, by estimation, is 55 %. The left ventricle has normal function. The left ventricle has no regional wall motion abnormalities. The left ventricular internal cavity size was normal in size. There is mild left ventricular hypertrophy. Left ventricular diastolic parameters are indeterminate. Echo report from 07/01/2018 reviewed and showed an EF of 40-45%. Echo report from 09/04/17 reviewed and showed an EF of 35-40%. Echo report from 06/16/17 reviewed and showed an EF of 15-20% along with mod-severe TR and an elevated PA pressure of 45 mm Hg.  Cardiac catheterization done 07/18/17 and showed heavily calcified coronary arteries with mild nonobstructive disease.  Left dominant system. Right heart catheterization showed moderately to severely elevated filling pressures, severe pulmonary hypertension and severely reduced cardiac output.   No recent ED visits or hospital admissions.   He presents today for a follow-up visit with a chief complaint of minimal fatigue upon moderate exertion. He describes this as chronic in nature. He has associated gradual weight gain along with this. He denies any difficulty sleeping, dizziness, abdominal distention, palpitations, pedal edema, chest pain, shortness of breath or cough.   Taking all medications as prescribed.   Past Medical History:  Diagnosis Date   Atrial flutter ([place])    a. s/p TEE/DCCV 04/2017; b. 06/2017 s/p RFCA; c. CHADS2VASc => 5 (CHF, HTN, age x 1, CVA)-->noncompliant with Xarelto.   Chronic combined systolic and diastolic CHF (congestive heart failure) ([place])    a. TTE 2/19: EF 20-25%, diffuse HK; b. TTE 3/19: EF 20-25%, diffuse HK; c. 08/2017 Echo: EF 35-40%, diff HK. Gr1 DD; d. 06/2018 Echo: EF 40-45%, DD.  Neg bubble study.   CKD (chronic kidney disease), stage II-III    Essential hypertension    GI bleed    a.  Hemorrhagic shock in 11/19 secondary to erosive gastropathy and Barrett's esophagus requiring multiple units PRBCs; b. Cleared to resume OAC->pt did not.   NICM (nonischemic cardiomyopathy) ([place])    a. 04/2017: Echo EF 20-25%; b. TTE 3/19: EF 20-25%; c. 07/2017 Cath: min irregs; d. 08/2017 Echo: EF 35-40%, diff HK; e. 06/2018 Echo: EF 40-45%, DD.   Noncompliance with medications    PAF (paroxysmal atrial fibrillation) ([place])    a. 07/2017 afib->broke w/ IV amio->converted to oral bb due to prior intol to oral amio.   Pulmonary hypertension ([place])    Stroke ([place])    a. 06/2018 MRI/A Brain: R paramedian pons late acute/early subacute infarct, 35mm. No assoc hemorrhage or mass effect.  Sev chronic microvascular isch changtes and mod voluem loss. No large vessel occlusion, aneurysm, or significant stenosis.   Past Surgical History:  Procedure Laterality Date   A-FLUTTER ABLATION N/A 06/16/2017   Procedure: A-FLUTTER ABLATION;  Surgeon: [name], MD;  Location: [facility];  Service: Cardiovascular;  Laterality: N/A;   CARDIAC CATHETERIZATION     CARDIOVERSION N/A 05/01/2017   Procedure: CARDIOVERSION;  Surgeon: [name], MD;  Location: [facility] OR[facility];  Service: Cardiovascular;  Laterality: N/A;   COLONOSCOPY WITH PROPOFOL N/A 04/07/2020   Procedure: COLONOSCOPY WITH PROPOFOL;  Surgeon: [name], MD;  Location: [facility] ENDOSCOPY;  Service: Endoscopy;  Laterality: N/A;   CORONARY ANGIOPLASTY     ESOPHAGOGASTRODUODENOSCOPY N/A 04/07/2020  Procedure: ESOPHAGOGASTRODUODENOSCOPY (EGD);  Surgeon: [name], MD;  Location: [facility] ENDOSCOPY;  Service: Endoscopy;  Laterality: N/A;   ESOPHAGOGASTRODUODENOSCOPY (EGD) WITH PROPOFOL N/A 02/09/2018   Procedure: ESOPHAGOGASTRODUODENOSCOPY (EGD) WITH PROPOFOL;  Surgeon: [name], MD;  Location: [facility] ENDOSCOPY;  Service:  Endoscopy;  Laterality: N/A;   RIGHT/LEFT HEART CATH AND CORONARY ANGIOGRAPHY N/A 07/18/2017   Procedure: RIGHT/LEFT HEART CATH AND CORONARY ANGIOGRAPHY;  Surgeon: [name], MD;  Location: [place] [facility];  Service: Cardiovascular;  Laterality: N/A;   TEE WITHOUT CARDIOVERSION N/A 05/01/2017   Procedure: TRANSESOPHAGEAL ECHOCARDIOGRAM (TEE);  Surgeon: [name], MD;  Location: [facility] OR[facility];  Service: Cardiovascular;  Laterality: N/A;   TEE WITHOUT CARDIOVERSION N/A 06/16/2017   Procedure: TRANSESOPHAGEAL ECHOCARDIOGRAM (TEE);  Surgeon: [name], MD;  Location: [facility] ENDOSCOPY;  Service: Cardiovascular;  Laterality: N/A;   Family History  Problem Relation Age of Onset   Alzheimer's disease Mother    Alzheimer's disease Father    Social History   Tobacco Use   Smoking status: Former    Packs/day: 1.00    Years: 3.00    Pack years: 3.00    Types: Cigarettes    Quit date: 04/21/2017    Years since quitting: 4.0   Smokeless tobacco: Never  Substance Use Topics   Alcohol use: Yes    Alcohol/week: 3.0 standard drinks    Types: 3 Cans of beer per week    Comment: Drink a half of a 40oz beer every day, drank heavily in the past   No Known Allergies  Prior to Admission medications   Medication Sig Start Date End Date Taking? Authorizing Provider  acetaminophen (TYLENOL) 500 MG tablet Take 500-1,000 mg by mouth 2 (two) times daily as needed for mild pain or moderate pain.   Yes [provider]  atorvastatin (LIPITOR) 80 MG tablet Take 1 tablet (80 mg total) by mouth daily. 04/27/21  Yes Gollan, Kathlene November, MD  metoprolol succinate (TOPROL-XL) 25 MG 24 hr tablet TAKE 1 TABLET BY MOUTH ONCE DAILY. TAKE WITH OR IMMEDIATELY FOLLOWING A MEAL. 04/10/21  Yes Loye Reininger A, FNP  sacubitril-valsartan (ENTRESTO) 24-26 MG Take 1 tablet by mouth 2 (two) times daily. 04/27/21  Yes [name], MD  spironolactone (ALDACTONE) 25 MG tablet Take 1 tablet (25 mg total) by  mouth daily. 04/27/21  Yes Gollan, Kathlene November, MD  tiZANidine (ZANAFLEX) 2 MG tablet Take 2 mg by mouth 3 (three) times daily. 03/21/21  Yes [provider]   Review of Systems  Constitutional:  Positive for fatigue (minimal). Negative for appetite change and unexpected weight change.  HENT:  Negative for congestion, postnasal drip and sore throat.   Eyes: Negative.   Respiratory:  Negative for cough, chest tightness and shortness of breath.   Cardiovascular:  Negative for chest pain, palpitations and leg swelling.  Gastrointestinal:  Negative for abdominal distention and abdominal pain.  Endocrine: Negative.   Genitourinary: Negative.   Musculoskeletal:  Negative for arthralgias and back pain.  Skin: Negative.   Allergic/Immunologic: Negative.   Neurological:  Negative for dizziness and light-headedness.  Hematological:  Negative for adenopathy. Does not bruise/bleed easily.  Psychiatric/Behavioral:  Negative for dysphoric mood and sleep disturbance. The patient is not nervous/anxious.    Vitals:   05/11/21 1016  BP: 123/63  Pulse: 75  Resp: 16  SpO2: 99%  Weight: 236 lb (107 kg)  Height: 6\' 5"  (1.956 m)   Wt Readings from Last 3 Encounters:  05/11/21 236 lb (107 kg)  04/27/21 236 lb (107 kg)  08/29/20 228 lb 2 oz (103.5 kg)   Lab Results  Component Value Date   CREATININE 1.50 (H) 08/07/2020   CREATININE 1.17 04/08/2020   CREATININE 1.18 04/07/2020   Physical Exam Vitals and nursing note reviewed.  Constitutional:      General: He is not in acute distress.    Appearance: Normal appearance. He is not ill-appearing.  HENT:     Head: Normocephalic and atraumatic.  Cardiovascular:     Rate and Rhythm: Normal rate and regular rhythm.  Pulmonary:     Effort: Pulmonary effort is normal. No respiratory distress.     Breath sounds: No wheezing or rales.  Abdominal:     General: Abdomen is flat. There is no distension.     Palpations: Abdomen is soft.   Musculoskeletal:        General: No tenderness.     Cervical back: Normal range of motion and neck supple.     Right lower leg: No edema.     Left lower leg: No edema.  Skin:    General: Skin is warm and dry.  Neurological:     General: No focal deficit present.     Mental Status: He is alert and oriented to person, place, and time.  Psychiatric:        Mood and Affect: Mood normal.        Behavior: Behavior normal.        Thought Content: Thought content normal.   Assessment & Plan:  1: Chronic heart failure with mildly reduced ejection fraction- - NYHA class II - euvolemic today - weighing daily at home and says that he's slowly gained some weight; Reviewed the importance of calling for any overnight weight gain of >2 pounds or a weekly weight gain of >5 pounds - did not weigh in the office because he was uncertain about standing long enough without anything to hold onto - not adding salt to his food and tries to eat low sodium foods. Reminded to closely follow a 2000mg  sodium diet. - saw cardiology Rockey Situ) 04/27/21 - on GDMT of metoprolol, entresto and spironolactone - only recently resumed all his medications; consider adding SGLT2 at next visit - BNP on 04/04/20 was 138.8  2: HTN- - BP looks good (123/63) - saw PCP (Tejan-Sie) 04/17/21 & had labs done; will request those labs - BMP from 04/08/20 reviewed and showed sodium 138, potassium 4.2, creatinine 1.17 and GFR 42  3: Atrial fibrillation- - he had discussion with cardiology regarding anticoagulation and he has declined with understanding stroke risk   Patient brought the medications he is currently taking.   Return in 5 months, sooner if needed.

## 2021-05-11 ENCOUNTER — Ambulatory Visit: Payer: Medicare Other | Attending: Family | Admitting: Family

## 2021-05-11 ENCOUNTER — Encounter: Payer: Self-pay | Admitting: Family

## 2021-05-11 ENCOUNTER — Other Ambulatory Visit: Payer: Self-pay

## 2021-05-11 VITALS — BP 123/63 | HR 75 | Resp 16 | Ht 77.0 in | Wt 236.0 lb

## 2021-05-11 DIAGNOSIS — I10 Essential (primary) hypertension: Secondary | ICD-10-CM

## 2021-05-11 DIAGNOSIS — I4891 Unspecified atrial fibrillation: Secondary | ICD-10-CM | POA: Diagnosis not present

## 2021-05-11 DIAGNOSIS — I130 Hypertensive heart and chronic kidney disease with heart failure and stage 1 through stage 4 chronic kidney disease, or unspecified chronic kidney disease: Secondary | ICD-10-CM | POA: Insufficient documentation

## 2021-05-11 DIAGNOSIS — I5022 Chronic systolic (congestive) heart failure: Secondary | ICD-10-CM | POA: Diagnosis not present

## 2021-05-11 DIAGNOSIS — Z8673 Personal history of transient ischemic attack (TIA), and cerebral infarction without residual deficits: Secondary | ICD-10-CM | POA: Insufficient documentation

## 2021-05-11 DIAGNOSIS — Z79899 Other long term (current) drug therapy: Secondary | ICD-10-CM | POA: Diagnosis not present

## 2021-05-11 DIAGNOSIS — R2681 Unsteadiness on feet: Secondary | ICD-10-CM | POA: Diagnosis not present

## 2021-05-11 DIAGNOSIS — I480 Paroxysmal atrial fibrillation: Secondary | ICD-10-CM | POA: Diagnosis not present

## 2021-05-11 DIAGNOSIS — I071 Rheumatic tricuspid insufficiency: Secondary | ICD-10-CM | POA: Diagnosis not present

## 2021-05-11 DIAGNOSIS — Z87891 Personal history of nicotine dependence: Secondary | ICD-10-CM | POA: Diagnosis not present

## 2021-05-11 DIAGNOSIS — I272 Pulmonary hypertension, unspecified: Secondary | ICD-10-CM | POA: Diagnosis not present

## 2021-05-11 DIAGNOSIS — N189 Chronic kidney disease, unspecified: Secondary | ICD-10-CM | POA: Insufficient documentation

## 2021-05-11 DIAGNOSIS — I509 Heart failure, unspecified: Secondary | ICD-10-CM | POA: Diagnosis not present

## 2021-05-11 DIAGNOSIS — I5042 Chronic combined systolic (congestive) and diastolic (congestive) heart failure: Secondary | ICD-10-CM | POA: Insufficient documentation

## 2021-05-11 DIAGNOSIS — I69898 Other sequelae of other cerebrovascular disease: Secondary | ICD-10-CM | POA: Diagnosis not present

## 2021-05-11 NOTE — Patient Instructions (Signed)
Continue weighing daily and call for an overnight weight gain of 3 pounds or more or a weekly weight gain of more than 5 pounds.  °

## 2021-05-22 DIAGNOSIS — H25813 Combined forms of age-related cataract, bilateral: Secondary | ICD-10-CM | POA: Diagnosis not present

## 2021-06-19 ENCOUNTER — Telehealth: Payer: Self-pay | Admitting: Cardiovascular Disease

## 2021-06-19 NOTE — Telephone Encounter (Signed)
? ?  Pre-operative Risk Assessment  ?  ?Patient Name: Dashawn Bartnick.  ?DOB: 01-Aug-1950 ?MRN: [id number]  ? ? ? ?Request for Surgical Clearance   ? ?Procedure:  not listed ? ?Date of Surgery:  Clearance TBD                              ?   ?Surgeon:  Ron Agee  ?Surgeon's Group or Practice [facility] ?Phone number:  [phone] ?Fax number:  [phone] ?  ?Type of Clearance Requested:   ?- Medical  ?  ?Type of Anesthesia:  Local  ?  ?Additional requests/questions:   ? ?Signed, ?Pilar A Ham   ?06/19/2021, 4:00 PM   ?

## 2021-06-19 NOTE — Telephone Encounter (Signed)
We will need to know the exact nature of the procedure.  Procedure: not listed ?

## 2021-06-20 NOTE — Telephone Encounter (Signed)
I tried to reach the DDS office for clarification as to the procedure to be done at this time. If pt is has a multi Tx plan in place with the DDS, cardiology will only review clearance request for the Tx be ing done at the time, and will not provide a blanket type clearance. No answered the phone when I hit option 3 (for all other calls). I will fax these notes to DDS office to please fax over a new clearance request with the procedure being done at this time.  We have all other information, we just need the procedure being done. If the pt is having teeth extracted, please tell us HOW MANY teeth are being extracted, please do not tell what number tooth location in the mouth. I appreciate your help in this matter so that we may take care of the pt.  ?

## 2021-06-22 NOTE — Telephone Encounter (Signed)
I tried again today to reach the DDS office, left detailed message need new fax with only the procedure to be done at this time. I will fax these again to requesting office, to please see previous notes.  ?

## 2021-06-23 IMAGING — DX DG CHEST 1V PORT
1 series · 1 of 1 positions shown · non-contrast
Comparison: 07/06/2018 and prior.

CLINICAL DATA: weakness

EXAM:
PORTABLE CHEST 1 VIEW

[chest ap]
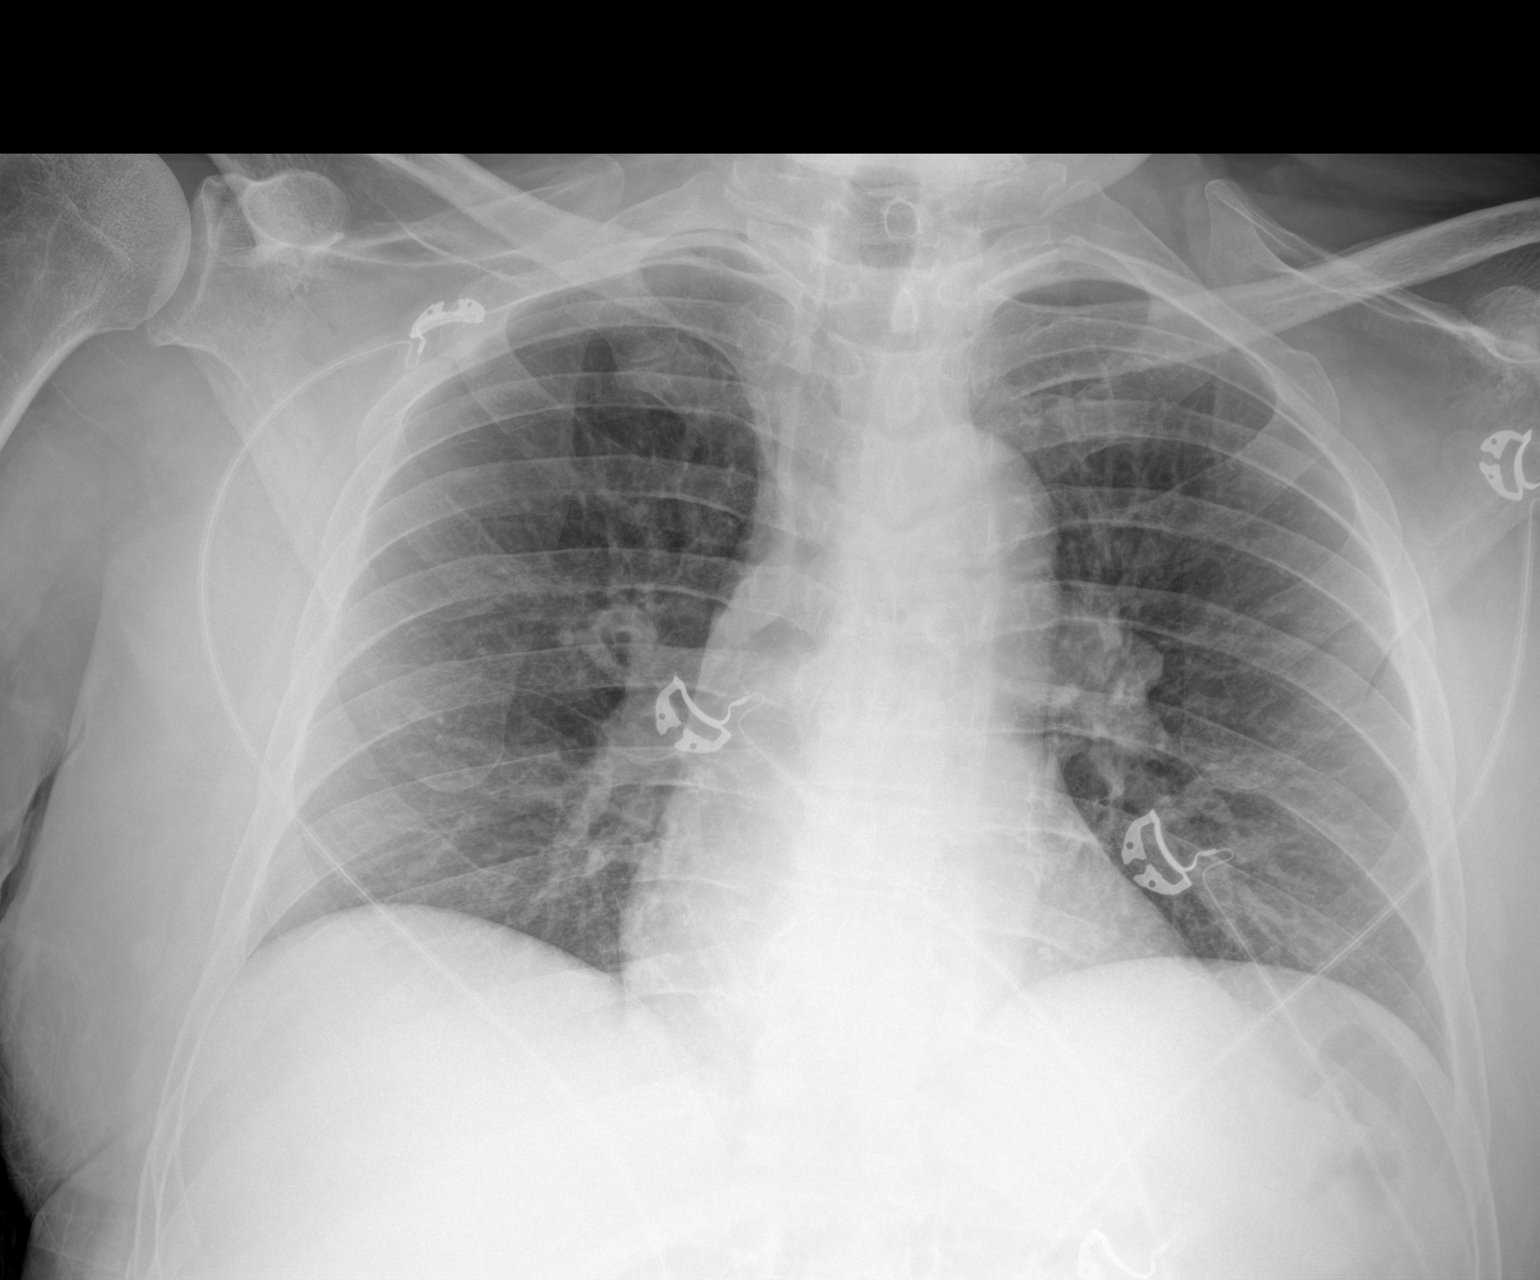

[1 of 1 positions shown; findings below may reference images not displayed]

FINDINGS: No focal consolidation. Mild hypoinflation. No pneumothorax or
pleural effusion. Cardiomediastinal silhouette is within normal
limits. No acute osseous abnormality.
IMPRESSION: No focal airspace disease.

## 2021-06-23 IMAGING — CT CT HEAD W/O CM
3 series · 16 of 47 positions shown, 19 images · non-contrast
Comparison: MRI head June 30, 2018 and CT head June 30, 2018.

CLINICAL DATA: Stroke suspected, history of stroke, patient found
down soiled with urine and feces.

EXAM:
CT HEAD WITHOUT CONTRAST
TECHNIQUE: Contiguous axial images were obtained from the base of the skull
through the vertex without intravenous contrast.

[Series 3: head wo · axial · 0.43mm/px · z∈[-187,-57]mm · 10 of 32 slices shown, 13 images]
[im 3/32  brain]
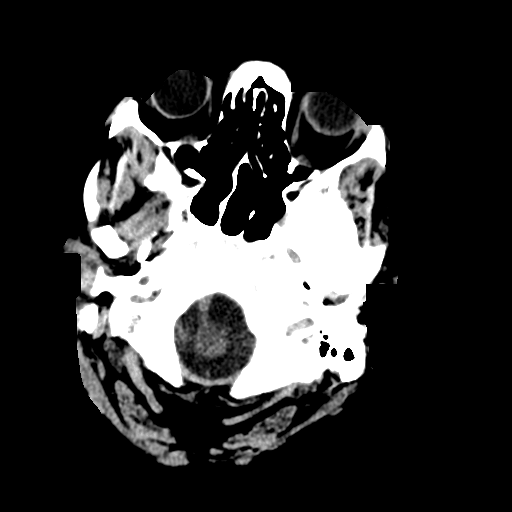
[im 3/32  bone]
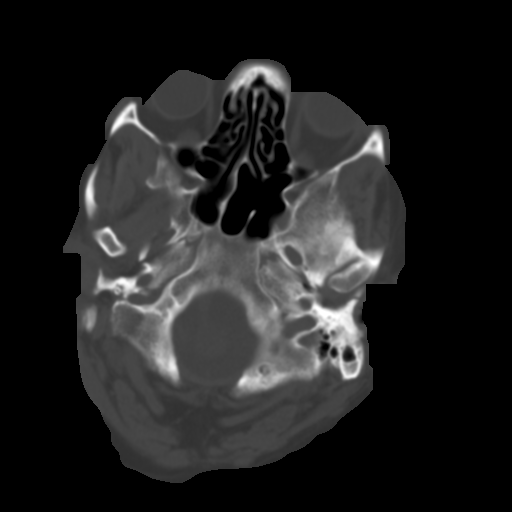
[im 6/32  brain]
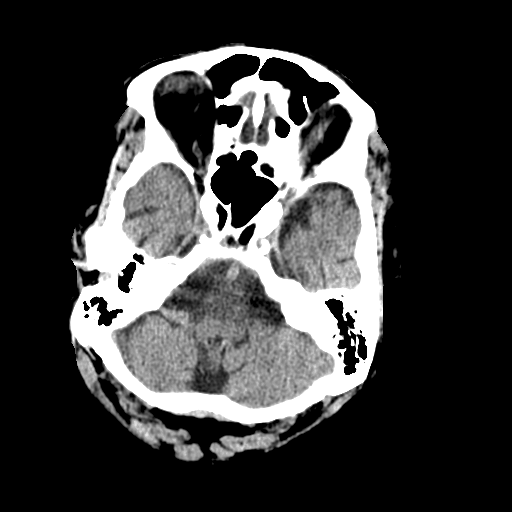
[im 9/32  brain]
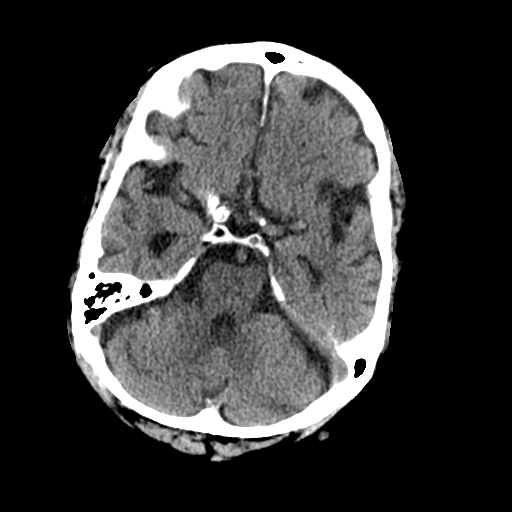
[im 11/32  brain]
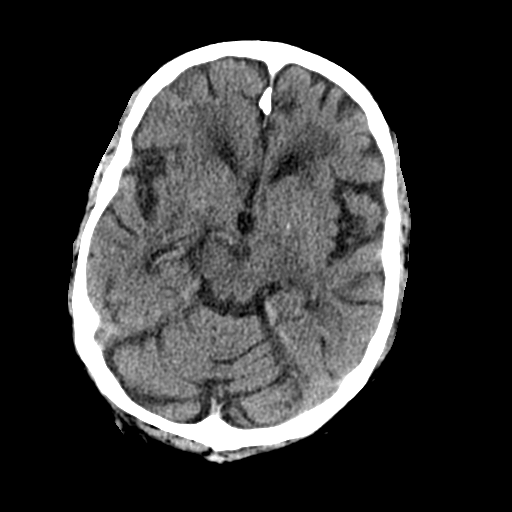
[im 14/32  brain]
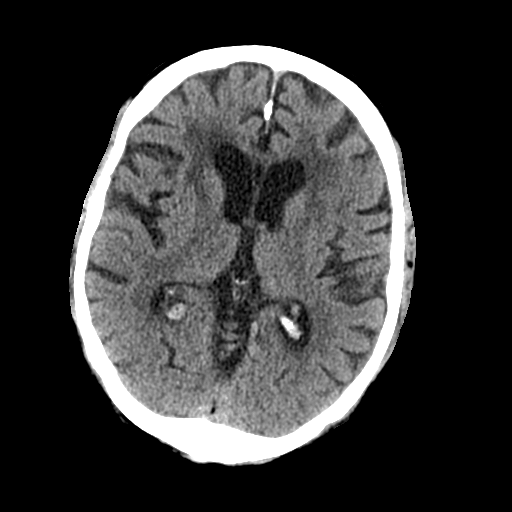
[im 14/32  bone]
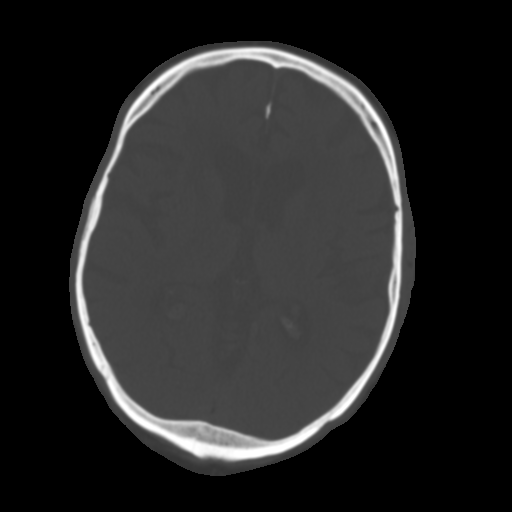
[im 18/32  brain]
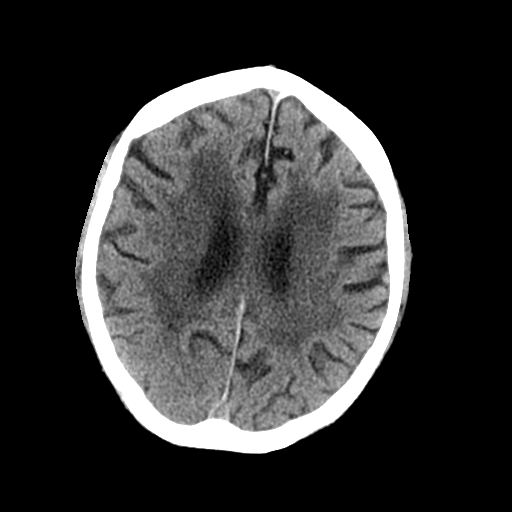
[im 21/32  brain]
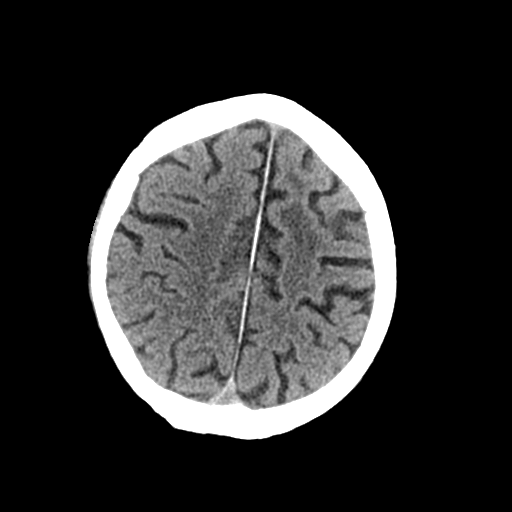
[im 24/32  brain]
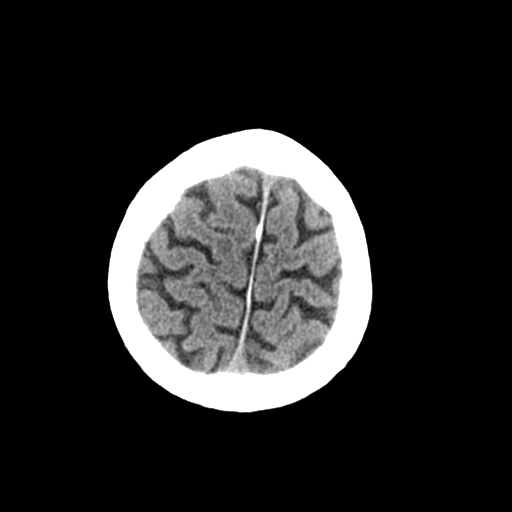
[im 26/32  brain]
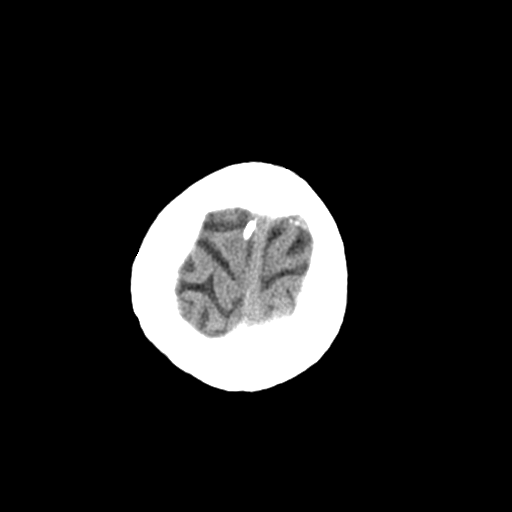
[im 26/32  bone]
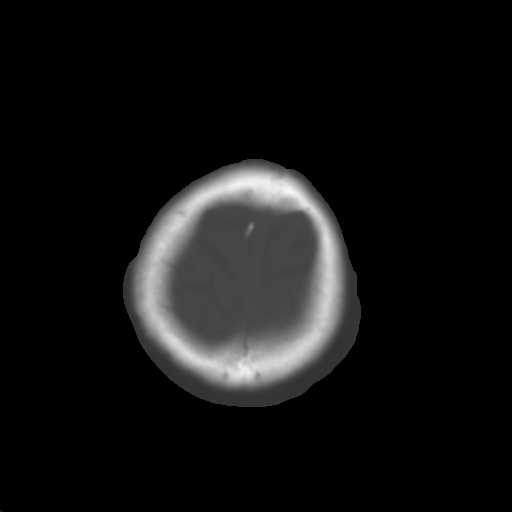
[im 29/32  brain]
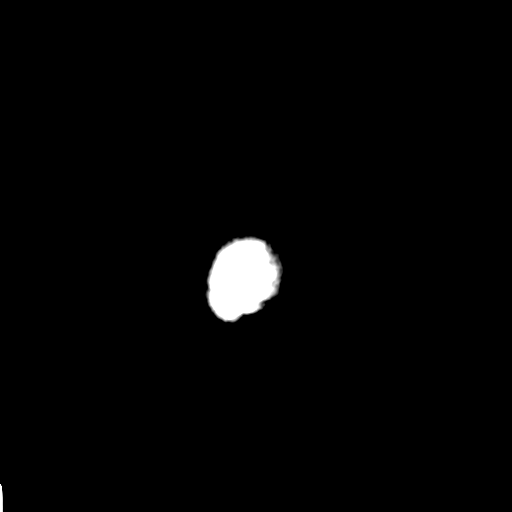

[Series 4: coronal soft tissue · coronal · 0.33mm/px · 3 of 66 slices shown]
[im 22/66  brain]
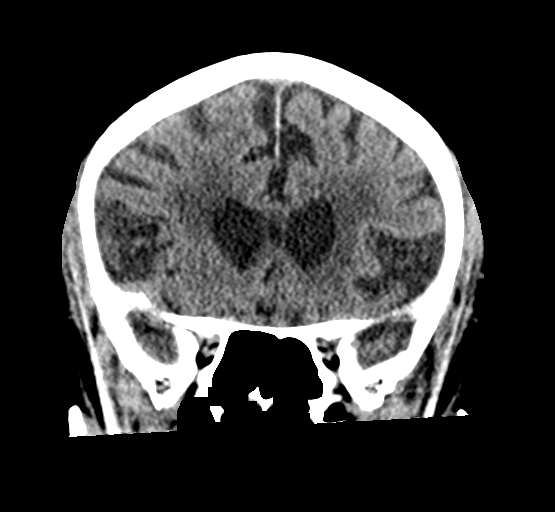
[im 29/66  brain]
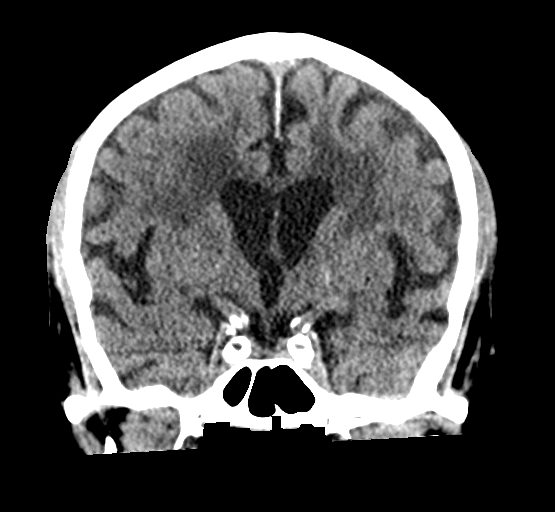
[im 37/66  brain]
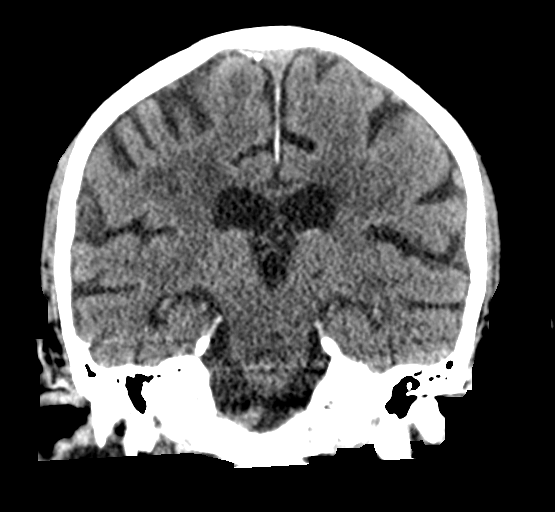

[Series 5: sagittal soft tissue · sagittal · 0.33mm/px · 3 of 60 slices shown]
[im 20/60  brain]
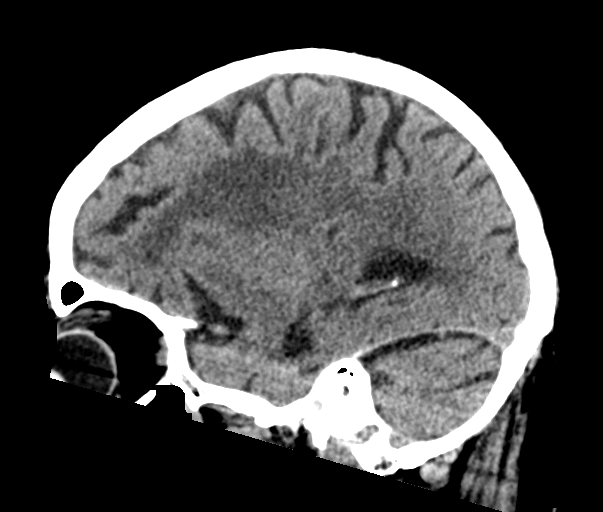
[im 30/60  brain]
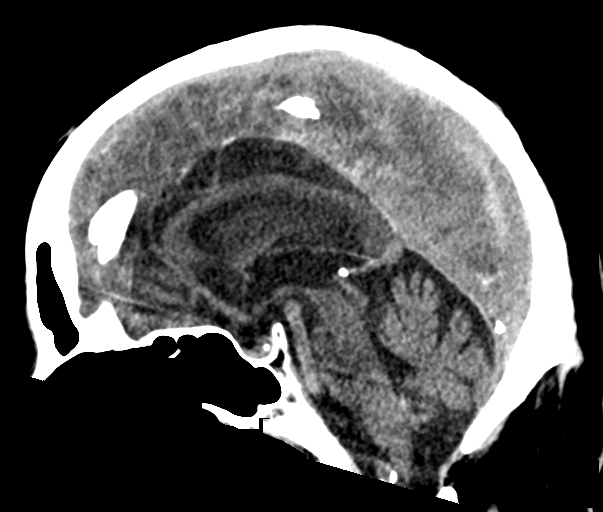
[im 40/60  brain]
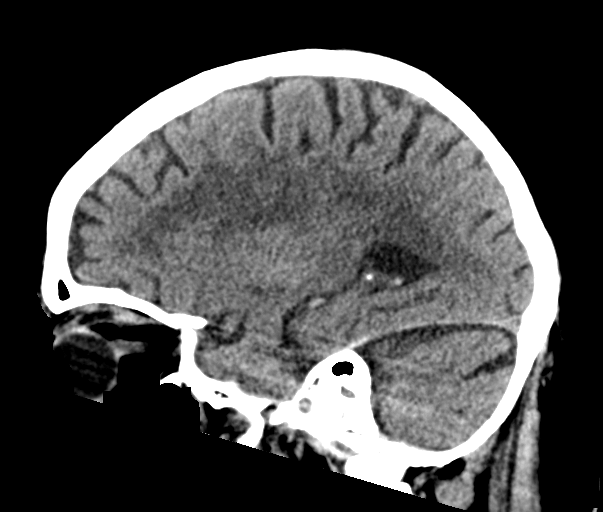

[16 of 47 positions shown; findings below may reference images not displayed]

FINDINGS: Brain: Stable moderate diffuse parenchymal atrophy. There is no
intracranial mass, hemorrhage, extra-axial fluid collection or
midline shift. Similar severe burden of ischemic white matter
disease. Chronic lacunar type infarct in the pons.

Vascular: No hyperdense vessel or unexpected calcification.
Atherosclerotic calcifications of the carotid and vertebral
arteries.

Skull: Normal. Negative for fracture or focal lesion.

Sinuses/Orbits: No acute finding. Prior left cataract removal.
Likely dense cerumen in the bilateral external auditory canals.

Other: None
IMPRESSION: 1. No acute intracranial abnormality.
2. Stable moderate diffuse parenchymal atrophy and severe ischemic
white matter disease.

## 2021-06-25 NOTE — Telephone Encounter (Signed)
Per Dr. Patsey Berthold, pt is having 5 EXTRACTIONS, DATE IS TBD, WITH LOCAL ANESTHESIA  ?

## 2021-06-25 NOTE — Telephone Encounter (Signed)
1st attempt to reach pt regarding surgical clearance and the need for a telephone appointment.  Left a message for pt to call back and ask for the preop team. ?

## 2021-06-25 NOTE — Telephone Encounter (Signed)
Primary Cardiologist:Timothy Rockey Situ, MD ? ?Chart reviewed as part of pre-operative protocol coverage. Because of Loudon Krakow Jr.'s past medical history and time since last visit, he/she will require a virtual visit/telephone call in order to better assess preoperative cardiovascular risk. ? ?Pre-op covering staff: ?- Please contact patient, obtain consent, and schedule appointment  ? ? ?Emmaline Life, NP-C ? ?  ?06/25/2021, 10:35 AM ?[facility] ?[address] ?Office [phone] Fax [phone] ? ?

## 2021-06-26 NOTE — Telephone Encounter (Signed)
Left message x 2 to call the office for a tele pre op appt.  ?

## 2021-06-27 ENCOUNTER — Telehealth: Payer: Self-pay | Admitting: Cardiovascular Disease

## 2021-06-27 ENCOUNTER — Encounter: Payer: Self-pay | Admitting: *Deleted

## 2021-06-27 NOTE — Telephone Encounter (Signed)
Called and notified staff at [facility] that we have left multiple messages for patient and mailed a letter to him and he has not returned our call. Advised we cannot proceed with clearance until patient calls back. Receptionist states she will reach out to patient to return our call.  ?

## 2021-06-27 NOTE — Telephone Encounter (Signed)
Left message x 3 to reach pt to schedule a tele pre op appt. I will send out a letter to the pt today to call the office for appt. I will send FYI to requesting office that we have not been able to reach the pt to make appt. I will remove from the pre op call back pool at this time. Will re-address once pt calls back.  ?

## 2021-06-27 NOTE — Telephone Encounter (Signed)
? ?  Pre-operative Risk Assessment  ?  ?Patient Name: Nathan [name].  ?DOB: 10-19-1950 ?MRN: [id number]  ? ? ? ?Request for Surgical Clearance   ? ?Procedure:  Extraction, 5 teeth ? ?Date of Surgery:  Clearance TBD                              ?   ?Surgeon:  Not listed ?Surgeon's Group or Practice Name:  [facility]  ?Phone number:  [phone] ?Fax number:  [phone] ?  ?Type of Clearance Requested:   ?- Medical  ?  ?Type of Anesthesia:  Not Indicated ?  ?Additional requests/questions:   ? ?Signed, ?Pilar A Ham   ?06/27/2021, 1:59 PM   ?

## 2021-07-10 DIAGNOSIS — N184 Chronic kidney disease, stage 4 (severe): Secondary | ICD-10-CM | POA: Diagnosis not present

## 2021-07-10 DIAGNOSIS — I6389 Other cerebral infarction: Secondary | ICD-10-CM | POA: Diagnosis not present

## 2021-07-10 DIAGNOSIS — D649 Anemia, unspecified: Secondary | ICD-10-CM | POA: Diagnosis not present

## 2021-08-06 NOTE — Progress Notes (Unsigned)
., Cardiology Office Note  Date:  08/07/2021   ID:  Jarrid Lienhard., DOB March 25, 1950, MRN [id number]  PCP:  [name], MD   Chief Complaint  Patient presents with   Pre Operative Clearance    Per Dr. Patsey Berthold, pt is having 5 EXTRACTIONS, DATE IS TBD, WITH LOCAL ANESTHESIA. Medications verbally reviewed with patient.      HPI:  71 y.o. male with history of  CAD tobacco abuse quit 04/28/17  Admission to the hospital 04/2017 with new onset atrial flutter with RVR,  elevated troponin,  acute combined systolic and diastolic CHF Ejection fraction 20-25% February 13th 2019 S/p TEE and cardioversion 05/01/2017 Back into atrial flutter, ejection fraction 20-25% Flutter ablation 06/16/2017 Was in atrial fibrillation on office visit with Dr. [name] July 05 2017 Interval atrial fibrillation and developed a stroke 4/20 ,currently anticoagulated with Eliquis. Echo 06/2018: ef 40 to 45% Who presents for follow up of his cardiomyopathy, atrial flutter , history of cardioversion  Last seen in clinic by myself February 2023 followed by CHF clinic  In follow-up today reports he feels well with no complaints, no leg swelling no shortness of breath no chest pain no PND orthopnea Reports he has his medications His aide picks up his medications from [facility] discussion concerning his tachyarrhythmia, history of atrial fibrillation/flutter Does not want blood thinners, " I almost bled out" Does not want watchman device, does not want to learn more about it  Lab work requested and reviewed from primary care Creatinine 1.6  Prior imaging reviewed Echo 6/22  1. Left ventricular ejection fraction, by estimation, is 55 %. The left  ventricle has normal function. Unable to exclude hypokinesis of the basal  to mid inferior wall. There is mild left ventricular hypertrophy.   2. Right ventricular systolic function is normal. The right ventricular  size is normal.   3. The mitral valve is  normal in structure. No evidence of mitral valve  regurgitation. No evidence of mitral stenosis.   EKG personally reviewed by myself on todays visit Nsr rate 66 no significant ST-T wave changes  Other past medical history reviewed hospital January 2022 symptomatic anemia, CHF Hemoglobin 7.4  s/p Transfuse 1 unit of blood which is ordered by ED physician. anemia panel, iron saturation 8%,    Echo 6/22 reviewed in detail today  1. Left ventricular ejection fraction, by estimation, is 55 %. The left  ventricle has normal function. Unable to exclude hypokinesis of the basal  to mid inferior wall. There is mild left ventricular hypertrophy.   2. Right ventricular systolic function is normal. The right ventricular  size is normal.   3. The mitral valve is normal in structure. No evidence of mitral valve  regurgitation. No evidence of mitral stenosis.   CVA 06/2018: left side, left hand back 80%, not as strong  Left leg weak, unsteady gait Doing well  EKG personally reviewed by myself on todays visit Shows normal sinus rhythm rate 71 bpm short PR no significant ST-T wave changes  Other past medical history reviewed cardiac catheterization 07/18/2017 showing nonobstructive disease Heavy calcification markedly elevated left ventricular end-diastolic pressures Insisted on leaving the hospital 07/19/2017  Discharged in normal sinus rhythm Lasix up to 40 twice a day with potassium 20 twice a day   atrial flutter with RVR with 2:1 AV block with heart rates in the 120s bpm Despite advnacing meds He was refusing  metoprolol  -Echo showed EF 20-25% (done while tachycardic), left atrium  54 mm -CHADS2VASC at least least 2 (CHF, age x 1)  converted with ablation  Elevated pressures obtained through echocardiogram, TEE D/c  on Lasix 40 daily   PMH:   has a past medical history of Atrial flutter ([place]), Chronic combined systolic and diastolic CHF (congestive heart failure) ([place]), CKD  (chronic kidney disease), stage II-III, Essential hypertension, GI bleed, NICM (nonischemic cardiomyopathy) ([place]), Noncompliance with medications, PAF (paroxysmal atrial fibrillation) ([place]), Pulmonary hypertension ([place]), and Stroke ([place]).  PSH:    Past Surgical History:  Procedure Laterality Date   A-FLUTTER ABLATION N/A 06/16/2017   Procedure: A-FLUTTER ABLATION;  Surgeon: [name], MD;  Location: [facility];  Service: Cardiovascular;  Laterality: N/A;   CARDIAC CATHETERIZATION     CARDIOVERSION N/A 05/01/2017   Procedure: CARDIOVERSION;  Surgeon: [name], MD;  Location: [facility];  Service: Cardiovascular;  Laterality: N/A;   COLONOSCOPY WITH PROPOFOL N/A 04/07/2020   Procedure: COLONOSCOPY WITH PROPOFOL;  Surgeon: [name], MD;  Location: [facility] ENDOSCOPY;  Service: Endoscopy;  Laterality: N/A;   CORONARY ANGIOPLASTY     ESOPHAGOGASTRODUODENOSCOPY N/A 04/07/2020   Procedure: ESOPHAGOGASTRODUODENOSCOPY (EGD);  Surgeon: [name], MD;  Location: [facility] ENDOSCOPY;  Service: Endoscopy;  Laterality: N/A;   ESOPHAGOGASTRODUODENOSCOPY (EGD) WITH PROPOFOL N/A 02/09/2018   Procedure: ESOPHAGOGASTRODUODENOSCOPY (EGD) WITH PROPOFOL;  Surgeon: [name], MD;  Location: [facility] ENDOSCOPY;  Service: Endoscopy;  Laterality: N/A;   RIGHT/LEFT HEART CATH AND CORONARY ANGIOGRAPHY N/A 07/18/2017   Procedure: RIGHT/LEFT HEART CATH AND CORONARY ANGIOGRAPHY;  Surgeon: [name], MD;  Location: [facility];  Service: Cardiovascular;  Laterality: N/A;   TEE WITHOUT CARDIOVERSION N/A 05/01/2017   Procedure: TRANSESOPHAGEAL ECHOCARDIOGRAM (TEE);  Surgeon: [name], MD;  Location: [facility];  Service: Cardiovascular;  Laterality: N/A;   TEE WITHOUT CARDIOVERSION N/A 06/16/2017   Procedure: TRANSESOPHAGEAL ECHOCARDIOGRAM (TEE);  Surgeon: [name], MD;  Location[facility] ENDOSCOPY;  Service: Cardiovascular;  Laterality: N/A;    Current Outpatient  Medications  Medication Sig Dispense Refill   acetaminophen (TYLENOL) 500 MG tablet Take 500-1,000 mg by mouth 2 (two) times daily as needed for mild pain or moderate pain.     atorvastatin (LIPITOR) 80 MG tablet Take 1 tablet (80 mg total) by mouth daily. 30 tablet 6   metoprolol succinate (TOPROL-XL) 25 MG 24 hr tablet TAKE 1 TABLET BY MOUTH ONCE DAILY. TAKE WITH OR IMMEDIATELY FOLLOWING A MEAL. 90 tablet 3   sacubitril-valsartan (ENTRESTO) 24-26 MG Take 1 tablet by mouth 2 (two) times daily. 60 tablet 6   spironolactone (ALDACTONE) 25 MG tablet Take 1 tablet (25 mg total) by mouth daily. 30 tablet 6   tiZANidine (ZANAFLEX) 2 MG tablet Take 2 mg by mouth 3 (three) times daily.     No current facility-administered medications for this visit.    Allergies:   Patient has no known allergies.   Social History:  The patient  reports that he quit smoking about 4 years ago. His smoking use included cigarettes. He has a 3.00 pack-year smoking history. He has never used smokeless tobacco. He reports current alcohol use of about 3.0 standard drinks per week. He reports that he does not use drugs.   Family History:   family history includes Alzheimer's disease in his father and mother.    Review of Systems: Review of Systems  Constitutional: Negative.   Respiratory: Negative.    Cardiovascular: Negative.   Gastrointestinal: Negative.   Musculoskeletal: Negative.  Neurological: Negative.   Psychiatric/Behavioral: Negative.    All other systems reviewed and are negative.   PHYSICAL EXAM: VS:  BP 108/70 (BP Location: Left Arm, Patient Position: Sitting, Cuff Size: Normal)   Pulse 66   Ht 6\' 5"  (1.956 m)   Wt 237 lb (107.5 kg)   SpO2 98%   BMI 28.10 kg/m  , BMI Body mass index is 28.1 kg/m.  Constitutional:  oriented to person, place, and time. No distress.  In a wheelchair HENT:  Head: Grossly normal Eyes:  no discharge. No scleral icterus.  Neck: No JVD, no carotid bruits   Cardiovascular: Regular rate and rhythm, no murmurs appreciated Pulmonary/Chest: Clear to auscultation bilaterally, no wheezes or rails Abdominal: Soft.  no distension.  no tenderness.  Musculoskeletal: Normal range of motion Neurological:  normal muscle tone. Coordination normal. No atrophy Skin: Skin warm and dry Psychiatric: normal affect, pleasant  Recent Labs: 08/07/2020: ALT 12; BUN 19; Creatinine, Ser 1.50; Hemoglobin 13.3; Platelets 258; Potassium 4.1; Sodium 137; TSH 2.054    Lipid Panel Lab Results  Component Value Date   CHOL 161 03/25/2019   HDL 79 03/25/2019   LDLCALC 61 03/25/2019   TRIG 103 03/25/2019      Wt Readings from Last 3 Encounters:  08/07/21 237 lb (107.5 kg)  05/11/21 236 lb (107 kg)  04/27/21 236 lb (107 kg)     ASSESSMENT AND PLAN: Preop cardiovascular evaluation Acceptable risk to have teeth extraction No further cardiac testing No medications to hold  Atrial flutter with rapid ventricular response (HCC)  Prior ablation History of stroke,  Continue metoprolol Was not on Eliquis on his last clinic visit refused to restart Refusing again today Long discussion concerning risk and benefit, stroke, again declining anticoagulation He does not want to consider anticoagulation or Watchman device  History of stroke Reports residual left side hand weakness, left leg weakness chronic swelling left leg No new events Walks with a walker, no recent falls  dilated cardiomyopathy /tachycardia mediated Stressed importance of medication compliance Ejection fraction has improved, echocardiogram 2022 Ejection fraction has improved to 55%   Pulmonary HTN ([place]) - Plan: EKG 12-Lead Lasix not on his list Lab work reviewed from primary care Creatinine 1.6 Appears euvolemic  Acute on chronic combined systolic and diastolic CHF (congestive heart failure) ([place]) - Improved EF on echo June 2022  Atrial fibrillation Maintaining normal sinus  rhythm Intolerant of amiodarone Declining anticoagulation   Total encounter time more than 30 minutes  Greater than 50% was spent in counseling and coordination of care with the patient    No orders of the defined types were placed in this encounter.    Signed, Esmond Plants, M.D., Ph.D. 08/07/2021  [place], [place]

## 2021-08-07 ENCOUNTER — Ambulatory Visit (INDEPENDENT_AMBULATORY_CARE_PROVIDER_SITE_OTHER): Payer: Medicare Other | Admitting: Cardiovascular Disease

## 2021-08-07 ENCOUNTER — Encounter: Payer: Self-pay | Admitting: Cardiovascular Disease

## 2021-08-07 VITALS — BP 108/70 | HR 66 | Ht 77.0 in | Wt 237.0 lb

## 2021-08-07 DIAGNOSIS — I480 Paroxysmal atrial fibrillation: Secondary | ICD-10-CM | POA: Diagnosis not present

## 2021-08-07 DIAGNOSIS — I4892 Unspecified atrial flutter: Secondary | ICD-10-CM | POA: Diagnosis not present

## 2021-08-07 DIAGNOSIS — E782 Mixed hyperlipidemia: Secondary | ICD-10-CM | POA: Diagnosis not present

## 2021-08-07 DIAGNOSIS — N183 Chronic kidney disease, stage 3 unspecified: Secondary | ICD-10-CM

## 2021-08-07 DIAGNOSIS — I5022 Chronic systolic (congestive) heart failure: Secondary | ICD-10-CM | POA: Diagnosis not present

## 2021-08-07 DIAGNOSIS — D649 Anemia, unspecified: Secondary | ICD-10-CM | POA: Diagnosis not present

## 2021-08-07 DIAGNOSIS — Z8673 Personal history of transient ischemic attack (TIA), and cerebral infarction without residual deficits: Secondary | ICD-10-CM

## 2021-08-07 DIAGNOSIS — I10 Essential (primary) hypertension: Secondary | ICD-10-CM

## 2021-08-07 DIAGNOSIS — I428 Other cardiomyopathies: Secondary | ICD-10-CM | POA: Diagnosis not present

## 2021-08-07 NOTE — Telephone Encounter (Signed)
Acceptable risk for tooth extraction  No medications to hold  if we can let dentist know  Thx  [place]      Primary Cardiologist: Ida Rogue, MD  Chart reviewed as part of pre-operative protocol coverage. Simple dental extractions are considered low risk procedures per guidelines and generally do not require any specific cardiac clearance. It is also generally accepted that for simple extractions and dental cleanings, there is no need to interrupt blood thinner therapy.   SBE prophylaxis is not required for the patient.  I will route this recommendation to the requesting party via Epic fax function and remove from pre-op pool.  Please call with questions.  Emmaline Life, NP-C    08/07/2021, 11:02 AM [place] [address], [address] Office [phone] Fax [phone]

## 2021-08-07 NOTE — Patient Instructions (Addendum)
Cleared for dental extractions   Medication Instructions:  No changes  If you need a refill on your cardiac medications before your next appointment, please call your pharmacy.   Lab work: No new labs needed  Testing/Procedures: No new testing needed  Follow-Up: At [facility], you and your health needs are our priority.  As part of our continuing mission to provide you with exceptional heart care, we have created designated Provider Care Teams.  These Care Teams include your primary Cardiologist (physician) and Advanced Practice Providers (APPs -  Physician Assistants and Nurse Practitioners) who all work together to provide you with the care you need, when you need it.  You will need a follow up appointment in 6 months  Providers on your designated Care Team:   Murray Hodgkins, NP Christell Faith, PA-C Cadence Kathlen Mody, [place]  COVID-19 Vaccine Information can be found at: [email or web address] For questions related to vaccine distribution or appointments, please email [email or web address] or call [phone].

## 2021-08-15 DIAGNOSIS — S335XXA Sprain of ligaments of lumbar spine, initial encounter: Secondary | ICD-10-CM | POA: Diagnosis not present

## 2021-08-15 DIAGNOSIS — N184 Chronic kidney disease, stage 4 (severe): Secondary | ICD-10-CM | POA: Diagnosis not present

## 2021-08-15 DIAGNOSIS — D649 Anemia, unspecified: Secondary | ICD-10-CM | POA: Diagnosis not present

## 2021-08-15 DIAGNOSIS — I69959 Hemiplegia and hemiparesis following unspecified cerebrovascular disease affecting unspecified side: Secondary | ICD-10-CM | POA: Diagnosis not present

## 2021-08-15 DIAGNOSIS — I6389 Other cerebral infarction: Secondary | ICD-10-CM | POA: Diagnosis not present

## 2021-08-15 DIAGNOSIS — Z0001 Encounter for general adult medical examination with abnormal findings: Secondary | ICD-10-CM | POA: Diagnosis not present

## 2021-08-15 DIAGNOSIS — D509 Iron deficiency anemia, unspecified: Secondary | ICD-10-CM | POA: Diagnosis not present

## 2021-08-15 DIAGNOSIS — I5022 Chronic systolic (congestive) heart failure: Secondary | ICD-10-CM | POA: Diagnosis not present

## 2021-08-15 DIAGNOSIS — I639 Cerebral infarction, unspecified: Secondary | ICD-10-CM | POA: Diagnosis not present

## 2021-10-05 ENCOUNTER — Ambulatory Visit: Payer: Medicare Other | Attending: Family | Admitting: Family

## 2021-10-05 ENCOUNTER — Encounter: Payer: Self-pay | Admitting: Family

## 2021-10-05 VITALS — BP 130/68 | HR 56 | Resp 16 | Ht 77.0 in

## 2021-10-05 DIAGNOSIS — Z8673 Personal history of transient ischemic attack (TIA), and cerebral infarction without residual deficits: Secondary | ICD-10-CM | POA: Insufficient documentation

## 2021-10-05 DIAGNOSIS — I10 Essential (primary) hypertension: Secondary | ICD-10-CM | POA: Diagnosis not present

## 2021-10-05 DIAGNOSIS — I5022 Chronic systolic (congestive) heart failure: Secondary | ICD-10-CM | POA: Diagnosis not present

## 2021-10-05 DIAGNOSIS — I4891 Unspecified atrial fibrillation: Secondary | ICD-10-CM | POA: Insufficient documentation

## 2021-10-05 DIAGNOSIS — Z87891 Personal history of nicotine dependence: Secondary | ICD-10-CM | POA: Diagnosis not present

## 2021-10-05 DIAGNOSIS — Z7901 Long term (current) use of anticoagulants: Secondary | ICD-10-CM | POA: Insufficient documentation

## 2021-10-05 DIAGNOSIS — I130 Hypertensive heart and chronic kidney disease with heart failure and stage 1 through stage 4 chronic kidney disease, or unspecified chronic kidney disease: Secondary | ICD-10-CM | POA: Diagnosis not present

## 2021-10-05 DIAGNOSIS — I480 Paroxysmal atrial fibrillation: Secondary | ICD-10-CM | POA: Diagnosis not present

## 2021-10-05 NOTE — Patient Instructions (Signed)
Continue weighing daily and call for an overnight weight gain of 3 pounds or more or a weekly weight gain of more than 5 pounds.   If you have voicemail, please make sure your mailbox is cleaned out so that we may leave a message and please make sure to listen to any voicemails.     

## 2021-10-05 NOTE — Progress Notes (Signed)
Patient ID: Nathan [name]., male    DOB: 02-18-1951, 71 y.o.   MRN: [id number]  HPI  Nathan [name] is a 71 y/o male with a history of HTN, stroke, previous tobacco use and chronic heart failure.   Echo reports from 08/29/20: Left ventricular ejection fraction, by estimation, is 55 %. The left ventricle has normal function. The left ventricle has no regional wall motion abnormalities. The left ventricular internal cavity size was normal in size. There is mild left ventricular hypertrophy. Left ventricular diastolic parameters are indeterminate. Echo report from 07/01/2018 reviewed and showed an EF of 40-45%. Echo report from 09/04/17 reviewed and showed an EF of 35-40%. Echo report from 06/16/17 reviewed and showed an EF of 15-20% along with mod-severe TR and an elevated PA pressure of 45 mm Hg.  Cardiac catheterization done 07/18/17 and showed heavily calcified coronary arteries with mild nonobstructive disease.  Left dominant system. Right heart catheterization showed moderately to severely elevated filling pressures, severe pulmonary hypertension and severely reduced cardiac output.   Has not been admitted or been in the ED in the last 6 months.    He presents today with a chief complaint of a follow-up visit. He currently has no symptoms and specifically denies any difficulty sleeping, dizziness, abdominal distention, palpitations, pedal edema, chest pain, shortness of breath, cough, fatigue or weight gain.   Says that he's weighing himself daily but does not want to stand and get weighed today in the office. His hoverround has not been working for close to a year. Continues to have the company try and work on it.   Past Medical History:  Diagnosis Date   Atrial flutter ([place])    a. s/p TEE/DCCV 04/2017; b. 06/2017 s/p RFCA; c. CHADS2VASc => 5 (CHF, HTN, age x 1, CVA)-->noncompliant with Xarelto.   Chronic combined systolic and diastolic CHF (congestive heart failure) ([place])    a. TTE 2/19: EF 20-25%,  diffuse HK; b. TTE 3/19: EF 20-25%, diffuse HK; c. 08/2017 Echo: EF 35-40%, diff HK. Gr1 DD; d. 06/2018 Echo: EF 40-45%, DD. Neg bubble study.   CKD (chronic kidney disease), stage II-III    Essential hypertension    GI bleed    a.  Hemorrhagic shock in 11/19 secondary to erosive gastropathy and Barrett's esophagus requiring multiple units PRBCs; b. Cleared to resume OAC->pt did not.   NICM (nonischemic cardiomyopathy) ([place])    a. 04/2017: Echo EF 20-25%; b. TTE 3/19: EF 20-25%; c. 07/2017 Cath: min irregs; d. 08/2017 Echo: EF 35-40%, diff HK; e. 06/2018 Echo: EF 40-45%, DD.   Noncompliance with medications    PAF (paroxysmal atrial fibrillation) ([place])    a. 07/2017 afib->broke w/ IV amio->converted to oral bb due to prior intol to oral amio.   Pulmonary hypertension ([place])    Stroke ([place])    a. 06/2018 MRI/A Brain: R paramedian pons late acute/early subacute infarct, 76mm. No assoc hemorrhage or mass effect.  Sev chronic microvascular isch changtes and mod voluem loss. No large vessel occlusion, aneurysm, or significant stenosis.   Past Surgical History:  Procedure Laterality Date   A-FLUTTER ABLATION N/A 06/16/2017   Procedure: A-FLUTTER ABLATION;  Surgeon: [name], MD;  Location: [facility];  Service: Cardiovascular;  Laterality: N/A;   CARDIAC CATHETERIZATION     CARDIOVERSION N/A 05/01/2017   Procedure: CARDIOVERSION;  Surgeon: [name], MD;  Location: [facility] ORS;  Service: Cardiovascular;  Laterality: N/A;   COLONOSCOPY WITH PROPOFOL N/A 04/07/2020   Procedure:  COLONOSCOPY WITH PROPOFOL;  Surgeon: [name], MD;  Location: [facility] ENDOSCOPY;  Service: Endoscopy;  Laterality: N/A;   CORONARY ANGIOPLASTY     ESOPHAGOGASTRODUODENOSCOPY N/A 04/07/2020   Procedure: ESOPHAGOGASTRODUODENOSCOPY (EGD);  Surgeon: [name], MD;  Location: [facility] ENDOSCOPY;  Service: Endoscopy;  Laterality: N/A;   ESOPHAGOGASTRODUODENOSCOPY (EGD) WITH PROPOFOL N/A 02/09/2018   Procedure:  ESOPHAGOGASTRODUODENOSCOPY (EGD) WITH PROPOFOL;  Surgeon: [name], MD;  Location: [facility] ENDOSCOPY;  Service: Endoscopy;  Laterality: N/A;   RIGHT/LEFT HEART CATH AND CORONARY ANGIOGRAPHY N/A 07/18/2017   Procedure: RIGHT/LEFT HEART CATH AND CORONARY ANGIOGRAPHY;  Surgeon: [name], MD;  Location: [place] [facility];  Service: Cardiovascular;  Laterality: N/A;   TEE WITHOUT CARDIOVERSION N/A 05/01/2017   Procedure: TRANSESOPHAGEAL ECHOCARDIOGRAM (TEE);  Surgeon: [name], MD;  Location: [facility]S;  Service: Cardiovascular;  Laterality: N/A;   TEE WITHOUT CARDIOVERSION N/A 06/16/2017   Procedure: TRANSESOPHAGEAL ECHOCARDIOGRAM (TEE);  Surgeon: [name], MD;  Location: [facility] ENDOSCOPY;  Service: Cardiovascular;  Laterality: N/A;   Family History  Problem Relation Age of Onset   Alzheimer's disease Mother    Alzheimer's disease Father    Social History   Tobacco Use   Smoking status: Former    Packs/day: 1.00    Years: 3.00    Total pack years: 3.00    Types: Cigarettes    Quit date: 04/21/2017    Years since quitting: 4.4   Smokeless tobacco: Never  Substance Use Topics   Alcohol use: Yes    Alcohol/week: 3.0 standard drinks of alcohol    Types: 3 Cans of beer per week    Comment: Drink a half of a 40oz beer every day, drank heavily in the past   No Known Allergies  Prior to Admission medications   Medication Sig Start Date End Date Taking? Authorizing Provider  acetaminophen (TYLENOL) 500 MG tablet Take 500-1,000 mg by mouth 2 (two) times daily as needed for mild pain or moderate pain.   Yes [provider]  atorvastatin (LIPITOR) 80 MG tablet Take 1 tablet (80 mg total) by mouth daily. 04/27/21  Yes Gollan, Kathlene November, MD  metoprolol succinate (TOPROL-XL) 25 MG 24 hr tablet TAKE 1 TABLET BY MOUTH ONCE DAILY. TAKE WITH OR IMMEDIATELY FOLLOWING A MEAL. 04/10/21  Yes Eloise Picone A, FNP  sacubitril-valsartan (ENTRESTO) 24-26 MG Take 1 tablet by  mouth 2 (two) times daily. 04/27/21  Yes [name], MD  spironolactone (ALDACTONE) 25 MG tablet Take 1 tablet (25 mg total) by mouth daily. 04/27/21  Yes Gollan, Kathlene November, MD  tiZANidine (ZANAFLEX) 2 MG tablet Take 2 mg by mouth 3 (three) times daily. 03/21/21  Yes [provider]    Review of Systems  Constitutional:  Negative for appetite change, fatigue and unexpected weight change.  HENT:  Negative for congestion, postnasal drip and sore throat.   Eyes: Negative.   Respiratory:  Negative for cough, chest tightness and shortness of breath.   Cardiovascular:  Negative for chest pain, palpitations and leg swelling.  Gastrointestinal:  Negative for abdominal distention and abdominal pain.  Endocrine: Negative.   Genitourinary: Negative.   Musculoskeletal:  Negative for arthralgias and back pain.  Skin: Negative.   Allergic/Immunologic: Negative.   Neurological:  Negative for dizziness and light-headedness.  Hematological:  Negative for adenopathy. Does not bruise/bleed easily.  Psychiatric/Behavioral:  Negative for dysphoric mood and sleep disturbance (sleeping on 2 pillows). The patient is not nervous/anxious.    Vitals:  10/05/21 0947  BP: 130/68  Pulse: (!) 56  Resp: 16  SpO2: 100%  Height: 6\' 5"  (1.956 m)   Wt Readings from Last 3 Encounters:  08/07/21 237 lb (107.5 kg)  05/11/21 236 lb (107 kg)  04/27/21 236 lb (107 kg)   Lab Results  Component Value Date   CREATININE 1.50 (H) 08/07/2020   CREATININE 1.17 04/08/2020   CREATININE 1.18 04/07/2020   Physical Exam Vitals and nursing note reviewed.  Constitutional:      General: He is not in acute distress.    Appearance: Normal appearance. He is not ill-appearing.  HENT:     Head: Normocephalic and atraumatic.  Cardiovascular:     Rate and Rhythm: Regular rhythm. Bradycardia present.  Pulmonary:     Effort: Pulmonary effort is normal. No respiratory distress.     Breath sounds: No wheezing or rales.   Abdominal:     General: Abdomen is flat. There is no distension.     Palpations: Abdomen is soft.  Musculoskeletal:        General: No tenderness.     Cervical back: Normal range of motion and neck supple.     Right lower leg: No edema.     Left lower leg: No edema.  Skin:    General: Skin is warm and dry.  Neurological:     General: No focal deficit present.     Mental Status: He is alert and oriented to person, place, and time.  Psychiatric:        Mood and Affect: Mood normal.        Behavior: Behavior normal.        Thought Content: Thought content normal.    Assessment & Plan:  1: Chronic heart failure with mildly reduced ejection fraction- - NYHA class I - euvolemic today - weighing daily at home; reviewed the importance of calling for any overnight weight gain of >2 pounds or a weekly weight gain of >5 pounds - he refused to be weighed today - not adding salt to his food and tries to eat low sodium foods. Reminded to closely follow a 2000mg  sodium diet. - on GDMT of metoprolol, entresto and spironolactone - discussed updating echo sometime early next year and see if SGLT2 is needed - BNP on 04/04/20 was 138.8  2: HTN- - BP mildly elevated (130/68) - saw PCP (Tejan-Sie) 04/17/21  - BMP from 08/07/20 reviewed and showed sodium 137, potassium 4.1, creatinine 1.5 and GFR 50  3: Atrial fibrillation- - saw cardiology Rockey Situ) 08/07/21   Medication bottles reviewed.   Return in 6 months, sooner if needed.

## 2021-10-10 ENCOUNTER — Other Ambulatory Visit: Payer: Self-pay | Admitting: Cardiovascular Disease

## 2021-11-07 DIAGNOSIS — M79675 Pain in left toe(s): Secondary | ICD-10-CM | POA: Diagnosis not present

## 2021-11-07 DIAGNOSIS — M79674 Pain in right toe(s): Secondary | ICD-10-CM | POA: Diagnosis not present

## 2021-11-07 DIAGNOSIS — M216X1 Other acquired deformities of right foot: Secondary | ICD-10-CM | POA: Diagnosis not present

## 2021-11-07 DIAGNOSIS — B351 Tinea unguium: Secondary | ICD-10-CM | POA: Diagnosis not present

## 2021-11-07 DIAGNOSIS — M2012 Hallux valgus (acquired), left foot: Secondary | ICD-10-CM | POA: Diagnosis not present

## 2021-11-07 DIAGNOSIS — M86272 Subacute osteomyelitis, left ankle and foot: Secondary | ICD-10-CM | POA: Diagnosis not present

## 2021-11-07 DIAGNOSIS — M2141 Flat foot [pes planus] (acquired), right foot: Secondary | ICD-10-CM | POA: Diagnosis not present

## 2021-11-07 DIAGNOSIS — M2042 Other hammer toe(s) (acquired), left foot: Secondary | ICD-10-CM | POA: Diagnosis not present

## 2021-11-07 DIAGNOSIS — M2041 Other hammer toe(s) (acquired), right foot: Secondary | ICD-10-CM | POA: Diagnosis not present

## 2021-11-07 DIAGNOSIS — M2011 Hallux valgus (acquired), right foot: Secondary | ICD-10-CM | POA: Diagnosis not present

## 2021-11-07 DIAGNOSIS — L97524 Non-pressure chronic ulcer of other part of left foot with necrosis of bone: Secondary | ICD-10-CM | POA: Diagnosis not present

## 2021-11-07 DIAGNOSIS — I739 Peripheral vascular disease, unspecified: Secondary | ICD-10-CM | POA: Diagnosis not present

## 2021-11-08 ENCOUNTER — Other Ambulatory Visit (INDEPENDENT_AMBULATORY_CARE_PROVIDER_SITE_OTHER): Payer: Self-pay | Admitting: Vascular Surgery

## 2021-11-08 DIAGNOSIS — L97524 Non-pressure chronic ulcer of other part of left foot with necrosis of bone: Secondary | ICD-10-CM

## 2021-11-13 ENCOUNTER — Encounter (INDEPENDENT_AMBULATORY_CARE_PROVIDER_SITE_OTHER): Payer: Medicare Other | Admitting: Vascular Surgery

## 2021-11-13 ENCOUNTER — Encounter (INDEPENDENT_AMBULATORY_CARE_PROVIDER_SITE_OTHER): Payer: Self-pay

## 2021-11-22 DIAGNOSIS — L97522 Non-pressure chronic ulcer of other part of left foot with fat layer exposed: Secondary | ICD-10-CM | POA: Diagnosis not present

## 2021-11-22 DIAGNOSIS — M86272 Subacute osteomyelitis, left ankle and foot: Secondary | ICD-10-CM | POA: Diagnosis not present

## 2021-12-14 ENCOUNTER — Other Ambulatory Visit: Payer: Self-pay | Admitting: Cardiovascular Disease

## 2021-12-17 DIAGNOSIS — L97524 Non-pressure chronic ulcer of other part of left foot with necrosis of bone: Secondary | ICD-10-CM | POA: Diagnosis not present

## 2021-12-17 DIAGNOSIS — M86272 Subacute osteomyelitis, left ankle and foot: Secondary | ICD-10-CM | POA: Diagnosis not present

## 2021-12-17 DIAGNOSIS — I739 Peripheral vascular disease, unspecified: Secondary | ICD-10-CM | POA: Diagnosis not present

## 2021-12-20 ENCOUNTER — Ambulatory Visit (INDEPENDENT_AMBULATORY_CARE_PROVIDER_SITE_OTHER): Payer: Medicare Other | Admitting: Nurse Practitioner

## 2021-12-20 ENCOUNTER — Ambulatory Visit (INDEPENDENT_AMBULATORY_CARE_PROVIDER_SITE_OTHER): Payer: Medicare Other

## 2021-12-20 ENCOUNTER — Encounter (INDEPENDENT_AMBULATORY_CARE_PROVIDER_SITE_OTHER): Payer: Self-pay | Admitting: Nurse Practitioner

## 2021-12-20 VITALS — BP 111/73 | HR 67 | Resp 17 | Ht 77.0 in

## 2021-12-20 DIAGNOSIS — I10 Essential (primary) hypertension: Secondary | ICD-10-CM

## 2021-12-20 DIAGNOSIS — L97524 Non-pressure chronic ulcer of other part of left foot with necrosis of bone: Secondary | ICD-10-CM

## 2021-12-20 DIAGNOSIS — E785 Hyperlipidemia, unspecified: Secondary | ICD-10-CM | POA: Diagnosis not present

## 2021-12-20 DIAGNOSIS — Z720 Tobacco use: Secondary | ICD-10-CM | POA: Diagnosis not present

## 2021-12-21 ENCOUNTER — Encounter (INDEPENDENT_AMBULATORY_CARE_PROVIDER_SITE_OTHER): Payer: Self-pay | Admitting: Nurse Practitioner

## 2021-12-21 NOTE — Progress Notes (Signed)
Subjective:    Patient ID: Nathan Glazier., male    DOB: 1951/02/04, 71 y.o.   MRN: [id number] Chief Complaint  Patient presents with   Establish Care    Referred by Dr Luana Shu    Nathan [name] is a 71 year old male that presents today as a referral from Dr. Luana Shu, his podiatrist.  The patient was found to have a callus on the dorsal aspect of the left second toe and once removed revealed an ulceration.  There is purulent drainage expressed.  Podiatry is concerned that this toe will be amputation due to infection probing to bone.  Currently the patient denies any evidence of worsening peripheral arterial disease including claudication or rest pain.  Today noninvasive studies show an ABI 0.95 on the right and 1.68 on the left.  The patient has normal TBI's.  There are biphasic/triphasic waveforms in the bilateral tibial arteries with normal toe waveforms bilaterally.    Review of Systems  Skin:  Positive for wound.  All other systems reviewed and are negative.      Objective:   Physical Exam Vitals reviewed.  HENT:     Head: Normocephalic.  Cardiovascular:     Rate and Rhythm: Normal rate.  Pulmonary:     Effort: Pulmonary effort is normal.  Skin:    General: Skin is warm and dry.  Neurological:     Mental Status: He is alert and oriented to person, place, and time.  Psychiatric:        Mood and Affect: Mood normal.        Behavior: Behavior normal.        Thought Content: Thought content normal.        Judgment: Judgment normal.     BP 111/73 (BP Location: Right Arm)   Pulse 67   Resp 17   Ht 6\' 5"  (1.956 m)   BMI 28.10 kg/m   Past Medical History:  Diagnosis Date   Atrial flutter ([place])    a. s/p TEE/DCCV 04/2017; b. 06/2017 s/p RFCA; c. CHADS2VASc => 5 (CHF, HTN, age x 1, CVA)-->noncompliant with Xarelto.   Chronic combined systolic and diastolic CHF (congestive heart failure) ([place])    a. TTE 2/19: EF 20-25%, diffuse HK; b. TTE 3/19: EF 20-25%, diffuse HK; c. 08/2017  Echo: EF 35-40%, diff HK. Gr1 DD; d. 06/2018 Echo: EF 40-45%, DD. Neg bubble study.   CKD (chronic kidney disease), stage II-III    Essential hypertension    GI bleed    a.  Hemorrhagic shock in 11/19 secondary to erosive gastropathy and Barrett's esophagus requiring multiple units PRBCs; b. Cleared to resume OAC->pt did not.   NICM (nonischemic cardiomyopathy) ([place])    a. 04/2017: Echo EF 20-25%; b. TTE 3/19: EF 20-25%; c. 07/2017 Cath: min irregs; d. 08/2017 Echo: EF 35-40%, diff HK; e. 06/2018 Echo: EF 40-45%, DD.   Noncompliance with medications    PAF (paroxysmal atrial fibrillation) ([place])    a. 07/2017 afib->broke w/ IV amio->converted to oral bb due to prior intol to oral amio.   Pulmonary hypertension ([place])    Stroke ([place])    a. 06/2018 MRI/A Brain: R paramedian pons late acute/early subacute infarct, 61mm. No assoc hemorrhage or mass effect.  Sev chronic microvascular isch changtes and mod voluem loss. No large vessel occlusion, aneurysm, or significant stenosis.    Social History   Socioeconomic History   Marital status: Single    Spouse name: Not on file   Number of children:  1   Years of education: Not on file   Highest education level: Not on file  Occupational History   Occupation: Cook    Comment: [employer]  Tobacco Use   Smoking status: Former    Packs/day: 1.00    Years: 3.00    Total pack years: 3.00    Types: Cigarettes    Quit date: 04/21/2017    Years since quitting: 4.6   Smokeless tobacco: Never  Vaping Use   Vaping Use: Never used  Substance and Sexual Activity   Alcohol use: Yes    Alcohol/week: 3.0 standard drinks of alcohol    Types: 3 Cans of beer per week    Comment: Drink a half of a 40oz beer every day, drank heavily in the past   Drug use: Never   Sexual activity: Yes    Partners: Female  Other Topics Concern   Not on file  Social History Narrative   Not on file   Social Determinants of Health   Financial Resource Strain: Low Risk   (06/15/2017)   Overall Financial Resource Strain (CARDIA)    Difficulty of Paying Living Expenses: Not hard at all  Food Insecurity: No Food Insecurity (10/05/2021)   Hunger Vital Sign    Worried About Running Out of Food in the Last Year: Never true    [place] in the Last Year: Never true  Transportation Needs: Unmet Transportation Needs (10/05/2021)   PRAPARE - Transportation    Lack of Transportation (Medical): Yes    Lack of Transportation (Non-Medical): Yes  Physical Activity: Insufficiently Active (06/15/2017)   Exercise Vital Sign    Days of Exercise per Week: 7 days    Minutes of Exercise per Session: 20 min  Stress: No Stress Concern Present (06/15/2017)   [place]    Feeling of Stress : Not at all  Social Connections: Somewhat Isolated (06/15/2017)   Social Connection and Isolation Panel [NHANES]    Frequency of Communication with Friends and Family: More than three times a week    Frequency of Social Gatherings with Friends and Family: Once a week    Attends Religious Services: 1 to 4 times per year    Active Member of [employer] or Organizations: No    Attends Archivist Meetings: Never    Marital Status: Divorced  Human resources officer Violence: Not At Risk (06/15/2017)   Humiliation, Afraid, Rape, and Kick questionnaire    Fear of Current or Ex-Partner: No    Emotionally Abused: No    Physically Abused: No    Sexually Abused: No    Past Surgical History:  Procedure Laterality Date   A-FLUTTER ABLATION N/A 06/16/2017   Procedure: A-FLUTTER ABLATION;  Surgeon: [name], MD;  Location: [facility];  Service: Cardiovascular;  Laterality: N/A;   CARDIAC CATHETERIZATION     CARDIOVERSION N/A 05/01/2017   Procedure: CARDIOVERSION;  Surgeon: [name], MD;  Location: [facility] ORS;  Service: Cardiovascular;  Laterality: N/A;   COLONOSCOPY WITH PROPOFOL N/A 04/07/2020   Procedure:  COLONOSCOPY WITH PROPOFOL;  Surgeon: [name], MD;  Location: [facility] ENDOSCOPY;  Service: Endoscopy;  Laterality: N/A;   CORONARY ANGIOPLASTY     ESOPHAGOGASTRODUODENOSCOPY N/A 04/07/2020   Procedure: ESOPHAGOGASTRODUODENOSCOPY (EGD);  Surgeon: [name], MD;  Location: [facility] ENDOSCOPY;  Service: Endoscopy;  Laterality: N/A;   ESOPHAGOGASTRODUODENOSCOPY (EGD) WITH PROPOFOL N/A 02/09/2018   Procedure: ESOPHAGOGASTRODUODENOSCOPY (EGD) WITH  PROPOFOL;  Surgeon: [name], MD;  Location: [facility] ENDOSCOPY;  Service: Endoscopy;  Laterality: N/A;   RIGHT/LEFT HEART CATH AND CORONARY ANGIOGRAPHY N/A 07/18/2017   Procedure: RIGHT/LEFT HEART CATH AND CORONARY ANGIOGRAPHY;  Surgeon: [name], MD;  Location: [facility] [facility];  Service: Cardiovascular;  Laterality: N/A;   TEE WITHOUT CARDIOVERSION N/A 05/01/2017   Procedure: TRANSESOPHAGEAL ECHOCARDIOGRAM (TEE);  Surgeon: [name], MD;  Location: [facility] ORS;  Service: Cardiovascular;  Laterality: N/A;   TEE WITHOUT CARDIOVERSION N/A 06/16/2017   Procedure: TRANSESOPHAGEAL ECHOCARDIOGRAM (TEE);  Surgeon: [name], MD;  Location[facility] ENDOSCOPY;  Service: Cardiovascular;  Laterality: N/A;    Family History  Problem Relation Age of Onset   Alzheimer's disease Mother    Alzheimer's disease Father     No Known Allergies     Latest Ref Rng & Units 08/07/2020   11:18 AM 05/03/2020   10:43 AM 04/08/2020    5:03 AM  CBC  WBC 4.0 - 10.5 K/uL 4.6  5.1  7.4   Hemoglobin 13.0 - 17.0 g/dL 13.3  10.2  7.5   Hematocrit 39.0 - 52.0 % 40.0  32.2  23.2   Platelets 150 - 400 K/uL 258  357  385       CMP     Component Value Date/Time   NA 137 08/07/2020 1118   NA 140 11/25/2018 1004   K 4.1 08/07/2020 1118   CL 102 08/07/2020 1118   CO2 23 08/07/2020 1118   GLUCOSE 97 08/07/2020 1118   BUN 19 08/07/2020 1118   BUN 17 11/25/2018 1004   CREATININE 1.50 (H) 08/07/2020 1118   CALCIUM 9.4 08/07/2020 1118   PROT  8.5 (H) 08/07/2020 1118   ALBUMIN 4.0 08/07/2020 1118   AST 16 08/07/2020 1118   ALT 12 08/07/2020 1118   ALKPHOS 63 08/07/2020 1118   BILITOT 0.6 08/07/2020 1118   GFRNONAA 50 (L) 08/07/2020 1118   GFRAA 42 (L) 03/25/2019 1012     VAS [place] ABI WITH/WO TBI  Result Date: 12/20/2021  LOWER EXTREMITY DOPPLER STUDY Patient Name:  Nathan Vineyard.  Date of Exam:   12/20/2021 Medical Rec #: [id number]        Accession #:    [id number] Date of Birth: 1950/09/30        Patient Gender: M Patient Age:   52 years Exam Location:  [facility] Procedure:      VAS [place] ABI WITH/WO TBI Referring Phys: Hortencia Pilar --------------------------------------------------------------------------------  Indications: Ulceration.  Performing Technologist: Almira Coaster RVS  Examination Guidelines: A complete evaluation includes at minimum, Doppler waveform signals and systolic blood pressure reading at the level of bilateral brachial, anterior tibial, and posterior tibial arteries, when vessel segments are accessible. Bilateral testing is considered an integral part of a complete examination. Photoelectric Plethysmograph (PPG) waveforms and toe systolic pressure readings are included as required and additional duplex testing as needed. Limited examinations for reoccurring indications may be performed as noted.  ABI Findings: +---------+------------------+-----+---------+--------+ Right    Rt Pressure (mmHg)IndexWaveform Comment  +---------+------------------+-----+---------+--------+ Brachial 126                                      +---------+------------------+-----+---------+--------+ ATA      122               0.90 biphasic          +---------+------------------+-----+---------+--------+  PTA      128               0.95 triphasic         +---------+------------------+-----+---------+--------+ Great Toe145               1.07 Normal             +---------+------------------+-----+---------+--------+ +---------+------------------+-----+---------+-------+ Left     Lt Pressure (mmHg)IndexWaveform Comment +---------+------------------+-----+---------+-------+ Brachial 135                                     +---------+------------------+-----+---------+-------+ ATA      141               1.04 biphasic         +---------+------------------+-----+---------+-------+ PTA      227               1.68 triphasic        +---------+------------------+-----+---------+-------+ Great Toe140               1.04 Normal           +---------+------------------+-----+---------+-------+ +-------+-----------+-----------+------------+------------+ ABI/TBIToday's ABIToday's TBIPrevious ABIPrevious TBI +-------+-----------+-----------+------------+------------+ Right  .95        1.07                                +-------+-----------+-----------+------------+------------+ Left   1.68       1.04                                +-------+-----------+-----------+------------+------------+  Summary: Right: Resting right ankle-brachial index is within normal range. The right toe-brachial index is normal. Left: Resting left ankle-brachial index is within normal range. The left toe-brachial index is normal. *See table(s) above for measurements and observations.   Electronically signed by Hortencia Pilar MD on 12/20/2021 at 5:04:08 PM.    Final        Assessment & Plan:   1. 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  Patient will continue to follow podiatry for wound treatment.  Patient is advised to follow-up if there are new wounds or ulcerations or development of claudication or rest pain like symptoms.  2. Hyperlipidemia, unspecified hyperlipidemia type Continue statin as ordered and reviewed, no changes at this time    3. Tobacco abuse Smoking cessation was discussed, 3-10 minutes spent on this topic specifically   4. Primary hypertension Continue antihypertensive medications as already ordered, these medications have been reviewed and there are no changes at this time.    Current Outpatient Medications on File Prior to Visit  Medication Sig Dispense Refill   acetaminophen (TYLENOL) 500 MG tablet Take 500-1,000 mg by mouth 2 (two) times daily as needed for mild pain or moderate pain.     atorvastatin (LIPITOR) 80 MG tablet Take 1 tablet by mouth once daily 90 tablet 0   ENTRESTO 24-26 MG Take 1 tablet by mouth twice daily 180 tablet 0   metoprolol succinate (TOPROL-XL) 25 MG 24 hr tablet TAKE 1 TABLET BY MOUTH ONCE DAILY. TAKE WITH OR IMMEDIATELY FOLLOWING A MEAL. 90 tablet 3   spironolactone (ALDACTONE) 25 MG tablet Take 1 tablet (25 mg total) by mouth daily. 30 tablet  6   tiZANidine (ZANAFLEX) 2 MG tablet Take 2 mg by mouth 3 (three) times daily.     No current facility-administered medications on file prior to visit.    There are no Patient Instructions on file for this visit. No follow-ups on file.   Kris Hartmann, NP

## 2022-01-11 DIAGNOSIS — Z0001 Encounter for general adult medical examination with abnormal findings: Secondary | ICD-10-CM | POA: Diagnosis not present

## 2022-01-11 DIAGNOSIS — D509 Iron deficiency anemia, unspecified: Secondary | ICD-10-CM | POA: Diagnosis not present

## 2022-01-11 DIAGNOSIS — D649 Anemia, unspecified: Secondary | ICD-10-CM | POA: Diagnosis not present

## 2022-01-11 DIAGNOSIS — R7301 Impaired fasting glucose: Secondary | ICD-10-CM | POA: Diagnosis not present

## 2022-01-11 DIAGNOSIS — N184 Chronic kidney disease, stage 4 (severe): Secondary | ICD-10-CM | POA: Diagnosis not present

## 2022-01-21 DIAGNOSIS — I6389 Other cerebral infarction: Secondary | ICD-10-CM | POA: Diagnosis not present

## 2022-01-21 DIAGNOSIS — I5022 Chronic systolic (congestive) heart failure: Secondary | ICD-10-CM | POA: Diagnosis not present

## 2022-01-21 DIAGNOSIS — I639 Cerebral infarction, unspecified: Secondary | ICD-10-CM | POA: Diagnosis not present

## 2022-01-21 DIAGNOSIS — N184 Chronic kidney disease, stage 4 (severe): Secondary | ICD-10-CM | POA: Diagnosis not present

## 2022-01-21 DIAGNOSIS — D509 Iron deficiency anemia, unspecified: Secondary | ICD-10-CM | POA: Diagnosis not present

## 2022-01-21 DIAGNOSIS — I69959 Hemiplegia and hemiparesis following unspecified cerebrovascular disease affecting unspecified side: Secondary | ICD-10-CM | POA: Diagnosis not present

## 2022-02-09 NOTE — Progress Notes (Signed)
., Cardiology Office Note  Date:  02/12/2022   ID:  Nathan [name]., DOB 03-Oct-1950, MRN [id number]  PCP:  [name], MD   Chief Complaint  Patient presents with   6 month follow up     "Doing well." Medications reviewed by the patient's bottles.     HPI:  71 y.o. male with history of  CAD tobacco abuse quit 04/28/17  Admission to the hospital 04/2017 with new onset atrial flutter with RVR,  elevated troponin,  acute combined systolic and diastolic CHF Ejection fraction 20-25% February 13th 2019 S/p TEE and cardioversion 05/01/2017 Back into atrial flutter, ejection fraction 20-25% Flutter ablation 06/16/2017 Was in atrial fibrillation on office visit with Dr. [name] July 05 2017 Interval atrial fibrillation and developed a stroke 4/20 ,currently anticoagulated with Eliquis. Echo 06/2018: ef 40 to 45% Echo June 2022 EF 55% Who presents for follow up of his cardiomyopathy, atrial flutter , history of cardioversion  Last seen in clinic by myself February 2023 followed by CHF clinic  In follow-up today reports feeling well No regular exercise program, walks with a walker 1-2 beers a day Has trace leg swelling on the left, none on the right No abdominal fullness, no PND, orthopnea Currently not on a diuretic Denies chest pain concerning for angina, no near-syncope or syncope Not on Lasix  Presents today with his medications, reports compliance  Lab work requested from primary care on today's visit  Walks with a walker, no recent falls  Previous discussions concerning his tachyarrhythmia, history of atrial fibrillation/flutter Previously did not want blood thinners, " I almost bled out" Does not want watchman device  Echo 6/22  1. Left ventricular ejection fraction, by estimation, is 55 %. The left  ventricle has normal function. Unable to exclude hypokinesis of the basal  to mid inferior wall. There is mild left ventricular hypertrophy.   2. Right ventricular  systolic function is normal. The right ventricular  size is normal.   3. The mitral valve is normal in structure. No evidence of mitral valve  regurgitation. No evidence of mitral stenosis.   EKG personally reviewed by myself on todays visit Nsr rate 67 no significant ST-T wave changes P waves noted in V1 and V2  Other past medical history reviewed hospital January 2022 symptomatic anemia, CHF Hemoglobin 7.4  s/p Transfuse 1 unit of blood which is ordered by ED physician. anemia panel, iron saturation 8%,  CVA 06/2018: left side, left hand back 80%, not as strong   cardiac catheterization 07/18/2017 showing nonobstructive disease Heavy calcification markedly elevated left ventricular end-diastolic pressures Insisted on leaving the hospital 07/19/2017  Discharged in normal sinus rhythm Lasix up to 40 twice a day with potassium 20 twice a day   atrial flutter with RVR with 2:1 AV block with heart rates in the 120s bpm Despite advnacing meds He was refusing  metoprolol  -Echo showed EF 20-25% (done while tachycardic), left atrium 54 mm -CHADS2VASC at least least 2 (CHF, age x 1)  converted with ablation  Elevated pressures obtained through echocardiogram, TEE D/c  on Lasix 40 daily   PMH:   has a past medical history of Atrial flutter ([place]), Chronic combined systolic and diastolic CHF (congestive heart failure) ([place]), CKD (chronic kidney disease), stage II-III, Essential hypertension, GI bleed, NICM (nonischemic cardiomyopathy) ([place]), Noncompliance with medications, PAF (paroxysmal atrial fibrillation) ([place]), Pulmonary hypertension ([place]), and Stroke ([place]).  PSH:    Past Surgical History:  Procedure Laterality Date  A-FLUTTER ABLATION N/A 06/16/2017   Procedure: A-FLUTTER ABLATION;  Surgeon: [name], MD;  Location: [facility];  Service: Cardiovascular;  Laterality: N/A;   CARDIAC CATHETERIZATION     CARDIOVERSION N/A 05/01/2017   Procedure: CARDIOVERSION;  Surgeon:  [name], MD;  Location: [facility] ORS;  Service: Cardiovascular;  Laterality: N/A;   COLONOSCOPY WITH PROPOFOL N/A 04/07/2020   Procedure: COLONOSCOPY WITH PROPOFOL;  Surgeon: [name], MD;  Location: [facility] ENDOSCOPY;  Service: Endoscopy;  Laterality: N/A;   CORONARY ANGIOPLASTY     ESOPHAGOGASTRODUODENOSCOPY N/A 04/07/2020   Procedure: ESOPHAGOGASTRODUODENOSCOPY (EGD);  Surgeon: [name], MD;  Location: [facility] ENDOSCOPY;  Service: Endoscopy;  Laterality: N/A;   ESOPHAGOGASTRODUODENOSCOPY (EGD) WITH PROPOFOL N/A 02/09/2018   Procedure: ESOPHAGOGASTRODUODENOSCOPY (EGD) WITH PROPOFOL;  Surgeon: [name], MD;  Location: [facility] ENDOSCOPY;  Service: Endoscopy;  Laterality: N/A;   RIGHT/LEFT HEART CATH AND CORONARY ANGIOGRAPHY N/A 07/18/2017   Procedure: RIGHT/LEFT HEART CATH AND CORONARY ANGIOGRAPHY;  Surgeon: [name], MD;  Location: [place] [facility];  Service: Cardiovascular;  Laterality: N/A;   TEE WITHOUT CARDIOVERSION N/A 05/01/2017   Procedure: TRANSESOPHAGEAL ECHOCARDIOGRAM (TEE);  Surgeon: [name], MD;  Location: [facility] ORS;  Service: Cardiovascular;  Laterality: N/A;   TEE WITHOUT CARDIOVERSION N/A 06/16/2017   Procedure: TRANSESOPHAGEAL ECHOCARDIOGRAM (TEE);  Surgeon: [name], MD;  Location: [facility] ENDOSCOPY;  Service: Cardiovascular;  Laterality: N/A;    Current Outpatient Medications  Medication Sig Dispense Refill   acetaminophen (TYLENOL) 500 MG tablet Take 500-1,000 mg by mouth 2 (two) times daily as needed for mild pain or moderate pain.     atorvastatin (LIPITOR) 80 MG tablet Take 1 tablet by mouth once daily 90 tablet 0   ENTRESTO 24-26 MG Take 1 tablet by mouth twice daily 180 tablet 0   metoprolol succinate (TOPROL-XL) 25 MG 24 hr tablet TAKE 1 TABLET BY MOUTH ONCE DAILY. TAKE WITH OR IMMEDIATELY FOLLOWING A MEAL. 90 tablet 3   spironolactone (ALDACTONE) 25 MG tablet Take 1 tablet (25 mg total) by mouth daily. 30 tablet 6    tiZANidine (ZANAFLEX) 2 MG tablet Take 2 mg by mouth 3 (three) times daily.     No current facility-administered medications for this visit.    Allergies:   Patient has no known allergies.   Social History:  The patient  reports that he quit smoking about 4 years ago. His smoking use included cigarettes. He has a 3.00 pack-year smoking history. He has never used smokeless tobacco. He reports current alcohol use of about 3.0 standard drinks of alcohol per week. He reports that he does not use drugs.   Family History:   family history includes Alzheimer's disease in his father and mother.    Review of Systems: Review of Systems  Constitutional: Negative.   Respiratory: Negative.    Cardiovascular: Negative.   Gastrointestinal: Negative.   Musculoskeletal: Negative.   Neurological: Negative.   Psychiatric/Behavioral: Negative.    All other systems reviewed and are negative.   PHYSICAL EXAM: VS:  BP 128/60 (BP Location: Left Arm, Patient Position: Sitting, Cuff Size: Normal)   Pulse 67   Ht 6\' 5"  (1.956 m)   Wt 238 lb (108 kg)   SpO2 97%   BMI 28.22 kg/m  , BMI Body mass index is 28.22 kg/m.  Constitutional:  oriented to person, place, and time. No distress.  HENT:  Head: Grossly normal Eyes:  no discharge. No scleral icterus.  Neck:  No JVD, no carotid bruits  Cardiovascular: Regular rate and rhythm, no murmurs appreciated Pulmonary/Chest: Clear to auscultation bilaterally, no wheezes or rails Abdominal: Soft.  no distension.  no tenderness.  Musculoskeletal: Normal range of motion Neurological:  normal muscle tone. Coordination normal. No atrophy Skin: Skin warm and dry Psychiatric: normal affect, pleasant   Recent Labs: No results found for requested labs within last 365 days.    Lipid Panel Lab Results  Component Value Date   CHOL 161 03/25/2019   HDL 79 03/25/2019   LDLCALC 61 03/25/2019   TRIG 103 03/25/2019      Wt Readings from Last 3 Encounters:   02/12/22 238 lb (108 kg)  08/07/21 237 lb (107.5 kg)  05/11/21 236 lb (107 kg)     ASSESSMENT AND PLAN:  Atrial flutter with rapid ventricular response (HCC)  Prior ablation History of stroke, previously declined anticoagulation Continue metoprolol We have had prior discussions concerning risk and benefit, stroke Previously declined discussion of and placement of Watchman device  History of stroke Reports residual left side hand weakness, left leg weakness chronic swelling left leg Walks with walker  dilated cardiomyopathy /tachycardia mediated Maintaining normal sinus rhythm, most recent ejection fraction up to normal levels Ejection fraction has improved, echocardiogram 2022 Ejection fraction  55%  Appears euvolemic, not on Lasix  Pulmonary HTN ([place]) - Plan: EKG 12-Lead Lasix not on his list Appears euvolemic, compression hose for left leg swelling  Acute on chronic combined systolic and diastolic CHF (congestive heart failure) (HCC) - Normal EF on echo June 2022 Improved function and normal sinus rhythm  Atrial fibrillation Maintaining normal sinus rhythm Intolerant of amiodarone Declining anticoagulation   Total encounter time more than 30 minutes  Greater than 50% was spent in counseling and coordination of care with the patient    No orders of the defined types were placed in this encounter.    Signed, Esmond Plants, M.D., Ph.D. 02/12/2022  [place], [place]

## 2022-02-11 DIAGNOSIS — R2681 Unsteadiness on feet: Secondary | ICD-10-CM | POA: Diagnosis not present

## 2022-02-11 DIAGNOSIS — I69898 Other sequelae of other cerebrovascular disease: Secondary | ICD-10-CM | POA: Diagnosis not present

## 2022-02-11 DIAGNOSIS — I509 Heart failure, unspecified: Secondary | ICD-10-CM | POA: Diagnosis not present

## 2022-02-12 ENCOUNTER — Other Ambulatory Visit: Payer: Self-pay

## 2022-02-12 ENCOUNTER — Encounter: Payer: Self-pay | Admitting: Cardiovascular Disease

## 2022-02-12 ENCOUNTER — Ambulatory Visit: Payer: Medicare Other | Attending: Cardiovascular Disease | Admitting: Cardiovascular Disease

## 2022-02-12 VITALS — BP 128/60 | HR 67 | Ht 77.0 in | Wt 238.0 lb

## 2022-02-12 DIAGNOSIS — Z8673 Personal history of transient ischemic attack (TIA), and cerebral infarction without residual deficits: Secondary | ICD-10-CM | POA: Diagnosis not present

## 2022-02-12 DIAGNOSIS — I10 Essential (primary) hypertension: Secondary | ICD-10-CM | POA: Diagnosis not present

## 2022-02-12 DIAGNOSIS — E782 Mixed hyperlipidemia: Secondary | ICD-10-CM

## 2022-02-12 DIAGNOSIS — I5022 Chronic systolic (congestive) heart failure: Secondary | ICD-10-CM

## 2022-02-12 DIAGNOSIS — I480 Paroxysmal atrial fibrillation: Secondary | ICD-10-CM

## 2022-02-12 DIAGNOSIS — D649 Anemia, unspecified: Secondary | ICD-10-CM | POA: Diagnosis not present

## 2022-02-12 DIAGNOSIS — N183 Chronic kidney disease, stage 3 unspecified: Secondary | ICD-10-CM | POA: Diagnosis not present

## 2022-02-12 DIAGNOSIS — I428 Other cardiomyopathies: Secondary | ICD-10-CM

## 2022-02-12 DIAGNOSIS — I4892 Unspecified atrial flutter: Secondary | ICD-10-CM | POA: Diagnosis not present

## 2022-02-12 MED ORDER — ENTRESTO 24-26 MG PO TABS: 1.0000 | ORAL_TABLET | Freq: Two times a day (BID) | ORAL | 1 refills | Status: DC

## 2022-02-12 MED ORDER — ATORVASTATIN CALCIUM 80 MG PO TABS: 80.0000 mg | ORAL_TABLET | Freq: Every day | ORAL | 1 refills | Status: DC

## 2022-02-12 MED ORDER — SPIRONOLACTONE 25 MG PO TABS: 25.0000 mg | ORAL_TABLET | Freq: Every day | ORAL | 1 refills | Status: DC

## 2022-02-12 NOTE — Patient Instructions (Addendum)
Labs from PMD, to scan in [facility] system  Medication Instructions:  No changes  If you need a refill on your cardiac medications before your next appointment, please call your pharmacy.   Lab work: No new labs needed  Testing/Procedures: No new testing needed  Follow-Up: At [facility], you and your health needs are our priority.  As part of our continuing mission to provide you with exceptional heart care, we have created designated Provider Care Teams.  These Care Teams include your primary Cardiologist (physician) and Advanced Practice Providers (APPs -  Physician Assistants and Nurse Practitioners) who all work together to provide you with the care you need, when you need it.  You will need a follow up appointment in 6 months  Providers on your designated Care Team:   Murray Hodgkins, NP Christell Faith, PA-C Cadence Kathlen Mody, [place]  COVID-19 Vaccine Information can be found at: [email or web address].uk For questions related to vaccine distribution or appointments, please email [email or web address] or call [phone].

## 2022-02-14 NOTE — Addendum Note (Signed)
Addended by: Michel Santee on: 02/14/2022 04:33 PM   Modules accepted: Orders

## 2022-03-25 NOTE — Progress Notes (Unsigned)
Patient ID: Nathan Witham., male    DOB: 1951/02/18, 72 y.o.   MRN: [id number]  HPI  Nathan [name] is a 72 y/o male with a history of HTN, stroke, previous tobacco use and chronic heart failure.   Echo reports from 08/29/20: Left ventricular ejection fraction, by estimation, is 55 %. The left ventricle has normal function. The left ventricle has no regional wall motion abnormalities. The left ventricular internal cavity size was normal in size. There is mild left ventricular hypertrophy. Left ventricular diastolic parameters are indeterminate. Echo report from 07/01/2018 reviewed and showed an EF of 40-45%. Echo report from 09/04/17 reviewed and showed an EF of 35-40%. Echo report from 06/16/17 reviewed and showed an EF of 15-20% along with mod-severe TR and an elevated PA pressure of 45 mm Hg.  Cardiac catheterization done 07/18/17 and showed heavily calcified coronary arteries with mild nonobstructive disease.  Left dominant system. Right heart catheterization showed moderately to severely elevated filling pressures, severe pulmonary hypertension and severely reduced cardiac output.   Has not been admitted or been in the ED in the last 6 months.    He presents today with a chief complaint of a follow-up visit. Current voices no complaints and specifically denies any difficulty sleeping, dizziness, abdominal distention, palpitations, pedal edema, chest pain, shortness of breath, cough, fatigue or weight gain.   Reports weighing himself daily at home. Does not add salt to his food and likes to use pepper instead.   Past Medical History:  Diagnosis Date   Atrial flutter ([place])    a. s/p TEE/DCCV 04/2017; b. 06/2017 s/p RFCA; c. CHADS2VASc => 5 (CHF, HTN, age x 1, CVA)-->noncompliant with Xarelto.   Chronic combined systolic and diastolic CHF (congestive heart failure) ([place])    a. TTE 2/19: EF 20-25%, diffuse HK; b. TTE 3/19: EF 20-25%, diffuse HK; c. 08/2017 Echo: EF 35-40%, diff HK. Gr1 DD; d. 06/2018 Echo: EF  40-45%, DD. Neg bubble study.   CKD (chronic kidney disease), stage II-III    Essential hypertension    GI bleed    a.  Hemorrhagic shock in 11/19 secondary to erosive gastropathy and Barrett's esophagus requiring multiple units PRBCs; b. Cleared to resume OAC->pt did not.   NICM (nonischemic cardiomyopathy) ([place])    a. 04/2017: Echo EF 20-25%; b. TTE 3/19: EF 20-25%; c. 07/2017 Cath: min irregs; d. 08/2017 Echo: EF 35-40%, diff HK; e. 06/2018 Echo: EF 40-45%, DD.   Noncompliance with medications    PAF (paroxysmal atrial fibrillation) ([place])    a. 07/2017 afib->broke w/ IV amio->converted to oral bb due to prior intol to oral amio.   Pulmonary hypertension ([place])    Stroke ([place])    a. 06/2018 MRI/A Brain: R paramedian pons late acute/early subacute infarct, 57mm. No assoc hemorrhage or mass effect.  Sev chronic microvascular isch changtes and mod voluem loss. No large vessel occlusion, aneurysm, or significant stenosis.   Past Surgical History:  Procedure Laterality Date   A-FLUTTER ABLATION N/A 06/16/2017   Procedure: A-FLUTTER ABLATION;  Surgeon: [name], MD;  Location: [place] [facility];  Service: Cardiovascular;  Laterality: N/A;   CARDIAC CATHETERIZATION     CARDIOVERSION N/A 05/01/2017   Procedure: CARDIOVERSION;  Surgeon: [name], MD;  Location: [facility] ORS;  Service: Cardiovascular;  Laterality: N/A;   COLONOSCOPY WITH PROPOFOL N/A 04/07/2020   Procedure: COLONOSCOPY WITH PROPOFOL;  Surgeon: [name], MD;  Location: [facility] ENDOSCOPY;  Service: Endoscopy;  Laterality: N/A;   CORONARY  ANGIOPLASTY     ESOPHAGOGASTRODUODENOSCOPY N/A 04/07/2020   Procedure: ESOPHAGOGASTRODUODENOSCOPY (EGD);  Surgeon: [name], MD;  Location: [facility];  Service: Endoscopy;  Laterality: N/A;   ESOPHAGOGASTRODUODENOSCOPY (EGD) WITH PROPOFOL N/A 02/09/2018   Procedure: ESOPHAGOGASTRODUODENOSCOPY (EGD) WITH PROPOFOL;  Surgeon: [name], MD;  Location: [facility] ENDOSCOPY;   Service: Endoscopy;  Laterality: N/A;   RIGHT/LEFT HEART CATH AND CORONARY ANGIOGRAPHY N/A 07/18/2017   Procedure: RIGHT/LEFT HEART CATH AND CORONARY ANGIOGRAPHY;  Surgeon: [name], MD;  Location: [place] [facility];  Service: Cardiovascular;  Laterality: N/A;   TEE WITHOUT CARDIOVERSION N/A 05/01/2017   Procedure: TRANSESOPHAGEAL ECHOCARDIOGRAM (TEE);  Surgeon: [name], MD;  Location: [facility] ORS;  Service: Cardiovascular;  Laterality: N/A;   TEE WITHOUT CARDIOVERSION N/A 06/16/2017   Procedure: TRANSESOPHAGEAL ECHOCARDIOGRAM (TEE);  Surgeon: [name], MD;  Location: [facility] ENDOSCOPY;  Service: Cardiovascular;  Laterality: N/A;   Family History  Problem Relation Age of Onset   Alzheimer's disease Mother    Alzheimer's disease Father    Social History   Tobacco Use   Smoking status: Former    Packs/day: 1.00    Years: 3.00    Total pack years: 3.00    Types: Cigarettes    Quit date: 04/21/2017    Years since quitting: 4.9   Smokeless tobacco: Never  Substance Use Topics   Alcohol use: Yes    Alcohol/week: 3.0 standard drinks of alcohol    Types: 3 Cans of beer per week    Comment: Drink a half of a 40oz beer every day, drank heavily in the past   No Known Allergies  Prior to Admission medications   Medication Sig Start Date End Date Taking? Authorizing Provider  acetaminophen (TYLENOL) 500 MG tablet Take 500-1,000 mg by mouth 2 (two) times daily as needed for mild pain or moderate pain.   Yes [provider]  atorvastatin (LIPITOR) 80 MG tablet Take 1 tablet (80 mg total) by mouth daily. 02/12/22  Yes Gollan, Kathlene November, MD  metoprolol succinate (TOPROL-XL) 25 MG 24 hr tablet TAKE 1 TABLET BY MOUTH ONCE DAILY. TAKE WITH OR IMMEDIATELY FOLLOWING A MEAL. 04/10/21  Yes Eagle Pitta A, FNP  sacubitril-valsartan (ENTRESTO) 24-26 MG Take 1 tablet by mouth 2 (two) times daily. 02/12/22  Yes [name], MD  spironolactone (ALDACTONE) 25 MG tablet Take 1  tablet (25 mg total) by mouth daily. 02/12/22  Yes Gollan, Kathlene November, MD  tiZANidine (ZANAFLEX) 2 MG tablet Take 2 mg by mouth 3 (three) times daily. 03/21/21  Yes [provider]   Review of Systems  Constitutional:  Negative for appetite change, fatigue and unexpected weight change.  HENT:  Negative for congestion, postnasal drip and sore throat.   Eyes: Negative.   Respiratory:  Negative for cough, chest tightness and shortness of breath.   Cardiovascular:  Negative for chest pain, palpitations and leg swelling.  Gastrointestinal:  Negative for abdominal distention and abdominal pain.  Endocrine: Negative.   Genitourinary: Negative.   Musculoskeletal:  Negative for arthralgias and back pain.  Skin: Negative.   Allergic/Immunologic: Negative.   Neurological:  Negative for dizziness and light-headedness.  Hematological:  Negative for adenopathy. Does not bruise/bleed easily.  Psychiatric/Behavioral:  Negative for dysphoric mood and sleep disturbance (sleeping on 2 pillows). The patient is not nervous/anxious.    Vitals:   03/26/22 0945  BP: 138/73  Pulse: 66  Resp: 16  SpO2: 99%  Weight: 238 lb (108 kg)  Wt Readings from Last 3 Encounters:  03/26/22 238 lb (108 kg)  02/12/22 238 lb (108 kg)  08/07/21 237 lb (107.5 kg)   Lab Results  Component Value Date   CREATININE 1.50 (H) 08/07/2020   CREATININE 1.17 04/08/2020   CREATININE 1.18 04/07/2020   Physical Exam Vitals and nursing note reviewed.  Constitutional:      General: He is not in acute distress.    Appearance: Normal appearance. He is not ill-appearing.  HENT:     Head: Normocephalic and atraumatic.  Cardiovascular:     Rate and Rhythm: Normal rate and regular rhythm.  Pulmonary:     Effort: Pulmonary effort is normal. No respiratory distress.     Breath sounds: No wheezing or rales.  Abdominal:     General: Abdomen is flat. There is no distension.     Palpations: Abdomen is soft.  Musculoskeletal:         General: No tenderness.     Cervical back: Normal range of motion and neck supple.     Right lower leg: No edema.     Left lower leg: No edema.  Skin:    General: Skin is warm and dry.  Neurological:     General: No focal deficit present.     Mental Status: He is alert and oriented to person, place, and time.  Psychiatric:        Mood and Affect: Mood normal.        Behavior: Behavior normal.        Thought Content: Thought content normal.    Assessment & Plan:  1: Chronic heart failure with mildly reduced ejection fraction- - NYHA class I - euvolemic today - weighing daily at home; reviewed the importance of calling for any overnight weight gain of >2 pounds or a weekly weight gain of >5 pounds - weight 238 pounds today (refused to be weighed last time) - not adding salt to his food and tries to eat low sodium foods. Reminded to closely follow a 2000mg  sodium diet; using pepper for seasoning - on GDMT of metoprolol, entresto and spironolactone - discussed updating echo this year; will discuss further at next visit - BNP on 04/04/20 was 138.8 - PharmD reconciled medications - lipid panel today  2: HTN- - BP 138/73 - to see PCP (Tejan-Sie) 04/23/22 - BMP from 08/07/20 reviewed and showed sodium 137, potassium 4.1, creatinine 1.5 and GFR 50 - BMP today; will fax results to PCP  3: Atrial fibrillation- - saw cardiology Rockey Situ) 02/12/22   Medication bottles reviewed.   Return in 6 months, sooner if needed.

## 2022-03-26 ENCOUNTER — Ambulatory Visit (HOSPITAL_BASED_OUTPATIENT_CLINIC_OR_DEPARTMENT_OTHER): Payer: Medicare Other | Admitting: Family

## 2022-03-26 ENCOUNTER — Encounter: Payer: Self-pay | Admitting: Pharmacist

## 2022-03-26 ENCOUNTER — Other Ambulatory Visit
Admission: RE | Admit: 2022-03-26 | Discharge: 2022-03-26 | Disposition: A | Discharge status: Home / Self Care | Payer: Medicare Other | Source: Ambulatory Visit | Attending: Family | Admitting: Family

## 2022-03-26 ENCOUNTER — Other Ambulatory Visit (HOSPITAL_COMMUNITY): Payer: Self-pay

## 2022-03-26 ENCOUNTER — Encounter: Payer: Self-pay | Admitting: Family

## 2022-03-26 VITALS — BP 138/73 | HR 66 | Resp 16 | Wt 238.0 lb

## 2022-03-26 DIAGNOSIS — I5022 Chronic systolic (congestive) heart failure: Secondary | ICD-10-CM | POA: Insufficient documentation

## 2022-03-26 DIAGNOSIS — I10 Essential (primary) hypertension: Secondary | ICD-10-CM | POA: Diagnosis not present

## 2022-03-26 DIAGNOSIS — I130 Hypertensive heart and chronic kidney disease with heart failure and stage 1 through stage 4 chronic kidney disease, or unspecified chronic kidney disease: Secondary | ICD-10-CM | POA: Insufficient documentation

## 2022-03-26 DIAGNOSIS — Z8673 Personal history of transient ischemic attack (TIA), and cerebral infarction without residual deficits: Secondary | ICD-10-CM | POA: Insufficient documentation

## 2022-03-26 DIAGNOSIS — I480 Paroxysmal atrial fibrillation: Secondary | ICD-10-CM | POA: Insufficient documentation

## 2022-03-26 DIAGNOSIS — N189 Chronic kidney disease, unspecified: Secondary | ICD-10-CM | POA: Insufficient documentation

## 2022-03-26 DIAGNOSIS — I272 Pulmonary hypertension, unspecified: Secondary | ICD-10-CM | POA: Insufficient documentation

## 2022-03-26 DIAGNOSIS — Z87891 Personal history of nicotine dependence: Secondary | ICD-10-CM | POA: Insufficient documentation

## 2022-03-26 LAB — BASIC METABOLIC PANEL
Anion gap: 8 (ref 5–15)
BUN: 19 mg/dL (ref 8–23)
CO2: 24 mmol/L (ref 22–32)
Calcium: 9.2 mg/dL (ref 8.9–10.3)
Chloride: 105 mmol/L (ref 98–111)
Creatinine, Ser: 1.21 mg/dL (ref 0.61–1.24)
GFR, Estimated: >60 mL/min (ref >60)
Glucose, Bld: 107 mg/dL — ABNORMAL HIGH (ref 70–99)
Potassium: 3.8 mmol/L (ref 3.5–5.1)
Sodium: 137 mmol/L (ref 135–145)

## 2022-03-26 LAB — LIPID PANEL
Cholesterol: 168 mg/dL (ref 0–200)
HDL: 89 mg/dL (ref >40)
LDL Cholesterol: 66 mg/dL (ref 0–99)
Total CHOL/HDL Ratio: 1.9 RATIO
Triglycerides: 66 mg/dL (ref <150)
VLDL: 13 mg/dL (ref 0–40)

## 2022-03-26 NOTE — Patient Instructions (Signed)
Continue weighing daily and call for an overnight weight gain of 3 pounds or more or a weekly weight gain of more than 5 pounds.   If you have voicemail, please make sure your mailbox is cleaned out so that we may leave a message and please make sure to listen to any voicemails.     

## 2022-03-27 NOTE — Progress Notes (Signed)
Patient ID: Nathan [name]., male   DOB: 09/01/1950, 72 y.o.   MRN: [id number] [place] - PHARMACIST COUNSELING NOTE  *HFimpEF*  Guideline-Directed Medical Therapy/Evidence Based Medicine  ACE/ARB/ARNI: Sacubitril-valsartan 24-26 mg twice daily Beta Blocker: Metoprolol succinate 25 mg daily Aldosterone Antagonist: Spironolactone 25 mg daily Diuretic:  none SGLT2i:  none  Adherence Assessment  Do you ever forget to take your medication? [] Yes [x] No  Do you ever skip doses due to side effects? [] Yes [x] No  Do you have trouble affording your medicines? [] Yes [x] No  Are you ever unable to pick up your medication due to transportation difficulties? [] Yes [x] No  Do you ever stop taking your medications because you don't believe they are helping? [] Yes [x] No  Do you check your weight daily? [x] Yes [] No   Adherence strategy: none  Barriers to obtaining medications: none  Vital signs: HR 66, BP 138/73, weight (pounds) 238 lbs (stable) ECHO: Echo reports from 08/29/20: Left ventricular ejection fraction, by estimation, is 55 %. The left ventricle has normal function. The left ventricle has no regional wall motion abnormalities. The left ventricular internal cavity size was normal in size. There is mild left ventricular hypertrophy. Left ventricular diastolic parameters are indeterminate. Echo report from 07/01/2018 reviewed and showed an EF of 40-45%. Echo report from 09/04/17 reviewed and showed an EF of 35-40%. Echo report from 06/16/17 reviewed and showed an EF of 15-20% along with mod-severe TR and an elevated PA pressure of 45 mm Hg.      Latest Ref Rng & Units 03/26/2022   10:58 AM 08/07/2020   11:18 AM 04/08/2020    5:03 AM  BMP  Glucose 70 - 99 mg/dL 107  97  100   BUN 8 - 23 mg/dL 19  19  13    Creatinine 0.61 - 1.24 mg/dL 1.21  1.50  1.17   Sodium 135 - 145 mmol/L 137  137  138   Potassium 3.5 - 5.1 mmol/L 3.8  4.1  4.2   Chloride 98 -  111 mmol/L 105  102  104   CO2 22 - 32 mmol/L 24  23  26    Calcium 8.9 - 10.3 mg/dL 9.2  9.4  8.9     Past Medical History:  Diagnosis Date   Atrial flutter ([place])    a. s/p TEE/DCCV 04/2017; b. 06/2017 s/p RFCA; c. CHADS2VASc => 5 (CHF, HTN, age x 1, CVA)-->noncompliant with Xarelto.   Chronic combined systolic and diastolic CHF (congestive heart failure) ([place])    a. TTE 2/19: EF 20-25%, diffuse HK; b. TTE 3/19: EF 20-25%, diffuse HK; c. 08/2017 Echo: EF 35-40%, diff HK. Gr1 DD; d. 06/2018 Echo: EF 40-45%, DD. Neg bubble study.   CKD (chronic kidney disease), stage II-III    Essential hypertension    GI bleed    a.  Hemorrhagic shock in 11/19 secondary to erosive gastropathy and Barrett's esophagus requiring multiple units PRBCs; b. Cleared to resume OAC->pt did not.   NICM (nonischemic cardiomyopathy) ([place])    a. 04/2017: Echo EF 20-25%; b. TTE 3/19: EF 20-25%; c. 07/2017 Cath: min irregs; d. 08/2017 Echo: EF 35-40%, diff HK; e. 06/2018 Echo: EF 40-45%, DD.   Noncompliance with medications    PAF (paroxysmal atrial fibrillation) ([place])    a. 07/2017 afib->broke w/ IV amio->converted to oral bb due to prior intol to oral amio.   Pulmonary hypertension ([place])    Stroke [facility])    a. 06/2018 MRI/A Brain:  R paramedian pons late acute/early subacute infarct, 28mm. No assoc hemorrhage or mass effect.  Sev chronic microvascular isch changtes and mod voluem loss. No large vessel occlusion, aneurysm, or significant stenosis.    ASSESSMENT 72 year old male who presents to the HF clinic for follow up. PMH includes HTN, stroke, previous tobacco use and chronic heart failure.  Last ED visit was > 6 months ago.  Noted patient preference is to avoid adding more medication. Somehow reluctant to get frequent blood work, and refuses ALL anticoagulation.  PLAN Repeat BMET today Repeat lipid panel (last dose 36yrs ago) - on atorvastatin Add SGLT2i if patient agreeable  Time spent: 20 minutes  Karman Veney  Rodriguez-Guzman PharmD, BCPS 03/27/2022 1:52 PM   Current Outpatient Medications:    acetaminophen (TYLENOL) 500 MG tablet, Take 500-1,000 mg by mouth 2 (two) times daily as needed for mild pain or moderate pain., Disp: , Rfl:    atorvastatin (LIPITOR) 80 MG tablet, Take 1 tablet (80 mg total) by mouth daily., Disp: 90 tablet, Rfl: 1   metoprolol succinate (TOPROL-XL) 25 MG 24 hr tablet, TAKE 1 TABLET BY MOUTH ONCE DAILY. TAKE WITH OR IMMEDIATELY FOLLOWING A MEAL., Disp: 90 tablet, Rfl: 3   sacubitril-valsartan (ENTRESTO) 24-26 MG, Take 1 tablet by mouth 2 (two) times daily., Disp: 180 tablet, Rfl: 1   spironolactone (ALDACTONE) 25 MG tablet, Take 1 tablet (25 mg total) by mouth daily., Disp: 90 tablet, Rfl: 1   tiZANidine (ZANAFLEX) 2 MG tablet, Take 2 mg by mouth 3 (three) times daily., Disp: , Rfl:     MEDICATION ADHERENCES TIPS AND STRATEGIES Taking medication as prescribed improves patient outcomes in heart failure (reduces hospitalizations, improves symptoms, increases survival) Side effects of medications can be managed by decreasing doses, switching agents, stopping drugs, or adding additional therapy. Please let someone in the [facility] know if you have having bothersome side effects so we can modify your regimen. Do not alter your medication regimen without talking to [place].  Medication reminders can help patients remember to take drugs on time. If you are missing or forgetting doses you can try linking behaviors, using pill boxes, or an electronic reminder like an alarm on your phone or an app. Some people can also get automated phone calls as medication reminders.

## 2022-03-29 ENCOUNTER — Encounter: Payer: Self-pay | Admitting: Internal Medicine

## 2022-04-16 ENCOUNTER — Other Ambulatory Visit: Payer: Self-pay | Admitting: Internal Medicine

## 2022-04-16 DIAGNOSIS — I639 Cerebral infarction, unspecified: Secondary | ICD-10-CM | POA: Diagnosis not present

## 2022-04-16 DIAGNOSIS — D509 Iron deficiency anemia, unspecified: Secondary | ICD-10-CM | POA: Diagnosis not present

## 2022-04-17 LAB — COMPREHENSIVE METABOLIC PANEL
ALT: 9 IU/L (ref 0–44)
AST: 16 IU/L (ref 0–40)
Albumin/Globulin Ratio: 1 — ABNORMAL LOW (ref 1.2–2.2)
Albumin: 4 g/dL (ref 3.8–4.8)
Alkaline Phosphatase: 58 IU/L (ref 44–121)
BUN/Creatinine Ratio: 8 — ABNORMAL LOW (ref 10–24)
BUN: 10 mg/dL (ref 8–27)
Bilirubin Total: 0.5 mg/dL (ref 0.0–1.2)
CO2: 20 mmol/L (ref 20–29)
Calcium: 10.3 mg/dL — ABNORMAL HIGH (ref 8.6–10.2)
Chloride: 103 mmol/L (ref 96–106)
Creatinine, Ser: 1.32 mg/dL — ABNORMAL HIGH (ref 0.76–1.27)
Globulin, Total: 3.9 g/dL (ref 1.5–4.5)
Glucose: 77 mg/dL (ref 70–99)
Potassium: 4.8 mmol/L (ref 3.5–5.2)
Sodium: 140 mmol/L (ref 134–144)
Total Protein: 7.9 g/dL (ref 6.0–8.5)
eGFR: 58 mL/min/{1.73_m2} — ABNORMAL LOW (ref >59)

## 2022-04-17 LAB — CBC WITH DIFFERENTIAL/PLATELET
Basophils Absolute: 0 10*3/uL (ref 0.0–0.2)
Basos: 1 %
EOS (ABSOLUTE): 0.1 10*3/uL (ref 0.0–0.4)
Eos: 3 %
Hematocrit: 40 % (ref 37.5–51.0)
Hemoglobin: 13.4 g/dL (ref 13.0–17.7)
Immature Grans (Abs): 0 10*3/uL (ref 0.0–0.1)
Immature Granulocytes: 0 %
Lymphocytes Absolute: 1.3 10*3/uL (ref 0.7–3.1)
Lymphs: 26 %
MCH: 29.2 pg (ref 26.6–33.0)
MCHC: 33.5 g/dL (ref 31.5–35.7)
MCV: 87 fL (ref 79–97)
Monocytes Absolute: 0.6 10*3/uL (ref 0.1–0.9)
Monocytes: 11 %
Neutrophils Absolute: 3 10*3/uL (ref 1.4–7.0)
Neutrophils: 59 %
Platelets: 246 10*3/uL (ref 150–450)
RBC: 4.59 x10E6/uL (ref 4.14–5.80)
RDW: 13.8 % (ref 11.6–15.4)
WBC: 5.1 10*3/uL (ref 3.4–10.8)

## 2022-04-17 LAB — LIPID PANEL W/O CHOL/HDL RATIO
Cholesterol, Total: 147 mg/dL (ref 100–199)
HDL: 77 mg/dL (ref >39)
LDL Chol Calc (NIH): 56 mg/dL (ref 0–99)
Triglycerides: 74 mg/dL (ref 0–149)
VLDL Cholesterol Cal: 14 mg/dL (ref 5–40)

## 2022-04-23 ENCOUNTER — Ambulatory Visit (INDEPENDENT_AMBULATORY_CARE_PROVIDER_SITE_OTHER): Payer: 59 | Admitting: Internal Medicine

## 2022-04-23 ENCOUNTER — Encounter: Payer: Self-pay | Admitting: Internal Medicine

## 2022-04-23 VITALS — BP 120/78 | HR 78 | Ht 77.0 in | Wt 238.0 lb

## 2022-04-23 DIAGNOSIS — I639 Cerebral infarction, unspecified: Secondary | ICD-10-CM | POA: Diagnosis not present

## 2022-04-23 DIAGNOSIS — F17210 Nicotine dependence, cigarettes, uncomplicated: Secondary | ICD-10-CM

## 2022-04-23 DIAGNOSIS — I5022 Chronic systolic (congestive) heart failure: Secondary | ICD-10-CM

## 2022-04-23 DIAGNOSIS — I10 Essential (primary) hypertension: Secondary | ICD-10-CM

## 2022-04-23 DIAGNOSIS — N1831 Chronic kidney disease, stage 3a: Secondary | ICD-10-CM

## 2022-04-23 DIAGNOSIS — Z720 Tobacco use: Secondary | ICD-10-CM

## 2022-04-23 NOTE — Progress Notes (Signed)
Established Patient Office Visit  Subjective   Patient ID: Nathan [name]., male    DOB: 1950/05/05  Age: 72 y.o. MRN: [id number]  No chief complaint on file.  HPI No new complaints, here for lab review and medication refills. Labs reviewed and notable for well controlled cholesterol but deterioration in renal function.   Past Medical History:  Diagnosis Date   Atrial flutter ([place])    a. Orla Jolliff/p TEE/DCCV 04/2017; b. 06/2017 [place]/p RFCA; c. CHADS2VASc => 5 (CHF, HTN, age x 1, CVA)-->noncompliant with Xarelto.   Chronic combined systolic and diastolic CHF (congestive heart failure) ([place])    a. TTE 2/19: EF 20-25%, diffuse HK; b. TTE 3/19: EF 20-25%, diffuse HK; c. 08/2017 Echo: EF 35-40%, diff HK. Gr1 DD; d. 06/2018 Echo: EF 40-45%, DD. Neg bubble study.   CKD (chronic kidney disease), stage II-III    Essential hypertension    GI bleed    a.  Hemorrhagic shock in 11/19 secondary to erosive gastropathy and Barrett'Labrea [place] esophagus requiring multiple units PRBCs; b. Cleared to resume OAC->pt did not.   NICM (nonischemic cardiomyopathy) ([place])    a. 04/2017: Echo EF 20-25%; b. TTE 3/19: EF 20-25%; c. 07/2017 Cath: min irregs; d. 08/2017 Echo: EF 35-40%, diff HK; e. 06/2018 Echo: EF 40-45%, DD.   Noncompliance with medications    PAF (paroxysmal atrial fibrillation) ([place])    a. 07/2017 afib->broke w/ IV amio->converted to oral bb due to prior intol to oral amio.   Pulmonary hypertension ([place])    Stroke ([place])    a. 06/2018 MRI/A Brain: R paramedian pons late acute/early subacute infarct, 18mm. No assoc hemorrhage or mass effect.  Sev chronic microvascular isch changtes and mod voluem loss. No large vessel occlusion, aneurysm, or significant stenosis.      Review of Systems  Constitutional: Negative.   HENT: Negative.    Eyes: Negative.   Respiratory: Negative.    Cardiovascular:  Negative for chest pain, palpitations and leg swelling.  Gastrointestinal: Negative.   Genitourinary: Negative.   Skin:  Negative.   Neurological: Negative.   Endo/Heme/Allergies: Negative.   Psychiatric/Behavioral: Negative.        Objective:     BP 120/78 (BP Location: Right Arm, Patient Position: Sitting, Cuff Size: Normal)   Pulse 78   Ht 6\' 5"  (1.956 m)   Wt 238 lb (108 kg)   SpO2 98%   BMI 28.22 kg/m  BP Readings from Last 3 Encounters:  04/23/22 120/78  03/26/22 138/73  02/12/22 128/60   Wt Readings from Last 3 Encounters:  04/23/22 238 lb (108 kg)  03/26/22 238 lb (108 kg)  02/12/22 238 lb (108 kg)      Physical Exam Constitutional:      Appearance: Normal appearance. He is not ill-appearing.  HENT:     Head: Normocephalic and atraumatic.     Nose: No congestion.     Mouth/Throat:     Mouth: Mucous membranes are moist.  Eyes:     Extraocular Movements: Extraocular movements intact.     Pupils: Pupils are equal, round, and reactive to light.  Cardiovascular:     Rate and Rhythm: Regular rhythm. Bradycardia present.     Heart sounds: Normal heart sounds. No murmur heard. Pulmonary:     Breath sounds: Normal breath sounds. No rhonchi or rales.  Abdominal:     General: Abdomen is flat. Bowel sounds are normal.  Musculoskeletal:        General: Normal range of motion.  Cervical back: Normal range of motion.  Skin:    General: Skin is warm and dry.  Neurological:     Mental Status: He is alert.      No results found for any visits on 04/23/22.  Last lipids Lab Results  Component Value Date   CHOL 147 04/16/2022   HDL 77 04/16/2022   LDLCALC 56 04/16/2022   TRIG 74 04/16/2022   CHOLHDL 1.9 03/26/2022      The ASCVD Risk score (Arnett DK, et al., 2019) failed to calculate for the following reasons:   The patient has a prior MI or stroke diagnosis    Assessment & Plan:   Problem List Items Addressed This Visit   None Encourage increased fluid intake.   No follow-ups on file.    Volanda Napoleon, MD

## 2022-05-14 ENCOUNTER — Other Ambulatory Visit: Payer: Self-pay | Admitting: Internal Medicine

## 2022-05-14 ENCOUNTER — Other Ambulatory Visit: Payer: Self-pay | Admitting: Family

## 2022-05-29 ENCOUNTER — Encounter: Payer: Self-pay | Admitting: Internal Medicine

## 2022-05-29 ENCOUNTER — Ambulatory Visit (INDEPENDENT_AMBULATORY_CARE_PROVIDER_SITE_OTHER): Payer: 59 | Admitting: Internal Medicine

## 2022-05-29 VITALS — BP 110/76 | HR 83 | Ht 77.0 in | Wt 238.0 lb

## 2022-05-29 DIAGNOSIS — I639 Cerebral infarction, unspecified: Secondary | ICD-10-CM | POA: Diagnosis not present

## 2022-05-29 NOTE — Progress Notes (Signed)
Established Patient Office Visit  Subjective:  Patient ID: Nathan [name]., male    DOB: March 09, 1951  Age: 72 y.o. MRN: [id number]  Chief Complaint  Patient presents with   Follow-up    Mobility evaluation    No new complaints, here for mobility exam to replace his old power mobility device. H/o stroke with left sided weakness which makes him unable to use a manual wheelchair. Requires mobility device to travel to the kitchen and bathrooom and perform ADLS.      Past Medical History:  Diagnosis Date   Atrial flutter ([place])    a. Tirth Cothron/p TEE/DCCV 04/2017; b. 06/2017 Ambrose Wile/p RFCA; c. CHADS2VASc => 5 (CHF, HTN, age x 1, CVA)-->noncompliant with Xarelto.   Chronic combined systolic and diastolic CHF (congestive heart failure) ([place])    a. TTE 2/19: EF 20-25%, diffuse HK; b. TTE 3/19: EF 20-25%, diffuse HK; c. 08/2017 Echo: EF 35-40%, diff HK. Gr1 DD; d. 06/2018 Echo: EF 40-45%, DD. Neg bubble study.   CKD (chronic kidney disease), stage II-III    Essential hypertension    GI bleed    a.  Hemorrhagic shock in 11/19 secondary to erosive gastropathy and Barrett'Coty Student esophagus requiring multiple units PRBCs; b. Cleared to resume OAC->pt did not.   NICM (nonischemic cardiomyopathy) ([place])    a. 04/2017: Echo EF 20-25%; b. TTE 3/19: EF 20-25%; c. 07/2017 Cath: min irregs; d. 08/2017 Echo: EF 35-40%, diff HK; e. 06/2018 Echo: EF 40-45%, DD.   Noncompliance with medications    PAF (paroxysmal atrial fibrillation) ([place])    a. 07/2017 afib->broke w/ IV amio->converted to oral bb due to prior intol to oral amio.   Pulmonary hypertension ([place])    Stroke ([place])    a. 06/2018 MRI/A Brain: R paramedian pons late acute/early subacute infarct, 72m. No assoc hemorrhage or mass effect.  Sev chronic microvascular isch changtes and mod voluem loss. No large vessel occlusion, aneurysm, or significant stenosis.    Past Surgical History:  Procedure Laterality Date   A-FLUTTER ABLATION N/A 06/16/2017   Procedure: A-FLUTTER  ABLATION;  Surgeon: TEvans Lance MD;  [facility];  Service: Cardiovascular;  Laterality: N/A;   CARDIAC CATHETERIZATION     CARDIOVERSION N/A 05/01/2017   Procedure: CARDIOVERSION;  Surgeon: GMinna Merritts MD;  Location: [facility]S;  Service: Cardiovascular;  Laterality: N/A;   COLONOSCOPY WITH PROPOFOL N/A 04/07/2020   Procedure: COLONOSCOPY WITH PROPOFOL;  Surgeon: LLesly Rubenstein MD;  Location: [facility]COPY;  Service: Endoscopy;  Laterality: N/A;   CORONARY ANGIOPLASTY     ESOPHAGOGASTRODUODENOSCOPY N/A 04/07/2020   Procedure: ESOPHAGOGASTRODUODENOSCOPY (EGD);  Surgeon: LLesly Rubenstein MD;  [facility];  Service: Endoscopy;  Laterality: N/A;   ESOPHAGOGASTRODUODENOSCOPY (EGD) WITH PROPOFOL N/A 02/09/2018   Procedure: ESOPHAGOGASTRODUODENOSCOPY (EGD) WITH PROPOFOL;  Surgeon: TVirgel Manifold MD;  Location: [facility] ENDOSCOPY;  Service: Endoscopy;  Laterality: N/A;   RIGHT/LEFT HEART CATH AND CORONARY ANGIOGRAPHY N/A 07/18/2017   Procedure: RIGHT/LEFT HEART CATH AND CORONARY ANGIOGRAPHY;  Surgeon: AWellington Hampshire MD;  [facility];  Service: Cardiovascular;  Laterality: N/A;   TEE WITHOUT CARDIOVERSION N/A 05/01/2017   Procedure: TRANSESOPHAGEAL ECHOCARDIOGRAM (TEE);  Surgeon: GMinna Merritts MD;  Location: [facility];  Service: Cardiovascular;  Laterality: N/A;   TEE WITHOUT CARDIOVERSION N/A 06/16/2017   Procedure: TRANSESOPHAGEAL ECHOCARDIOGRAM (TEE);  Surgeon: NDorothy Spark MD;  [facility];  Service: Cardiovascular;  Laterality: N/A;    Social History   Socioeconomic  History   Marital status: Single    Spouse name: Not on file   Number of children: 1   Years of education: Not on file   Highest education level: Not on file  Occupational History   Occupation: Cook    Comment: [employer]  Tobacco Use   Smoking status: Former    Packs/day: 1.00    Years: 3.00    Total pack years: 3.00    Types:  Cigarettes    Quit date: 04/21/2017    Years since quitting: 5.1   Smokeless tobacco: Never  Vaping Use   Vaping Use: Never used  Substance and Sexual Activity   Alcohol use: Yes    Alcohol/week: 3.0 standard drinks of alcohol    Types: 3 Cans of beer per week    Comment: Drink a half of a 40oz beer every day, drank heavily in the past   Drug use: Never   Sexual activity: Yes    Partners: Female  Other Topics Concern   Not on file  Social History Narrative   Not on file   Social Determinants of Health   Financial Resource Strain: Low Risk  (06/15/2017)   Overall Financial Resource Strain (CARDIA)    Difficulty of Paying Living Expenses: Not hard at all  Food Insecurity: No Food Insecurity (10/05/2021)   Hunger Vital Sign    Worried About Running Out of Food in the Last Year: Never true    [place] in the Last Year: Never true  Transportation Needs: Unmet Transportation Needs (10/05/2021)   PRAPARE - Transportation    Lack of Transportation (Medical): Yes    Lack of Transportation (Non-Medical): Yes  Physical Activity: Insufficiently Active (06/15/2017)   Exercise Vital Sign    Days of Exercise per Week: 7 days    Minutes of Exercise per Session: 20 min  Stress: No Stress Concern Present (06/15/2017)   [place]    Feeling of Stress : Not at all  Social Connections: Somewhat Isolated (06/15/2017)   Social Connection and Isolation Panel [NHANES]    Frequency of Communication with Friends and Family: More than three times a week    Frequency of Social Gatherings with Friends and Family: Once a week    Attends Religious Services: 1 to 4 times per year    Active Member of [employer] or Organizations: No    Attends Archivist Meetings: Never    Marital Status: Divorced  Human resources officer Violence: Not At Risk (06/15/2017)   Humiliation, Afraid, Rape, and Kick questionnaire    Fear of Current or Ex-Partner:  No    Emotionally Abused: No    Physically Abused: No    Sexually Abused: No    Family History  Problem Relation Age of Onset   Alzheimer'Noretta Frier disease Mother    Alzheimer'Ephrata Verville disease Father     No Known Allergies  Review of Systems  All other systems reviewed and are negative.      Objective:   BP 110/76   Pulse 83   Ht '6\' 5"'$  (1.956 m)   Wt 238 lb (108 kg)   SpO2 97%   BMI 28.22 kg/m   Vitals:   05/29/22 1050  BP: 110/76  Pulse: 83  Height: '6\' 5"'$  (1.956 m)  Weight: 238 lb (108 kg)  SpO2: 97%  BMI (Calculated): 28.22    Physical Exam Vitals reviewed.  Constitutional:      Appearance: Normal  appearance.  HENT:     Head: Normocephalic.     Left Ear: There is no impacted cerumen.     Nose: Nose normal.     Mouth/Throat:     Mouth: Mucous membranes are moist.     Pharynx: No posterior oropharyngeal erythema.  Eyes:     Extraocular Movements: Extraocular movements intact.     Pupils: Pupils are equal, round, and reactive to light.  Cardiovascular:     Rate and Rhythm: Regular rhythm.     Chest Wall: PMI is not displaced.     Pulses: Normal pulses.     Heart sounds: Normal heart sounds. No murmur heard. Pulmonary:     Effort: Pulmonary effort is normal.     Breath sounds: Normal air entry. No rhonchi or rales.  Abdominal:     General: Abdomen is flat. Bowel sounds are normal. There is no distension.     Palpations: Abdomen is soft. There is no hepatomegaly, splenomegaly or mass.     Tenderness: There is no abdominal tenderness.  Musculoskeletal:        General: Normal range of motion.     Cervical back: Normal range of motion and neck supple.     Right lower leg: No edema.     Left lower leg: No edema.  Skin:    General: Skin is warm and dry.  Neurological:     Mental Status: He is alert and oriented to person, place, and time.     Cranial Nerves: Cranial nerve deficit present.     Motor: Weakness present.     Gait: Gait abnormal.     Deep Tendon  Reflexes: Reflexes abnormal.     Reflex Scores:      Brachioradialis reflexes are 2+ on the right side and 4+ on the left side.      Patellar reflexes are 2+ on the right side and 4+ on the left side.    Comments: L 7 UMN palsy  Psychiatric:        Mood and Affect: Mood normal.        Behavior: Behavior normal.      No results found for any visits on 05/29/22.      Assessment & Plan:   Problem List Items Addressed This Visit   None   No follow-ups on file.   Total time spent: 30 minutes  Volanda Napoleon, MD  05/29/2022

## 2022-06-14 ENCOUNTER — Other Ambulatory Visit: Payer: Self-pay | Admitting: Internal Medicine

## 2022-06-17 ENCOUNTER — Other Ambulatory Visit: Payer: Self-pay

## 2022-07-10 ENCOUNTER — Other Ambulatory Visit: Payer: Self-pay | Admitting: Cardiovascular Disease

## 2022-07-10 ENCOUNTER — Other Ambulatory Visit: Payer: 59

## 2022-07-10 DIAGNOSIS — I639 Cerebral infarction, unspecified: Secondary | ICD-10-CM | POA: Diagnosis not present

## 2022-07-11 LAB — LIPID PANEL
Chol/HDL Ratio: 2 ratio (ref 0.0–5.0)
Cholesterol, Total: 143 mg/dL (ref 100–199)
HDL: 72 mg/dL (ref >39)
LDL Chol Calc (NIH): 57 mg/dL (ref 0–99)
Triglycerides: 70 mg/dL (ref 0–149)
VLDL Cholesterol Cal: 14 mg/dL (ref 5–40)

## 2022-07-11 LAB — COMPREHENSIVE METABOLIC PANEL
ALT: 12 IU/L (ref 0–44)
AST: 14 IU/L (ref 0–40)
Albumin/Globulin Ratio: 1.2 (ref 1.2–2.2)
Albumin: 4.1 g/dL (ref 3.8–4.8)
Alkaline Phosphatase: 60 IU/L (ref 44–121)
BUN/Creatinine Ratio: 14 (ref 10–24)
BUN: 23 mg/dL (ref 8–27)
Bilirubin Total: 0.7 mg/dL (ref 0.0–1.2)
CO2: 21 mmol/L (ref 20–29)
Calcium: 9.9 mg/dL (ref 8.6–10.2)
Chloride: 103 mmol/L (ref 96–106)
Creatinine, Ser: 1.62 mg/dL — ABNORMAL HIGH (ref 0.76–1.27)
Globulin, Total: 3.5 g/dL (ref 1.5–4.5)
Glucose: 85 mg/dL (ref 70–99)
Potassium: 4.8 mmol/L (ref 3.5–5.2)
Sodium: 139 mmol/L (ref 134–144)
Total Protein: 7.6 g/dL (ref 6.0–8.5)
eGFR: 45 mL/min/{1.73_m2} — ABNORMAL LOW (ref >59)

## 2022-07-18 ENCOUNTER — Other Ambulatory Visit: Payer: Self-pay | Admitting: Nurse Practitioner

## 2022-07-18 NOTE — Telephone Encounter (Signed)
Patient wants refill called in to [facility] on [address] for his Tizanidine.

## 2022-07-19 NOTE — Telephone Encounter (Signed)
Patient called late yesterday afternoon requesting a refill on his Tizanidine 2 mg to [facility] on [address]  Please advise.

## 2022-07-22 ENCOUNTER — Encounter: Payer: Self-pay | Admitting: Internal Medicine

## 2022-07-22 ENCOUNTER — Ambulatory Visit (INDEPENDENT_AMBULATORY_CARE_PROVIDER_SITE_OTHER): Payer: 59 | Admitting: Internal Medicine

## 2022-07-22 VITALS — BP 102/60 | HR 97 | Ht 77.0 in | Wt 238.0 lb

## 2022-07-22 DIAGNOSIS — N1831 Chronic kidney disease, stage 3a: Secondary | ICD-10-CM | POA: Diagnosis not present

## 2022-07-22 DIAGNOSIS — G8929 Other chronic pain: Secondary | ICD-10-CM

## 2022-07-22 DIAGNOSIS — M545 Low back pain, unspecified: Secondary | ICD-10-CM | POA: Diagnosis not present

## 2022-07-22 DIAGNOSIS — I5022 Chronic systolic (congestive) heart failure: Secondary | ICD-10-CM | POA: Diagnosis not present

## 2022-07-22 DIAGNOSIS — E782 Mixed hyperlipidemia: Secondary | ICD-10-CM | POA: Diagnosis not present

## 2022-07-22 DIAGNOSIS — I639 Cerebral infarction, unspecified: Secondary | ICD-10-CM

## 2022-07-22 MED ORDER — TIZANIDINE HCL 2 MG PO TABS: 2.0000 mg | ORAL_TABLET | Freq: Three times a day (TID) | ORAL | 2 refills | Status: DC

## 2022-07-22 NOTE — Progress Notes (Signed)
Established Patient Office Visit  Subjective:  Patient ID: Nathan [name]., male    DOB: 07-21-1950  Age: 72 y.o. MRN: [id number]  Chief Complaint  Patient presents with   Follow-up    3 month follow up    No new complaints, here for lab review and medication refills. Labs reviewed and notable for deterioration in gfr while LDL remains at target. Admits to poor fluid intake.     No other concerns at this time.   Past Medical History:  Diagnosis Date   Atrial flutter ([facility])    a. Jareth Pardee/p TEE/DCCV 04/2017; b. 06/2017 Azyria Osmon/p RFCA; c. CHADS2VASc => 5 (CHF, HTN, age x 1, CVA)-->noncompliant with Xarelto.   Chronic combined systolic and diastolic CHF (congestive heart failure) ([facility])    a. TTE 2/19: EF 20-25%, diffuse HK; b. TTE 3/19: EF 20-25%, diffuse HK; c. 08/2017 Echo: EF 35-40%, diff HK. Gr1 DD; d. 06/2018 Echo: EF 40-45%, DD. Neg bubble study.   CKD (chronic kidney disease), stage II-III    Essential hypertension    GI bleed    a.  Hemorrhagic shock in 11/19 secondary to erosive gastropathy and Barrett'Jalyne Brodzinski esophagus requiring multiple units PRBCs; b. Cleared to resume OAC->pt did not.   NICM (nonischemic cardiomyopathy) ([facility])    a. 04/2017: Echo EF 20-25%; b. TTE 3/19: EF 20-25%; c. 07/2017 Cath: min irregs; d. 08/2017 Echo: EF 35-40%, diff HK; e. 06/2018 Echo: EF 40-45%, DD.   Noncompliance with medications    PAF (paroxysmal atrial fibrillation) ([facility])    a. 07/2017 afib->broke w/ IV amio->converted to oral bb due to prior intol to oral amio.   Pulmonary hypertension ([facility])    Stroke ([facility])    a. 06/2018 MRI/A Brain: R paramedian pons late acute/early subacute infarct, 22mm. No assoc hemorrhage or mass effect.  Sev chronic microvascular isch changtes and mod voluem loss. No large vessel occlusion, aneurysm, or significant stenosis.    Past Surgical History:  Procedure Laterality Date   A-FLUTTER ABLATION N/A 06/16/2017   Procedure: A-FLUTTER ABLATION;  Surgeon: Marinus Maw, MD;  [facility]  INV[facility];  Service: Cardiovascular;  Laterality: N/A;   CARDIAC CATHETERIZATION     CARDIOVERSION N/A 05/01/2017   Procedure: CARDIOVERSION;  Surgeon: Antonieta Iba, MD;  Location: [facility];  Service: Cardiovascular;  Laterality: N/A;   COLONOSCOPY WITH PROPOFOL N/A 04/07/2020   Procedure: COLONOSCOPY WITH PROPOFOL;  Surgeon: Regis Bill, MD;  Location: [facility];  Service: Endoscopy;  Laterality: N/A;   CORONARY ANGIOPLASTY     ESOPHAGOGASTRODUODENOSCOPY N/A 04/07/2020   Procedure: ESOPHAGOGASTRODUODENOSCOPY (EGD);  Surgeon: Regis Bill, MD;  [facility];  Service: Endoscopy;  Laterality: N/A;   ESOPHAGOGASTRODUODENOSCOPY (EGD) WITH PROPOFOL N/A 02/09/2018   Procedure: ESOPHAGOGASTRODUODENOSCOPY (EGD) WITH PROPOFOL;  Surgeon: Pasty Spillers, MD;  Location: [facility] ENDOSCOPY;  Service: Endoscopy;  Laterality: N/A;   RIGHT/LEFT HEART CATH AND CORONARY ANGIOGRAPHY N/A 07/18/2017   Procedure: RIGHT/LEFT HEART CATH AND CORONARY ANGIOGRAPHY;  Surgeon: Iran Ouch, MD;  Location: [facility] INVASIVE CV LAB;  Service: Cardiovascular;  Laterality: N/A;   TEE WITHOUT CARDIOVERSION N/A 05/01/2017   Procedure: TRANSESOPHAGEAL ECHOCARDIOGRAM (TEE);  Surgeon: Antonieta Iba, MD;  Location: [facility] ORS;  Service: Cardiovascular;  Laterality: N/A;   TEE WITHOUT CARDIOVERSION N/A 06/16/2017   Procedure: TRANSESOPHAGEAL ECHOCARDIOGRAM (TEE);  Surgeon: Lars Masson, MD;  Location[facility];  Service: Cardiovascular;  Laterality: N/A;    Social History   Socioeconomic History   Marital status:  Single    Spouse name: Not on file   Number of children: 1   Years of education: Not on file   Highest education level: Not on file  Occupational History   Occupation: Cook    Comment: [employer]  Tobacco Use   Smoking status: Former    Packs/day: 1.00    Years: 3.00    Additional pack years: 0.00    Total pack years: 3.00    Types: Cigarettes    Quit  date: 04/21/2017    Years since quitting: 5.2   Smokeless tobacco: Never  Vaping Use   Vaping Use: Never used  Substance and Sexual Activity   Alcohol use: Yes    Alcohol/week: 3.0 standard drinks of alcohol    Types: 3 Cans of beer per week    Comment: Drink a half of a 40oz beer every day, drank heavily in the past   Drug use: Never   Sexual activity: Yes    Partners: Female  Other Topics Concern   Not on file  Social History Narrative   Not on file   Social Determinants of Health   Financial Resource Strain: Low Risk  (06/15/2017)   Overall Financial Resource Strain (CARDIA)    Difficulty of Paying Living Expenses: Not hard at all  Food Insecurity: No Food Insecurity (10/05/2021)   Hunger Vital Sign    Worried About Running Out of Food in the Last Year: Never true    Ran Out of Food in the Last Year: Never true  Transportation Needs: Unmet Transportation Needs (10/05/2021)   PRAPARE - Transportation    Lack of Transportation (Medical): Yes    Lack of Transportation (Non-Medical): Yes  Physical Activity: Insufficiently Active (06/15/2017)   Exercise Vital Sign    Days of Exercise per Week: 7 days    Minutes of Exercise per Session: 20 min  Stress: No Stress Concern Present (06/15/2017)   [employer] of Occupational Health - Occupational Stress Questionnaire    Feeling of Stress : Not at all  Social Connections: Somewhat Isolated (06/15/2017)   Social Connection and Isolation Panel [NHANES]    Frequency of Communication with Friends and Family: More than three times a week    Frequency of Social Gatherings with Friends and Family: Once a week    Attends Religious Services: 1 to 4 times per year    Active Member of [employer] or Organizations: No    Attends Banker Meetings: Never    Marital Status: Divorced  Catering manager Violence: Not At Risk (06/15/2017)   Humiliation, Afraid, Rape, and Kick questionnaire    Fear of Current or Ex-Partner: No    Emotionally  Abused: No    Physically Abused: No    Sexually Abused: No    Family History  Problem Relation Age of Onset   Alzheimer'Brindle Leyba disease Mother    Alzheimer'Adilynne Fitzwater disease Father     No Known Allergies  Review of Systems  Constitutional: Negative.   HENT: Negative.    Eyes: Negative.   Respiratory: Negative.    Cardiovascular: Negative.   Gastrointestinal: Negative.   Genitourinary: Negative.   Skin: Negative.   Neurological: Negative.   Endo/Heme/Allergies: Negative.        Objective:   BP 102/60   Pulse 97   Ht 6\' 5"  (1.956 m)   Wt 238 lb (108 kg)   SpO2 98%   BMI 28.22 kg/m   Vitals:   07/22/22 0952  BP: 102/60  Pulse: 97  Height: 6\' 5"  (1.956 m)  Weight: 238 lb (108 kg)  SpO2: 98%  BMI (Calculated): 28.22    Physical Exam Vitals reviewed.  Constitutional:      Appearance: Normal appearance.  HENT:     Head: Normocephalic.     Left Ear: There is no impacted cerumen.     Nose: Nose normal.     Mouth/Throat:     Mouth: Mucous membranes are moist.     Pharynx: No posterior oropharyngeal erythema.  Eyes:     Extraocular Movements: Extraocular movements intact.     Pupils: Pupils are equal, round, and reactive to light.  Cardiovascular:     Rate and Rhythm: Regular rhythm.     Chest Wall: PMI is not displaced.     Pulses: Normal pulses.     Heart sounds: Normal heart sounds. No murmur heard. Pulmonary:     Effort: Pulmonary effort is normal.     Breath sounds: Normal air entry. No rhonchi or rales.  Abdominal:     General: Abdomen is flat. Bowel sounds are normal. There is no distension.     Palpations: Abdomen is soft. There is no hepatomegaly, splenomegaly or mass.     Tenderness: There is no abdominal tenderness.  Musculoskeletal:        General: Normal range of motion.     Cervical back: Normal range of motion and neck supple.     Right lower leg: No edema.     Left lower leg: No edema.  Skin:    General: Skin is warm and dry.  Neurological:      General: No focal deficit present.     Mental Status: He is alert and oriented to person, place, and time.     Cranial Nerves: No cranial nerve deficit.     Motor: No weakness.  Psychiatric:        Mood and Affect: Mood normal.        Behavior: Behavior normal.      No results found for any visits on 07/22/22.  Recent Results (from the past 2160 hour(Minaal Struckman))  Comprehensive metabolic panel     Status: Abnormal   Collection Time: 07/10/22  9:48 AM  Result Value Ref Range   Glucose 85 70 - 99 mg/dL   BUN 23 8 - 27 mg/dL   Creatinine, Ser 1.61 (H) 0.76 - 1.27 mg/dL   eGFR 45 (L) >09 UE/AVW/0.98   BUN/Creatinine Ratio 14 10 - 24   Sodium 139 134 - 144 mmol/L   Potassium 4.8 3.5 - 5.2 mmol/L   Chloride 103 96 - 106 mmol/L   CO2 21 20 - 29 mmol/L   Calcium 9.9 8.6 - 10.2 mg/dL   Total Protein 7.6 6.0 - 8.5 g/dL   Albumin 4.1 3.8 - 4.8 g/dL   Globulin, Total 3.5 1.5 - 4.5 g/dL   Albumin/Globulin Ratio 1.2 1.2 - 2.2   Bilirubin Total 0.7 0.0 - 1.2 mg/dL   Alkaline Phosphatase 60 44 - 121 IU/L   AST 14 0 - 40 IU/L   ALT 12 0 - 44 IU/L  Lipid panel     Status: None   Collection Time: 07/10/22  9:48 AM  Result Value Ref Range   Cholesterol, Total 143 100 - 199 mg/dL   Triglycerides 70 0 - 149 mg/dL   HDL 72 >11 mg/dL   VLDL Cholesterol Cal 14 5 - 40 mg/dL   LDL Chol Calc (NIH) 57 0 - 99  mg/dL   Chol/HDL Ratio 2.0 0.0 - 5.0 ratio    Comment:                                   T. Chol/HDL Ratio                                             Men  Women                               1/2 Avg.Risk  3.4    3.3                                   Avg.Risk  5.0    4.4                                2X Avg.Risk  9.6    7.1                                3X Avg.Risk 23.4   11.0       Assessment & Plan:   Problem List Items Addressed This Visit   None   No follow-ups on file.   Total time spent: 30 minutes  Luna Fuse, MD  07/22/2022

## 2022-07-22 NOTE — Addendum Note (Signed)
Addended by: Aundria Mems AHMAD on: 07/22/2022 10:22 AM   Modules accepted: Orders

## 2022-08-07 DIAGNOSIS — I679 Cerebrovascular disease, unspecified: Secondary | ICD-10-CM | POA: Diagnosis not present

## 2022-08-07 DIAGNOSIS — I509 Heart failure, unspecified: Secondary | ICD-10-CM | POA: Diagnosis not present

## 2022-08-07 DIAGNOSIS — R2681 Unsteadiness on feet: Secondary | ICD-10-CM | POA: Diagnosis not present

## 2022-08-09 DIAGNOSIS — I639 Cerebral infarction, unspecified: Secondary | ICD-10-CM | POA: Diagnosis not present

## 2022-08-19 NOTE — Progress Notes (Unsigned)
., Cardiology Office Note  Date:  08/20/2022   ID:  Burna Sis., DOB [birth year], MRN [id number]  PCP:  Sherron Monday, MD   Chief Complaint  Patient presents with   6 month follow up     "Doing well." Medications reviewed by the patient verbally.     HPI:  72 y.o. male with past medical history of CAD tobacco abuse quit 04/28/17  Admission to the hospital 04/2017 with new onset atrial flutter with RVR,  elevated troponin,  acute combined systolic and diastolic CHF Ejection fraction 20-25% February 13th 2019 S/p TEE and cardioversion 05/01/2017 Back into atrial flutter, ejection fraction 20-25% Flutter ablation 06/16/2017 Was in atrial fibrillation on office visit with Dr. Graciela Husbands July 05 2017 Interval atrial fibrillation and developed a stroke 4/20 ,currently anticoagulated with Eliquis. Echo 06/2018: ef 40 to 45% Echo June 2022 EF 55% Who presents for follow up of his cardiomyopathy, atrial flutter , history of cardioversion  Last seen in clinic by myself November 2023 Followed by CHF clinic, last seen January 2024  Nonsmoker In follow-up today reports he feels well with no complaints Uses a walker to ambulate No regular exercise program,  1-2 beers a day Has an aide who drives, He does not drive  Not on Lasix Denies significant lower extremity edema, no SOB  No arrhythmia  Labs reviewed Total chol 143, LDL 57,  CR 1.62  Previous discussions concerning his tachyarrhythmia, history of atrial fibrillation/flutter Previously did not want blood thinners, " I almost bled out" on eliquis Does not want watchman device  EKG personally reviewed by myself on todays visit Nsr rate 69 no significant ST-T wave changes  Other past medical history reviewed Echo 6/22  1. Left ventricular ejection fraction, by estimation, is 55 %. The left  ventricle has normal function. Unable to exclude hypokinesis of the basal  to mid inferior wall. There is mild left ventricular  hypertrophy.   2. Right ventricular systolic function is normal. The right ventricular  size is normal.   3. The mitral valve is normal in structure. No evidence of mitral valve  regurgitation. No evidence of mitral stenosis.   hospital January 2022 symptomatic anemia, CHF Hemoglobin 7.4  s/p Transfuse 1 unit of blood which is ordered by ED physician. anemia panel, iron saturation 8%,  CVA 06/2018: left side, left hand back 80%, not as strong   cardiac catheterization 07/18/2017 showing nonobstructive disease Heavy calcification markedly elevated left ventricular end-diastolic pressures Insisted on leaving the hospital 07/19/2017  Discharged in normal sinus rhythm Lasix up to 40 twice a day with potassium 20 twice a day   atrial flutter with RVR with 2:1 AV block with heart rates in the 120s bpm Despite advnacing meds He was refusing  metoprolol  -Echo showed EF 20-25% (done while tachycardic), left atrium 54 mm -CHADS2VASC at least least 2 (CHF, age x 1)  converted with ablation  Elevated pressures obtained through echocardiogram, TEE D/c  on Lasix 40 daily  PMH:   has a past medical history of Atrial flutter (HCC), Chronic combined systolic and diastolic CHF (congestive heart failure) (HCC), CKD (chronic kidney disease), stage II-III, Essential hypertension, GI bleed, NICM (nonischemic cardiomyopathy) (HCC), Noncompliance with medications, PAF (paroxysmal atrial fibrillation) (HCC), Pulmonary hypertension (HCC), and Stroke (HCC).  PSH:    Past Surgical History:  Procedure Laterality Date   A-FLUTTER ABLATION N/A 06/16/2017   Procedure: A-FLUTTER ABLATION;  Surgeon: Marinus Maw, MD;  Location: [facility] INVASIVE CV LAB;  Service: Cardiovascular;  Laterality: N/A;   CARDIAC CATHETERIZATION     CARDIOVERSION N/A 05/01/2017   Procedure: CARDIOVERSION;  Surgeon: Antonieta Iba, MD;  Location: [facility] ORS;  Service: Cardiovascular;  Laterality: N/A;   COLONOSCOPY WITH PROPOFOL N/A  04/07/2020   Procedure: COLONOSCOPY WITH PROPOFOL;  Surgeon: Regis Bill, MD;  Location: [facility] ENDOSCOPY;  Service: Endoscopy;  Laterality: N/A;   CORONARY ANGIOPLASTY     ESOPHAGOGASTRODUODENOSCOPY N/A 04/07/2020   Procedure: ESOPHAGOGASTRODUODENOSCOPY (EGD);  Surgeon: Regis Bill, MD;  Location: [facility] ENDOSCOPY;  Service: Endoscopy;  Laterality: N/A;   ESOPHAGOGASTRODUODENOSCOPY (EGD) WITH PROPOFOL N/A 02/09/2018   Procedure: ESOPHAGOGASTRODUODENOSCOPY (EGD) WITH PROPOFOL;  Surgeon: Pasty Spillers, MD;  Location: [facility] ENDOSCOPY;  Service: Endoscopy;  Laterality: N/A;   RIGHT/LEFT HEART CATH AND CORONARY ANGIOGRAPHY N/A 07/18/2017   Procedure: RIGHT/LEFT HEART CATH AND CORONARY ANGIOGRAPHY;  Surgeon: Iran Ouch, MD;  Location: [facility] INVASIVE CV LAB;  Service: Cardiovascular;  Laterality: N/A;   TEE WITHOUT CARDIOVERSION N/A 05/01/2017   Procedure: TRANSESOPHAGEAL ECHOCARDIOGRAM (TEE);  Surgeon: Antonieta Iba, MD;  Location: [facility] ORS;  Service: Cardiovascular;  Laterality: N/A;   TEE WITHOUT CARDIOVERSION N/A 06/16/2017   Procedure: TRANSESOPHAGEAL ECHOCARDIOGRAM (TEE);  Surgeon: Lars Masson, MD;  Location: [facility] ENDOSCOPY;  Service: Cardiovascular;  Laterality: N/A;    Current Outpatient Medications  Medication Sig Dispense Refill   acetaminophen (TYLENOL) 500 MG tablet Take 500-1,000 mg by mouth 2 (two) times daily as needed for mild pain or moderate pain.     tiZANidine (ZANAFLEX) 2 MG tablet Take 1 tablet (2 mg total) by mouth 3 (three) times daily. 90 tablet 2   atorvastatin (LIPITOR) 80 MG tablet Take 1 tablet (80 mg total) by mouth daily. 90 tablet 3   metoprolol succinate (TOPROL-XL) 25 MG 24 hr tablet Take 1 tablet (25 mg total) by mouth daily. 90 tablet 3   sacubitril-valsartan (ENTRESTO) 24-26 MG Take 1 tablet by mouth 2 (two) times daily. 180 tablet 3   spironolactone (ALDACTONE) 25 MG tablet Take 1 tablet (25 mg total) by mouth daily. 90 tablet 3   No  current facility-administered medications for this visit.    Allergies:   Patient has no known allergies.   Social History:  The patient  reports that he quit smoking about 5 years ago. His smoking use included cigarettes. He has a 3.00 pack-year smoking history. He has never used smokeless tobacco. He reports current alcohol use of about 3.0 standard drinks of alcohol per week. He reports that he does not use drugs.   Family History:   family history includes Alzheimer's disease in his father and mother.    Review of Systems: Review of Systems  Constitutional: Negative.   Respiratory: Negative.    Cardiovascular: Negative.   Gastrointestinal: Negative.   Musculoskeletal: Negative.   Neurological: Negative.   Psychiatric/Behavioral: Negative.    All other systems reviewed and are negative.   PHYSICAL EXAM: VS:  BP 120/80 (BP Location: Right Arm, Patient Position: Sitting, Cuff Size: Normal)   Pulse 69   Ht 6\' 5"  (1.956 m)   Wt 238 lb (108 kg)   SpO2 95%   BMI 28.22 kg/m  , BMI Body mass index is 28.22 kg/m.  Constitutional:  oriented to person, place, and time. No distress.  HENT:  Head: Grossly normal Eyes:  no discharge. No scleral icterus.  Neck: No JVD, no carotid bruits  Cardiovascular: Regular rate and rhythm, no murmurs appreciated Pulmonary/Chest: Clear to auscultation  bilaterally, no wheezes or rails Abdominal: Soft.  no distension.  no tenderness.  Musculoskeletal: Normal range of motion Neurological:  normal muscle tone. Coordination normal. No atrophy Skin: Skin warm and dry Psychiatric: normal affect, pleasant  Recent Labs: 04/16/2022: Hemoglobin 13.4; Platelets 246 07/10/2022: ALT 12; BUN 23; Creatinine, Ser 1.62; Potassium 4.8; Sodium 139    Lipid Panel Lab Results  Component Value Date   CHOL 143 07/10/2022   HDL 72 07/10/2022   LDLCALC 57 07/10/2022   TRIG 70 07/10/2022      Wt Readings from Last 3 Encounters:  08/20/22 238 lb (108 kg)   07/22/22 238 lb (108 kg)  05/29/22 238 lb (108 kg)     ASSESSMENT AND PLAN:  Atrial flutter with rapid ventricular response (HCC)  Prior ablation History of stroke, previously declined anticoagulation Continue metoprolol Discussion again concerning anticoagulation, risk and benefit  Declining anticoagulation Previously declined placement of Watchman device  History of stroke Reports residual left side hand weakness, left leg weakness chronic swelling left leg Walks with walker Declining anticoagulation or Watchman device  dilated cardiomyopathy /tachycardia mediated Maintaining normal sinus rhythm, ejection fraction normalized Ejection fraction has improved, echocardiogram 2022 Ejection fraction  55%  Not on Lasix  Acute renal failure Followed by primary care creatinine 1.6 up from his baseline Suspect prerenal state He reports primary care to recheck lab work every 3 months He will increase his fluids  Pulmonary HTN (HCC) - Plan: EKG 12-Lead Appears euvolemic, denies shortness of breath, no leg swelling  Acute on chronic combined systolic and diastolic CHF (congestive heart failure) (HCC) - Normal EF on echo June 2022 Improved function and normal sinus rhythm  Atrial fibrillation Maintaining normal sinus rhythm Intolerant of amiodarone Declining anticoagulation Continue metoprolol succinate 25 daily   Total encounter time more than 30 minutes  Greater than 50% was spent in counseling and coordination of care with the patient    Orders Placed This Encounter  Procedures   EKG 12-Lead     Signed, Dossie Arbour, M.D., Ph.D. 08/20/2022  [facility] [place], [place] [phone]

## 2022-08-20 ENCOUNTER — Encounter: Payer: Self-pay | Admitting: Cardiovascular Disease

## 2022-08-20 ENCOUNTER — Ambulatory Visit: Payer: 59 | Attending: Cardiovascular Disease | Admitting: Cardiovascular Disease

## 2022-08-20 VITALS — BP 120/80 | HR 69 | Ht 77.0 in | Wt 238.0 lb

## 2022-08-20 DIAGNOSIS — I480 Paroxysmal atrial fibrillation: Secondary | ICD-10-CM

## 2022-08-20 DIAGNOSIS — D649 Anemia, unspecified: Secondary | ICD-10-CM | POA: Diagnosis not present

## 2022-08-20 DIAGNOSIS — I10 Essential (primary) hypertension: Secondary | ICD-10-CM

## 2022-08-20 DIAGNOSIS — I428 Other cardiomyopathies: Secondary | ICD-10-CM | POA: Diagnosis not present

## 2022-08-20 DIAGNOSIS — I4892 Unspecified atrial flutter: Secondary | ICD-10-CM

## 2022-08-20 DIAGNOSIS — E782 Mixed hyperlipidemia: Secondary | ICD-10-CM | POA: Diagnosis not present

## 2022-08-20 DIAGNOSIS — N183 Chronic kidney disease, stage 3 unspecified: Secondary | ICD-10-CM

## 2022-08-20 DIAGNOSIS — I5022 Chronic systolic (congestive) heart failure: Secondary | ICD-10-CM | POA: Diagnosis not present

## 2022-08-20 MED ORDER — ATORVASTATIN CALCIUM 80 MG PO TABS: 80.0000 mg | ORAL_TABLET | Freq: Every day | ORAL | 3 refills | Status: DC

## 2022-08-20 MED ORDER — METOPROLOL SUCCINATE ER 25 MG PO TB24: 25.0000 mg | ORAL_TABLET | Freq: Every day | ORAL | 3 refills | Status: DC

## 2022-08-20 MED ORDER — SPIRONOLACTONE 25 MG PO TABS: 25.0000 mg | ORAL_TABLET | Freq: Every day | ORAL | 3 refills | Status: DC

## 2022-08-20 MED ORDER — ENTRESTO 24-26 MG PO TABS: 1.0000 | ORAL_TABLET | Freq: Two times a day (BID) | ORAL | 3 refills | Status: DC

## 2022-08-20 NOTE — Patient Instructions (Signed)
Medication Instructions:  No changes  If you need a refill on your cardiac medications before your next appointment, please call your pharmacy.   Lab work: No new labs needed  Testing/Procedures: No new testing needed  Follow-Up: At [facility], you and your health needs are our priority.  As part of our continuing mission to provide you with exceptional heart care, we have created designated Provider Care Teams.  These Care Teams include your primary Cardiologist (physician) and Advanced Practice Providers (APPs -  Physician Assistants and Nurse Practitioners) who all work together to provide you with the care you need, when you need it.  You will need a follow up appointment in 12 months  Providers on your designated Care Team:   Christopher Berge, NP Ryan Dunn, PA-C Cadence Furth, PA-C  COVID-19 Vaccine Information can be found at: [email or web address] For questions related to vaccine distribution or appointments, please email [email or web address] or call [phone].   

## 2022-09-09 DIAGNOSIS — I639 Cerebral infarction, unspecified: Secondary | ICD-10-CM | POA: Diagnosis not present

## 2022-09-24 ENCOUNTER — Encounter: Payer: Self-pay | Admitting: Family

## 2022-09-24 ENCOUNTER — Ambulatory Visit: Payer: 59 | Attending: Family | Admitting: Family

## 2022-09-24 VITALS — BP 120/67 | HR 88 | Ht 77.0 in | Wt 237.0 lb

## 2022-09-24 DIAGNOSIS — Z87891 Personal history of nicotine dependence: Secondary | ICD-10-CM | POA: Insufficient documentation

## 2022-09-24 DIAGNOSIS — I5022 Chronic systolic (congestive) heart failure: Secondary | ICD-10-CM | POA: Insufficient documentation

## 2022-09-24 DIAGNOSIS — I428 Other cardiomyopathies: Secondary | ICD-10-CM | POA: Diagnosis not present

## 2022-09-24 DIAGNOSIS — I272 Pulmonary hypertension, unspecified: Secondary | ICD-10-CM | POA: Diagnosis not present

## 2022-09-24 DIAGNOSIS — N189 Chronic kidney disease, unspecified: Secondary | ICD-10-CM | POA: Insufficient documentation

## 2022-09-24 DIAGNOSIS — I2510 Atherosclerotic heart disease of native coronary artery without angina pectoris: Secondary | ICD-10-CM | POA: Diagnosis not present

## 2022-09-24 DIAGNOSIS — Z5982 Transportation insecurity: Secondary | ICD-10-CM | POA: Insufficient documentation

## 2022-09-24 DIAGNOSIS — I10 Essential (primary) hypertension: Secondary | ICD-10-CM

## 2022-09-24 DIAGNOSIS — Z8673 Personal history of transient ischemic attack (TIA), and cerebral infarction without residual deficits: Secondary | ICD-10-CM | POA: Diagnosis not present

## 2022-09-24 DIAGNOSIS — Z79899 Other long term (current) drug therapy: Secondary | ICD-10-CM | POA: Diagnosis not present

## 2022-09-24 DIAGNOSIS — I480 Paroxysmal atrial fibrillation: Secondary | ICD-10-CM | POA: Insufficient documentation

## 2022-09-24 DIAGNOSIS — I130 Hypertensive heart and chronic kidney disease with heart failure and stage 1 through stage 4 chronic kidney disease, or unspecified chronic kidney disease: Secondary | ICD-10-CM | POA: Insufficient documentation

## 2022-09-24 NOTE — Patient Instructions (Signed)
When you have your echocardiogram, [facility].

## 2022-09-24 NOTE — Progress Notes (Signed)
PCP: Gillermo Murdoch, MD (last seen 05/24) Primary Cardiologist: Julien Nordmann, MD (last see 06/24)  HPI:  Nathan [name] is a 72 y/o male with a history of HTN, stroke, atrial flutter (04/2017), CKD, GIB, pulmonary HTN, previous tobacco use and chronic heart failure. Cardioverted 05/01/17 and had ablation 06/2017.   Echo 08/29/20: EF is 55 %. The left ventricle has normal function. The left ventricle has no regional wall motion abnormalities. The left ventricular internal cavity size was normal in size. There is mild left ventricular hypertrophy. Left ventricular diastolic parameters are indeterminate. Echo 07/01/2018: EF of 40-45%. Echo 09/04/17: EF of 35-40%. Echo 06/16/17: EF of 15-20% along with mod-severe TR and an elevated PA pressure of 45 mm Hg.  Cardiac catheterization done 07/18/17 and showed heavily calcified coronary arteries with mild nonobstructive disease.  Left dominant system. Right heart catheterization showed moderately to severely elevated filling pressures, severe pulmonary hypertension and severely reduced cardiac output.   Has not been admitted or been in the ED in the last 6 months.    He presents today for a HF visit with a chief complaint of a follow-up visit. Denies fatigue, SOB, chest pain, palpitations, abdominal distention, pedal edema, dizziness, weight gain or difficulty sleeping. Continues to decline the watchman device or anticoagulant as he took eliquis once before and had a big bleed. Overall, he says that he's feeling great and has no complaints.   ROS: All systems negative except as listed in HPI, PMH and Problem List.  SH:  Social History   Socioeconomic History   Marital status: Single    Spouse name: Not on file   Number of children: 1   Years of education: Not on file   Highest education level: Not on file  Occupational History   Occupation: Cook    Comment: [employer]  Tobacco Use   Smoking status: Former    Packs/day: 1.00    Years: 3.00     Additional pack years: 0.00    Total pack years: 3.00    Types: Cigarettes    Quit date: 04/21/2017    Years since quitting: 5.4   Smokeless tobacco: Never  Vaping Use   Vaping Use: Never used  Substance and Sexual Activity   Alcohol use: Yes    Alcohol/week: 3.0 standard drinks of alcohol    Types: 3 Cans of beer per week    Comment: Drink a half of a 40oz beer every day, drank heavily in the past   Drug use: Never   Sexual activity: Yes    Partners: Female  Other Topics Concern   Not on file  Social History Narrative   Not on file   Social Determinants of Health   Financial Resource Strain: Low Risk  (06/15/2017)   Overall Financial Resource Strain (CARDIA)    Difficulty of Paying Living Expenses: Not hard at all  Food Insecurity: No Food Insecurity (10/05/2021)   Hunger Vital Sign    Worried About Running Out of Food in the Last Year: Never true    Ran Out of Food in the Last Year: Never true  Transportation Needs: Unmet Transportation Needs (10/05/2021)   PRAPARE - Transportation    Lack of Transportation (Medical): Yes    Lack of Transportation (Non-Medical): Yes  Physical Activity: Insufficiently Active (06/15/2017)   Exercise Vital Sign    Days of Exercise per Week: 7 days    Minutes of Exercise per Session: 20 min  Stress: No Stress Concern Present (06/15/2017)  [employer] of Occupational Health - Occupational Stress Questionnaire    Feeling of Stress : Not at all  Social Connections: Somewhat Isolated (06/15/2017)   Social Connection and Isolation Panel [NHANES]    Frequency of Communication with Friends and Family: More than three times a week    Frequency of Social Gatherings with Friends and Family: Once a week    Attends Religious Services: 1 to 4 times per year    Active Member of [employer] or Organizations: No    Attends Banker Meetings: Never    Marital Status: Divorced  Catering manager Violence: Not At Risk (06/15/2017)   Humiliation,  Afraid, Rape, and Kick questionnaire    Fear of Current or Ex-Partner: No    Emotionally Abused: No    Physically Abused: No    Sexually Abused: No    FH:  Family History  Problem Relation Age of Onset   Alzheimer's disease Mother    Alzheimer's disease Father     Past Medical History:  Diagnosis Date   Atrial flutter (HCC)    a. s/p TEE/DCCV 04/2017; b. 06/2017 s/p RFCA; c. CHADS2VASc => 5 (CHF, HTN, age x 1, CVA)-->noncompliant with Xarelto.   Chronic combined systolic and diastolic CHF (congestive heart failure) (HCC)    a. TTE 2/19: EF 20-25%, diffuse HK; b. TTE 3/19: EF 20-25%, diffuse HK; c. 08/2017 Echo: EF 35-40%, diff HK. Gr1 DD; d. 06/2018 Echo: EF 40-45%, DD. Neg bubble study.   CKD (chronic kidney disease), stage II-III    Essential hypertension    GI bleed    a.  Hemorrhagic shock in 11/19 secondary to erosive gastropathy and Barrett's esophagus requiring multiple units PRBCs; b. Cleared to resume OAC->pt did not.   NICM (nonischemic cardiomyopathy) (HCC)    a. 04/2017: Echo EF 20-25%; b. TTE 3/19: EF 20-25%; c. 07/2017 Cath: min irregs; d. 08/2017 Echo: EF 35-40%, diff HK; e. 06/2018 Echo: EF 40-45%, DD.   Noncompliance with medications    PAF (paroxysmal atrial fibrillation) (HCC)    a. 07/2017 afib->broke w/ IV amio->converted to oral bb due to prior intol to oral amio.   Pulmonary hypertension (HCC)    Stroke (HCC)    a. 06/2018 MRI/A Brain: R paramedian pons late acute/early subacute infarct, 22mm. No assoc hemorrhage or mass effect.  Sev chronic microvascular isch changtes and mod voluem loss. No large vessel occlusion, aneurysm, or significant stenosis.    Current Outpatient Medications  Medication Sig Dispense Refill   acetaminophen (TYLENOL) 500 MG tablet Take 500-1,000 mg by mouth 2 (two) times daily as needed for mild pain or moderate pain.     atorvastatin (LIPITOR) 80 MG tablet Take 1 tablet (80 mg total) by mouth daily. 90 tablet 3   metoprolol succinate  (TOPROL-XL) 25 MG 24 hr tablet Take 1 tablet (25 mg total) by mouth daily. 90 tablet 3   sacubitril-valsartan (ENTRESTO) 24-26 MG Take 1 tablet by mouth 2 (two) times daily. 180 tablet 3   spironolactone (ALDACTONE) 25 MG tablet Take 1 tablet (25 mg total) by mouth daily. 90 tablet 3   tiZANidine (ZANAFLEX) 2 MG tablet Take 1 tablet (2 mg total) by mouth 3 (three) times daily. 90 tablet 2   No current facility-administered medications for this visit.   Vitals:   09/24/22 0951  BP: 120/67  Pulse: 88  SpO2: 100%  Weight: 237 lb (107.5 kg)  Height: 6\' 5"  (1.956 m)   Wt Readings from Last 3 Encounters:  09/24/22 237 lb (107.5 kg)  08/20/22 238 lb (108 kg)  07/22/22 238 lb (108 kg)   Lab Results  Component Value Date   CREATININE 1.62 (H) 07/10/2022   CREATININE 1.32 (H) 04/16/2022   CREATININE 1.21 03/26/2022   PHYSICAL EXAM:  General:  Well appearing. No resp difficulty HEENT: normal Neck: supple. JVP flat. No lymphadenopathy or thryomegaly appreciated. Cor: PMI normal. Regular rate & rhythm. No rubs, gallops or murmurs. Lungs: clear Abdomen: soft, nontender, nondistended. No hepatosplenomegaly. No bruits or masses.  Extremities: no cyanosis, clubbing, rash, edema Neuro: alert & oriented x3, cranial nerves grossly intact. Moves all 4 extremities w/o difficulty. Affect pleasant.   ECG: 08/20/22 showed NSR   ASSESSMENT & PLAN:  1: NICM with preserved ejection fraction- - likely due to HTN as cath showed nonobstructive CAD - NYHA class I - euvolemic today - weighing daily at home; reviewed the importance of calling for any overnight weight gain of >2 pounds or a weekly weight gain of >5 pounds - weight down 1 pound from last visit 6 months ago - Echo 08/29/20: EF is 55 %. The left ventricle has normal function. The left ventricle has no regional wall motion abnormalities. The left ventricular internal cavity size was normal in size. There is mild left ventricular hypertrophy.  Left ventricular diastolic parameters are indeterminate.  - Echo 07/01/2018: EF of 40-45%.  - Echo 09/04/17: EF of 35-40%.  - Echo 06/16/17: EF of 15-20% along with mod-severe TR and an elevated PA pressure of 45 mm Hg. - updated echo has been scheduled for 11/28/22 - not adding salt to his food and tries to eat low sodium foods. Reminded to closely follow a 2000mg  sodium diet; using pepper for seasoning - continue metoprolol succinate 25mg  daily - continue entresto 24/26mg  BID - continue spironolactone 25mg  daily - consider SGLT2 but he's not terribly interested in adding additional medication; will discuss further after getting echo results back - BNP on 04/04/20 was 138.8  2: HTN- - BP 120/67 - saw PCP (Tejan-Sie) 05/26 - BMP 07/10/22 showed sodium 139, potassium 4.8, creatinine 1.62 & GFR 56  3: Atrial fibrillation- - saw cardiology Mariah Milling) 06/24 - cardioverted 04/2017 - ablation done 06/2017 - declines watchman device - doesn't want blood thinners because he "almost bled out on eliquis"  4: CAD- - Cardiac catheterization done 07/18/17 and showed heavily calcified coronary arteries with mild nonobstructive disease.  Left dominant system. Right heart catheterization showed moderately to severely elevated filling pressures, severe pulmonary hypertension and severely reduced cardiac output.   Return in 6 months, sooner if needed.

## 2022-09-30 DIAGNOSIS — B351 Tinea unguium: Secondary | ICD-10-CM | POA: Diagnosis not present

## 2022-09-30 DIAGNOSIS — M79675 Pain in left toe(s): Secondary | ICD-10-CM | POA: Diagnosis not present

## 2022-09-30 DIAGNOSIS — M79674 Pain in right toe(s): Secondary | ICD-10-CM | POA: Diagnosis not present

## 2022-10-09 ENCOUNTER — Other Ambulatory Visit: Payer: Self-pay

## 2022-10-09 ENCOUNTER — Other Ambulatory Visit: Payer: 59

## 2022-10-09 DIAGNOSIS — N1831 Chronic kidney disease, stage 3a: Secondary | ICD-10-CM | POA: Diagnosis not present

## 2022-10-09 DIAGNOSIS — I639 Cerebral infarction, unspecified: Secondary | ICD-10-CM | POA: Diagnosis not present

## 2022-10-09 DIAGNOSIS — I5022 Chronic systolic (congestive) heart failure: Secondary | ICD-10-CM | POA: Diagnosis not present

## 2022-10-09 DIAGNOSIS — E782 Mixed hyperlipidemia: Secondary | ICD-10-CM | POA: Diagnosis not present

## 2022-10-21 ENCOUNTER — Ambulatory Visit (INDEPENDENT_AMBULATORY_CARE_PROVIDER_SITE_OTHER): Payer: 59 | Admitting: Internal Medicine

## 2022-10-21 ENCOUNTER — Encounter: Payer: Self-pay | Admitting: Internal Medicine

## 2022-10-21 VITALS — BP 110/70 | HR 68 | Ht 77.0 in | Wt 238.0 lb

## 2022-10-21 DIAGNOSIS — G8929 Other chronic pain: Secondary | ICD-10-CM | POA: Diagnosis not present

## 2022-10-21 DIAGNOSIS — I639 Cerebral infarction, unspecified: Secondary | ICD-10-CM | POA: Diagnosis not present

## 2022-10-21 DIAGNOSIS — N1831 Chronic kidney disease, stage 3a: Secondary | ICD-10-CM

## 2022-10-21 DIAGNOSIS — E782 Mixed hyperlipidemia: Secondary | ICD-10-CM | POA: Diagnosis not present

## 2022-10-21 DIAGNOSIS — M545 Low back pain, unspecified: Secondary | ICD-10-CM | POA: Diagnosis not present

## 2022-10-21 DIAGNOSIS — D631 Anemia in chronic kidney disease: Secondary | ICD-10-CM | POA: Diagnosis not present

## 2022-10-21 DIAGNOSIS — I10 Essential (primary) hypertension: Secondary | ICD-10-CM

## 2022-10-21 DIAGNOSIS — Z0001 Encounter for general adult medical examination with abnormal findings: Secondary | ICD-10-CM

## 2022-10-21 DIAGNOSIS — N183 Chronic kidney disease, stage 3 unspecified: Secondary | ICD-10-CM | POA: Diagnosis not present

## 2022-10-21 DIAGNOSIS — N400 Enlarged prostate without lower urinary tract symptoms: Secondary | ICD-10-CM

## 2022-10-21 DIAGNOSIS — Z1331 Encounter for screening for depression: Secondary | ICD-10-CM

## 2022-10-21 MED ORDER — TIZANIDINE HCL 2 MG PO TABS: 2.0000 mg | ORAL_TABLET | Freq: Three times a day (TID) | ORAL | 2 refills | Status: DC

## 2022-10-21 NOTE — Progress Notes (Signed)
**Note De-Identified via Obfucation** Etablihed Patient Office Viit  Subjective:  Patient ID: Nathan Niebla., male    DOB: 1950/07/27  Age: 72 y.o. MRN: [id number]  Chief Complaint  Patient preent with   Annual Exam    AWV & 3 mo lab reult    No new complaint, here for AWV refer to quality metric and canned document.   Lab reviewed and notable for anemia, lipid and cmp canceled due to inufficient lab quality.    No other concern at thi time.   Pat Medical Hitory:  Diagnoi Date   Atrial flutter (HCC)    a. /p TEE/DCCV 04/2017; b. 06/2017 /p RFCA; c. CHADS2VASc => 5 (CHF, HTN, age x 1, CVA)-->noncompliant with Xarelto.   Chronic combined ytolic and diatolic CHF (congetive heart failure) (HCC)    a. TTE 2/19: EF 20-25%, diffue HK; b. TTE 3/19: EF 20-25%, diffue HK; c. 08/2017 Echo: EF 35-40%, diff HK. Gr1 DD; d. 06/2018 Echo: EF 40-45%, DD. Neg bubble tudy.   CKD (chronic kidney dieae), tage II-III    Eential hypertenion    GI bleed    a.  Hemorrhagic hock in 11/19 econdary to eroive gatropathy and Barrett' eophagu requiring multiple unit PRBC; b. Cleared to reume OAC->pt did not.   NICM (nonichemic cardiomyopathy) (HCC)    a. 04/2017: Echo EF 20-25%; b. TTE 3/19: EF 20-25%; c. 07/2017 Cath: min irreg; d. 08/2017 Echo: EF 35-40%, diff HK; e. 06/2018 Echo: EF 40-45%, DD.   Noncompliance with medication    PAF (paroxymal atrial fibrillation) (HCC)    a. 07/2017 afib->broke w/ IV amio->converted to oral bb due to prior intol to oral amio.   Pulmonary hypertenion (HCC)    Stroke (HCC)    a. 06/2018 MRI/A Brain: R paramedian pon late acute/early ubacute infarct, 22mm. No aoc hemorrhage or ma effect.  Sev chronic microvacular ich changte and mod voluem lo. No large veel occluion, aneurym, or ignificant tenoi.    Pat Surgical Hitory:  Procedure Laterality Date   A-FLUTTER ABLATION N/A 06/16/2017   Procedure: A-FLUTTER ABLATION;  Surgeon: Marinu Maw, MD;   Location: [facility] INVASIVE CV LAB;  Service: Cardiovacular;  Laterality: N/A;   CARDIAC CATHETERIZATION     CARDIOVERSION N/A 05/01/2017   Procedure: CARDIOVERSION;  Surgeon: Antonieta Iba, MD;  Location: [facility] ORS;  Service: Cardiovacular;  Laterality: N/A;   COLONOSCOPY WITH PROPOFOL N/A 04/07/2020   Procedure: COLONOSCOPY WITH PROPOFOL;  Surgeon: Regi Bill, MD;  Location: [facility] ENDOSCOPY;  Service: Endocopy;  Laterality: N/A;   CORONARY ANGIOPLASTY     ESOPHAGOGASTRODUODENOSCOPY N/A 04/07/2020   Procedure: ESOPHAGOGASTRODUODENOSCOPY (EGD);  Surgeon: Regi Bill, MD;  Location: [facility] ENDOSCOPY;  Service: Endocopy;  Laterality: N/A;   ESOPHAGOGASTRODUODENOSCOPY (EGD) WITH PROPOFOL N/A 02/09/2018   Procedure: ESOPHAGOGASTRODUODENOSCOPY (EGD) WITH PROPOFOL;  Surgeon: Paty Spiller, MD;  Location: [facility] ENDOSCOPY;  Service: Endocopy;  Laterality: N/A;   RIGHT/LEFT HEART CATH AND CORONARY ANGIOGRAPHY N/A 07/18/2017   Procedure: RIGHT/LEFT HEART CATH AND CORONARY ANGIOGRAPHY;  Surgeon: Iran Ouch, MD;  Location: [facility] INVASIVE CV LAB;  Service: Cardiovacular;  Laterality: N/A;   TEE WITHOUT CARDIOVERSION N/A 05/01/2017   Procedure: TRANSESOPHAGEAL ECHOCARDIOGRAM (TEE);  Surgeon: Antonieta Iba, MD;  Location: [facility] ORS;  Service: Cardiovacular;  Laterality: N/A;   TEE WITHOUT CARDIOVERSION N/A 06/16/2017   Procedure: TRANSESOPHAGEAL ECHOCARDIOGRAM (TEE);  Surgeon: Lar Maon, MD;  Location: [facility] ENDOSCOPY;  Service: Cardiovacular;  Laterality: N/A;    Social Hitory   Socioeconomic Hitory **Note De-Identified via Obfucation** Marital tatu: Single    Spoue name: Not on file   Number of children: 1   Year of education: Not on file   Highet education level: Not on file  Occupational Hitory   Occupation: Cook    Comment: [employer]  Tobacco Ue   Smoking tatu: Former    Current pack/day: 0.00    Average pack/day: 1 pack/day for 3.0 year (3.0 ttl pk-yr)    Type: Cigarette     Start date: 04/21/2014    Quit date: 04/21/2017    Year ince quitting: 5.5   Smokele tobacco: Never  Vaping Ue   Vaping tatu: Never Ued  Subtance and Sexual Activity   Alcohol ue: Ye    Alcohol/week: 3.0 tandard drink of alcohol    Type: 3 Can of beer per week    Comment: Drink a half of a 40oz beer every day, drank heavily in the pat   Drug ue: Never   Sexual activity: Ye    Partner: Female  Other Topic Concern   Not on file  Social Hitory Narrative   Not on file   Social Determinant of Health   Financial Reource Strain: Low Rik  (06/15/2017)   Overall Financial Reource Strain (CARDIA)    Difficulty of Paying Living Expene: Not hard at all  Food Inecurity: No Food Inecurity (10/05/2021)   Hunger Vital Sign    Worried About Running Out of Food in the Lat Year: Never true    Ran Out of Food in the Lat Year: Never true  Tranportation Need: Unmet Tranportation Need (10/05/2021)   PRAPARE - Tranportation    Lack of Tranportation (Medical): Ye    Lack of Tranportation (Non-Medical): Ye  Phyical Activity: Inufficiently Active (06/15/2017)   Exercie Vital Sign    Day of Exercie per Week: 7 day    Minute of Exercie per Seion: 20 min  Stre: No Stre Concern Preent (06/15/2017)   [employer] - Occupational Stre Quetionnaire    Feeling of Stre : Not at all  Social Connection: Somewhat Iolated (06/15/2017)   Social Connection and Iolation Panel [NHANES]    Frequency of Communication with Friend and Family: More than three time a week    Frequency of Social Gathering with Friend and Family: Once a week    Attend Religiou Service: 1 to 4 time per year    Active Member of [employer] or Organization: No    Attend Banker Meeting: Never    Marital Statu: Divorced  Catering manager Violence: Not At Rik (06/15/2017)   Humiliation, Afraid, Rape, and Kick quetionnaire    Fear of Current  or Ex-Partner: No    Emotionally Abued: No    Phyically Abued: No    Sexually Abued: No    Family Hitory  Problem Relation Age of Onet   Alzheimer' dieae Mother    Alzheimer' dieae Father     No Known Allergie  Review of Sytem  Contitutional: Negative.   HENT: Negative.    Eye: Negative.   Repiratory: Negative.    Cardiovacular: Negative.   Gatrointetinal: Negative.   Genitourinary: Negative.   Skin: Negative.   Neurological: Negative.   Endo/Heme/Allergie: Negative.   Pychiatric/Behavioral: Negative.         Objective:   BP 110/70   Pule 68   Ht 6\' 5"  (1.956 m)   Wt 238 lb (108 kg)   SpO2 98%   BMI 28.22 kg/m   Vital: **Note De-Identified via Obfucation** 10/21/22 0950  BP: 110/70  Pule: 68  Height: 6\' 5"  (1.956 m)  Weight: 238 lb (108 kg)  SpO2: 98%  BMI (Calculated): 28.22    Phyical Exam Vital reviewed.  Contitutional:      Appearance: Normal appearance.  HENT:     Head: Normocephalic.     Left Ear: There i no impacted cerumen.     Noe: Noe normal.     Mouth/Throat:     Mouth: Mucou membrane are moit.     Pharynx: No poterior oropharyngeal erythema.  Eye:     Extraocular Movement: Extraocular movement intact.     Pupil: Pupil are equal, round, and reactive to light.  Cardiovacular:     Rate and Rhythm: Regular rhythm.     Chet Wall: PMI i not diplaced.     Pule: Normal pule.     Heart ound: Normal heart ound. No murmur heard. Pulmonary:     Effort: Pulmonary effort i normal.     Breath ound: Normal air entry. No rhonchi or rale.  Abdominal:     General: Abdomen i flat. Bowel ound are normal. There i no ditenion.     Palpation: Abdomen i oft. There i no hepatomegaly, plenomegaly or ma.     Tenderne: There i no abdominal tenderne.  Muculokeletal:        General: Normal range of motion.     Cervical back: Normal range of motion and neck upple.     Right lower leg: No edema.     Left lower leg:  No edema.  Skin:    General: Skin i warm and dry.  Neurological:     General: No focal deficit preent.     Mental Statu: He i alert and oriented to peron, place, and time.     Cranial Nerve: Cranial nerve deficit preent.     Motor: Weakne (left hemiparei) preent.     Gait: Gait abnormal (cior gait).  Pychiatric:        Mood and Affect: Mood normal.        Behavior: Behavior normal.      No reult found for any viit on 10/21/22.  Recent Reult (from the pat 2160 hour())  Lipid panel     Statu: None   Collection Time: 10/09/22  9:44 AM  Reult Value Ref Range   Choleterol, Total CANCELED mg/dL    Comment: [facility] wa unable to collect ufficient pecimen to perform the following tet(), and i providing the patient with re-collection intruction.  Reult canceled by the ancillary.    Triglyceride CANCELED mg/dL    Comment: [facility] wa unable to collect ufficient pecimen to perform the following tet(), and i providing the patient with re-collection intruction.  Reult canceled by the ancillary.    HDL CANCELED mg/dL    Comment: [facility] wa unable to collect ufficient pecimen to perform the following tet(), and i providing the patient with re-collection intruction.  Reult canceled by the ancillary.    VLDL Choleterol Cal CANCELED mg/dL    Comment: Unable to calculate reult ince non-numeric reult obtained for component tet.  Reult canceled by the ancillary.    LDL Chol Calc (NIH) CANCELED mg/dL    Comment: Unable to calculate reult ince non-numeric reult obtained for component tet.  Reult canceled by the ancillary.    LDL CALC COMMENT: CANCELED     Comment: Unable to calculate reult ince non-numeric reult obtained for component tet.  Reult canceled by the ancillary. **Note De-Identified via Obfucation** Chol/HDL Ratio CANCELED ratio    Comment: Unable to calculate reult ince non-numeric reult obtained for component tet.                                    T. Chol/HDL Ratio                                             Men  Women                               1/2 Avg.Rik  3.4    3.3                                   Avg.Rik  5.0    4.4                                2X Avg.Rik  9.6    7.1                                3X Avg.Rik 23.4   11.0  Reult canceled by the ancillary.   Comprehenive metabolic panel     Statu: None   Collection Time: 10/09/22  9:44 AM  Reult Value Ref Range   Glucoe CANCELED mg/dL    Comment: [facility] wa unable to collect ufficient pecimen to perform the following tet(), and i providing the patient with re-collection intruction.  Reult canceled by the ancillary.    BUN CANCELED mg/dL    Comment: [facility] wa unable to collect ufficient pecimen to perform the following tet(), and i providing the patient with re-collection intruction.  Reult canceled by the ancillary.    Creatinine, Ser CANCELED mg/dL    Comment: [facility] wa unable to collect ufficient pecimen to perform the following tet(), and i providing the patient with re-collection intruction.  Reult canceled by the ancillary.    BUN/Creatinine Ratio CANCELED     Comment: Unable to calculate reult ince non-numeric reult obtained for component tet.  Reult canceled by the ancillary.    Sodium CANCELED mmol/L    Comment: [facility] wa unable to collect ufficient pecimen to perform the following tet(), and i providing the patient with re-collection intruction.  Reult canceled by the ancillary.    Potaium CANCELED mmol/L    Comment: [facility] wa unable to collect ufficient pecimen to perform the following tet(), and i providing the patient with re-collection intruction.  Reult canceled by the ancillary.    Chloride CANCELED mmol/L    Comment: [facility] wa unable to collect ufficient pecimen to perform the following tet(), and i providing the patient with  re-collection intruction.  Reult canceled by the ancillary.    CO2 CANCELED mmol/L    Comment: [facility] wa unable to collect ufficient pecimen to perform the following tet(), and i providing the patient with re-collection intruction.  Reult canceled by the ancillary.    Calcium CANCELED mg/dL    Comment: [facility] wa unable to collect ufficient pecimen to perform the following tet(), and i providing the patient with re-collection intruction. **Note De-Identified via Obfucation** Reult canceled by the ancillary.    Total Protein CANCELED g/dL    Comment: [facility] wa unable to collect ufficient pecimen to perform the following tet(), and i providing the patient with re-collection intruction.  Reult canceled by the ancillary.    Albumin CANCELED g/dL    Comment: [facility] wa unable to collect ufficient pecimen to perform the following tet(), and i providing the patient with re-collection intruction.  Reult canceled by the ancillary.    Globulin, Total CANCELED g/dL    Comment: Unable to calculate reult ince non-numeric reult obtained for component tet.  Reult canceled by the ancillary.    Bilirubin Total CANCELED mg/dL    Comment: [facility] wa unable to collect ufficient pecimen to perform the following tet(), and i providing the patient with re-collection intruction.  Reult canceled by the ancillary.    Alkaline Phophatae CANCELED IU/L    Comment: [facility] wa unable to collect ufficient pecimen to perform the following tet(), and i providing the patient with re-collection intruction.  Reult canceled by the ancillary.    AST CANCELED IU/L    Comment: [facility] wa unable to collect ufficient pecimen to perform the following tet(), and i providing the patient with re-collection intruction.  Reult canceled by the ancillary.    ALT CANCELED IU/L    Comment: [facility] wa unable to collect ufficient pecimen to perform the following  tet(), and i providing the patient with re-collection intruction.  Reult canceled by the ancillary.   CBC With Diff/Platelet     Statu: Abnormal   Collection Time: 10/09/22  9:44 AM  Reult Value Ref Range   WBC 4.8 3.4 - 10.8 x10E3/uL   RBC 4.27 4.14 - 5.80 x10E6/uL   Hemoglobin 11.9 (L) 13.0 - 17.7 g/dL   Hematocrit 16.1 (L) 09.6 - 51.0 %   MCV 84 79 - 97 fL   MCH 27.9 26.6 - 33.0 pg   MCHC 33.3 31.5 - 35.7 g/dL   RDW 04.5 40.9 - 81.1 %   Platelet 284 150 - 450 x10E3/uL   Neutrophil 54 Not Etab. %   Lymph 31 Not Etab. %   Monocyte 11 Not Etab. %   Eo 3 Not Etab. %   Bao 1 Not Etab. %   Neutrophil Abolute 2.6 1.4 - 7.0 x10E3/uL   Lymphocyte Abolute 1.5 0.7 - 3.1 x10E3/uL   Monocyte Abolute 0.5 0.1 - 0.9 x10E3/uL   EOS (ABSOLUTE) 0.1 0.0 - 0.4 x10E3/uL   Baophil Abolute 0.0 0.0 - 0.2 x10E3/uL   Immature Granulocyte 0 Not Etab. %   Immature Gran (Ab) 0.0 0.0 - 0.1 x10E3/uL      Aement & Plan:   A per problem lit  Problem Lit Item Addreed Thi Viit       Cardiovacular and Mediatinum   HTN (hypertenion) (Chronic)   CVA (cerebral vacular accident) (HCC)     Genitourinary   CKD (chronic kidney dieae), tage IIIa     Other   HLD (hyperlipidemia) - Primary    No follow-up on file.   Total time pent: 45 minute  Luna Fue, MD  10/21/2022   Thi document may have been prepared by [facility] Voice Recognition oftware and a uch may include unintentional dictation error.

## 2022-11-09 DIAGNOSIS — I639 Cerebral infarction, unspecified: Secondary | ICD-10-CM | POA: Diagnosis not present

## 2022-11-28 ENCOUNTER — Ambulatory Visit
Admission: RE | Admit: 2022-11-28 | Discharge: 2022-11-28 | Disposition: A | Discharge status: Home / Self Care | Payer: 59 | Source: Ambulatory Visit | Attending: Family | Admitting: Family

## 2022-11-28 DIAGNOSIS — Z8673 Personal history of transient ischemic attack (TIA), and cerebral infarction without residual deficits: Secondary | ICD-10-CM | POA: Diagnosis not present

## 2022-11-28 DIAGNOSIS — I272 Pulmonary hypertension, unspecified: Secondary | ICD-10-CM | POA: Diagnosis not present

## 2022-11-28 DIAGNOSIS — I428 Other cardiomyopathies: Secondary | ICD-10-CM | POA: Diagnosis not present

## 2022-11-28 DIAGNOSIS — I4892 Unspecified atrial flutter: Secondary | ICD-10-CM | POA: Diagnosis not present

## 2022-11-28 DIAGNOSIS — I5022 Chronic systolic (congestive) heart failure: Secondary | ICD-10-CM | POA: Diagnosis not present

## 2022-11-28 LAB — ECHOCARDIOGRAM COMPLETE
AR max vel: 3.06 cm2
AV Area VTI: 3.13 cm2
AV Area mean vel: 3.23 cm2
AV Mean grad: 2 mmHg
AV Peak grad: 3.7 mmHg
Ao pk vel: 0.97 m/s
Area-P 1/2: 3 cm2
S' Lateral: 3.5 cm

## 2022-11-28 NOTE — Progress Notes (Signed)
*  PRELIMINARY RESULTS* Echocardiogram 2D Echocardiogram has been performed.  Nathan [name] 11/28/2022, 10:20 AM

## 2022-11-29 ENCOUNTER — Telehealth: Payer: Self-pay

## 2022-11-29 NOTE — Telephone Encounter (Addendum)
  Pt aware, agreeable, and verbalized understanding  Advised pt to keep a bp log and bring to next appt. 03/26/23 @1000   ----- Message from Delma Freeze sent at 11/28/2022  3:17 PM EDT ----- Heart function has improved to 60-65% from 2 years ago. Heart muscle is mildly stiff when it relaxes so we need to keep making sure your blood pressure is under control.

## 2022-12-10 DIAGNOSIS — I639 Cerebral infarction, unspecified: Secondary | ICD-10-CM | POA: Diagnosis not present

## 2023-01-09 DIAGNOSIS — I639 Cerebral infarction, unspecified: Secondary | ICD-10-CM | POA: Diagnosis not present

## 2023-02-09 DIAGNOSIS — I639 Cerebral infarction, unspecified: Secondary | ICD-10-CM | POA: Diagnosis not present

## 2023-02-12 ENCOUNTER — Other Ambulatory Visit: Payer: 59

## 2023-02-12 ENCOUNTER — Other Ambulatory Visit: Payer: Self-pay

## 2023-02-12 DIAGNOSIS — N183 Chronic kidney disease, stage 3 unspecified: Secondary | ICD-10-CM | POA: Diagnosis not present

## 2023-02-12 DIAGNOSIS — N400 Enlarged prostate without lower urinary tract symptoms: Secondary | ICD-10-CM

## 2023-02-12 DIAGNOSIS — D631 Anemia in chronic kidney disease: Secondary | ICD-10-CM | POA: Diagnosis not present

## 2023-02-12 DIAGNOSIS — E782 Mixed hyperlipidemia: Secondary | ICD-10-CM | POA: Diagnosis not present

## 2023-02-13 LAB — LIPID PANEL
Chol/HDL Ratio: 2.3 {ratio} (ref 0.0–5.0)
Cholesterol, Total: 141 mg/dL (ref 100–199)
HDL: 62 mg/dL (ref >39)
LDL Chol Calc (NIH): 63 mg/dL (ref 0–99)
Triglycerides: 82 mg/dL (ref 0–149)
VLDL Cholesterol Cal: 16 mg/dL (ref 5–40)

## 2023-02-13 LAB — COMPREHENSIVE METABOLIC PANEL
ALT: 12 [IU]/L (ref 0–44)
AST: 16 [IU]/L (ref 0–40)
Albumin: 4 g/dL (ref 3.8–4.8)
Alkaline Phosphatase: 64 [IU]/L (ref 44–121)
BUN/Creatinine Ratio: 11 (ref 10–24)
BUN: 15 mg/dL (ref 8–27)
Bilirubin Total: 0.5 mg/dL (ref 0.0–1.2)
CO2: 20 mmol/L (ref 20–29)
Calcium: 9.9 mg/dL (ref 8.6–10.2)
Chloride: 102 mmol/L (ref 96–106)
Creatinine, Ser: 1.33 mg/dL — ABNORMAL HIGH (ref 0.76–1.27)
Globulin, Total: 3.8 g/dL (ref 1.5–4.5)
Glucose: 81 mg/dL (ref 70–99)
Potassium: 4.7 mmol/L (ref 3.5–5.2)
Sodium: 137 mmol/L (ref 134–144)
Total Protein: 7.8 g/dL (ref 6.0–8.5)
eGFR: 57 mL/min/{1.73_m2} — ABNORMAL LOW (ref >59)

## 2023-02-13 LAB — CBC WITH DIFF/PLATELET
Basophils Absolute: 0 10*3/uL (ref 0.0–0.2)
Basos: 1 %
EOS (ABSOLUTE): 0.2 10*3/uL (ref 0.0–0.4)
Eos: 3 %
Hematocrit: 35.4 % — ABNORMAL LOW (ref 37.5–51.0)
Hemoglobin: 11.9 g/dL — ABNORMAL LOW (ref 13.0–17.7)
Immature Grans (Abs): 0 10*3/uL (ref 0.0–0.1)
Immature Granulocytes: 0 %
Lymphocytes Absolute: 1.5 10*3/uL (ref 0.7–3.1)
Lymphs: 27 %
MCH: 28.2 pg (ref 26.6–33.0)
MCHC: 33.6 g/dL (ref 31.5–35.7)
MCV: 84 fL (ref 79–97)
Monocytes Absolute: 0.6 10*3/uL (ref 0.1–0.9)
Monocytes: 11 %
Neutrophils Absolute: 3.1 10*3/uL (ref 1.4–7.0)
Neutrophils: 58 %
Platelets: 291 10*3/uL (ref 150–450)
RBC: 4.22 x10E6/uL (ref 4.14–5.80)
RDW: 13.3 % (ref 11.6–15.4)
WBC: 5.4 10*3/uL (ref 3.4–10.8)

## 2023-02-13 LAB — VITAMIN B12: Vitamin B-12: 245 pg/mL (ref 232–1245)

## 2023-02-13 LAB — IRON,TIBC AND FERRITIN PANEL
Ferritin: 206 ng/mL (ref 30–400)
Iron Saturation: 30 % (ref 15–55)
Iron: 72 ug/dL (ref 38–169)
Total Iron Binding Capacity: 244 ug/dL — ABNORMAL LOW (ref 250–450)
UIBC: 172 ug/dL (ref 111–343)

## 2023-02-13 LAB — PSA: Prostate Specific Ag, Serum: 67.5 ng/mL — ABNORMAL HIGH (ref 0.0–4.0)

## 2023-02-13 LAB — FOLATE: Folate: 10.4 ng/mL (ref >3.0)

## 2023-02-25 ENCOUNTER — Encounter: Payer: Self-pay | Admitting: Internal Medicine

## 2023-02-25 ENCOUNTER — Ambulatory Visit (INDEPENDENT_AMBULATORY_CARE_PROVIDER_SITE_OTHER): Payer: 59 | Admitting: Internal Medicine

## 2023-02-25 VITALS — BP 134/70 | HR 73 | Ht 77.0 in | Wt 238.0 lb

## 2023-02-25 DIAGNOSIS — E782 Mixed hyperlipidemia: Secondary | ICD-10-CM | POA: Diagnosis not present

## 2023-02-25 DIAGNOSIS — Z0130 Encounter for examination of blood pressure without abnormal findings: Secondary | ICD-10-CM

## 2023-02-25 DIAGNOSIS — G8929 Other chronic pain: Secondary | ICD-10-CM

## 2023-02-25 DIAGNOSIS — R972 Elevated prostate specific antigen [PSA]: Secondary | ICD-10-CM | POA: Diagnosis not present

## 2023-02-25 DIAGNOSIS — N410 Acute prostatitis: Secondary | ICD-10-CM | POA: Diagnosis not present

## 2023-02-25 DIAGNOSIS — J208 Acute bronchitis due to other specified organisms: Secondary | ICD-10-CM

## 2023-02-25 DIAGNOSIS — M545 Low back pain, unspecified: Secondary | ICD-10-CM

## 2023-02-25 DIAGNOSIS — N1831 Chronic kidney disease, stage 3a: Secondary | ICD-10-CM

## 2023-02-25 MED ORDER — TIZANIDINE HCL 2 MG PO TABS: 2.0000 mg | ORAL_TABLET | Freq: Three times a day (TID) | ORAL | 2 refills | Status: DC

## 2023-02-25 MED ORDER — SULFAMETHOXAZOLE-TRIMETHOPRIM 800-160 MG PO TABS: 1.0000 | ORAL_TABLET | Freq: Two times a day (BID) | ORAL | 0 refills | Status: AC

## 2023-02-25 MED ORDER — METHYLPREDNISOLONE 4 MG PO TBPK: ORAL_TABLET | ORAL | 0 refills | Status: DC

## 2023-02-25 NOTE — Progress Notes (Signed)
Established Patient Office Visit  Subjective:  Patient ID: Nathan Wenner., male    DOB: [birth year]  Age: 72 y.o. MRN: [id number]  Chief Complaint  Patient presents with   Follow-up    4 month follow up, discuss lab results.    No new complaints, here for lab review and medication refills. Labs reviewed and notable for elevated PSA, stable renal function and hemoglobin. LDL and TC well controlled on lab review. Triglycerides also satisfactory.  No other concerns at this time.   Past Medical History:  Diagnosis Date   Atrial flutter (HCC)    a. Mical Brun/p TEE/DCCV 04/2017; b. 06/2017 Allard Lightsey/p RFCA; c. CHADS2VASc => 5 (CHF, HTN, age x 1, CVA)-->noncompliant with Xarelto.   Chronic combined systolic and diastolic CHF (congestive heart failure) (HCC)    a. TTE 2/19: EF 20-25%, diffuse HK; b. TTE 3/19: EF 20-25%, diffuse HK; c. 08/2017 Echo: EF 35-40%, diff HK. Gr1 DD; d. 06/2018 Echo: EF 40-45%, DD. Neg bubble study.   CKD (chronic kidney disease), stage II-III    Essential hypertension    GI bleed    a.  Hemorrhagic shock in 11/19 secondary to erosive gastropathy and Barrett'Dejon Lukas esophagus requiring multiple units PRBCs; b. Cleared to resume OAC->pt did not.   NICM (nonischemic cardiomyopathy) (HCC)    a. 04/2017: Echo EF 20-25%; b. TTE 3/19: EF 20-25%; c. 07/2017 Cath: min irregs; d. 08/2017 Echo: EF 35-40%, diff HK; e. 06/2018 Echo: EF 40-45%, DD.   Noncompliance with medications    PAF (paroxysmal atrial fibrillation) (HCC)    a. 07/2017 afib->broke w/ IV amio->converted to oral bb due to prior intol to oral amio.   Pulmonary hypertension (HCC)    Stroke (HCC)    a. 06/2018 MRI/A Brain: R paramedian pons late acute/early subacute infarct, 22mm. No assoc hemorrhage or mass effect.  Sev chronic microvascular isch changtes and mod voluem loss. No large vessel occlusion, aneurysm, or significant stenosis.    Past Surgical History:  Procedure Laterality Date   A-FLUTTER ABLATION N/A 06/16/2017   Procedure:  A-FLUTTER ABLATION;  Surgeon: Marinus Maw, MD;  Location: [facility] INVASIVE CV LAB;  Service: Cardiovascular;  Laterality: N/A;   CARDIAC CATHETERIZATION     CARDIOVERSION N/A 05/01/2017   Procedure: CARDIOVERSION;  Surgeon: Antonieta Iba, MD;  Location: [facility] ORS;  Service: Cardiovascular;  Laterality: N/A;   COLONOSCOPY WITH PROPOFOL N/A 04/07/2020   Procedure: COLONOSCOPY WITH PROPOFOL;  Surgeon: Regis Bill, MD;  Location: [facility] ENDOSCOPY;  Service: Endoscopy;  Laterality: N/A;   CORONARY ANGIOPLASTY     ESOPHAGOGASTRODUODENOSCOPY N/A 04/07/2020   Procedure: ESOPHAGOGASTRODUODENOSCOPY (EGD);  Surgeon: Regis Bill, MD;  Location: [facility] ENDOSCOPY;  Service: Endoscopy;  Laterality: N/A;   ESOPHAGOGASTRODUODENOSCOPY (EGD) WITH PROPOFOL N/A 02/09/2018   Procedure: ESOPHAGOGASTRODUODENOSCOPY (EGD) WITH PROPOFOL;  Surgeon: Pasty Spillers, MD;  Location: [facility] ENDOSCOPY;  Service: Endoscopy;  Laterality: N/A;   RIGHT/LEFT HEART CATH AND CORONARY ANGIOGRAPHY N/A 07/18/2017   Procedure: RIGHT/LEFT HEART CATH AND CORONARY ANGIOGRAPHY;  Surgeon: Iran Ouch, MD;  Location: [facility] INVASIVE CV LAB;  Service: Cardiovascular;  Laterality: N/A;   TEE WITHOUT CARDIOVERSION N/A 05/01/2017   Procedure: TRANSESOPHAGEAL ECHOCARDIOGRAM (TEE);  Surgeon: Antonieta Iba, MD;  Location: [facility] ORS;  Service: Cardiovascular;  Laterality: N/A;   TEE WITHOUT CARDIOVERSION N/A 06/16/2017   Procedure: TRANSESOPHAGEAL ECHOCARDIOGRAM (TEE);  Surgeon: Lars Masson, MD;  Location: [facility] ENDOSCOPY;  Service: Cardiovascular;  Laterality: N/A;    Social History   Socioeconomic  History   Marital status: Single    Spouse name: Not on file   Number of children: 1   Years of education: Not on file   Highest education level: Not on file  Occupational History   Occupation: Cook    Comment: [employer]  Tobacco Use   Smoking status: Former    Current packs/day: 0.00    Average packs/day: 1 pack/day for  3.0 years (3.0 ttl pk-yrs)    Types: Cigarettes    Start date: 04/21/2014    Quit date: 04/21/2017    Years since quitting: 5.8   Smokeless tobacco: Never  Vaping Use   Vaping status: Never Used  Substance and Sexual Activity   Alcohol use: Yes    Alcohol/week: 3.0 standard drinks of alcohol    Types: 3 Cans of beer per week    Comment: Drink a half of a 40oz beer every day, drank heavily in the past   Drug use: Never   Sexual activity: Yes    Partners: Female  Other Topics Concern   Not on file  Social History Narrative   Not on file   Social Determinants of Health   Financial Resource Strain: Low Risk  (06/15/2017)   Overall Financial Resource Strain (CARDIA)    Difficulty of Paying Living Expenses: Not hard at all  Food Insecurity: No Food Insecurity (10/05/2021)   Hunger Vital Sign    Worried About Running Out of Food in the Last Year: Never true    Ran Out of Food in the Last Year: Never true  Transportation Needs: Unmet Transportation Needs (10/05/2021)   PRAPARE - Transportation    Lack of Transportation (Medical): Yes    Lack of Transportation (Non-Medical): Yes  Physical Activity: Insufficiently Active (06/15/2017)   Exercise Vital Sign    Days of Exercise per Week: 7 days    Minutes of Exercise per Session: 20 min  Stress: No Stress Concern Present (06/15/2017)   [employer] of Occupational Health - Occupational Stress Questionnaire    Feeling of Stress : Not at all  Social Connections: Somewhat Isolated (06/15/2017)   Social Connection and Isolation Panel [NHANES]    Frequency of Communication with Friends and Family: More than three times a week    Frequency of Social Gatherings with Friends and Family: Once a week    Attends Religious Services: 1 to 4 times per year    Active Member of [employer] or Organizations: No    Attends Banker Meetings: Never    Marital Status: Divorced  Catering manager Violence: Not At Risk (06/15/2017)   Humiliation,  Afraid, Rape, and Kick questionnaire    Fear of Current or Ex-Partner: No    Emotionally Abused: No    Physically Abused: No    Sexually Abused: No    Family History  Problem Relation Age of Onset   Alzheimer'Mckinley Adelstein disease Mother    Alzheimer'Ashaz Robling disease Father     No Known Allergies  Outpatient Medications Prior to Visit  Medication Sig   acetaminophen (TYLENOL) 500 MG tablet Take 500-1,000 mg by mouth 2 (two) times daily as needed for mild pain or moderate pain.   atorvastatin (LIPITOR) 80 MG tablet Take 1 tablet (80 mg total) by mouth daily.   metoprolol succinate (TOPROL-XL) 25 MG 24 hr tablet Take 1 tablet (25 mg total) by mouth daily.   sacubitril-valsartan (ENTRESTO) 24-26 MG Take 1 tablet by mouth 2 (two) times daily.   spironolactone (ALDACTONE) 25 MG  tablet Take 1 tablet (25 mg total) by mouth daily.   [DISCONTINUED] tiZANidine (ZANAFLEX) 2 MG tablet Take 1 tablet (2 mg total) by mouth 3 (three) times daily.   No facility-administered medications prior to visit.    Review of Systems  Constitutional: Negative.   HENT: Negative.    Eyes: Negative.   Respiratory: Negative.    Cardiovascular: Negative.   Gastrointestinal: Negative.   Genitourinary: Negative.   Skin: Negative.   Neurological: Negative.   Endo/Heme/Allergies: Negative.   Psychiatric/Behavioral: Negative.         Objective:   BP 134/70   Pulse 73   Ht 6\' 5"  (1.956 m)   Wt 238 lb (108 kg)   SpO2 100%   BMI 28.22 kg/m   Vitals:   02/25/23 0926  BP: 134/70  Pulse: 73  Height: 6\' 5"  (1.956 m)  Weight: 238 lb (108 kg)  SpO2: 100%  BMI (Calculated): 28.22    Physical Exam Vitals reviewed.  Constitutional:      Appearance: Normal appearance.  HENT:     Head: Normocephalic.     Left Ear: There is no impacted cerumen.     Nose: Nose normal.     Mouth/Throat:     Mouth: Mucous membranes are moist.     Pharynx: No posterior oropharyngeal erythema.  Eyes:     Extraocular Movements:  Extraocular movements intact.     Pupils: Pupils are equal, round, and reactive to light.  Cardiovascular:     Rate and Rhythm: Regular rhythm.     Chest Wall: PMI is not displaced.     Pulses: Normal pulses.     Heart sounds: Normal heart sounds. No murmur heard. Pulmonary:     Effort: Pulmonary effort is normal.     Breath sounds: Normal air entry. Rhonchi present. No rales.  Abdominal:     General: Abdomen is flat. Bowel sounds are normal. There is no distension.     Palpations: Abdomen is soft. There is no hepatomegaly, splenomegaly or mass.     Tenderness: There is no abdominal tenderness.  Musculoskeletal:        General: Normal range of motion.     Cervical back: Normal range of motion and neck supple.     Right lower leg: No edema.     Left lower leg: No edema.  Skin:    General: Skin is warm and dry.  Neurological:     General: No focal deficit present.     Mental Status: He is alert and oriented to person, place, and time.     Cranial Nerves: Cranial nerve deficit present.     Motor: Weakness (left hemiparesis) present.     Gait: Gait abnormal (scissors gait).  Psychiatric:        Mood and Affect: Mood normal.        Behavior: Behavior normal.      No results found for any visits on 02/25/23.  Recent Results (from the past 2160 hour(Ronae Noell))  ECHOCARDIOGRAM COMPLETE     Status: None   Collection Time: 11/28/22 10:20 AM  Result Value Ref Range   Ao pk vel 0.97 m/Astraea Gaughran   AV Area VTI 3.13 cm2   AR max vel 3.06 cm2   AV Mean grad 2.0 mmHg   AV Peak grad 3.7 mmHg   Josuel Koeppen' Lateral 3.50 cm   AV Area mean vel 3.23 cm2   Area-P 1/2 3.00 cm2   Est EF 60 - 65%   PSA  Status: Abnormal   Collection Time: 02/12/23 10:03 AM  Result Value Ref Range   Prostate Specific Ag, Serum 67.5 (H) 0.0 - 4.0 ng/mL    Comment: Roche ECLIA methodology. According to the American Urological Association, Serum PSA should decrease and remain at undetectable levels after radical prostatectomy.  The AUA defines biochemical recurrence as an initial PSA value 0.2 ng/mL or greater followed by a subsequent confirmatory PSA value 0.2 ng/mL or greater. Values obtained with different assay methods or kits cannot be used interchangeably. Results cannot be interpreted as absolute evidence of the presence or absence of malignant disease.   Lipid panel     Status: None   Collection Time: 02/12/23 10:03 AM  Result Value Ref Range   Cholesterol, Total 141 100 - 199 mg/dL   Triglycerides 82 0 - 149 mg/dL   HDL 62 >25 mg/dL   VLDL Cholesterol Cal 16 5 - 40 mg/dL   LDL Chol Calc (NIH) 63 0 - 99 mg/dL   Chol/HDL Ratio 2.3 0.0 - 5.0 ratio    Comment:                                   T. Chol/HDL Ratio                                             Men  Women                               1/2 Avg.Risk  3.4    3.3                                   Avg.Risk  5.0    4.4                                2X Avg.Risk  9.6    7.1                                3X Avg.Risk 23.4   11.0   Comprehensive metabolic panel     Status: Abnormal   Collection Time: 02/12/23 10:03 AM  Result Value Ref Range   Glucose 81 70 - 99 mg/dL   BUN 15 8 - 27 mg/dL   Creatinine, Ser 9.56 (H) 0.76 - 1.27 mg/dL   eGFR 57 (L) >38 VF/IEP/3.29   BUN/Creatinine Ratio 11 10 - 24   Sodium 137 134 - 144 mmol/L   Potassium 4.7 3.5 - 5.2 mmol/L   Chloride 102 96 - 106 mmol/L   CO2 20 20 - 29 mmol/L   Calcium 9.9 8.6 - 10.2 mg/dL   Total Protein 7.8 6.0 - 8.5 g/dL   Albumin 4.0 3.8 - 4.8 g/dL   Globulin, Total 3.8 1.5 - 4.5 g/dL   Bilirubin Total 0.5 0.0 - 1.2 mg/dL   Alkaline Phosphatase 64 44 - 121 IU/L   AST 16 0 - 40 IU/L   ALT 12 0 - 44 IU/L  CBC With Diff/Platelet     Status: Abnormal   Collection Time:  02/12/23 10:03 AM  Result Value Ref Range   WBC 5.4 3.4 - 10.8 x10E3/uL    Comment: **Effective February 17, 2023 profile [id number] WBC will be made**   non-orderable as a stand-alone order code.    RBC 4.22 4.14 - 5.80  x10E6/uL   Hemoglobin 11.9 (L) 13.0 - 17.7 g/dL   Hematocrit 21.3 (L) 08.6 - 51.0 %   MCV 84 79 - 97 fL   MCH 28.2 26.6 - 33.0 pg   MCHC 33.6 31.5 - 35.7 g/dL   RDW 57.8 46.9 - 62.9 %   Platelets 291 150 - 450 x10E3/uL   Neutrophils 58 Not Estab. %   Lymphs 27 Not Estab. %   Monocytes 11 Not Estab. %   Eos 3 Not Estab. %   Basos 1 Not Estab. %   Neutrophils Absolute 3.1 1.4 - 7.0 x10E3/uL   Lymphocytes Absolute 1.5 0.7 - 3.1 x10E3/uL   Monocytes Absolute 0.6 0.1 - 0.9 x10E3/uL   EOS (ABSOLUTE) 0.2 0.0 - 0.4 x10E3/uL   Basophils Absolute 0.0 0.0 - 0.2 x10E3/uL   Immature Granulocytes 0 Not Estab. %   Immature Grans (Abs) 0.0 0.0 - 0.1 x10E3/uL  Vitamin B12     Status: None   Collection Time: 02/12/23 10:03 AM  Result Value Ref Range   Vitamin B-12 245 232 - 1,245 pg/mL  Iron, TIBC and Ferritin Panel     Status: Abnormal   Collection Time: 02/12/23 10:03 AM  Result Value Ref Range   Total Iron Binding Capacity 244 (L) 250 - 450 ug/dL   UIBC 528 413 - 244 ug/dL   Iron 72 38 - 010 ug/dL   Iron Saturation 30 15 - 55 %   Ferritin 206 30 - 400 ng/mL  Folate     Status: None   Collection Time: 02/12/23 10:03 AM  Result Value Ref Range   Folate 10.4 >3.0 ng/mL    Comment: A serum folate concentration of less than 3.1 ng/mL is considered to represent clinical deficiency.       Assessment & Plan:  As per problem list  Problem List Items Addressed This Visit       Genitourinary   CKD (chronic kidney disease), stage IIIa     Other   HLD (hyperlipidemia) - Primary   Relevant Orders   Comprehensive metabolic panel   Lipid panel   Chronic low back pain without sciatica   Relevant Medications   methylPREDNISolone (MEDROL DOSEPAK) 4 MG TBPK tablet   tiZANidine (ZANAFLEX) 2 MG tablet   Elevated PSA   Relevant Orders   Ambulatory referral to Urology   Other Visit Diagnoses     Acute prostatitis       Relevant Medications   sulfamethoxazole-trimethoprim (BACTRIM DS)  800-160 MG tablet   Acute bronchitis due to other specified organisms       Relevant Medications   sulfamethoxazole-trimethoprim (BACTRIM DS) 800-160 MG tablet   methylPREDNISolone (MEDROL DOSEPAK) 4 MG TBPK tablet       Return in about 3 weeks (around 03/18/2023) for Prostatitis follow up.   Total time spent: 30 minutes  Luna Fuse, MD  02/25/2023   This document may have been prepared by [facility] Voice Recognition software and as such may include unintentional dictation errors.

## 2023-03-25 ENCOUNTER — Ambulatory Visit: Payer: 59 | Admitting: Internal Medicine

## 2023-03-25 NOTE — Progress Notes (Deleted)
 PCP: Gillermo Murdoch, MD (last seen 12/24) Primary Cardiologist: Julien Nordmann, MD (last see 06/24)  Chief Complaint:  HPI:  Mr Nathan [name] is a 73 y/o male with a history of HTN, stroke, atrial flutter (04/2017), CKD, GIB, pulmonary HTN, previous tobacco use and chronic heart failure. Cardioverted 05/01/17 and had ablation 06/2017.   Echo 06/16/17: EF of 15-20% along with mod-severe TR and an elevated PA pressure of 45 mm Hg. Echo 09/04/17: EF of 35-40%.  Echo 07/01/2018: EF of 40-45%. Echo 08/29/20: EF is 55 %. The left ventricle has normal function. The left ventricle has no regional wall motion abnormalities. The left ventricular internal cavity size was normal in size. There is mild left ventricular hypertrophy. Left ventricular diastolic parameters are indeterminate.   Echo 11/28/22: EF 60-65% with mild LVH, Grade I DD, moderate LAE, mild MR  Cardiac catheterization done 07/18/17 and showed heavily calcified coronary arteries with mild nonobstructive disease.  Left dominant system. Right heart catheterization showed moderately to severely elevated filling pressures, severe pulmonary hypertension and severely reduced cardiac output.   Has not been admitted or been in the ED in the last 6 months.    He presents today for a HF visit with a chief complaint of a follow-up visit.  ROS: All systems negative except as listed in HPI, PMH and Problem List.  SH:  Social History   Socioeconomic History   Marital status: Single    Spouse name: Not on file   Number of children: 1   Years of education: Not on file   Highest education level: Not on file  Occupational History   Occupation: Cook    Comment: [employer]  Tobacco Use   Smoking status: Former    Current packs/day: 0.00    Average packs/day: 1 pack/day for 3.0 years (3.0 ttl pk-yrs)    Types: Cigarettes    Start date: 04/21/2014    Quit date: 04/21/2017    Years since quitting: 5.9   Smokeless tobacco: Never  Vaping Use   Vaping  status: Never Used  Substance and Sexual Activity   Alcohol use: Yes    Alcohol/week: 3.0 standard drinks of alcohol    Types: 3 Cans of beer per week    Comment: Drink a half of a 40oz beer every day, drank heavily in the past   Drug use: Never   Sexual activity: Yes    Partners: Female  Other Topics Concern   Not on file  Social History Narrative   Not on file   Social Drivers of Health   Financial Resource Strain: Low Risk  (06/15/2017)   Overall Financial Resource Strain (CARDIA)    Difficulty of Paying Living Expenses: Not hard at all  Food Insecurity: No Food Insecurity (10/05/2021)   Hunger Vital Sign    Worried About Running Out of Food in the Last Year: Never true    Ran Out of Food in the Last Year: Never true  Transportation Needs: Unmet Transportation Needs (10/05/2021)   PRAPARE - Transportation    Lack of Transportation (Medical): Yes    Lack of Transportation (Non-Medical): Yes  Physical Activity: Insufficiently Active (06/15/2017)   Exercise Vital Sign    Days of Exercise per Week: 7 days    Minutes of Exercise per Session: 20 min  Stress: No Stress Concern Present (06/15/2017)   [employer] of Occupational Health - Occupational Stress Questionnaire    Feeling of Stress : Not at all  Social Connections: Somewhat Isolated (06/15/2017)  Social Advertising account executive [NHANES]    Frequency of Communication with Friends and Family: More than three times a week    Frequency of Social Gatherings with Friends and Family: Once a week    Attends Religious Services: 1 to 4 times per year    Active Member of [employer] or Organizations: No    Attends Banker Meetings: Never    Marital Status: Divorced  Catering manager Violence: Not At Risk (06/15/2017)   Humiliation, Afraid, Rape, and Kick questionnaire    Fear of Current or Ex-Partner: No    Emotionally Abused: No    Physically Abused: No    Sexually Abused: No    FH:  Family History  Problem  Relation Age of Onset   Alzheimer's disease Mother    Alzheimer's disease Father     Past Medical History:  Diagnosis Date   Atrial flutter (HCC)    a. s/p TEE/DCCV 04/2017; b. 06/2017 s/p RFCA; c. CHADS2VASc => 5 (CHF, HTN, age x 1, CVA)-->noncompliant with Xarelto.   Chronic combined systolic and diastolic CHF (congestive heart failure) (HCC)    a. TTE 2/19: EF 20-25%, diffuse HK; b. TTE 3/19: EF 20-25%, diffuse HK; c. 08/2017 Echo: EF 35-40%, diff HK. Gr1 DD; d. 06/2018 Echo: EF 40-45%, DD. Neg bubble study.   CKD (chronic kidney disease), stage II-III    Essential hypertension    GI bleed    a.  Hemorrhagic shock in 11/19 secondary to erosive gastropathy and Barrett's esophagus requiring multiple units PRBCs; b. Cleared to resume OAC->pt did not.   NICM (nonischemic cardiomyopathy) (HCC)    a. 04/2017: Echo EF 20-25%; b. TTE 3/19: EF 20-25%; c. 07/2017 Cath: min irregs; d. 08/2017 Echo: EF 35-40%, diff HK; e. 06/2018 Echo: EF 40-45%, DD.   Noncompliance with medications    PAF (paroxysmal atrial fibrillation) (HCC)    a. 07/2017 afib->broke w/ IV amio->converted to oral bb due to prior intol to oral amio.   Pulmonary hypertension (HCC)    Stroke (HCC)    a. 06/2018 MRI/A Brain: R paramedian pons late acute/early subacute infarct, 22mm. No assoc hemorrhage or mass effect.  Sev chronic microvascular isch changtes and mod voluem loss. No large vessel occlusion, aneurysm, or significant stenosis.    Current Outpatient Medications  Medication Sig Dispense Refill   acetaminophen (TYLENOL) 500 MG tablet Take 500-1,000 mg by mouth 2 (two) times daily as needed for mild pain or moderate pain.     atorvastatin (LIPITOR) 80 MG tablet Take 1 tablet (80 mg total) by mouth daily. 90 tablet 3   methylPREDNISolone (MEDROL DOSEPAK) 4 MG TBPK tablet Use as directed 21 tablet 0   metoprolol succinate (TOPROL-XL) 25 MG 24 hr tablet Take 1 tablet (25 mg total) by mouth daily. 90 tablet 3   sacubitril-valsartan  (ENTRESTO) 24-26 MG Take 1 tablet by mouth 2 (two) times daily. 180 tablet 3   spironolactone (ALDACTONE) 25 MG tablet Take 1 tablet (25 mg total) by mouth daily. 90 tablet 3   tiZANidine (ZANAFLEX) 2 MG tablet Take 1 tablet (2 mg total) by mouth 3 (three) times daily. 90 tablet 2   No current facility-administered medications for this visit.     PHYSICAL EXAM:  General:  Well appearing. No resp difficulty HEENT: normal Neck: supple. JVP flat. No lymphadenopathy or thryomegaly appreciated. Cor: PMI normal. Regular rate & rhythm. No rubs, gallops or murmurs. Lungs: clear Abdomen: soft, nontender, nondistended. No hepatosplenomegaly. No bruits or masses.  Extremities: no cyanosis, clubbing, rash, edema Neuro: alert & oriented x3, cranial nerves grossly intact. Moves all 4 extremities w/o difficulty. Affect pleasant.   ECG: 08/20/22 showed NSR   ASSESSMENT & PLAN:  1: NICM with preserved ejection fraction- - likely due to HTN as cath showed nonobstructive CAD - NYHA class I - euvolemic today - weighing daily at home; reviewed the importance of calling for any overnight weight gain of >2 pounds or a weekly weight gain of >5 pounds - weight 237 pounds from last visit 6 months ago - Echo 08/29/20: EF is 55 %. The left ventricle has normal function. The left ventricle has no regional wall motion abnormalities. The left ventricular internal cavity size was normal in size. There is mild left ventricular hypertrophy. Left ventricular diastolic parameters are indeterminate.  - Echo 07/01/2018: EF of 40-45%.  - Echo 09/04/17: EF of 35-40%.  - Echo 06/16/17: EF of 15-20% along with mod-severe TR and an elevated PA pressure of 45 mm Hg. - Echo 11/28/22: EF 60-65% with mild LVH, Grade I DD, moderate LAE, mild MR - not adding salt to his food and tries to eat low sodium foods. Reminded to closely follow a 2000mg  sodium diet; using pepper for seasoning - continue metoprolol succinate 25mg  daily -  continue entresto 24/26mg  BID - continue spironolactone 25mg  daily - consider SGLT2 but he's not terribly interested in adding additional medication; will discuss further after getting echo results back - BNP on 04/04/20 was 138.8  2: HTN- - BP  - saw PCP (Tejan-Sie) 12/24 - BMP 02/12/23 showed sodium 137, potassium 4.7, creatinine 1.33 & GFR 57  3: Atrial fibrillation- - saw cardiology Mariah Milling) 06/24 - cardioverted 04/2017 - ablation done 06/2017 - declines watchman device - doesn't want blood thinners because he "almost bled out on eliquis"  4: Nonobstructive CAD- - Cardiac catheterization done 07/18/17 and showed heavily calcified coronary arteries with mild nonobstructive disease.  Left dominant system. Right heart catheterization showed moderately to severely elevated filling pressures, severe pulmonary hypertension and severely reduced cardiac output.

## 2023-03-26 ENCOUNTER — Encounter: Payer: 59 | Admitting: Family

## 2023-04-01 NOTE — Progress Notes (Deleted)
PCP: Gillermo Murdoch, MD (last seen 12/24) Primary Cardiologist: Julien Nordmann, MD (last see 06/24)  Chief Complaint:  HPI:  Nathan [name] is a 73 y/o male with a history of HTN, stroke, atrial flutter (04/2017), CKD, GIB, pulmonary HTN, previous tobacco use and chronic heart failure. Cardioverted 05/01/17 and had ablation 06/2017.   Echo 06/16/17: EF of 15-20% along with mod-severe TR and an elevated PA pressure of 45 mm Hg. Echo 09/04/17: EF of 35-40%.  Echo 07/01/2018: EF of 40-45%. Echo 08/29/20: EF is 55 %. The left ventricle has normal function. The left ventricle has no regional wall motion abnormalities. The left ventricular internal cavity size was normal in size. There is mild left ventricular hypertrophy. Left ventricular diastolic parameters are indeterminate.   Echo 11/28/22: EF 60-65% with mild LVH, Grade I DD, moderate LAE, mild Nathan  Cardiac catheterization done 07/18/17 and showed heavily calcified coronary arteries with mild nonobstructive disease.  Left dominant system. Right heart catheterization showed moderately to severely elevated filling pressures, severe pulmonary hypertension and severely reduced cardiac output.   Has not been admitted or been in the ED in the last 6 months.    He presents today for a HF visit with a chief complaint of a follow-up visit.  ROS: All systems negative except as listed in HPI, PMH and Problem List.  SH:  Social History   Socioeconomic History   Marital status: Single    Spouse name: Not on file   Number of children: 1   Years of education: Not on file   Highest education level: Not on file  Occupational History   Occupation: Cook    Comment: [employer]  Tobacco Use   Smoking status: Former    Current packs/day: 0.00    Average packs/day: 1 pack/day for 3.0 years (3.0 ttl pk-yrs)    Types: Cigarettes    Start date: 04/21/2014    Quit date: 04/21/2017    Years since quitting: 5.9   Smokeless tobacco: Never  Vaping Use   Vaping  status: Never Used  Substance and Sexual Activity   Alcohol use: Yes    Alcohol/week: 3.0 standard drinks of alcohol    Types: 3 Cans of beer per week    Comment: Drink a half of a 40oz beer every day, drank heavily in the past   Drug use: Never   Sexual activity: Yes    Partners: Female  Other Topics Concern   Not on file  Social History Narrative   Not on file   Social Drivers of Health   Financial Resource Strain: Low Risk  (06/15/2017)   Overall Financial Resource Strain (CARDIA)    Difficulty of Paying Living Expenses: Not hard at all  Food Insecurity: No Food Insecurity (10/05/2021)   Hunger Vital Sign    Worried About Running Out of Food in the Last Year: Never true    Ran Out of Food in the Last Year: Never true  Transportation Needs: Unmet Transportation Needs (10/05/2021)   PRAPARE - Transportation    Lack of Transportation (Medical): Yes    Lack of Transportation (Non-Medical): Yes  Physical Activity: Insufficiently Active (06/15/2017)   Exercise Vital Sign    Days of Exercise per Week: 7 days    Minutes of Exercise per Session: 20 min  Stress: No Stress Concern Present (06/15/2017)   [employer] of Occupational Health - Occupational Stress Questionnaire    Feeling of Stress : Not at all  Social Connections: Somewhat Isolated (06/15/2017)  Social Advertising account executive [NHANES]    Frequency of Communication with Friends and Family: More than three times a week    Frequency of Social Gatherings with Friends and Family: Once a week    Attends Religious Services: 1 to 4 times per year    Active Member of [employer] or Organizations: No    Attends Banker Meetings: Never    Marital Status: Divorced  Catering manager Violence: Not At Risk (06/15/2017)   Humiliation, Afraid, Rape, and Kick questionnaire    Fear of Current or Ex-Partner: No    Emotionally Abused: No    Physically Abused: No    Sexually Abused: No    FH:  Family History  Problem  Relation Age of Onset   Alzheimer's disease Mother    Alzheimer's disease Father     Past Medical History:  Diagnosis Date   Atrial flutter (HCC)    a. s/p TEE/DCCV 04/2017; b. 06/2017 s/p RFCA; c. CHADS2VASc => 5 (CHF, HTN, age x 1, CVA)-->noncompliant with Xarelto.   Chronic combined systolic and diastolic CHF (congestive heart failure) (HCC)    a. TTE 2/19: EF 20-25%, diffuse HK; b. TTE 3/19: EF 20-25%, diffuse HK; c. 08/2017 Echo: EF 35-40%, diff HK. Gr1 DD; d. 06/2018 Echo: EF 40-45%, DD. Neg bubble study.   CKD (chronic kidney disease), stage II-III    Essential hypertension    GI bleed    a.  Hemorrhagic shock in 11/19 secondary to erosive gastropathy and Barrett's esophagus requiring multiple units PRBCs; b. Cleared to resume OAC->pt did not.   NICM (nonischemic cardiomyopathy) (HCC)    a. 04/2017: Echo EF 20-25%; b. TTE 3/19: EF 20-25%; c. 07/2017 Cath: min irregs; d. 08/2017 Echo: EF 35-40%, diff HK; e. 06/2018 Echo: EF 40-45%, DD.   Noncompliance with medications    PAF (paroxysmal atrial fibrillation) (HCC)    a. 07/2017 afib->broke w/ IV amio->converted to oral bb due to prior intol to oral amio.   Pulmonary hypertension (HCC)    Stroke (HCC)    a. 06/2018 MRI/A Brain: R paramedian pons late acute/early subacute infarct, 22mm. No assoc hemorrhage or mass effect.  Sev chronic microvascular isch changtes and mod voluem loss. No large vessel occlusion, aneurysm, or significant stenosis.    Current Outpatient Medications  Medication Sig Dispense Refill   acetaminophen (TYLENOL) 500 MG tablet Take 500-1,000 mg by mouth 2 (two) times daily as needed for mild pain or moderate pain.     atorvastatin (LIPITOR) 80 MG tablet Take 1 tablet (80 mg total) by mouth daily. 90 tablet 3   methylPREDNISolone (MEDROL DOSEPAK) 4 MG TBPK tablet Use as directed 21 tablet 0   metoprolol succinate (TOPROL-XL) 25 MG 24 hr tablet Take 1 tablet (25 mg total) by mouth daily. 90 tablet 3   sacubitril-valsartan  (ENTRESTO) 24-26 MG Take 1 tablet by mouth 2 (two) times daily. 180 tablet 3   spironolactone (ALDACTONE) 25 MG tablet Take 1 tablet (25 mg total) by mouth daily. 90 tablet 3   tiZANidine (ZANAFLEX) 2 MG tablet Take 1 tablet (2 mg total) by mouth 3 (three) times daily. 90 tablet 2   No current facility-administered medications for this visit.     PHYSICAL EXAM:  General:  Well appearing. No resp difficulty HEENT: normal Neck: supple. JVP flat. No lymphadenopathy or thryomegaly appreciated. Cor: PMI normal. Regular rate & rhythm. No rubs, gallops or murmurs. Lungs: clear Abdomen: soft, nontender, nondistended. No hepatosplenomegaly. No bruits or masses.  Extremities: no cyanosis, clubbing, rash, edema Neuro: alert & oriented x3, cranial nerves grossly intact. Moves all 4 extremities w/o difficulty. Affect pleasant.   ECG: 08/20/22 showed NSR   ASSESSMENT & PLAN:  1: NICM with preserved ejection fraction- - likely due to HTN as cath showed nonobstructive CAD - NYHA class I - euvolemic today - weighing daily at home; reviewed the importance of calling for any overnight weight gain of >2 pounds or a weekly weight gain of >5 pounds - weight 237 pounds from last visit 6 months ago - Echo 08/29/20: EF is 55 %. The left ventricle has normal function. The left ventricle has no regional wall motion abnormalities. The left ventricular internal cavity size was normal in size. There is mild left ventricular hypertrophy. Left ventricular diastolic parameters are indeterminate.  - Echo 07/01/2018: EF of 40-45%.  - Echo 09/04/17: EF of 35-40%.  - Echo 06/16/17: EF of 15-20% along with mod-severe TR and an elevated PA pressure of 45 mm Hg. - Echo 11/28/22: EF 60-65% with mild LVH, Grade I DD, moderate LAE, mild Nathan - not adding salt to his food and tries to eat low sodium foods. Reminded to closely follow a 2000mg  sodium diet; using pepper for seasoning - continue metoprolol succinate 25mg  daily -  continue entresto 24/26mg  BID - continue spironolactone 25mg  daily - consider SGLT2 but he's not terribly interested in adding additional medication; will discuss further after getting echo results back - BNP on 04/04/20 was 138.8  2: HTN- - BP  - saw PCP (Tejan-Sie) 12/24 - BMP 02/12/23 showed sodium 137, potassium 4.7, creatinine 1.33 & GFR 57  3: Atrial fibrillation- - saw cardiology Mariah Milling) 06/24 - cardioverted 04/2017 - ablation done 06/2017 - declines watchman device - doesn't want blood thinners because he "almost bled out on eliquis"  4: Nonobstructive CAD- - Cardiac catheterization done 07/18/17 and showed heavily calcified coronary arteries with mild nonobstructive disease.  Left dominant system. Right heart catheterization showed moderately to severely elevated filling pressures, severe pulmonary hypertension and severely reduced cardiac output.

## 2023-04-02 ENCOUNTER — Ambulatory Visit: Payer: 59 | Admitting: Internal Medicine

## 2023-04-03 ENCOUNTER — Encounter: Payer: 59 | Admitting: Family

## 2023-04-09 ENCOUNTER — Ambulatory Visit: Payer: 59 | Admitting: Internal Medicine

## 2023-04-10 ENCOUNTER — Encounter: Payer: 59 | Admitting: Family

## 2023-04-16 ENCOUNTER — Encounter: Payer: 59 | Admitting: Family

## 2023-04-16 ENCOUNTER — Ambulatory Visit (INDEPENDENT_AMBULATORY_CARE_PROVIDER_SITE_OTHER): Payer: 59 | Admitting: Internal Medicine

## 2023-04-16 VITALS — BP 128/80 | HR 68 | Temp 98.0°F | Ht 77.0 in | Wt 238.0 lb

## 2023-04-16 DIAGNOSIS — R5381 Other malaise: Secondary | ICD-10-CM

## 2023-04-16 DIAGNOSIS — R972 Elevated prostate specific antigen [PSA]: Secondary | ICD-10-CM | POA: Diagnosis not present

## 2023-04-16 DIAGNOSIS — E782 Mixed hyperlipidemia: Secondary | ICD-10-CM | POA: Diagnosis not present

## 2023-04-16 DIAGNOSIS — Z0130 Encounter for examination of blood pressure without abnormal findings: Secondary | ICD-10-CM

## 2023-04-16 NOTE — Progress Notes (Signed)
Established Patient Office Visit  Subjective:  Patient ID: Nathan [name]., male    DOB: January 07, 1951  Age: 73 y.o. MRN: [id number]  No chief complaint on file.   No new complaints, here for prostate f/u. Yet to see urologist as appt was rescheduled. Denies any change in his urination in the interim. C/o difficulties in ADL especially showering.  No other concerns at this time.   Past Medical History:  Diagnosis Date   Atrial flutter (HCC)    a. Mariane Burpee/p TEE/DCCV 04/2017; b. 06/2017 [place]/p RFCA; c. CHADS2VASc => 5 (CHF, HTN, age x 1, CVA)-->noncompliant with Xarelto.   Chronic combined systolic and diastolic CHF (congestive heart failure) (HCC)    a. TTE 2/19: EF 20-25%, diffuse HK; b. TTE 3/19: EF 20-25%, diffuse HK; c. 08/2017 Echo: EF 35-40%, diff HK. Gr1 DD; d. 06/2018 Echo: EF 40-45%, DD. Neg bubble study.   CKD (chronic kidney disease), stage II-III    Essential hypertension    GI bleed    a.  Hemorrhagic shock in 11/19 secondary to erosive gastropathy and Barrett'Leanndra Pember esophagus requiring multiple units PRBCs; b. Cleared to resume OAC->pt did not.   NICM (nonischemic cardiomyopathy) (HCC)    a. 04/2017: Echo EF 20-25%; b. TTE 3/19: EF 20-25%; c. 07/2017 Cath: min irregs; d. 08/2017 Echo: EF 35-40%, diff HK; e. 06/2018 Echo: EF 40-45%, DD.   Noncompliance with medications    PAF (paroxysmal atrial fibrillation) (HCC)    a. 07/2017 afib->broke w/ IV amio->converted to oral bb due to prior intol to oral amio.   Pulmonary hypertension (HCC)    Stroke (HCC)    a. 06/2018 MRI/A Brain: R paramedian pons late acute/early subacute infarct, 22mm. No assoc hemorrhage or mass effect.  Sev chronic microvascular isch changtes and mod voluem loss. No large vessel occlusion, aneurysm, or significant stenosis.    Past Surgical History:  Procedure Laterality Date   A-FLUTTER ABLATION N/A 06/16/2017   Procedure: A-FLUTTER ABLATION;  Surgeon: Marinus Maw, MD;  Location: [facility] INVASIVE CV LAB;  Service:  Cardiovascular;  Laterality: N/A;   CARDIAC CATHETERIZATION     CARDIOVERSION N/A 05/01/2017   Procedure: CARDIOVERSION;  Surgeon: Antonieta Iba, MD;  Location: [facility] ORS;  Service: Cardiovascular;  Laterality: N/A;   COLONOSCOPY WITH PROPOFOL N/A 04/07/2020   Procedure: COLONOSCOPY WITH PROPOFOL;  Surgeon: Regis Bill, MD;  Location: [facility] ENDOSCOPY;  Service: Endoscopy;  Laterality: N/A;   CORONARY ANGIOPLASTY     ESOPHAGOGASTRODUODENOSCOPY N/A 04/07/2020   Procedure: ESOPHAGOGASTRODUODENOSCOPY (EGD);  Surgeon: Regis Bill, MD;  Location[facility] ENDOSCOPY;  Service: Endoscopy;  Laterality: N/A;   ESOPHAGOGASTRODUODENOSCOPY (EGD) WITH PROPOFOL N/A 02/09/2018   Procedure: ESOPHAGOGASTRODUODENOSCOPY (EGD) WITH PROPOFOL;  Surgeon: Pasty Spillers, MD;  Location: [facility] ENDOSCOPY;  Service: Endoscopy;  Laterality: N/A;   RIGHT/LEFT HEART CATH AND CORONARY ANGIOGRAPHY N/A 07/18/2017   Procedure: RIGHT/LEFT HEART CATH AND CORONARY ANGIOGRAPHY;  Surgeon: Iran Ouch, MD;  Location: [facility] INVASIVE CV LAB;  Service: Cardiovascular;  Laterality: N/A;   TEE WITHOUT CARDIOVERSION N/A 05/01/2017   Procedure: TRANSESOPHAGEAL ECHOCARDIOGRAM (TEE);  Surgeon: Antonieta Iba, MD;  Location: [facility] ORS;  Service: Cardiovascular;  Laterality: N/A;   TEE WITHOUT CARDIOVERSION N/A 06/16/2017   Procedure: TRANSESOPHAGEAL ECHOCARDIOGRAM (TEE);  Surgeon: Lars Masson, MD;  Location: [facility] ENDOSCOPY;  Service: Cardiovascular;  Laterality: N/A;    Social History   Socioeconomic History   Marital status: Single    Spouse name: Not on file   Number of  children: 1   Years of education: Not on file   Highest education level: Not on file  Occupational History   Occupation: Cook    Comment: [employer]  Tobacco Use   Smoking status: Former    Current packs/day: 0.00    Average packs/day: 1 pack/day for 3.0 years (3.0 ttl pk-yrs)    Types: Cigarettes    Start date: 04/21/2014    Quit date:  04/21/2017    Years since quitting: 5.9   Smokeless tobacco: Never  Vaping Use   Vaping status: Never Used  Substance and Sexual Activity   Alcohol use: Yes    Alcohol/week: 3.0 standard drinks of alcohol    Types: 3 Cans of beer per week    Comment: Drink a half of a 40oz beer every day, drank heavily in the past   Drug use: Never   Sexual activity: Yes    Partners: Female  Other Topics Concern   Not on file  Social History Narrative   Not on file   Social Drivers of Health   Financial Resource Strain: Low Risk  (06/15/2017)   Overall Financial Resource Strain (CARDIA)    Difficulty of Paying Living Expenses: Not hard at all  Food Insecurity: No Food Insecurity (10/05/2021)   Hunger Vital Sign    Worried About Running Out of Food in the Last Year: Never true    Ran Out of Food in the Last Year: Never true  Transportation Needs: Unmet Transportation Needs (10/05/2021)   PRAPARE - Transportation    Lack of Transportation (Medical): Yes    Lack of Transportation (Non-Medical): Yes  Physical Activity: Insufficiently Active (06/15/2017)   Exercise Vital Sign    Days of Exercise per Week: 7 days    Minutes of Exercise per Session: 20 min  Stress: No Stress Concern Present (06/15/2017)   [employer] of Occupational Health - Occupational Stress Questionnaire    Feeling of Stress : Not at all  Social Connections: Somewhat Isolated (06/15/2017)   Social Connection and Isolation Panel [NHANES]    Frequency of Communication with Friends and Family: More than three times a week    Frequency of Social Gatherings with Friends and Family: Once a week    Attends Religious Services: 1 to 4 times per year    Active Member of [employer] or Organizations: No    Attends Banker Meetings: Never    Marital Status: Divorced  Catering manager Violence: Not At Risk (06/15/2017)   Humiliation, Afraid, Rape, and Kick questionnaire    Fear of Current or Ex-Partner: No    Emotionally Abused:  No    Physically Abused: No    Sexually Abused: No    Family History  Problem Relation Age of Onset   Alzheimer'Carlisle Torgeson disease Mother    Alzheimer'Dequon Schnebly disease Father     No Known Allergies  Outpatient Medications Prior to Visit  Medication Sig   acetaminophen (TYLENOL) 500 MG tablet Take 500-1,000 mg by mouth 2 (two) times daily as needed for mild pain or moderate pain.   atorvastatin (LIPITOR) 80 MG tablet Take 1 tablet (80 mg total) by mouth daily.   metoprolol succinate (TOPROL-XL) 25 MG 24 hr tablet Take 1 tablet (25 mg total) by mouth daily.   sacubitril-valsartan (ENTRESTO) 24-26 MG Take 1 tablet by mouth 2 (two) times daily.   spironolactone (ALDACTONE) 25 MG tablet Take 1 tablet (25 mg total) by mouth daily.   tiZANidine (ZANAFLEX) 2 MG tablet Take  1 tablet (2 mg total) by mouth 3 (three) times daily.   [DISCONTINUED] methylPREDNISolone (MEDROL DOSEPAK) 4 MG TBPK tablet Use as directed   No facility-administered medications prior to visit.    Review of Systems  Constitutional: Negative.   HENT: Negative.    Eyes: Negative.   Respiratory: Negative.    Cardiovascular: Negative.   Gastrointestinal: Negative.   Genitourinary: Negative.   Skin: Negative.   Neurological: Negative.   Endo/Heme/Allergies: Negative.   Psychiatric/Behavioral: Negative.         Objective:   BP 128/80   Pulse 68   Temp 98 F (36.7 C)   Ht 6\' 5"  (1.956 m)   Wt 238 lb (108 kg)   SpO2 98%   BMI 28.22 kg/m   Vitals:   04/16/23 0919  BP: 128/80  Pulse: 68  Temp: 98 F (36.7 C)  Height: 6\' 5"  (1.956 m)  Weight: 238 lb (108 kg)  SpO2: 98%  BMI (Calculated): 28.22    Physical Exam Vitals reviewed.  Constitutional:      Appearance: Normal appearance.  HENT:     Head: Normocephalic.     Left Ear: There is no impacted cerumen.     Nose: Nose normal.     Mouth/Throat:     Mouth: Mucous membranes are moist.     Pharynx: No posterior oropharyngeal erythema.  Eyes:     Extraocular  Movements: Extraocular movements intact.     Pupils: Pupils are equal, round, and reactive to light.  Cardiovascular:     Rate and Rhythm: Regular rhythm.     Chest Wall: PMI is not displaced.     Pulses: Normal pulses.     Heart sounds: Normal heart sounds. No murmur heard. Pulmonary:     Effort: Pulmonary effort is normal.     Breath sounds: Normal air entry. Rhonchi present. No rales.  Abdominal:     General: Abdomen is flat. Bowel sounds are normal. There is no distension.     Palpations: Abdomen is soft. There is no hepatomegaly, splenomegaly or mass.     Tenderness: There is no abdominal tenderness.  Musculoskeletal:        General: Normal range of motion.     Cervical back: Normal range of motion and neck supple.     Right lower leg: No edema.     Left lower leg: No edema.  Skin:    General: Skin is warm and dry.  Neurological:     General: No focal deficit present.     Mental Status: He is alert and oriented to person, place, and time.     Cranial Nerves: Cranial nerve deficit present.     Motor: Weakness (left hemiparesis) present.     Gait: Gait abnormal (scissors gait).  Psychiatric:        Mood and Affect: Mood normal.        Behavior: Behavior normal.      No results found for any visits on 04/16/23.  Recent Results (from the past 2160 hours)  PSA     Status: Abnormal   Collection Time: 02/12/23 10:03 AM  Result Value Ref Range   Prostate Specific Ag, Serum 67.5 (H) 0.0 - 4.0 ng/mL    Comment: Roche ECLIA methodology. According to the American Urological Association, Serum PSA should decrease and remain at undetectable levels after radical prostatectomy. The AUA defines biochemical recurrence as an initial PSA value 0.2 ng/mL or greater followed by a subsequent confirmatory PSA value 0.2  ng/mL or greater. Values obtained with different assay methods or kits cannot be used interchangeably. Results cannot be interpreted as absolute evidence of the presence  or absence of malignant disease.   Lipid panel     Status: None   Collection Time: 02/12/23 10:03 AM  Result Value Ref Range   Cholesterol, Total 141 100 - 199 mg/dL   Triglycerides 82 0 - 149 mg/dL   HDL 62 >11 mg/dL   VLDL Cholesterol Cal 16 5 - 40 mg/dL   LDL Chol Calc (NIH) 63 0 - 99 mg/dL   Chol/HDL Ratio 2.3 0.0 - 5.0 ratio    Comment:                                   T. Chol/HDL Ratio                                             Men  Women                               1/2 Avg.Risk  3.4    3.3                                   Avg.Risk  5.0    4.4                                2X Avg.Risk  9.6    7.1                                3X Avg.Risk 23.4   11.0   Comprehensive metabolic panel     Status: Abnormal   Collection Time: 02/12/23 10:03 AM  Result Value Ref Range   Glucose 81 70 - 99 mg/dL   BUN 15 8 - 27 mg/dL   Creatinine, Ser 9.14 (H) 0.76 - 1.27 mg/dL   eGFR 57 (L) >78 GN/FAO/1.30   BUN/Creatinine Ratio 11 10 - 24   Sodium 137 134 - 144 mmol/L   Potassium 4.7 3.5 - 5.2 mmol/L   Chloride 102 96 - 106 mmol/L   CO2 20 20 - 29 mmol/L   Calcium 9.9 8.6 - 10.2 mg/dL   Total Protein 7.8 6.0 - 8.5 g/dL   Albumin 4.0 3.8 - 4.8 g/dL   Globulin, Total 3.8 1.5 - 4.5 g/dL   Bilirubin Total 0.5 0.0 - 1.2 mg/dL   Alkaline Phosphatase 64 44 - 121 IU/L   AST 16 0 - 40 IU/L   ALT 12 0 - 44 IU/L  CBC With Diff/Platelet     Status: Abnormal   Collection Time: 02/12/23 10:03 AM  Result Value Ref Range   WBC 5.4 3.4 - 10.8 x10E3/uL    Comment: **Effective February 17, 2023 profile [id number] WBC will be made**   non-orderable as a stand-alone order code.    RBC 4.22 4.14 - 5.80 x10E6/uL   Hemoglobin 11.9 (L) 13.0 - 17.7 g/dL   Hematocrit 69.6 (L) 29.5 - 51.0 %   MCV 84 79 - 97 fL  MCH 28.2 26.6 - 33.0 pg   MCHC 33.6 31.5 - 35.7 g/dL   RDW 16.1 09.6 - 04.5 %   Platelets 291 150 - 450 x10E3/uL   Neutrophils 58 Not Estab. %   Lymphs 27 Not Estab. %   Monocytes 11 Not Estab. %    Eos 3 Not Estab. %   Basos 1 Not Estab. %   Neutrophils Absolute 3.1 1.4 - 7.0 x10E3/uL   Lymphocytes Absolute 1.5 0.7 - 3.1 x10E3/uL   Monocytes Absolute 0.6 0.1 - 0.9 x10E3/uL   EOS (ABSOLUTE) 0.2 0.0 - 0.4 x10E3/uL   Basophils Absolute 0.0 0.0 - 0.2 x10E3/uL   Immature Granulocytes 0 Not Estab. %   Immature Grans (Abs) 0.0 0.0 - 0.1 x10E3/uL  Vitamin B12     Status: None   Collection Time: 02/12/23 10:03 AM  Result Value Ref Range   Vitamin B-12 245 232 - 1,245 pg/mL  Iron, TIBC and Ferritin Panel     Status: Abnormal   Collection Time: 02/12/23 10:03 AM  Result Value Ref Range   Total Iron Binding Capacity 244 (L) 250 - 450 ug/dL   UIBC 409 811 - 914 ug/dL   Iron 72 38 - 782 ug/dL   Iron Saturation 30 15 - 55 %   Ferritin 206 30 - 400 ng/mL  Folate     Status: None   Collection Time: 02/12/23 10:03 AM  Result Value Ref Range   Folate 10.4 >3.0 ng/mL    Comment: A serum folate concentration of less than 3.1 ng/mL is considered to represent clinical deficiency.       Assessment & Plan:  As per problem list. Recommend shower chair. Problem List Items Addressed This Visit       Other   HLD (hyperlipidemia)   Elevated PSA   Other Visit Diagnoses       Physical debility    -  Primary   Relevant Orders   Shower chair       Return in about 3 months (around 07/15/2023) for fu with labs prior.   Total time spent: 20 minutes  Luna Fuse, MD  04/16/2023   This document may have been prepared by [facility] Voice Recognition software and as such may include unintentional dictation errors.

## 2023-04-17 ENCOUNTER — Telehealth: Payer: Self-pay

## 2023-04-17 ENCOUNTER — Encounter: Payer: 59 | Admitting: Family

## 2023-04-17 NOTE — Telephone Encounter (Signed)
I pulled a prescription out of your inbox for pt for a shower chair, do I just need to call the patient to pick this up or does it need to be faxed somewhere

## 2023-04-18 NOTE — Telephone Encounter (Signed)
Faxed to [facility] 

## 2023-04-21 ENCOUNTER — Telehealth: Payer: Self-pay | Admitting: Family

## 2023-04-21 NOTE — Telephone Encounter (Signed)
Lvm for appt 04/22/23

## 2023-04-22 ENCOUNTER — Encounter: Payer: 59 | Admitting: Family

## 2023-04-23 ENCOUNTER — Ambulatory Visit: Payer: Self-pay | Admitting: Urology

## 2023-04-25 DIAGNOSIS — B351 Tinea unguium: Secondary | ICD-10-CM | POA: Diagnosis not present

## 2023-04-25 DIAGNOSIS — M79675 Pain in left toe(s): Secondary | ICD-10-CM | POA: Diagnosis not present

## 2023-04-25 DIAGNOSIS — M79674 Pain in right toe(s): Secondary | ICD-10-CM | POA: Diagnosis not present

## 2023-04-28 ENCOUNTER — Telehealth: Payer: Self-pay | Admitting: Family

## 2023-04-28 NOTE — Telephone Encounter (Signed)
 Pt confirmed appt for 04/29/23

## 2023-04-29 ENCOUNTER — Ambulatory Visit: Payer: 59 | Attending: Family | Admitting: Family

## 2023-04-29 ENCOUNTER — Encounter: Payer: Self-pay | Admitting: Family

## 2023-04-29 VITALS — BP 140/70 | HR 62 | Wt 234.2 lb

## 2023-04-29 DIAGNOSIS — I2510 Atherosclerotic heart disease of native coronary artery without angina pectoris: Secondary | ICD-10-CM | POA: Diagnosis not present

## 2023-04-29 DIAGNOSIS — Z79899 Other long term (current) drug therapy: Secondary | ICD-10-CM | POA: Diagnosis not present

## 2023-04-29 DIAGNOSIS — I130 Hypertensive heart and chronic kidney disease with heart failure and stage 1 through stage 4 chronic kidney disease, or unspecified chronic kidney disease: Secondary | ICD-10-CM | POA: Insufficient documentation

## 2023-04-29 DIAGNOSIS — I428 Other cardiomyopathies: Secondary | ICD-10-CM | POA: Insufficient documentation

## 2023-04-29 DIAGNOSIS — I10 Essential (primary) hypertension: Secondary | ICD-10-CM | POA: Diagnosis not present

## 2023-04-29 DIAGNOSIS — Z87891 Personal history of nicotine dependence: Secondary | ICD-10-CM | POA: Diagnosis not present

## 2023-04-29 DIAGNOSIS — I272 Pulmonary hypertension, unspecified: Secondary | ICD-10-CM | POA: Diagnosis not present

## 2023-04-29 DIAGNOSIS — I480 Paroxysmal atrial fibrillation: Secondary | ICD-10-CM | POA: Diagnosis not present

## 2023-04-29 DIAGNOSIS — N189 Chronic kidney disease, unspecified: Secondary | ICD-10-CM | POA: Diagnosis not present

## 2023-04-29 DIAGNOSIS — I5032 Chronic diastolic (congestive) heart failure: Secondary | ICD-10-CM

## 2023-04-29 DIAGNOSIS — I5042 Chronic combined systolic (congestive) and diastolic (congestive) heart failure: Secondary | ICD-10-CM | POA: Diagnosis not present

## 2023-04-29 DIAGNOSIS — Z8673 Personal history of transient ischemic attack (TIA), and cerebral infarction without residual deficits: Secondary | ICD-10-CM | POA: Diagnosis not present

## 2023-04-29 NOTE — Progress Notes (Signed)
Advanced Heart Failure Clinic Note   PCP: Gillermo Murdoch, MD (last seen 01/25) Primary Cardiologist: Julien Nordmann, MD (last see 06/24; returns 06/25)  Chief Complaint: heart failure visit  HPI:  Nathan [name] is a 73 y/o male with a history of HTN, stroke (06/2018), atrial flutter (04/2017), CKD, GIB, pulmonary HTN, previous tobacco use and chronic heart failure. Cardioverted 05/01/17 and had ablation 06/2017.   Has not been admitted or been in the ED in the last 6 months.   Echo 07/01/2018: EF of 40-45%. Echo 08/29/20: EF is 55 %. The left ventricle has normal function. The left ventricle has no regional wall motion abnormalities. The left ventricular internal cavity size was normal in size. There is mild left ventricular hypertrophy. Left ventricular diastolic parameters are indeterminate.   Echo 11/28/22: EF 60-65% with mild LVH, Grade I DD, moderate LAE, mild Nathan  Cardiac catheterization done 07/18/17 and showed heavily calcified coronary arteries with mild nonobstructive disease.  Left dominant system. Right heart catheterization showed moderately to severely elevated filling pressures, severe pulmonary hypertension and severely reduced cardiac output.   He presents today for a HF visit with a chief complaint of a follow-up visit. Currently denies fatigue, shortness of breath, chest pain, palpitations, abdominal distention, pedal edema, dizziness or weight gain. Reports sleeping well on 2 pillows.    ROS: All systems negative except as listed in HPI, PMH and Problem List.  SH:  Social History   Socioeconomic History   Marital status: Single    Spouse name: Not on file   Number of children: 1   Years of education: Not on file   Highest education level: Not on file  Occupational History   Occupation: Cook    Comment: [employer]  Tobacco Use   Smoking status: Former    Current packs/day: 0.00    Average packs/day: 1 pack/day for 3.0 years (3.0 ttl pk-yrs)    Types:  Cigarettes    Start date: 04/21/2014    Quit date: 04/21/2017    Years since quitting: 6.0   Smokeless tobacco: Never  Vaping Use   Vaping status: Never Used  Substance and Sexual Activity   Alcohol use: Yes    Alcohol/week: 3.0 standard drinks of alcohol    Types: 3 Cans of beer per week    Comment: Drink a half of a 40oz beer every day, drank heavily in the past   Drug use: Never   Sexual activity: Yes    Partners: Female  Other Topics Concern   Not on file  Social History Narrative   Not on file   Social Drivers of Health   Financial Resource Strain: Patient Declined (04/25/2023)   Received from [facility]   Overall Financial Resource Strain (CARDIA)    Difficulty of Paying Living Expenses: Patient declined  Food Insecurity: Patient Declined (04/25/2023)   Received from [facility]   Hunger Vital Sign    Worried About Running Out of Food in the Last Year: Patient declined    Ran Out of Food in the Last Year: Patient declined  Transportation Needs: Patient Declined (04/25/2023)   Received from [facility] - Transportation    In the past 12 months, has lack of transportation kept you from medical appointments or from getting medications?: Patient declined    Lack of Transportation (Non-Medical): Patient declined  Physical Activity: Insufficiently Active (06/15/2017)   Exercise Vital Sign    Days of  Exercise per Week: 7 days    Minutes of Exercise per Session: 20 min  Stress: No Stress Concern Present (06/15/2017)   [employer] of Occupational Health - Occupational Stress Questionnaire    Feeling of Stress : Not at all  Social Connections: Somewhat Isolated (06/15/2017)   Social Connection and Isolation Panel [NHANES]    Frequency of Communication with Friends and Family: More than three times a week    Frequency of Social Gatherings with Friends and Family: Once a week    Attends Religious Services: 1 to 4 times  per year    Active Member of [employer] or Organizations: No    Attends Banker Meetings: Never    Marital Status: Divorced  Catering manager Violence: Not At Risk (06/15/2017)   Humiliation, Afraid, Rape, and Kick questionnaire    Fear of Current or Ex-Partner: No    Emotionally Abused: No    Physically Abused: No    Sexually Abused: No    FH:  Family History  Problem Relation Age of Onset   Alzheimer's disease Mother    Alzheimer's disease Father     Past Medical History:  Diagnosis Date   Atrial flutter (HCC)    a. s/p TEE/DCCV 04/2017; b. 06/2017 s/p RFCA; c. CHADS2VASc => 5 (CHF, HTN, age x 1, CVA)-->noncompliant with Xarelto.   Chronic combined systolic and diastolic CHF (congestive heart failure) (HCC)    a. TTE 2/19: EF 20-25%, diffuse HK; b. TTE 3/19: EF 20-25%, diffuse HK; c. 08/2017 Echo: EF 35-40%, diff HK. Gr1 DD; d. 06/2018 Echo: EF 40-45%, DD. Neg bubble study.   CKD (chronic kidney disease), stage II-III    Essential hypertension    GI bleed    a.  Hemorrhagic shock in 11/19 secondary to erosive gastropathy and Barrett's esophagus requiring multiple units PRBCs; b. Cleared to resume OAC->pt did not.   NICM (nonischemic cardiomyopathy) (HCC)    a. 04/2017: Echo EF 20-25%; b. TTE 3/19: EF 20-25%; c. 07/2017 Cath: min irregs; d. 08/2017 Echo: EF 35-40%, diff HK; e. 06/2018 Echo: EF 40-45%, DD.   Noncompliance with medications    PAF (paroxysmal atrial fibrillation) (HCC)    a. 07/2017 afib->broke w/ IV amio->converted to oral bb due to prior intol to oral amio.   Pulmonary hypertension (HCC)    Stroke (HCC)    a. 06/2018 MRI/A Brain: R paramedian pons late acute/early subacute infarct, 22mm. No assoc hemorrhage or mass effect.  Sev chronic microvascular isch changtes and mod voluem loss. No large vessel occlusion, aneurysm, or significant stenosis.    Current Outpatient Medications  Medication Sig Dispense Refill   acetaminophen (TYLENOL) 500 MG tablet Take  500-1,000 mg by mouth 2 (two) times daily as needed for mild pain or moderate pain.     atorvastatin (LIPITOR) 80 MG tablet Take 1 tablet (80 mg total) by mouth daily. 90 tablet 3   metoprolol succinate (TOPROL-XL) 25 MG 24 hr tablet Take 1 tablet (25 mg total) by mouth daily. 90 tablet 3   sacubitril-valsartan (ENTRESTO) 24-26 MG Take 1 tablet by mouth 2 (two) times daily. 180 tablet 3   spironolactone (ALDACTONE) 25 MG tablet Take 1 tablet (25 mg total) by mouth daily. 90 tablet 3   tiZANidine (ZANAFLEX) 2 MG tablet Take 1 tablet (2 mg total) by mouth 3 (three) times daily. 90 tablet 2   No current facility-administered medications for this visit.   Vitals:   04/29/23 1003  BP: (!) 140/70  Pulse:  62  SpO2: 100%  Weight: 234 lb 4 oz (106.3 kg)   Wt Readings from Last 3 Encounters:  04/29/23 234 lb 4 oz (106.3 kg)  04/16/23 238 lb (108 kg)  02/25/23 238 lb (108 kg)   Lab Results  Component Value Date   CREATININE 1.33 (H) 02/12/2023   CREATININE CANCELED 10/09/2022   CREATININE 1.62 (H) 07/10/2022     PHYSICAL EXAM:  General: Well appearing. No resp difficulty HEENT: normal Neck: supple, no JVD Cor: Regular rhythm, rate. No rubs, gallops or murmurs Lungs: clear Abdomen: soft, nontender, nondistended. Extremities: no cyanosis, clubbing, rash, trace pitting edema left lower leg Neuro: alert & oriented X 3. Moves all 4 extremities w/o difficulty. Affect pleasant  ECG: not done   ASSESSMENT & PLAN:  1: NICM with preserved ejection fraction- - likely due to HTN as cath showed nonobstructive CAD - NYHA class I - euvolemic today - weight down 3 pounds from last visit 7 months ago - Echo 07/01/2018: EF of 40-45%. - Echo 08/29/20: EF is 55 %. The left ventricle has no regional wall motion abnormalities. The left ventricular internal cavity size was normal in size. There is mild left ventricular hypertrophy.  - Echo 11/28/22: EF 60-65% with mild LVH, Grade I DD, moderate LAE,  mild Nathan - not adding salt to his food and tries to eat low sodium foods. Reminded to closely follow a 2000mg  sodium diet; using pepper for seasoning - continue metoprolol succinate 25mg  daily - continue entresto 24/26mg  BID - continue spironolactone 25mg  daily - consider SGLT2 but he's not terribly interested in adding additional medication - BNP on 04/04/20 was 138.8  2: HTN- - BP 140/70 - saw PCP (Tejan-Sie) 01/25 - BMP 02/12/23 reviewed and showed sodium 137, potassium 4.7, creatinine 1.33 & GFR 57; he defers lab work to PCP  3: Atrial fibrillation- - saw cardiology Mariah Milling) 06/24 - cardioverted 04/2017 - ablation done 06/2017 - declines watchman device - doesn't want blood thinners because he "almost bled out on eliquis"  4: Nonobstructive CAD- - Cardiac catheterization done 07/18/17 and showed heavily calcified coronary arteries with mild nonobstructive disease.  Left dominant system. Right heart catheterization showed moderately to severely elevated filling pressures, severe pulmonary hypertension and severely reduced cardiac output.  - continue atorvastatin 80mg  daily - LDL 02/12/23 reviewed and was 63   Due to HF stability, will not make another appointment at this time. Advised him to follow closely with cardiology and PCP and that he could call back at anytime for questions or to make another appointment. He was comfortable with this plan.    Nathan Freeze, FNP 04/29/23

## 2023-04-29 NOTE — Patient Instructions (Signed)
Call us in the future if you need [place] for anything

## 2023-05-08 ENCOUNTER — Ambulatory Visit: Payer: Self-pay | Admitting: Urology

## 2023-05-12 DIAGNOSIS — R5381 Other malaise: Secondary | ICD-10-CM | POA: Diagnosis not present

## 2023-05-15 ENCOUNTER — Ambulatory Visit: Payer: Self-pay | Admitting: Urology

## 2023-06-05 ENCOUNTER — Ambulatory Visit (INDEPENDENT_AMBULATORY_CARE_PROVIDER_SITE_OTHER): Payer: 59 | Admitting: Urology

## 2023-06-05 VITALS — BP 114/59 | HR 65 | Ht 77.0 in | Wt 234.0 lb

## 2023-06-05 DIAGNOSIS — R972 Elevated prostate specific antigen [PSA]: Secondary | ICD-10-CM

## 2023-06-05 NOTE — Progress Notes (Signed)
 Marcelle Overlie Plume,acting as a scribe for Vanna Scotland, MD.,have documented all relevant documentation on the behalf of Vanna Scotland, MD,as directed by  Vanna Scotland, MD while in the presence of Vanna Scotland, MD.  06/05/23 10:40 AM   Nathan [name]. 12-Feb-1951 [id number]  Referring provider: Sherron Monday, MD [address] [place],  [place] [address]  Chief Complaint  Patient presents with   Establish Care   Elevated PSA    HPI:  73 y/o male who presents today for further evaluation of elevated PSA. He has a personal history of cardiac arrhythmia, stroke, and non-ischemic cardiomyopathy. He was referred back in 2021 but did not attend the appointment, and the referral was closed.  His PSA was markedly elevated to 67.5 in November 2024, with a previous PSA of 38.5 in 2021. He has not had his PSA checked since then.   He denies any urinary symptoms, blood in urine, or new aches or pains. He is unaware of any family history of prostate cancer. He is concerned about the possibility of prostate cancer due to the elevated PSA levels. He is resistant to undergoing a rectal exam or biopsy but is open to alternative diagnostic methods such as imaging.   He has a history of smoking but quit after experiencing a stroke.  He believes that smoking cessation caused his stroke.  He denies any history of broken bones or having a pacemaker.   PMH: Past Medical History:  Diagnosis Date   Atrial flutter (HCC)    a. s/p TEE/DCCV 04/2017; b. 06/2017 s/p RFCA; c. CHADS2VASc => 5 (CHF, HTN, age x 1, CVA)-->noncompliant with Xarelto.   Chronic combined systolic and diastolic CHF (congestive heart failure) (HCC)    a. TTE 2/19: EF 20-25%, diffuse HK; b. TTE 3/19: EF 20-25%, diffuse HK; c. 08/2017 Echo: EF 35-40%, diff HK. Gr1 DD; d. 06/2018 Echo: EF 40-45%, DD. Neg bubble study.   CKD (chronic kidney disease), stage II-III    Essential hypertension    GI bleed    a.  Hemorrhagic shock in 11/19  secondary to erosive gastropathy and Barrett's esophagus requiring multiple units PRBCs; b. Cleared to resume OAC->pt did not.   NICM (nonischemic cardiomyopathy) (HCC)    a. 04/2017: Echo EF 20-25%; b. TTE 3/19: EF 20-25%; c. 07/2017 Cath: min irregs; d. 08/2017 Echo: EF 35-40%, diff HK; e. 06/2018 Echo: EF 40-45%, DD.   Noncompliance with medications    PAF (paroxysmal atrial fibrillation) (HCC)    a. 07/2017 afib->broke w/ IV amio->converted to oral bb due to prior intol to oral amio.   Pulmonary hypertension (HCC)    Stroke (HCC)    a. 06/2018 MRI/A Brain: R paramedian pons late acute/early subacute infarct, 22mm. No assoc hemorrhage or mass effect.  Sev chronic microvascular isch changtes and mod voluem loss. No large vessel occlusion, aneurysm, or significant stenosis.    Surgical History: Past Surgical History:  Procedure Laterality Date   A-FLUTTER ABLATION N/A 06/16/2017   Procedure: A-FLUTTER ABLATION;  Surgeon: Marinus Maw, MD;  Location: [facility] INVASIVE CV LAB;  Service: Cardiovascular;  Laterality: N/A;   CARDIAC CATHETERIZATION     CARDIOVERSION N/A 05/01/2017   Procedure: CARDIOVERSION;  Surgeon: Antonieta Iba, MD;  Location: [facility] ORS;  Service: Cardiovascular;  Laterality: N/A;   COLONOSCOPY WITH PROPOFOL N/A 04/07/2020   Procedure: COLONOSCOPY WITH PROPOFOL;  Surgeon: Regis Bill, MD;  Location: [facility] ENDOSCOPY;  Service: Endoscopy;  Laterality: N/A;   CORONARY ANGIOPLASTY  ESOPHAGOGASTRODUODENOSCOPY N/A 04/07/2020   Procedure: ESOPHAGOGASTRODUODENOSCOPY (EGD);  Surgeon: Regis Bill, MD;  Location[facility] ENDOSCOPY;  Service: Endoscopy;  Laterality: N/A;   ESOPHAGOGASTRODUODENOSCOPY (EGD) WITH PROPOFOL N/A 02/09/2018   Procedure: ESOPHAGOGASTRODUODENOSCOPY (EGD) WITH PROPOFOL;  Surgeon: Pasty Spillers, MD;  Location: [facility] ENDOSCOPY;  Service: Endoscopy;  Laterality: N/A;   RIGHT/LEFT HEART CATH AND CORONARY ANGIOGRAPHY N/A 07/18/2017   Procedure: RIGHT/LEFT  HEART CATH AND CORONARY ANGIOGRAPHY;  Surgeon: Iran Ouch, MD;  Location: [facility] INVASIVE CV LAB;  Service: Cardiovascular;  Laterality: N/A;   TEE WITHOUT CARDIOVERSION N/A 05/01/2017   Procedure: TRANSESOPHAGEAL ECHOCARDIOGRAM (TEE);  Surgeon: Antonieta Iba, MD;  Location: [facility] ORS;  Service: Cardiovascular;  Laterality: N/A;   TEE WITHOUT CARDIOVERSION N/A 06/16/2017   Procedure: TRANSESOPHAGEAL ECHOCARDIOGRAM (TEE);  Surgeon: Lars Masson, MD;  Location: [facility] ENDOSCOPY;  Service: Cardiovascular;  Laterality: N/A;    Home Medications:  Allergies as of 06/05/2023   No Known Allergies      Medication List        Accurate as of June 05, 2023 10:40 AM. If you have any questions, ask your nurse or doctor.          acetaminophen 500 MG tablet Commonly known as: TYLENOL Take 500-1,000 mg by mouth 2 (two) times daily as needed for mild pain or moderate pain.   atorvastatin 80 MG tablet Commonly known as: LIPITOR Take 1 tablet (80 mg total) by mouth daily.   Entresto 24-26 MG Generic drug: sacubitril-valsartan Take 1 tablet by mouth 2 (two) times daily.   metoprolol succinate 25 MG 24 hr tablet Commonly known as: TOPROL-XL Take 1 tablet (25 mg total) by mouth daily.   spironolactone 25 MG tablet Commonly known as: ALDACTONE Take 1 tablet (25 mg total) by mouth daily.   tiZANidine 2 MG tablet Commonly known as: ZANAFLEX Take 1 tablet (2 mg total) by mouth 3 (three) times daily.        Family History: Family History  Problem Relation Age of Onset   Alzheimer's disease Mother    Alzheimer's disease Father     Social History:  reports that he quit smoking about 6 years ago. His smoking use included cigarettes. He started smoking about 9 years ago. He has a 3 pack-year smoking history. He has never used smokeless tobacco. He reports current alcohol use of about 3.0 standard drinks of alcohol per week. He reports that he does not use drugs.   Physical  Exam: BP (!) 114/59   Pulse 65   Ht 6\' 5"  (1.956 m)   Wt 234 lb (106.1 kg)   BMI 27.75 kg/m   Constitutional:  Alert and oriented, No acute distress. HEENT:  AT, moist mucus membranes.  Trachea midline, no masses. Neurologic: Grossly intact, no focal deficits, moving all 4 extremities. Psychiatric: Normal mood and affect.   Assessment & Plan:    1. Elevated PSA with concern for prostate cancer - His PSA level is markedly elevated, raising concern for prostate cancer.  - He has refused a digital rectal exam and biopsy, which are standard procedures for diagnosing prostate cancer. - Attempt to schedule a PSMA PET scan to evaluate for prostate cancer. If insurance denies this, attempt to schedule a prostate MRI or pelvic MRI. He will be contacted to schedule these imaging studies.  - Follow-up in one month to review imaging results and discuss further management options, including potential therapies if cancer is confirmed.  2. History of stroke and cardiac  arrhythmia - He has a history of stroke, which he associates with quitting smoking, and cardiac arrhythmia. He is currently not smoking, which is beneficial for his cardiovascular health. - Continue to monitor cardiovascular health and encourage him to maintain a smoke-free lifestyle.  Return in about 1 month (around 07/06/2023) for review of PSMA PET scan and discuss further management options.  I have reviewed the above documentation for accuracy and completeness, and I agree with the above.   Vanna Scotland, MD    [facility] [address], [address] [place], [place] [address] [phone]

## 2023-06-13 ENCOUNTER — Telehealth: Payer: Self-pay

## 2023-06-13 NOTE — Telephone Encounter (Signed)
 Cousin, Charlene L  Jung Yurchak, [place] V, CMA; Vanna Scotland, MD Good Afternoon Dr.Brandon,  Lupita Leash in scheduling for NM reached out to me and stated patient refused scan due to not being able to have a morning visit. Lupita Leash stated for these scans they are done at certain times of the day. Just wanted to update you and didn't know if you wanted to reach out to the patient before she closes the order.  Thanks, Charlene    06/13/23 I called and left message for the patient asking him if there is a reason he is not able to come to an appointment that is not in the morning like transportation, etc. Advised that it is important to get this scan done and to please call us back to follow up on this.

## 2023-06-24 NOTE — Telephone Encounter (Signed)
 Left message for the patient to call back, also called his fiance -ok per DPR on file, and spoke with her about the question in regards to the scheduling and that we need to hear back from patient as this test is important

## 2023-06-25 NOTE — Telephone Encounter (Signed)
 Patient returned call. He stated that he just did not like later appointments. I relayed again that these scans are only done at certain times of the day (per Charlene's message). I also relayed that it was important to get this scan done. I gave him, and his fiance the phone number to call scheduling, and he said he will call.

## 2023-06-25 NOTE — Telephone Encounter (Signed)
 FYI

## 2023-06-26 ENCOUNTER — Other Ambulatory Visit: Payer: Self-pay | Admitting: Internal Medicine

## 2023-06-26 DIAGNOSIS — G8929 Other chronic pain: Secondary | ICD-10-CM

## 2023-07-09 ENCOUNTER — Ambulatory Visit: Admission: RE | Admit: 2023-07-09 | Source: Ambulatory Visit

## 2023-07-09 ENCOUNTER — Other Ambulatory Visit: Payer: Self-pay | Admitting: Internal Medicine

## 2023-07-09 ENCOUNTER — Other Ambulatory Visit

## 2023-07-09 DIAGNOSIS — E782 Mixed hyperlipidemia: Secondary | ICD-10-CM | POA: Diagnosis not present

## 2023-07-10 LAB — COMPREHENSIVE METABOLIC PANEL WITH GFR
ALT: 12 IU/L (ref 0–44)
AST: 15 IU/L (ref 0–40)
Albumin: 4.2 g/dL (ref 3.8–4.8)
Alkaline Phosphatase: 73 IU/L (ref 44–121)
BUN/Creatinine Ratio: 13 (ref 10–24)
BUN: 21 mg/dL (ref 8–27)
Bilirubin Total: 0.5 mg/dL (ref 0.0–1.2)
CO2: 19 mmol/L — ABNORMAL LOW (ref 20–29)
Calcium: 9.6 mg/dL (ref 8.6–10.2)
Chloride: 104 mmol/L (ref 96–106)
Creatinine, Ser: 1.66 mg/dL — ABNORMAL HIGH (ref 0.76–1.27)
Globulin, Total: 3.3 g/dL (ref 1.5–4.5)
Glucose: 86 mg/dL (ref 70–99)
Potassium: 5.3 mmol/L — ABNORMAL HIGH (ref 3.5–5.2)
Sodium: 137 mmol/L (ref 134–144)
Total Protein: 7.5 g/dL (ref 6.0–8.5)
eGFR: 44 mL/min/{1.73_m2} — ABNORMAL LOW (ref >59)

## 2023-07-10 LAB — LIPID PANEL
Chol/HDL Ratio: 1.8 ratio (ref 0.0–5.0)
Cholesterol, Total: 136 mg/dL (ref 100–199)
HDL: 76 mg/dL (ref >39)
LDL Chol Calc (NIH): 49 mg/dL (ref 0–99)
Triglycerides: 51 mg/dL (ref 0–149)
VLDL Cholesterol Cal: 11 mg/dL (ref 5–40)

## 2023-07-23 ENCOUNTER — Ambulatory Visit (INDEPENDENT_AMBULATORY_CARE_PROVIDER_SITE_OTHER): Payer: 59 | Admitting: Internal Medicine

## 2023-07-23 ENCOUNTER — Encounter: Payer: Self-pay | Admitting: Internal Medicine

## 2023-07-23 VITALS — BP 115/70 | HR 83 | Temp 97.8°F | Ht 77.0 in | Wt 238.0 lb

## 2023-07-23 DIAGNOSIS — E782 Mixed hyperlipidemia: Secondary | ICD-10-CM | POA: Diagnosis not present

## 2023-07-23 DIAGNOSIS — N1831 Chronic kidney disease, stage 3a: Secondary | ICD-10-CM

## 2023-07-23 DIAGNOSIS — I10 Essential (primary) hypertension: Secondary | ICD-10-CM | POA: Diagnosis not present

## 2023-07-23 DIAGNOSIS — E875 Hyperkalemia: Secondary | ICD-10-CM | POA: Diagnosis not present

## 2023-07-23 MED ORDER — LOKELMA 10 G PO PACK: 10.0000 g | PACK | Freq: Three times a day (TID) | ORAL | 0 refills | Status: AC

## 2023-07-23 NOTE — Progress Notes (Signed)
 Established Patient Office Visit  Subjective:  Patient ID: Nathan [name]., male    DOB: 1951/01/17  Age: 73 y.o. MRN: [id number]  Chief Complaint  Patient presents with   Follow-up    3 month follow up, discuss lab results.    No new complaints, here for lab review and medication refills. Labs reviewed and notable for well controlled lipids stable ckd but  hyperkalemia.     No other concerns at this time.   Past Medical History:  Diagnosis Date   Atrial flutter (HCC)    a. [place]/p TEE/DCCV 04/2017; b. 06/2017 Aseel [place]/p RFCA; c. CHADS2VASc => 5 (CHF, HTN, age x 1, CVA)-->noncompliant with Xarelto .   Chronic combined systolic and diastolic CHF (congestive heart failure) (HCC)    a. TTE 2/19: EF 20-25%, diffuse HK; b. TTE 3/19: EF 20-25%, diffuse HK; c. 08/2017 Echo: EF 35-40%, diff HK. Gr1 DD; d. 06/2018 Echo: EF 40-45%, DD. Neg bubble study.   CKD (chronic kidney disease), stage II-III    Essential hypertension    GI bleed    a.  Hemorrhagic shock in 11/19 secondary to erosive gastropathy and Barrett'Ronica Vivian esophagus requiring multiple units PRBCs; b. Cleared to resume OAC->pt did not.   NICM (nonischemic cardiomyopathy) (HCC)    a. 04/2017: Echo EF 20-25%; b. TTE 3/19: EF 20-25%; c. 07/2017 Cath: min irregs; d. 08/2017 Echo: EF 35-40%, diff HK; e. 06/2018 Echo: EF 40-45%, DD.   Noncompliance with medications    PAF (paroxysmal atrial fibrillation) (HCC)    a. 07/2017 afib->broke w/ IV amio->converted to oral bb due to prior intol to oral amio.   Pulmonary hypertension (HCC)    Stroke (HCC)    a. 06/2018 MRI/A Brain: R paramedian pons late acute/early subacute infarct, 22mm. No assoc hemorrhage or mass effect.  Sev chronic microvascular isch changtes and mod voluem loss. No large vessel occlusion, aneurysm, or significant stenosis.    Past Surgical History:  Procedure Laterality Date   A-FLUTTER ABLATION N/A 06/16/2017   Procedure: A-FLUTTER ABLATION;  Surgeon: Tammie Fall, MD;  Location: [facility]  INVASIVE CV LAB;  Service: Cardiovascular;  Laterality: N/A;   CARDIAC CATHETERIZATION     CARDIOVERSION N/A 05/01/2017   Procedure: CARDIOVERSION;  Surgeon: Devorah Fonder, MD;  Location: [facility] ORS;  Service: Cardiovascular;  Laterality: N/A;   COLONOSCOPY WITH PROPOFOL  N/A 04/07/2020   Procedure: COLONOSCOPY WITH PROPOFOL ;  Surgeon: Shane Darling, MD;  Location: [facility] ENDOSCOPY;  Service: Endoscopy;  Laterality: N/A;   CORONARY ANGIOPLASTY     ESOPHAGOGASTRODUODENOSCOPY N/A 04/07/2020   Procedure: ESOPHAGOGASTRODUODENOSCOPY (EGD);  Surgeon: Shane Darling, MD;  Location: [facility] ENDOSCOPY;  Service: Endoscopy;  Laterality: N/A;   ESOPHAGOGASTRODUODENOSCOPY (EGD) WITH PROPOFOL  N/A 02/09/2018   Procedure: ESOPHAGOGASTRODUODENOSCOPY (EGD) WITH PROPOFOL ;  Surgeon: Irby Mannan, MD;  Location: [facility] ENDOSCOPY;  Service: Endoscopy;  Laterality: N/A;   RIGHT/LEFT HEART CATH AND CORONARY ANGIOGRAPHY N/A 07/18/2017   Procedure: RIGHT/LEFT HEART CATH AND CORONARY ANGIOGRAPHY;  Surgeon: Wenona Hamilton, MD;  Location: [facility] INVASIVE CV LAB;  Service: Cardiovascular;  Laterality: N/A;   TEE WITHOUT CARDIOVERSION N/A 05/01/2017   Procedure: TRANSESOPHAGEAL ECHOCARDIOGRAM (TEE);  Surgeon: Devorah Fonder, MD;  Location: [facility] ORS;  Service: Cardiovascular;  Laterality: N/A;   TEE WITHOUT CARDIOVERSION N/A 06/16/2017   Procedure: TRANSESOPHAGEAL ECHOCARDIOGRAM (TEE);  Surgeon: Liza Riggers, MD;  Location[facility] ENDOSCOPY;  Service: Cardiovascular;  Laterality: N/A;    Social History   Socioeconomic History   Marital status: Single  Spouse name: Not on file   Number of children: 1   Years of education: Not on file   Highest education level: Not on file  Occupational History   Occupation: Cook    Comment: [employer]  Tobacco Use   Smoking status: Former    Current packs/day: 0.00    Average packs/day: 1 pack/day for 3.0 years (3.0 ttl pk-yrs)    Types: Cigarettes    Start date:  04/21/2014    Quit date: 04/21/2017    Years since quitting: 6.2   Smokeless tobacco: Never  Vaping Use   Vaping status: Never Used  Substance and Sexual Activity   Alcohol use: Yes    Alcohol/week: 3.0 standard drinks of alcohol    Types: 3 Cans of beer per week    Comment: Drink a half of a 40oz beer every day, drank heavily in the past   Drug use: Never   Sexual activity: Yes    Partners: Female  Other Topics Concern   Not on file  Social History Narrative   Not on file   Social Drivers of Health   Financial Resource Strain: Patient Declined (04/25/2023)   Received from [facility]   Overall Financial Resource Strain (CARDIA)    Difficulty of Paying Living Expenses: Patient declined  Food Insecurity: Patient Declined (04/25/2023)   Received from [facility]   Hunger Vital Sign    Worried About Running Out of Food in the Last Year: Patient declined    Ran Out of Food in the Last Year: Patient declined  Transportation Needs: Patient Declined (04/25/2023)   Received from [facility] - Transportation    In the past 12 months, has lack of transportation kept you from medical appointments or from getting medications?: Patient declined    Lack of Transportation (Non-Medical): Patient declined  Physical Activity: Insufficiently Active (06/15/2017)   Exercise Vital Sign    Days of Exercise per Week: 7 days    Minutes of Exercise per Session: 20 min  Stress: No Stress Concern Present (06/15/2017)   [employer] of Occupational Health - Occupational Stress Questionnaire    Feeling of Stress : Not at all  Social Connections: Somewhat Isolated (06/15/2017)   Social Connection and Isolation Panel [NHANES]    Frequency of Communication with Friends and Family: More than three times a week    Frequency of Social Gatherings with Friends and Family: Once a week    Attends Religious Services: 1 to 4 times per year    Active Member  of [employer] or Organizations: No    Attends Banker Meetings: Never    Marital Status: Divorced  Catering manager Violence: Not At Risk (06/15/2017)   Humiliation, Afraid, Rape, and Kick questionnaire    Fear of Current or Ex-Partner: No    Emotionally Abused: No    Physically Abused: No    Sexually Abused: No    Family History  Problem Relation Age of Onset   Alzheimer'Jaryn Rosko disease Mother    Alzheimer'Chantell Kunkler disease Father     No Known Allergies  Outpatient Medications Prior to Visit  Medication Sig   acetaminophen  (TYLENOL ) 500 MG tablet Take 500-1,000 mg by mouth 2 (two) times daily as needed for mild pain or moderate pain.   atorvastatin  (LIPITOR ) 80 MG tablet Take 1 tablet (80 mg total) by mouth daily.   metoprolol  succinate (TOPROL -XL) 25 MG 24 hr tablet Take 1 tablet (25  mg total) by mouth daily.   sacubitril -valsartan  (ENTRESTO ) 24-26 MG Take 1 tablet by mouth 2 (two) times daily.   spironolactone  (ALDACTONE ) 25 MG tablet Take 1 tablet (25 mg total) by mouth daily.   tiZANidine  (ZANAFLEX ) 2 MG tablet TAKE 1 TABLET BY MOUTH THREE TIMES DAILY   No facility-administered medications prior to visit.    ROS     Objective:   BP 115/70   Pulse 83   Temp 97.8 F (36.6 C)   Ht 6\' 5"  (1.956 m)   Wt 238 lb (108 kg)   SpO2 99%   BMI 28.22 kg/m   Vitals:   07/23/23 0925  BP: 115/70  Pulse: 83  Temp: 97.8 F (36.6 C)  Height: 6\' 5"  (1.956 m)  Weight: 238 lb (108 kg)  SpO2: 99%  BMI (Calculated): 28.22    Physical Exam   No results found for any visits on 07/23/23.  Recent Results (from the past 2160 hours)  Comprehensive metabolic panel with GFR     Status: Abnormal   Collection Time: 07/09/23  9:44 AM  Result Value Ref Range   Glucose 86 70 - 99 mg/dL   BUN 21 8 - 27 mg/dL   Creatinine, Ser 1.61 (H) 0.76 - 1.27 mg/dL   eGFR 44 (L) >09 UE/AVW/0.98   BUN/Creatinine Ratio 13 10 - 24   Sodium 137 134 - 144 mmol/L   Potassium 5.3 (H) 3.5 - 5.2 mmol/L    Chloride 104 96 - 106 mmol/L   CO2 19 (L) 20 - 29 mmol/L   Calcium  9.6 8.6 - 10.2 mg/dL   Total Protein 7.5 6.0 - 8.5 g/dL   Albumin 4.2 3.8 - 4.8 g/dL   Globulin, Total 3.3 1.5 - 4.5 g/dL   Bilirubin Total 0.5 0.0 - 1.2 mg/dL   Alkaline Phosphatase 73 44 - 121 IU/L   AST 15 0 - 40 IU/L   ALT 12 0 - 44 IU/L  Lipid panel     Status: None   Collection Time: 07/09/23  9:44 AM  Result Value Ref Range   Cholesterol, Total 136 100 - 199 mg/dL   Triglycerides 51 0 - 149 mg/dL   HDL 76 >11 mg/dL   VLDL Cholesterol Cal 11 5 - 40 mg/dL   LDL Chol Calc (NIH) 49 0 - 99 mg/dL   Chol/HDL Ratio 1.8 0.0 - 5.0 ratio    Comment:                                   T. Chol/HDL Ratio                                             Men  Women                               1/2 Avg.Risk  3.4    3.3                                   Avg.Risk  5.0    4.4  2X Avg.Risk  9.6    7.1                                3X Avg.Risk 23.4   11.0       Assessment & Plan:  As per problem list. Follow up with Cardiology to adjust CHF meds that may be causing his hyperkalemia. Repeat bmp after completion of Lokelma. Problem List Items Addressed This Visit       Cardiovascular and Mediastinum   HTN (hypertension) (Chronic)   Relevant Orders   BMP8+Anion Gap     Genitourinary   CKD (chronic kidney disease), stage IIIa   Relevant Orders   CBC With Diff/Platelet   Comprehensive metabolic panel with GFR     Other   Hyperkalemia - Primary   Relevant Medications   sodium zirconium cyclosilicate (LOKELMA) 10 g PACK packet   HLD (hyperlipidemia)   Relevant Orders   Lipid panel   CK    Return in about 14 weeks (around 10/29/2023) for awv with labs prior.   Total time spent: 20 minutes  Arzella Bitters, MD  07/23/2023   This document may have been prepared by [facility] Voice Recognition software and as such may include unintentional dictation errors.

## 2023-07-29 NOTE — Telephone Encounter (Signed)
 Patient missed his scan appointment on 07/09/23. I called patient today and left a detailed message asking patient to call us  back if he is wanting to reschedule the scan or keep his appointment with Dr Ace Holder to discuss further.

## 2023-07-30 ENCOUNTER — Ambulatory Visit: Admitting: Urology

## 2023-08-04 ENCOUNTER — Other Ambulatory Visit: Payer: Self-pay | Admitting: Internal Medicine

## 2023-08-04 ENCOUNTER — Other Ambulatory Visit: Payer: Self-pay | Admitting: Cardiovascular Disease

## 2023-08-04 DIAGNOSIS — M545 Low back pain, unspecified: Secondary | ICD-10-CM

## 2023-09-05 ENCOUNTER — Other Ambulatory Visit: Payer: Self-pay | Admitting: Internal Medicine

## 2023-09-05 DIAGNOSIS — G8929 Other chronic pain: Secondary | ICD-10-CM

## 2023-09-12 NOTE — Progress Notes (Deleted)
 ., Cardiology Office Note  Date:  09/12/2023   ID:  Dianne Bady., DOB March 25, 1950, MRN [id number]  PCP:  Albina GORMAN Dine, MD   No chief complaint on file.   HPI:  73 y.o. male with past medical history of CAD tobacco abuse quit 04/28/17  Admission to the hospital 04/2017 with new onset atrial flutter with RVR,  elevated troponin,  acute combined systolic and diastolic CHF Ejection fraction 20-25% February 13th 2019 S/p TEE and cardioversion 05/01/2017 Back into atrial flutter, ejection fraction 20-25% Flutter ablation 06/16/2017 Was in atrial fibrillation on office visit with Dr. Fernande July 05 2017 Interval atrial fibrillation and developed a stroke 4/20 ,currently anticoagulated with Eliquis . Echo 06/2018: ef 40 to 45% Echo June 2022 EF 55% Who presents for follow up of his cardiomyopathy, atrial flutter , history of cardioversion  Last seen in clinic by myself 6/24   Followed by CHF clinic, last seen January 2024  Nonsmoker In follow-up today reports he feels well with no complaints Uses a walker to ambulate No regular exercise program,  1-2 beers a day Has an aide who drives, He does not drive  Not on Lasix  Denies significant lower extremity edema, no SOB  No arrhythmia  Labs reviewed Total chol 143, LDL 57,  CR 1.62  Previous discussions concerning his tachyarrhythmia, history of atrial fibrillation/flutter Previously did not want blood thinners,  I almost bled out on eliquis  Does not want watchman device  EKG personally reviewed by myself on todays visit Nsr rate 69 no significant ST-T wave changes  Other past medical history reviewed Echo 6/22  1. Left ventricular ejection fraction, by estimation, is 55 %. The left  ventricle has normal function. Unable to exclude hypokinesis of the basal  to mid inferior wall. There is mild left ventricular hypertrophy.   2. Right ventricular systolic function is normal. The right ventricular  size is normal.    3. The mitral valve is normal in structure. No evidence of mitral valve  regurgitation. No evidence of mitral stenosis.   hospital January 2022 symptomatic anemia, CHF Hemoglobin 7.4  s/p Transfuse 1 unit of blood which is ordered by ED physician. anemia panel, iron  saturation 8%,  CVA 06/2018: left side, left hand back 80%, not as strong   cardiac catheterization 07/18/2017 showing nonobstructive disease Heavy calcification markedly elevated left ventricular end-diastolic pressures Insisted on leaving the hospital 07/19/2017  Discharged in normal sinus rhythm Lasix  up to 40 twice a day with potassium 20 twice a day   atrial flutter with RVR with 2:1 AV block with heart rates in the 120s bpm Despite advnacing meds He was refusing  metoprolol   -Echo showed EF 20-25% (done while tachycardic), left atrium 54 mm -CHADS2VASC at least least 2 (CHF, age x 1)  converted with ablation  Elevated pressures obtained through echocardiogram, TEE D/c  on Lasix  40 daily  PMH:   has a past medical history of Atrial flutter (HCC), Chronic combined systolic and diastolic CHF (congestive heart failure) (HCC), CKD (chronic kidney disease), stage II-III, Essential hypertension, GI bleed, NICM (nonischemic cardiomyopathy) (HCC), Noncompliance with medications, PAF (paroxysmal atrial fibrillation) (HCC), Pulmonary hypertension (HCC), and Stroke (HCC).  PSH:    Past Surgical History:  Procedure Laterality Date   A-FLUTTER ABLATION N/A 06/16/2017   Procedure: A-FLUTTER ABLATION;  Surgeon: Waddell Danelle ORN, MD;  Location: [facility] INVASIVE CV LAB;  Service: Cardiovascular;  Laterality: N/A;   CARDIAC CATHETERIZATION     CARDIOVERSION N/A 05/01/2017   Procedure:  CARDIOVERSION;  Surgeon: Perla Evalene PARAS, MD;  Location: [facility] ORS;  Service: Cardiovascular;  Laterality: N/A;   COLONOSCOPY WITH PROPOFOL  N/A 04/07/2020   Procedure: COLONOSCOPY WITH PROPOFOL ;  Surgeon: Maryruth Ole DASEN, MD;  Location: [facility] ENDOSCOPY;   Service: Endoscopy;  Laterality: N/A;   CORONARY ANGIOPLASTY     ESOPHAGOGASTRODUODENOSCOPY N/A 04/07/2020   Procedure: ESOPHAGOGASTRODUODENOSCOPY (EGD);  Surgeon: Maryruth Ole DASEN, MD;  Location: [facility] ENDOSCOPY;  Service: Endoscopy;  Laterality: N/A;   ESOPHAGOGASTRODUODENOSCOPY (EGD) WITH PROPOFOL  N/A 02/09/2018   Procedure: ESOPHAGOGASTRODUODENOSCOPY (EGD) WITH PROPOFOL ;  Surgeon: Janalyn Keene NOVAK, MD;  Location: [facility] ENDOSCOPY;  Service: Endoscopy;  Laterality: N/A;   RIGHT/LEFT HEART CATH AND CORONARY ANGIOGRAPHY N/A 07/18/2017   Procedure: RIGHT/LEFT HEART CATH AND CORONARY ANGIOGRAPHY;  Surgeon: Darron Deatrice LABOR, MD;  Location: [facility] INVASIVE CV LAB;  Service: Cardiovascular;  Laterality: N/A;   TEE WITHOUT CARDIOVERSION N/A 05/01/2017   Procedure: TRANSESOPHAGEAL ECHOCARDIOGRAM (TEE);  Surgeon: Perla Evalene PARAS, MD;  Location: [facility] ORS;  Service: Cardiovascular;  Laterality: N/A;   TEE WITHOUT CARDIOVERSION N/A 06/16/2017   Procedure: TRANSESOPHAGEAL ECHOCARDIOGRAM (TEE);  Surgeon: Maranda Leim DEL, MD;  Location: [facility] ENDOSCOPY;  Service: Cardiovascular;  Laterality: N/A;    Current Outpatient Medications  Medication Sig Dispense Refill   acetaminophen  (TYLENOL ) 500 MG tablet Take 500-1,000 mg by mouth 2 (two) times daily as needed for mild pain or moderate pain.     atorvastatin  (LIPITOR ) 80 MG tablet Take 1 tablet (80 mg total) by mouth daily. 90 tablet 3   metoprolol  succinate (TOPROL -XL) 25 MG 24 hr tablet Take 1 tablet by mouth once daily 90 tablet 3   sacubitril -valsartan  (ENTRESTO ) 24-26 MG Take 1 tablet by mouth 2 (two) times daily. 180 tablet 3   spironolactone  (ALDACTONE ) 25 MG tablet Take 1 tablet by mouth once daily 90 tablet 3   tiZANidine  (ZANAFLEX ) 2 MG tablet TAKE 1 TABLET BY MOUTH THREE TIMES DAILY 90 tablet 0   No current facility-administered medications for this visit.    Allergies:   Patient has no known allergies.   Social History:  The patient  reports  that he quit smoking about 6 years ago. His smoking use included cigarettes. He started smoking about 9 years ago. He has a 3 pack-year smoking history. He has never used smokeless tobacco. He reports current alcohol use of about 3.0 standard drinks of alcohol per week. He reports that he does not use drugs.   Family History:   family history includes Alzheimer's disease in his father and mother.    Review of Systems: Review of Systems  Constitutional: Negative.   Respiratory: Negative.    Cardiovascular: Negative.   Gastrointestinal: Negative.   Musculoskeletal: Negative.   Neurological: Negative.   Psychiatric/Behavioral: Negative.    All other systems reviewed and are negative.   PHYSICAL EXAM: VS:  There were no vitals taken for this visit. , BMI There is no height or weight on file to calculate BMI.  Constitutional:  oriented to person, place, and time. No distress.  HENT:  Head: Grossly normal Eyes:  no discharge. No scleral icterus.  Neck: No JVD, no carotid bruits  Cardiovascular: Regular rate and rhythm, no murmurs appreciated Pulmonary/Chest: Clear to auscultation bilaterally, no wheezes or rails Abdominal: Soft.  no distension.  no tenderness.  Musculoskeletal: Normal range of motion Neurological:  normal muscle tone. Coordination normal. No atrophy Skin: Skin warm and dry Psychiatric: normal affect, pleasant  Recent Labs: 02/12/2023: Hemoglobin 11.9; Platelets 291 07/09/2023: ALT  12; BUN 21; Creatinine, Ser 1.66; Potassium 5.3; Sodium 137    Lipid Panel Lab Results  Component Value Date   CHOL 136 07/09/2023   HDL 76 07/09/2023   LDLCALC 49 07/09/2023   TRIG 51 07/09/2023      Wt Readings from Last 3 Encounters:  07/23/23 238 lb (108 kg)  06/05/23 234 lb (106.1 kg)  04/29/23 234 lb 4 oz (106.3 kg)     ASSESSMENT AND PLAN:  Atrial flutter with rapid ventricular response (HCC)  Prior ablation History of stroke, previously declined  anticoagulation Continue metoprolol  Discussion again concerning anticoagulation, risk and benefit  Declining anticoagulation Previously declined placement of Watchman device  History of stroke Reports residual left side hand weakness, left leg weakness chronic swelling left leg Walks with walker Declining anticoagulation or Watchman device  dilated cardiomyopathy /tachycardia mediated Maintaining normal sinus rhythm, ejection fraction normalized Ejection fraction has improved, echocardiogram 2022 Ejection fraction  55%  Not on Lasix   Acute renal failure Followed by primary care creatinine 1.6 up from his baseline Suspect prerenal state He reports primary care to recheck lab work every 3 months He will increase his fluids  Pulmonary HTN (HCC) - Plan: EKG 12-Lead Appears euvolemic, denies shortness of breath, no leg swelling  Acute on chronic combined systolic and diastolic CHF (congestive heart failure) (HCC) - Normal EF on echo June 2022 Improved function and normal sinus rhythm  Atrial fibrillation Maintaining normal sinus rhythm Intolerant of amiodarone  Declining anticoagulation Continue metoprolol  succinate 25 daily   Total encounter time more than 30 minutes  Greater than 50% was spent in counseling and coordination of care with the patient    No orders of the defined types were placed in this encounter.    Signed, Velinda Lunger, M.D., Ph.D. 09/12/2023  [facility] [place], [place] [phone]

## 2023-09-15 ENCOUNTER — Ambulatory Visit: Admitting: Cardiovascular Disease

## 2023-09-15 DIAGNOSIS — E782 Mixed hyperlipidemia: Secondary | ICD-10-CM

## 2023-09-15 DIAGNOSIS — I5032 Chronic diastolic (congestive) heart failure: Secondary | ICD-10-CM

## 2023-09-15 DIAGNOSIS — I10 Essential (primary) hypertension: Secondary | ICD-10-CM

## 2023-09-15 DIAGNOSIS — I428 Other cardiomyopathies: Secondary | ICD-10-CM

## 2023-09-15 DIAGNOSIS — I480 Paroxysmal atrial fibrillation: Secondary | ICD-10-CM

## 2023-09-15 DIAGNOSIS — I2510 Atherosclerotic heart disease of native coronary artery without angina pectoris: Secondary | ICD-10-CM

## 2023-09-15 DIAGNOSIS — D649 Anemia, unspecified: Secondary | ICD-10-CM

## 2023-09-15 DIAGNOSIS — I4892 Unspecified atrial flutter: Secondary | ICD-10-CM

## 2023-09-15 DIAGNOSIS — N183 Chronic kidney disease, stage 3 unspecified: Secondary | ICD-10-CM

## 2023-09-16 ENCOUNTER — Other Ambulatory Visit: Payer: Self-pay | Admitting: Cardiovascular Disease

## 2023-10-10 ENCOUNTER — Other Ambulatory Visit: Payer: Self-pay | Admitting: Cardiovascular Disease

## 2023-10-14 ENCOUNTER — Other Ambulatory Visit

## 2023-10-14 DIAGNOSIS — I10 Essential (primary) hypertension: Secondary | ICD-10-CM | POA: Diagnosis not present

## 2023-10-14 DIAGNOSIS — N1831 Chronic kidney disease, stage 3a: Secondary | ICD-10-CM | POA: Diagnosis not present

## 2023-10-14 DIAGNOSIS — E782 Mixed hyperlipidemia: Secondary | ICD-10-CM | POA: Diagnosis not present

## 2023-10-15 ENCOUNTER — Other Ambulatory Visit: Payer: Self-pay | Admitting: Internal Medicine

## 2023-10-15 DIAGNOSIS — M545 Low back pain, unspecified: Secondary | ICD-10-CM

## 2023-10-15 LAB — CBC WITH DIFF/PLATELET
Basophils Absolute: 0 x10E3/uL (ref 0.0–0.2)
Basos: 1 %
EOS (ABSOLUTE): 0.2 x10E3/uL (ref 0.0–0.4)
Eos: 3 %
Hematocrit: 33.6 % — ABNORMAL LOW (ref 37.5–51.0)
Hemoglobin: 10.7 g/dL — ABNORMAL LOW (ref 13.0–17.7)
Immature Grans (Abs): 0 x10E3/uL (ref 0.0–0.1)
Immature Granulocytes: 0 %
Lymphocytes Absolute: 1.6 x10E3/uL (ref 0.7–3.1)
Lymphs: 26 %
MCH: 28.2 pg (ref 26.6–33.0)
MCHC: 31.8 g/dL (ref 31.5–35.7)
MCV: 89 fL (ref 79–97)
Monocytes Absolute: 0.7 x10E3/uL (ref 0.1–0.9)
Monocytes: 12 %
Neutrophils Absolute: 3.7 x10E3/uL (ref 1.4–7.0)
Neutrophils: 58 %
Platelets: 242 x10E3/uL (ref 150–450)
RBC: 3.79 x10E6/uL — ABNORMAL LOW (ref 4.14–5.80)
RDW: 13.6 % (ref 11.6–15.4)
WBC: 6.2 x10E3/uL (ref 3.4–10.8)

## 2023-10-15 LAB — LIPID PANEL
Chol/HDL Ratio: 2.3 ratio (ref 0.0–5.0)
Cholesterol, Total: 114 mg/dL (ref 100–199)
HDL: 49 mg/dL (ref >39)
LDL Chol Calc (NIH): 48 mg/dL (ref 0–99)
Triglycerides: 89 mg/dL (ref 0–149)
VLDL Cholesterol Cal: 17 mg/dL (ref 5–40)

## 2023-10-15 LAB — BMP8+ANION GAP
Anion Gap: 15 mmol/L (ref 10.0–18.0)
BUN/Creatinine Ratio: 10 (ref 10–24)
BUN: 17 mg/dL (ref 8–27)
CO2: 15 mmol/L — ABNORMAL LOW (ref 20–29)
Calcium: 9.6 mg/dL (ref 8.6–10.2)
Chloride: 109 mmol/L — ABNORMAL HIGH (ref 96–106)
Creatinine, Ser: 1.69 mg/dL — ABNORMAL HIGH (ref 0.76–1.27)
Glucose: 92 mg/dL (ref 70–99)
Potassium: 4.6 mmol/L (ref 3.5–5.2)
Sodium: 139 mmol/L (ref 134–144)
eGFR: 43 mL/min/1.73 — ABNORMAL LOW (ref >59)

## 2023-10-16 ENCOUNTER — Other Ambulatory Visit: Payer: Self-pay

## 2023-10-16 DIAGNOSIS — M545 Low back pain, unspecified: Secondary | ICD-10-CM

## 2023-10-16 NOTE — Telephone Encounter (Signed)
 Pt called regarding refill please advise

## 2023-10-29 ENCOUNTER — Ambulatory Visit: Payer: Self-pay | Admitting: Internal Medicine

## 2023-10-29 ENCOUNTER — Ambulatory Visit (INDEPENDENT_AMBULATORY_CARE_PROVIDER_SITE_OTHER): Admitting: Internal Medicine

## 2023-10-29 ENCOUNTER — Encounter: Payer: Self-pay | Admitting: Internal Medicine

## 2023-10-29 VITALS — BP 115/70 | HR 74 | Temp 96.2°F | Ht 77.0 in | Wt 238.0 lb

## 2023-10-29 DIAGNOSIS — M545 Low back pain, unspecified: Secondary | ICD-10-CM | POA: Diagnosis not present

## 2023-10-29 DIAGNOSIS — G8929 Other chronic pain: Secondary | ICD-10-CM

## 2023-10-29 DIAGNOSIS — I10 Essential (primary) hypertension: Secondary | ICD-10-CM

## 2023-10-29 DIAGNOSIS — N1831 Chronic kidney disease, stage 3a: Secondary | ICD-10-CM

## 2023-10-29 DIAGNOSIS — E782 Mixed hyperlipidemia: Secondary | ICD-10-CM | POA: Diagnosis not present

## 2023-10-29 DIAGNOSIS — N400 Enlarged prostate without lower urinary tract symptoms: Secondary | ICD-10-CM

## 2023-10-29 DIAGNOSIS — I5022 Chronic systolic (congestive) heart failure: Secondary | ICD-10-CM | POA: Diagnosis not present

## 2023-10-29 MED ORDER — TIZANIDINE HCL 2 MG PO TABS: 2.0000 mg | ORAL_TABLET | Freq: Three times a day (TID) | ORAL | 0 refills | Status: DC

## 2023-10-29 NOTE — Progress Notes (Signed)
 Established Patient Office Visit  Subjective:  Patient ID: Nathan Minner., male    DOB: Jan 15, 1951  Age: 73 y.o. MRN: [id number]  Chief Complaint  Patient presents with   [name]    14 week     No new complaints, here for lab review and medication refills. Labs reviewed and notable for LDL and TC well controlled on lab review. Triglycerides also satisfactory while cmp notable for stable ckd 3.    No other concerns at this time.   Past Medical History:  Diagnosis Date   Atrial flutter (HCC)    a. Correll Denbow/p TEE/DCCV 04/2017; b. 06/2017 Dalinda Heidt/p RFCA; c. CHADS2VASc => 5 (CHF, HTN, age x 1, CVA)-->noncompliant with Xarelto .   Chronic combined systolic and diastolic CHF (congestive heart failure) (HCC)    a. TTE 2/19: EF 20-25%, diffuse HK; b. TTE 3/19: EF 20-25%, diffuse HK; c. 08/2017 Echo: EF 35-40%, diff HK. Gr1 DD; d. 06/2018 Echo: EF 40-45%, DD. Neg bubble study.   CKD (chronic kidney disease), stage II-III    Essential hypertension    GI bleed    a.  Hemorrhagic shock in 11/19 secondary to erosive gastropathy and Barrett'Audreanna Torrisi esophagus requiring multiple units PRBCs; b. Cleared to resume OAC->pt did not.   NICM (nonischemic cardiomyopathy) (HCC)    a. 04/2017: Echo EF 20-25%; b. TTE 3/19: EF 20-25%; c. 07/2017 Cath: min irregs; d. 08/2017 Echo: EF 35-40%, diff HK; e. 06/2018 Echo: EF 40-45%, DD.   Noncompliance with medications    PAF (paroxysmal atrial fibrillation) (HCC)    a. 07/2017 afib->broke w/ IV amio->converted to oral bb due to prior intol to oral amio.   Pulmonary hypertension (HCC)    Stroke (HCC)    a. 06/2018 MRI/A Brain: R paramedian pons late acute/early subacute infarct, 22mm. No assoc hemorrhage or mass effect.  Sev chronic microvascular isch changtes and mod voluem loss. No large vessel occlusion, aneurysm, or significant stenosis.    Past Surgical History:  Procedure Laterality Date   A-FLUTTER ABLATION N/A 06/16/2017   Procedure: A-FLUTTER ABLATION;  Surgeon: Waddell Danelle ORN, MD;  Location[facility] INVASIVE CV LAB;  Service: Cardiovascular;  Laterality: N/A;   CARDIAC CATHETERIZATION     CARDIOVERSION N/A 05/01/2017   Procedure: CARDIOVERSION;  Surgeon: Perla Evalene PARAS, MD;  Location: [facility] ORS;  Service: Cardiovascular;  Laterality: N/A;   COLONOSCOPY WITH PROPOFOL  N/A 04/07/2020   Procedure: COLONOSCOPY WITH PROPOFOL ;  Surgeon: Maryruth Ole DASEN, MD;  Location: [facility] ENDOSCOPY;  Service: Endoscopy;  Laterality: N/A;   CORONARY ANGIOPLASTY     ESOPHAGOGASTRODUODENOSCOPY N/A 04/07/2020   Procedure: ESOPHAGOGASTRODUODENOSCOPY (EGD);  Surgeon: Maryruth Ole DASEN, MD;  Location: [facility] ENDOSCOPY;  Service: Endoscopy;  Laterality: N/A;   ESOPHAGOGASTRODUODENOSCOPY (EGD) WITH PROPOFOL  N/A 02/09/2018   Procedure: ESOPHAGOGASTRODUODENOSCOPY (EGD) WITH PROPOFOL ;  Surgeon: Janalyn Keene NOVAK, MD;  Location: [facility] ENDOSCOPY;  Service: Endoscopy;  Laterality: N/A;   RIGHT/LEFT HEART CATH AND CORONARY ANGIOGRAPHY N/A 07/18/2017   Procedure: RIGHT/LEFT HEART CATH AND CORONARY ANGIOGRAPHY;  Surgeon: Darron Deatrice LABOR, MD;  Location: [facility] INVASIVE CV LAB;  Service: Cardiovascular;  Laterality: N/A;   TEE WITHOUT CARDIOVERSION N/A 05/01/2017   Procedure: TRANSESOPHAGEAL ECHOCARDIOGRAM (TEE);  Surgeon: Perla Evalene PARAS, MD;  Location: [facility] ORS;  Service: Cardiovascular;  Laterality: N/A;   TEE WITHOUT CARDIOVERSION N/A 06/16/2017   Procedure: TRANSESOPHAGEAL ECHOCARDIOGRAM (TEE);  Surgeon: Maranda Leim DEL, MD;  Location: [facility] ENDOSCOPY;  Service: Cardiovascular;  Laterality: N/A;    Social History   Socioeconomic History  Marital status: Single    Spouse name: Not on file   Number of children: 1   Years of education: Not on file   Highest education level: Not on file  Occupational History   Occupation: Cook    Comment: [employer]  Tobacco Use   Smoking status: Former    Current packs/day: 0.00    Average packs/day: 1 pack/day for 3.0 years (3.0 ttl pk-yrs)    Types:  Cigarettes    Start date: 04/21/2014    Quit date: 04/21/2017    Years since quitting: 6.5   Smokeless tobacco: Never  Vaping Use   Vaping status: Never Used  Substance and Sexual Activity   Alcohol use: Yes    Alcohol/week: 3.0 standard drinks of alcohol    Types: 3 Cans of beer per week    Comment: Drink a half of a 40oz beer every day, drank heavily in the past   Drug use: Never   Sexual activity: Yes    Partners: Female  Other Topics Concern   Not on file  Social History Narrative   Not on file   Social Drivers of Health   Financial Resource Strain: Patient Declined (04/25/2023)   Received from [facility]   Overall Financial Resource Strain (CARDIA)    Difficulty of Paying Living Expenses: Patient declined  Food Insecurity: Patient Declined (04/25/2023)   Received from [facility]   Hunger Vital Sign    Within the past 12 months, you worried that your food would run out before you got the money to buy more.: Patient declined    Within the past 12 months, the food you bought just didn't last and you didn't have money to get more.: Patient declined  Transportation Needs: Patient Declined (04/25/2023)   Received from [facility] - Transportation    In the past 12 months, has lack of transportation kept you from medical appointments or from getting medications?: Patient declined    Lack of Transportation (Non-Medical): Patient declined  Physical Activity: Insufficiently Active (06/15/2017)   Exercise Vital Sign    Days of Exercise per Week: 7 days    Minutes of Exercise per Session: 20 min  Stress: No Stress Concern Present (06/15/2017)   [employer] of Occupational Health - Occupational Stress Questionnaire    Feeling of Stress : Not at all  Social Connections: Somewhat Isolated (06/15/2017)   Social Connection and Isolation Panel    Frequency of Communication with Friends and Family: More than three times a week     Frequency of Social Gatherings with Friends and Family: Once a week    Attends Religious Services: 1 to 4 times per year    Active Member of [employer] or Organizations: No    Attends Banker Meetings: Never    Marital Status: Divorced  Catering manager Violence: Not At Risk (06/15/2017)   Humiliation, Afraid, Rape, and Kick questionnaire    Fear of Current or Ex-Partner: No    Emotionally Abused: No    Physically Abused: No    Sexually Abused: No    Family History  Problem Relation Age of Onset   Alzheimer'Keya Wynes disease Mother    Alzheimer'Raylynn Hersh disease Father     No Known Allergies  Outpatient Medications Prior to Visit  Medication Sig   acetaminophen  (TYLENOL ) 500 MG tablet Take 500-1,000 mg by mouth 2 (two) times daily as needed for mild pain or moderate pain.  atorvastatin  (LIPITOR ) 80 MG tablet Take 1 tablet by mouth once daily   metoprolol  succinate (TOPROL -XL) 25 MG 24 hr tablet Take 1 tablet by mouth once daily   sacubitril -valsartan  (ENTRESTO ) 24-26 MG Take 1 tablet by mouth twice daily   spironolactone  (ALDACTONE ) 25 MG tablet Take 1 tablet by mouth once daily   [DISCONTINUED] tiZANidine  (ZANAFLEX ) 2 MG tablet TAKE 1 TABLET BY MOUTH THREE TIMES DAILY   No facility-administered medications prior to visit.    Review of Systems  Constitutional: Negative.   HENT: Negative.    Eyes: Negative.   Respiratory: Negative.    Cardiovascular: Negative.   Gastrointestinal: Negative.   Genitourinary: Negative.   Skin: Negative.   Neurological: Negative.   Endo/Heme/Allergies: Negative.   Psychiatric/Behavioral: Negative.         Objective:   BP 115/70   Pulse 74   Temp (!) 96.2 F (35.7 C)   Ht 6' 5 (1.956 m)   Wt 238 lb (108 kg)   SpO2 96%   BMI 28.22 kg/m   Vitals:   10/29/23 0915  BP: 115/70  Pulse: 74  Temp: (!) 96.2 F (35.7 C)  Height: 6' 5 (1.956 m)  Weight: 238 lb (108 kg)  SpO2: 96%  BMI (Calculated): 28.22    Physical Exam Vitals  reviewed.  Constitutional:      Appearance: Normal appearance.  HENT:     Head: Normocephalic.     Left Ear: There is no impacted cerumen.     Nose: Nose normal.     Mouth/Throat:     Mouth: Mucous membranes are moist.     Pharynx: No posterior oropharyngeal erythema.  Eyes:     Extraocular Movements: Extraocular movements intact.     Pupils: Pupils are equal, round, and reactive to light.  Cardiovascular:     Rate and Rhythm: Regular rhythm.     Chest Wall: PMI is not displaced.     Pulses: Normal pulses.     Heart sounds: Normal heart sounds. No murmur heard. Pulmonary:     Effort: Pulmonary effort is normal.     Breath sounds: Normal air entry. Rhonchi present. No rales.  Abdominal:     General: Abdomen is flat. Bowel sounds are normal. There is no distension.     Palpations: Abdomen is soft. There is no hepatomegaly, splenomegaly or mass.     Tenderness: There is no abdominal tenderness.  Musculoskeletal:        General: Normal range of motion.     Cervical back: Normal range of motion and neck supple.     Right lower leg: No edema.     Left lower leg: No edema.  Skin:    General: Skin is warm and dry.  Neurological:     General: No focal deficit present.     Mental Status: He is alert and oriented to person, place, and time.     Cranial Nerves: Cranial nerve deficit present.     Motor: Weakness (left hemiparesis) present.     Gait: Gait abnormal (scissors gait).  Psychiatric:        Mood and Affect: Mood normal.        Behavior: Behavior normal.      No results found for any visits on 10/29/23.  Recent Results (from the past 2160 hours)  CBC With Diff/Platelet     Status: Abnormal   Collection Time: 10/14/23  9:17 AM  Result Value Ref Range   WBC 6.2 3.4 -  10.8 x10E3/uL   RBC 3.79 (L) 4.14 - 5.80 x10E6/uL   Hemoglobin 10.7 (L) 13.0 - 17.7 g/dL   Hematocrit 66.3 (L) 62.4 - 51.0 %   MCV 89 79 - 97 fL   MCH 28.2 26.6 - 33.0 pg   MCHC 31.8 31.5 - 35.7 g/dL    RDW 86.3 88.3 - 84.5 %   Platelets 242 150 - 450 x10E3/uL   Neutrophils 58 Not Estab. %   Lymphs 26 Not Estab. %   Monocytes 12 Not Estab. %   Eos 3 Not Estab. %   Basos 1 Not Estab. %   Neutrophils Absolute 3.7 1.4 - 7.0 x10E3/uL   Lymphocytes Absolute 1.6 0.7 - 3.1 x10E3/uL   Monocytes Absolute 0.7 0.1 - 0.9 x10E3/uL   EOS (ABSOLUTE) 0.2 0.0 - 0.4 x10E3/uL   Basophils Absolute 0.0 0.0 - 0.2 x10E3/uL   Immature Granulocytes 0 Not Estab. %   Immature Grans (Abs) 0.0 0.0 - 0.1 x10E3/uL  Lipid panel     Status: None   Collection Time: 10/14/23  9:17 AM  Result Value Ref Range   Cholesterol, Total 114 100 - 199 mg/dL   Triglycerides 89 0 - 149 mg/dL   HDL 49 >60 mg/dL   VLDL Cholesterol Cal 17 5 - 40 mg/dL   LDL Chol Calc (NIH) 48 0 - 99 mg/dL   Chol/HDL Ratio 2.3 0.0 - 5.0 ratio    Comment:                                   T. Chol/HDL Ratio                                             Men  Women                               1/2 Avg.Risk  3.4    3.3                                   Avg.Risk  5.0    4.4                                2X Avg.Risk  9.6    7.1                                3X Avg.Risk 23.4   11.0   BMP8+Anion Gap     Status: Abnormal   Collection Time: 10/14/23  9:17 AM  Result Value Ref Range   Glucose 92 70 - 99 mg/dL   BUN 17 8 - 27 mg/dL   Creatinine, Ser 8.30 (H) 0.76 - 1.27 mg/dL   eGFR 43 (L) >40 fO/fpw/8.26   BUN/Creatinine Ratio 10 10 - 24   Sodium 139 134 - 144 mmol/L   Potassium 4.6 3.5 - 5.2 mmol/L   Chloride 109 (H) 96 - 106 mmol/L   CO2 15 (L) 20 - 29 mmol/L    Comment: **Verified by repeat analysis**   Anion Gap 15.0 10.0 - 18.0  mmol/L   Calcium  9.6 8.6 - 10.2 mg/dL      Assessment & Plan:  Nathan [name].  Stage 3a chronic kidney disease (HCC) -     Ambulatory referral to Nephrology -     Comprehensive metabolic panel with GFR  Mixed hyperlipidemia -     CK -     Lipid panel; Standing -     Comprehensive  metabolic panel with GFR  Primary hypertension  Chronic systolic heart failure (HCC)  Chronic low back pain without sciatica, unspecified back pain laterality -     tiZANidine  HCl; Take 1 tablet (2 mg total) by mouth 3 (three) times daily.  Dispense: 270 tablet; Refill: 0  Benign prostatic hyperplasia without lower urinary tract symptoms -     PSA    Problem List Items Addressed This Visit       Cardiovascular and Mediastinum   Chronic systolic heart failure (HCC) (Chronic)   HTN (hypertension) (Chronic)     Genitourinary   CKD (chronic kidney disease), stage IIIa - Primary   Relevant Orders   Ambulatory referral to Nephrology   Comprehensive metabolic panel with GFR     Other   HLD (hyperlipidemia)   Relevant Orders   Lipid panel   Comprehensive metabolic panel with GFR   Chronic low back pain without sciatica   Relevant Medications   tiZANidine  (ZANAFLEX ) 2 MG tablet   Other Visit Diagnoses       Benign prostatic hyperplasia without lower urinary tract symptoms       Relevant Orders   PSA       Return for awv with labs prior.   Total time spent: 20 minutes  Sherrill Cinderella Perry, MD  10/29/2023   This document may have been prepared by [facility] Voice Recognition software and as such may include unintentional dictation errors.

## 2023-11-08 NOTE — Progress Notes (Unsigned)
 Cardiology Office Note  Date:  11/10/2023   ID:  Nathan [name]., DOB Jun 06, 1950, MRN [id number]  PCP:  Albina GORMAN Dine, MD   Chief Complaint  Patient presents with   12 month follow up     Patient denies chest pain or shortness of breath.     HPI:  73 y.o. male with past medical history of CAD tobacco abuse quit 04/28/17  Admission to the hospital 04/2017 with new onset atrial flutter with RVR,  elevated troponin,  acute combined systolic and diastolic CHF Ejection fraction 20-25% February 13th 2019 S/p TEE and cardioversion 05/01/2017 Back into atrial flutter, ejection fraction 20-25% Flutter ablation 06/16/2017 Was in atrial fibrillation on office visit with Dr. Fernande July 05 2017 Interval atrial fibrillation and developed a stroke 4/20 ,currently anticoagulated with Eliquis . Echo 06/2018: EF 40 to 45% Echo June 2022 EF 55% Echo in 2024: EF 60% Who presents for follow up of his cardiomyopathy, atrial flutter , history of cardioversion  Last seen in clinic by myself 6/24 Followed by CHF clinic, last seen 2/25  In follow-up today he presents in a wheelchair Reports that he uses a scooter when he goes shopping Uses a walker in the house No regular activity Has an aide who drives, He does not drive  Denies any tachycardia or palpitations concerning for arrhythmia Reports compliance with his medications  Denies leg swelling, not on Lasix  No chest pain or shortness of breath on exertion  Labs reviewed Total chol 114, LDL 48,  CR 1.66  Discussion again today concerning anticoagulation for atrial fibrillation/flutter and stroke prevention As on previous visits, he does not want blood thinners  I almost bled out on eliquis  On discussion of the Watchman device on previous visits and again today, he declines   He does not have blood pressure cuff or monitoring system at home  EKG personally reviewed by myself on todays visit EKG Interpretation Date/Time:  Monday  November 10 2023 08:28:32 EDT Ventricular Rate:  66 PR Interval:  134 QRS Duration:  78 QT Interval:  386 QTC Calculation: 404 R Axis:   7  Text Interpretation: Normal sinus rhythm Cannot rule out Anterior infarct , age undetermined When compared with ECG of 04-Apr-2020 12:15, No significant change was found Confirmed by Perla Lye [phone]) on 11/10/2023 8:34:21 AM   Other past medical history reviewed Echo 6/22  1. Left ventricular ejection fraction, by estimation, is 55 %. The left  ventricle has normal function. Unable to exclude hypokinesis of the basal  to mid inferior wall. There is mild left ventricular hypertrophy.   2. Right ventricular systolic function is normal. The right ventricular  size is normal.   3. The mitral valve is normal in structure. No evidence of mitral valve  regurgitation. No evidence of mitral stenosis.   hospital January 2022 symptomatic anemia, CHF Hemoglobin 7.4  s/p Transfuse 1 unit of blood which is ordered by ED physician. anemia panel, iron  saturation 8%,  CVA 06/2018: left side, left hand back 80%, not as strong   cardiac catheterization 07/18/2017 showing nonobstructive disease Heavy calcification markedly elevated left ventricular end-diastolic pressures Insisted on leaving the hospital 07/19/2017  Discharged in normal sinus rhythm Lasix  up to 40 twice a day with potassium 20 twice a day   atrial flutter with RVR with 2:1 AV block with heart rates in the 120s bpm Despite advnacing meds He was refusing  metoprolol   -Echo showed EF 20-25% (done while tachycardic), left atrium 54 mm -CHADS2VASC  at least least 2 (CHF, age x 1)  converted with ablation  Elevated pressures obtained through echocardiogram, TEE D/c  on Lasix  40 daily  PMH:   has a past medical history of Atrial flutter (HCC), Chronic combined systolic and diastolic CHF (congestive heart failure) (HCC), CKD (chronic kidney disease), stage II-III, Essential hypertension, GI  bleed, NICM (nonischemic cardiomyopathy) (HCC), Noncompliance with medications, PAF (paroxysmal atrial fibrillation) (HCC), Pulmonary hypertension (HCC), and Stroke (HCC).  PSH:    Past Surgical History:  Procedure Laterality Date   A-FLUTTER ABLATION N/A 06/16/2017   Procedure: A-FLUTTER ABLATION;  Surgeon: Waddell Danelle ORN, MD;  Location[facility] INVASIVE CV LAB;  Service: Cardiovascular;  Laterality: N/A;   CARDIAC CATHETERIZATION     CARDIOVERSION N/A 05/01/2017   Procedure: CARDIOVERSION;  Surgeon: Perla Evalene PARAS, MD;  Location: [facility] ORS;  Service: Cardiovascular;  Laterality: N/A;   COLONOSCOPY WITH PROPOFOL  N/A 04/07/2020   Procedure: COLONOSCOPY WITH PROPOFOL ;  Surgeon: Maryruth Ole DASEN, MD;  Location: [facility] ENDOSCOPY;  Service: Endoscopy;  Laterality: N/A;   CORONARY ANGIOPLASTY     ESOPHAGOGASTRODUODENOSCOPY N/A 04/07/2020   Procedure: ESOPHAGOGASTRODUODENOSCOPY (EGD);  Surgeon: Maryruth Ole DASEN, MD;  Location[facility] ENDOSCOPY;  Service: Endoscopy;  Laterality: N/A;   ESOPHAGOGASTRODUODENOSCOPY (EGD) WITH PROPOFOL  N/A 02/09/2018   Procedure: ESOPHAGOGASTRODUODENOSCOPY (EGD) WITH PROPOFOL ;  Surgeon: Janalyn Keene NOVAK, MD;  Location: [facility] ENDOSCOPY;  Service: Endoscopy;  Laterality: N/A;   RIGHT/LEFT HEART CATH AND CORONARY ANGIOGRAPHY N/A 07/18/2017   Procedure: RIGHT/LEFT HEART CATH AND CORONARY ANGIOGRAPHY;  Surgeon: Darron Deatrice LABOR, MD;  Location: [facility] INVASIVE CV LAB;  Service: Cardiovascular;  Laterality: N/A;   TEE WITHOUT CARDIOVERSION N/A 05/01/2017   Procedure: TRANSESOPHAGEAL ECHOCARDIOGRAM (TEE);  Surgeon: Perla Evalene PARAS, MD;  Location: [facility] ORS;  Service: Cardiovascular;  Laterality: N/A;   TEE WITHOUT CARDIOVERSION N/A 06/16/2017   Procedure: TRANSESOPHAGEAL ECHOCARDIOGRAM (TEE);  Surgeon: Maranda Leim DEL, MD;  Location: [facility] ENDOSCOPY;  Service: Cardiovascular;  Laterality: N/A;    Current Outpatient Medications  Medication Sig Dispense Refill   acetaminophen   (TYLENOL ) 500 MG tablet Take 500-1,000 mg by mouth 2 (two) times daily as needed for mild pain or moderate pain.     atorvastatin  (LIPITOR ) 80 MG tablet Take 1 tablet by mouth once daily 90 tablet 3   metoprolol  succinate (TOPROL -XL) 25 MG 24 hr tablet Take 1 tablet by mouth once daily 90 tablet 3   sacubitril -valsartan  (ENTRESTO ) 24-26 MG Take 1 tablet by mouth twice daily 180 tablet 3   spironolactone  (ALDACTONE ) 25 MG tablet Take 1 tablet by mouth once daily 90 tablet 3   tiZANidine  (ZANAFLEX ) 2 MG tablet Take 1 tablet (2 mg total) by mouth 3 (three) times daily. 270 tablet 0   No current facility-administered medications for this visit.    Allergies:   Patient has no known allergies.   Social History:  The patient  reports that he quit smoking about 6 years ago. His smoking use included cigarettes. He started smoking about 9 years ago. He has a 3 pack-year smoking history. He has never used smokeless tobacco. He reports current alcohol use of about 3.0 standard drinks of alcohol per week. He reports that he does not use drugs.   Family History:   family history includes Alzheimer's disease in his father and mother.    Review of Systems: Review of Systems  Constitutional: Negative.   Respiratory: Negative.    Cardiovascular: Negative.   Gastrointestinal: Negative.   Musculoskeletal: Negative.   Neurological: Negative.  Psychiatric/Behavioral: Negative.    All other systems reviewed and are negative.   PHYSICAL EXAM: VS:  BP (!) 100/58 (BP Location: Left Arm, Patient Position: Sitting, Cuff Size: Normal)   Pulse 66   Ht 6' 5 (1.956 m)   Wt 219 lb (99.3 kg)   SpO2 99%   BMI 25.97 kg/m  , BMI Body mass index is 25.97 kg/m.  Constitutional:  oriented to person, place, and time. No distress.  HENT:  Head: Grossly normal Eyes:  no discharge. No scleral icterus.  Neck: No JVD, no carotid bruits  Cardiovascular: Regular rate and rhythm, no murmurs appreciated Pulmonary/Chest:  Clear to auscultation bilaterally, no wheezes or rales Abdominal: Soft.  no distension.  no tenderness.  Musculoskeletal: Normal range of motion Neurological:  normal muscle tone. Coordination normal. No atrophy Skin: Skin warm and dry Psychiatric: normal affect, pleasant  Recent Labs: 07/09/2023: ALT 12 10/14/2023: BUN 17; Creatinine, Ser 1.69; Hemoglobin 10.7; Platelets 242; Potassium 4.6; Sodium 139   Lipid Panel Lab Results  Component Value Date   CHOL 114 10/14/2023   HDL 49 10/14/2023   LDLCALC 48 10/14/2023   TRIG 89 10/14/2023     Wt Readings from Last 3 Encounters:  11/10/23 219 lb (99.3 kg)  10/29/23 238 lb (108 kg)  07/23/23 238 lb (108 kg)     ASSESSMENT AND PLAN:  Atrial flutter with rapid ventricular response (HCC)  Prior ablation History of stroke, previously declined anticoagulation secondary to GI bleed Continue metoprolol  Discussion again concerning anticoagulation, risk and benefit  Declining anticoagulation, rectal bleeding He is again declining Watchman device  History of stroke Reports residual left side hand weakness, left leg weakness Walks with walker Declining anticoagulation or Watchman device  dilated cardiomyopathy /tachycardia mediated Maintaining normal sinus rhythm, ejection fraction normalized, 60% Not on Lasix  Could consider Farxiga/Jardiance given renal dysfunction, will defer to primary care  Chronic renal failure creatinine 1.6 up from his baseline  Pulmonary HTN (HCC) - Plan: EKG 12-Lead Appears euvolemic, no significant leg swelling  Acute on chronic combined systolic and diastolic CHF (congestive heart failure) (HCC) - Normal EF on echo June 2022 and 2024 Improved function in normal sinus rhythm  Atrial fibrillation Maintaining normal sinus rhythm Intolerant of amiodarone  Declining anticoagulation Continue metoprolol  succinate 25 daily As above we did discuss anticoagulation and Watchman device but he has declined  both   Orders Placed This Encounter  Procedures   EKG 12-Lead     Signed, Velinda Lunger, M.D., Ph.D. 11/10/2023  [facility] [place], [place] [phone]

## 2023-11-10 ENCOUNTER — Encounter: Payer: Self-pay | Admitting: Cardiovascular Disease

## 2023-11-10 ENCOUNTER — Ambulatory Visit: Attending: Cardiovascular Disease | Admitting: Cardiovascular Disease

## 2023-11-10 VITALS — BP 100/58 | HR 66 | Ht 77.0 in | Wt 219.0 lb

## 2023-11-10 DIAGNOSIS — N183 Chronic kidney disease, stage 3 unspecified: Secondary | ICD-10-CM | POA: Diagnosis not present

## 2023-11-10 DIAGNOSIS — I480 Paroxysmal atrial fibrillation: Secondary | ICD-10-CM | POA: Diagnosis not present

## 2023-11-10 DIAGNOSIS — I428 Other cardiomyopathies: Secondary | ICD-10-CM

## 2023-11-10 DIAGNOSIS — I5022 Chronic systolic (congestive) heart failure: Secondary | ICD-10-CM

## 2023-11-10 DIAGNOSIS — I4892 Unspecified atrial flutter: Secondary | ICD-10-CM | POA: Diagnosis not present

## 2023-11-10 DIAGNOSIS — D649 Anemia, unspecified: Secondary | ICD-10-CM

## 2023-11-10 DIAGNOSIS — I2510 Atherosclerotic heart disease of native coronary artery without angina pectoris: Secondary | ICD-10-CM | POA: Diagnosis not present

## 2023-11-10 DIAGNOSIS — I5032 Chronic diastolic (congestive) heart failure: Secondary | ICD-10-CM | POA: Diagnosis not present

## 2023-11-10 DIAGNOSIS — I10 Essential (primary) hypertension: Secondary | ICD-10-CM

## 2023-11-10 DIAGNOSIS — E782 Mixed hyperlipidemia: Secondary | ICD-10-CM

## 2023-11-10 NOTE — Patient Instructions (Signed)
 Look for pulse oximeter to track heart rate   Medication Instructions:  No changes  If you need a refill on your cardiac medications before your next appointment, please call your pharmacy.   Lab work: No new labs needed  Testing/Procedures: No new testing needed  Follow-Up: At [facility], you and your health needs are our priority.  As part of our continuing mission to provide you with exceptional heart care, we have created designated Provider Care Teams.  These Care Teams include your primary Cardiologist (physician) and Advanced Practice Providers (APPs -  Physician Assistants and Nurse Practitioners) who all work together to provide you with the care you need, when you need it.  You will need a follow up appointment in 12 months  Providers on your designated Care Team:   Lonni Meager, NP Bernardino Bring, PA-C Cadence [place], [place]  COVID-19 Vaccine Information can be found at: [email or web address] For questions related to vaccine distribution or appointments, please email [email or web address] or call [phone].

## 2023-12-22 DIAGNOSIS — M79675 Pain in left toe(s): Secondary | ICD-10-CM | POA: Diagnosis not present

## 2023-12-22 DIAGNOSIS — M2012 Hallux valgus (acquired), left foot: Secondary | ICD-10-CM | POA: Diagnosis not present

## 2023-12-22 DIAGNOSIS — I739 Peripheral vascular disease, unspecified: Secondary | ICD-10-CM | POA: Diagnosis not present

## 2023-12-22 DIAGNOSIS — M2041 Other hammer toe(s) (acquired), right foot: Secondary | ICD-10-CM | POA: Diagnosis not present

## 2023-12-22 DIAGNOSIS — M2142 Flat foot [pes planus] (acquired), left foot: Secondary | ICD-10-CM | POA: Diagnosis not present

## 2023-12-22 DIAGNOSIS — M2141 Flat foot [pes planus] (acquired), right foot: Secondary | ICD-10-CM | POA: Diagnosis not present

## 2023-12-22 DIAGNOSIS — L03116 Cellulitis of left lower limb: Secondary | ICD-10-CM | POA: Diagnosis not present

## 2023-12-22 DIAGNOSIS — M79674 Pain in right toe(s): Secondary | ICD-10-CM | POA: Diagnosis not present

## 2023-12-22 DIAGNOSIS — M2011 Hallux valgus (acquired), right foot: Secondary | ICD-10-CM | POA: Diagnosis not present

## 2023-12-22 DIAGNOSIS — L97521 Non-pressure chronic ulcer of other part of left foot limited to breakdown of skin: Secondary | ICD-10-CM | POA: Diagnosis not present

## 2023-12-22 DIAGNOSIS — B351 Tinea unguium: Secondary | ICD-10-CM | POA: Diagnosis not present

## 2023-12-22 DIAGNOSIS — M2042 Other hammer toe(s) (acquired), left foot: Secondary | ICD-10-CM | POA: Diagnosis not present

## 2024-01-02 ENCOUNTER — Telehealth: Payer: Self-pay

## 2024-01-02 NOTE — Telephone Encounter (Signed)
 [place] asked that we call her once the CAP paperwork was faxed to [facility] so she can come get a copy, tried to call her but no VM picked up. I have the paperwork in an envelope at the downstairs nurses station

## 2024-01-06 NOTE — Telephone Encounter (Signed)
[place] informed

## 2024-01-07 ENCOUNTER — Other Ambulatory Visit

## 2024-01-07 DIAGNOSIS — E782 Mixed hyperlipidemia: Secondary | ICD-10-CM | POA: Diagnosis not present

## 2024-01-07 DIAGNOSIS — N1831 Chronic kidney disease, stage 3a: Secondary | ICD-10-CM | POA: Diagnosis not present

## 2024-01-08 LAB — COMPREHENSIVE METABOLIC PANEL WITH GFR
ALT: 8 IU/L (ref 0–44)
AST: 12 IU/L (ref 0–40)
Albumin: 3.7 g/dL — ABNORMAL LOW (ref 3.8–4.8)
Alkaline Phosphatase: 69 IU/L (ref 47–123)
BUN/Creatinine Ratio: 9 — ABNORMAL LOW (ref 10–24)
BUN: 12 mg/dL (ref 8–27)
Bilirubin Total: 0.4 mg/dL (ref 0.0–1.2)
CO2: 19 mmol/L — ABNORMAL LOW (ref 20–29)
Calcium: 9.6 mg/dL (ref 8.6–10.2)
Chloride: 99 mmol/L (ref 96–106)
Creatinine, Ser: 1.3 mg/dL — ABNORMAL HIGH (ref 0.76–1.27)
Globulin, Total: 3.7 g/dL (ref 1.5–4.5)
Glucose: 77 mg/dL (ref 70–99)
Potassium: 4.9 mmol/L (ref 3.5–5.2)
Sodium: 134 mmol/L (ref 134–144)
Total Protein: 7.4 g/dL (ref 6.0–8.5)
eGFR: 58 mL/min/1.73 — ABNORMAL LOW (ref >59)

## 2024-01-08 LAB — CK: Total CK: 81 U/L (ref 41–331)

## 2024-01-08 LAB — PSA: Prostate Specific Ag, Serum: 69.5 ng/mL — ABNORMAL HIGH (ref 0.0–4.0)

## 2024-01-12 ENCOUNTER — Telehealth: Payer: Self-pay

## 2024-01-12 NOTE — Telephone Encounter (Signed)
 Nathan [name] asking for call back since the patients CAP services was denied and she has some questions. [phone]

## 2024-01-14 ENCOUNTER — Telehealth: Payer: Self-pay

## 2024-01-14 NOTE — Telephone Encounter (Signed)
 Pt needs update if paperwork has been fixed. They talked to the nurse earlier that there is missing info and codes were not put in correct. Along with this, the no needs to be changed to yes for correct level of care. After changes are made it needs to be faxed back.

## 2024-01-19 NOTE — Telephone Encounter (Signed)
 This has been faxed back with the one correction made per their request

## 2024-01-20 ENCOUNTER — Ambulatory Visit (INDEPENDENT_AMBULATORY_CARE_PROVIDER_SITE_OTHER): Admitting: Internal Medicine

## 2024-01-20 ENCOUNTER — Encounter: Payer: Self-pay | Admitting: Internal Medicine

## 2024-01-20 VITALS — BP 118/75 | HR 80 | Temp 98.0°F | Ht 77.0 in | Wt 220.2 lb

## 2024-01-20 DIAGNOSIS — R972 Elevated prostate specific antigen [PSA]: Secondary | ICD-10-CM

## 2024-01-20 DIAGNOSIS — Z739 Problem related to life management difficulty, unspecified: Secondary | ICD-10-CM

## 2024-01-20 DIAGNOSIS — I5022 Chronic systolic (congestive) heart failure: Secondary | ICD-10-CM

## 2024-01-20 DIAGNOSIS — Z8489 Family history of other specified conditions: Secondary | ICD-10-CM | POA: Diagnosis not present

## 2024-01-20 DIAGNOSIS — Z1331 Encounter for screening for depression: Secondary | ICD-10-CM

## 2024-01-20 DIAGNOSIS — Z1389 Encounter for screening for other disorder: Secondary | ICD-10-CM | POA: Diagnosis not present

## 2024-01-20 DIAGNOSIS — I69352 Hemiplegia and hemiparesis following cerebral infarction affecting left dominant side: Secondary | ICD-10-CM | POA: Diagnosis not present

## 2024-01-20 DIAGNOSIS — Z0001 Encounter for general adult medical examination with abnormal findings: Secondary | ICD-10-CM | POA: Diagnosis not present

## 2024-01-20 DIAGNOSIS — N1831 Chronic kidney disease, stage 3a: Secondary | ICD-10-CM

## 2024-01-20 DIAGNOSIS — I639 Cerebral infarction, unspecified: Secondary | ICD-10-CM | POA: Diagnosis not present

## 2024-01-20 DIAGNOSIS — Z0130 Encounter for examination of blood pressure without abnormal findings: Secondary | ICD-10-CM

## 2024-01-20 DIAGNOSIS — E782 Mixed hyperlipidemia: Secondary | ICD-10-CM | POA: Diagnosis not present

## 2024-01-20 NOTE — Progress Notes (Signed)
 Established Patient Office Visit  Subjective:  Patient ID: Nathan [name]., male    DOB: 1950/04/24  Age: 73 y.o. MRN: [id number]  Chief Complaint  Patient presents with   Annual Exam    AWV lab results    No new complaints, here for AWV refer to quality metrics and scanned documents.  Also here for lab review and medication refills. Renal function improved on lab review while psa still elevated. Referred to Urology but failed to keep appts after initial visit.     No other concerns at this time.   Past Medical History:  Diagnosis Date   Atrial flutter (HCC)    a. Nickie Deren/p TEE/DCCV 04/2017; b. 06/2017 Sedonia Kitner/p RFCA; c. CHADS2VASc => 5 (CHF, HTN, age x 1, CVA)-->noncompliant with Xarelto .   Chronic combined systolic and diastolic CHF (congestive heart failure) (HCC)    a. TTE 2/19: EF 20-25%, diffuse HK; b. TTE 3/19: EF 20-25%, diffuse HK; c. 08/2017 Echo: EF 35-40%, diff HK. Gr1 DD; d. 06/2018 Echo: EF 40-45%, DD. Neg bubble study.   CKD (chronic kidney disease), stage II-III    Essential hypertension    GI bleed    a.  Hemorrhagic shock in 11/19 secondary to erosive gastropathy and Barrett'Delisia Mcquiston esophagus requiring multiple units PRBCs; b. Cleared to resume OAC->pt did not.   NICM (nonischemic cardiomyopathy) (HCC)    a. 04/2017: Echo EF 20-25%; b. TTE 3/19: EF 20-25%; c. 07/2017 Cath: min irregs; d. 08/2017 Echo: EF 35-40%, diff HK; e. 06/2018 Echo: EF 40-45%, DD.   Noncompliance with medications    PAF (paroxysmal atrial fibrillation) (HCC)    a. 07/2017 afib->broke w/ IV amio->converted to oral bb due to prior intol to oral amio.   Pulmonary hypertension (HCC)    Stroke (HCC)    a. 06/2018 MRI/A Brain: R paramedian pons late acute/early subacute infarct, 22mm. No assoc hemorrhage or mass effect.  Sev chronic microvascular isch changtes and mod voluem loss. No large vessel occlusion, aneurysm, or significant stenosis.    Past Surgical History:  Procedure Laterality Date   A-FLUTTER ABLATION N/A  06/16/2017   Procedure: A-FLUTTER ABLATION;  Surgeon: Waddell Danelle ORN, MD;  Location: [facility] INVASIVE CV LAB;  Service: Cardiovascular;  Laterality: N/A;   CARDIAC CATHETERIZATION     CARDIOVERSION N/A 05/01/2017   Procedure: CARDIOVERSION;  Surgeon: Perla Evalene PARAS, MD;  Location: [facility] ORS;  Service: Cardiovascular;  Laterality: N/A;   COLONOSCOPY WITH PROPOFOL  N/A 04/07/2020   Procedure: COLONOSCOPY WITH PROPOFOL ;  Surgeon: Maryruth Ole DASEN, MD;  Location: [facility] ENDOSCOPY;  Service: Endoscopy;  Laterality: N/A;   CORONARY ANGIOPLASTY     ESOPHAGOGASTRODUODENOSCOPY N/A 04/07/2020   Procedure: ESOPHAGOGASTRODUODENOSCOPY (EGD);  Surgeon: Maryruth Ole DASEN, MD;  Location: [facility] ENDOSCOPY;  Service: Endoscopy;  Laterality: N/A;   ESOPHAGOGASTRODUODENOSCOPY (EGD) WITH PROPOFOL  N/A 02/09/2018   Procedure: ESOPHAGOGASTRODUODENOSCOPY (EGD) WITH PROPOFOL ;  Surgeon: Janalyn Keene NOVAK, MD;  Location: [facility] ENDOSCOPY;  Service: Endoscopy;  Laterality: N/A;   RIGHT/LEFT HEART CATH AND CORONARY ANGIOGRAPHY N/A 07/18/2017   Procedure: RIGHT/LEFT HEART CATH AND CORONARY ANGIOGRAPHY;  Surgeon: Darron Deatrice LABOR, MD;  Location: [facility] INVASIVE CV LAB;  Service: Cardiovascular;  Laterality: N/A;   TEE WITHOUT CARDIOVERSION N/A 05/01/2017   Procedure: TRANSESOPHAGEAL ECHOCARDIOGRAM (TEE);  Surgeon: Perla Evalene PARAS, MD;  Location: [facility] ORS;  Service: Cardiovascular;  Laterality: N/A;   TEE WITHOUT CARDIOVERSION N/A 06/16/2017   Procedure: TRANSESOPHAGEAL ECHOCARDIOGRAM (TEE);  Surgeon: Maranda Leim DEL, MD;  Location: [facility] ENDOSCOPY;  Service: Cardiovascular;  Laterality: N/A;    Social History   Socioeconomic History   Marital status: Single    Spouse name: Not on file   Number of children: 1   Years of education: Not on file   Highest education level: Not on file  Occupational History   Occupation: Cook    Comment: [employer]  Tobacco Use   Smoking status: Former    Current packs/day: 0.00    Average  packs/day: 1 pack/day for 3.0 years (3.0 ttl pk-yrs)    Types: Cigarettes    Start date: 04/21/2014    Quit date: 04/21/2017    Years since quitting: 6.7   Smokeless tobacco: Never  Vaping Use   Vaping status: Never Used  Substance and Sexual Activity   Alcohol use: Yes    Alcohol/week: 3.0 standard drinks of alcohol    Types: 3 Cans of beer per week    Comment: Drink a half of a 40oz beer every day, drank heavily in the past   Drug use: Never   Sexual activity: Yes    Partners: Female  Other Topics Concern   Not on file  Social History Narrative   Not on file   Social Drivers of Health   Financial Resource Strain: Patient Declined (04/25/2023)   Received [facility]   Overall Financial Resource Strain (CARDIA)    Difficulty of Paying Living Expenses: Patient declined  Food Insecurity: Patient Declined (04/25/2023)   Received from [facility]   Hunger Vital Sign    Within the past 12 months, you worried that your food would run out before you got the money to buy more.: Patient declined    Within the past 12 months, the food you bought just didn't last and you didn't have money to get more.: Patient declined  Transportation Needs: Patient Declined (04/25/2023)   Received from [facility] - Transportation    In the past 12 months, has lack of transportation kept you from medical appointments or from getting medications?: Patient declined    Lack of Transportation (Non-Medical): Patient declined  Physical Activity: Insufficiently Active (06/15/2017)   Exercise Vital Sign    Days of Exercise per Week: 7 days    Minutes of Exercise per Session: 20 min  Stress: No Stress Concern Present (06/15/2017)   [employer] - Occupational Stress Questionnaire    Feeling of Stress : Not at all  Social Connections: Somewhat Isolated (06/15/2017)   Social Connection and Isolation Panel    Frequency of  Communication with Friends and Family: More than three times a week    Frequency of Social Gatherings with Friends and Family: Once a week    Attends Religious Services: 1 to 4 times per year    Active Member of [employer] or Organizations: No    Attends Banker Meetings: Never    Marital Status: Divorced  Catering Manager Violence: Not At Risk (06/15/2017)   Humiliation, Afraid, Rape, and Kick questionnaire    Fear of Current or Ex-Partner: No    Emotionally Abused: No    Physically Abused: No    Sexually Abused: No    Family History  Problem Relation Age of Onset   Alzheimer'Trust Leh disease Mother    Alzheimer'Zacharie Portner disease Father     No Known Allergies  Outpatient Medications Prior to Visit  Medication Sig   acetaminophen  (TYLENOL ) 500 MG tablet Take 500-1,000 mg by mouth 2 (  two) times daily as needed for mild pain or moderate pain.   atorvastatin  (LIPITOR ) 80 MG tablet Take 1 tablet by mouth once daily   metoprolol  succinate (TOPROL -XL) 25 MG 24 hr tablet Take 1 tablet by mouth once daily   sacubitril -valsartan  (ENTRESTO ) 24-26 MG Take 1 tablet by mouth twice daily   spironolactone  (ALDACTONE ) 25 MG tablet Take 1 tablet by mouth once daily   tiZANidine  (ZANAFLEX ) 2 MG tablet Take 1 tablet (2 mg total) by mouth 3 (three) times daily.   No facility-administered medications prior to visit.    Review of Systems  Constitutional: Negative.  Negative for weight loss (gained 1 lb).  HENT: Negative.    Eyes: Negative.   Respiratory: Negative.    Cardiovascular: Negative.   Gastrointestinal: Negative.   Genitourinary: Negative.   Skin: Negative.   Neurological: Negative.   Endo/Heme/Allergies: Negative.   Psychiatric/Behavioral: Negative.         Objective:   BP 118/75   Pulse 80   Temp 98 F (36.7 C)   Ht 6' 5 (1.956 m)   Wt 220 lb 3.2 oz (99.9 kg)   SpO2 97%   BMI 26.11 kg/m   Vitals:   01/20/24 0957  BP: 118/75  Pulse: 80  Temp: 98 F (36.7 C)  Height:  6' 5 (1.956 m)  Weight: 220 lb 3.2 oz (99.9 kg)  SpO2: 97%  BMI (Calculated): 26.11    Physical Exam Vitals reviewed.  Constitutional:      Appearance: Normal appearance.  HENT:     Head: Normocephalic.     Left Ear: There is no impacted cerumen.     Nose: Nose normal.     Mouth/Throat:     Mouth: Mucous membranes are moist.     Pharynx: No posterior oropharyngeal erythema.  Eyes:     Extraocular Movements: Extraocular movements intact.     Pupils: Pupils are equal, round, and reactive to light.  Cardiovascular:     Rate and Rhythm: Regular rhythm.     Chest Wall: PMI is not displaced.     Pulses: Normal pulses.     Heart sounds: Normal heart sounds. No murmur heard. Pulmonary:     Effort: Pulmonary effort is normal.     Breath sounds: Normal air entry. Rhonchi present. No rales.  Abdominal:     General: Abdomen is flat. Bowel sounds are normal. There is no distension.     Palpations: Abdomen is soft. There is no hepatomegaly, splenomegaly or mass.     Tenderness: There is no abdominal tenderness.  Musculoskeletal:        General: Normal range of motion.     Cervical back: Normal range of motion and neck supple.     Right lower leg: No edema.     Left lower leg: No edema.  Skin:    General: Skin is warm and dry.  Neurological:     General: No focal deficit present.     Mental Status: He is alert and oriented to person, place, and time.     Cranial Nerves: Cranial nerve deficit present.     Motor: Weakness (left hemiparesis) present.     Gait: Gait abnormal (scissors gait).  Psychiatric:        Mood and Affect: Mood normal.        Behavior: Behavior normal.      No results found for any visits on 01/20/24.  Recent Results (from the past 2160 hours)  Comprehensive metabolic panel  with GFR     Status: Abnormal   Collection Time: 01/07/24  9:44 AM  Result Value Ref Range   Glucose 77 70 - 99 mg/dL   BUN 12 8 - 27 mg/dL   Creatinine, Ser 8.69 (H) 0.76 - 1.27  mg/dL   eGFR 58 (L) >40 fO/fpw/8.26   BUN/Creatinine Ratio 9 (L) 10 - 24   Sodium 134 134 - 144 mmol/L   Potassium 4.9 3.5 - 5.2 mmol/L   Chloride 99 96 - 106 mmol/L   CO2 19 (L) 20 - 29 mmol/L   Calcium  9.6 8.6 - 10.2 mg/dL   Total Protein 7.4 6.0 - 8.5 g/dL   Albumin 3.7 (L) 3.8 - 4.8 g/dL   Globulin, Total 3.7 1.5 - 4.5 g/dL   Bilirubin Total 0.4 0.0 - 1.2 mg/dL   Alkaline Phosphatase 69 47 - 123 IU/L   AST 12 0 - 40 IU/L   ALT 8 0 - 44 IU/L  PSA     Status: Abnormal   Collection Time: 01/07/24  9:44 AM  Result Value Ref Range   Prostate Specific Ag, Serum 69.5 (H) 0.0 - 4.0 ng/mL    Comment: Roche ECLIA methodology. According to the American Urological Association, Serum PSA should decrease and remain at undetectable levels after radical prostatectomy. The AUA defines biochemical recurrence as an initial PSA value 0.2 ng/mL or greater followed by a subsequent confirmatory PSA value 0.2 ng/mL or greater. Values obtained with different assay methods or kits cannot be used interchangeably. Results cannot be interpreted as absolute evidence of the presence or absence of malignant disease.   CK     Status: None   Collection Time: 01/07/24  9:49 AM  Result Value Ref Range   Total CK 81 41 - 331 U/L      Assessment & Plan:  Xaden was seen today for annual exam.  Elevated PSA  Mixed hyperlipidemia -     Lipid panel  Stage 3a chronic kidney disease (HCC) -     Comprehensive metabolic panel with GFR  Chronic systolic heart failure (HCC)   Declines further evaluation of psa even though explained to him that it could be due to prostate Ca. Problem List Items Addressed This Visit       Cardiovascular and Mediastinum   Chronic systolic heart failure (HCC) (Chronic)     Genitourinary   CKD (chronic kidney disease), stage IIIa   Relevant Orders   Comprehensive metabolic panel with GFR     Other   HLD (hyperlipidemia)   Elevated PSA - Primary    Return in about  3 months (around 04/21/2024) for fu with labs prior.   Total time spent: 35 minutes. This time includes review of previous notes and results and patient face to face interaction during today'Dalton Molesworth visit.    Sherrill Cinderella Perry, MD  01/20/2024   This document may have been prepared by [facility] Voice Recognition software and as such may include unintentional dictation errors.

## 2024-03-15 ENCOUNTER — Other Ambulatory Visit: Payer: Self-pay | Admitting: Internal Medicine

## 2024-03-15 DIAGNOSIS — G8929 Other chronic pain: Secondary | ICD-10-CM

## 2024-05-19 ENCOUNTER — Ambulatory Visit: Admitting: Internal Medicine
